# Patient Record
Sex: Male | Born: 1955 | Race: White | Hispanic: No | Marital: Single | Smoking: Former smoker
Health system: Southern US, Community
[De-identification: ages and names within clinical notes are randomized; demographics above are authoritative.]

## PROBLEM LIST (undated history)

## (undated) DIAGNOSIS — R519 Headache, unspecified: Secondary | ICD-10-CM

## (undated) DIAGNOSIS — IMO0002 Reserved for concepts with insufficient information to code with codable children: Secondary | ICD-10-CM

## (undated) DIAGNOSIS — E118 Type 2 diabetes mellitus with unspecified complications: Secondary | ICD-10-CM

## (undated) DIAGNOSIS — J42 Unspecified chronic bronchitis: Secondary | ICD-10-CM

## (undated) DIAGNOSIS — E785 Hyperlipidemia, unspecified: Secondary | ICD-10-CM

## (undated) DIAGNOSIS — H269 Unspecified cataract: Secondary | ICD-10-CM

## (undated) DIAGNOSIS — I10 Essential (primary) hypertension: Secondary | ICD-10-CM

## (undated) DIAGNOSIS — R51 Headache: Secondary | ICD-10-CM

## (undated) DIAGNOSIS — R9430 Abnormal result of cardiovascular function study, unspecified: Secondary | ICD-10-CM

## (undated) DIAGNOSIS — I219 Acute myocardial infarction, unspecified: Secondary | ICD-10-CM

## (undated) DIAGNOSIS — J45909 Unspecified asthma, uncomplicated: Secondary | ICD-10-CM

## (undated) DIAGNOSIS — K3184 Gastroparesis: Secondary | ICD-10-CM

## (undated) DIAGNOSIS — N529 Male erectile dysfunction, unspecified: Secondary | ICD-10-CM

## (undated) DIAGNOSIS — I2510 Atherosclerotic heart disease of native coronary artery without angina pectoris: Secondary | ICD-10-CM

## (undated) DIAGNOSIS — G4733 Obstructive sleep apnea (adult) (pediatric): Secondary | ICD-10-CM

## (undated) HISTORY — DX: Abnormal result of cardiovascular function study, unspecified: R94.30

## (undated) HISTORY — DX: Type 2 diabetes mellitus with unspecified complications: E11.8

## (undated) HISTORY — DX: Atherosclerotic heart disease of native coronary artery without angina pectoris: I25.10

## (undated) HISTORY — DX: Obstructive sleep apnea (adult) (pediatric): G47.33

## (undated) HISTORY — DX: Hyperlipidemia, unspecified: E78.5

## (undated) HISTORY — DX: Male erectile dysfunction, unspecified: N52.9

## (undated) HISTORY — PX: CORONARY ANGIOPLASTY WITH STENT PLACEMENT: SHX49

## (undated) HISTORY — DX: Essential (primary) hypertension: I10

## (undated) HISTORY — DX: Unspecified cataract: H26.9

## (undated) HISTORY — DX: Reserved for concepts with insufficient information to code with codable children: IMO0002

---

## 1997-12-23 ENCOUNTER — Encounter: Admission: RE | Admit: 1997-12-23 | Discharge: 1997-12-23 | Payer: Self-pay | Admitting: Internal Medicine

## 1998-01-06 ENCOUNTER — Encounter: Admission: RE | Admit: 1998-01-06 | Discharge: 1998-01-06 | Payer: Self-pay | Admitting: Internal Medicine

## 1998-03-06 ENCOUNTER — Encounter: Admission: RE | Admit: 1998-03-06 | Discharge: 1998-03-06 | Payer: Self-pay | Admitting: Internal Medicine

## 1998-03-24 ENCOUNTER — Ambulatory Visit (HOSPITAL_COMMUNITY): Admission: RE | Admit: 1998-03-24 | Discharge: 1998-03-24 | Payer: Self-pay | Admitting: *Deleted

## 1998-05-23 ENCOUNTER — Encounter: Admission: RE | Admit: 1998-05-23 | Discharge: 1998-05-23 | Payer: Self-pay | Admitting: Internal Medicine

## 1998-06-14 ENCOUNTER — Encounter: Admission: RE | Admit: 1998-06-14 | Discharge: 1998-06-14 | Payer: Self-pay | Admitting: Internal Medicine

## 1998-08-17 ENCOUNTER — Encounter: Admission: RE | Admit: 1998-08-17 | Discharge: 1998-08-17 | Payer: Self-pay | Admitting: Internal Medicine

## 1998-08-23 ENCOUNTER — Encounter: Admission: RE | Admit: 1998-08-23 | Discharge: 1998-08-23 | Payer: Self-pay | Admitting: Hematology and Oncology

## 1998-10-30 ENCOUNTER — Encounter: Admission: RE | Admit: 1998-10-30 | Discharge: 1998-10-30 | Payer: Self-pay | Admitting: Internal Medicine

## 1998-11-01 ENCOUNTER — Emergency Department (HOSPITAL_COMMUNITY): Admission: EM | Admit: 1998-11-01 | Discharge: 1998-11-01 | Payer: Self-pay | Admitting: Emergency Medicine

## 1998-11-08 ENCOUNTER — Ambulatory Visit (HOSPITAL_COMMUNITY): Admission: RE | Admit: 1998-11-08 | Discharge: 1998-11-08 | Payer: Self-pay | Admitting: Internal Medicine

## 1998-11-08 ENCOUNTER — Encounter: Admission: RE | Admit: 1998-11-08 | Discharge: 1998-11-08 | Payer: Self-pay | Admitting: Internal Medicine

## 1998-11-13 ENCOUNTER — Encounter: Admission: RE | Admit: 1998-11-13 | Discharge: 1998-11-13 | Payer: Self-pay | Admitting: Internal Medicine

## 1998-11-15 ENCOUNTER — Ambulatory Visit (HOSPITAL_COMMUNITY): Admission: RE | Admit: 1998-11-15 | Discharge: 1998-11-15 | Payer: Self-pay | Admitting: *Deleted

## 1999-02-12 ENCOUNTER — Encounter: Admission: RE | Admit: 1999-02-12 | Discharge: 1999-02-12 | Payer: Self-pay | Admitting: Internal Medicine

## 1999-08-06 ENCOUNTER — Encounter: Admission: RE | Admit: 1999-08-06 | Discharge: 1999-08-06 | Payer: Self-pay | Admitting: Internal Medicine

## 2000-02-06 ENCOUNTER — Encounter: Payer: Self-pay | Admitting: Emergency Medicine

## 2000-02-06 ENCOUNTER — Emergency Department (HOSPITAL_COMMUNITY): Admission: EM | Admit: 2000-02-06 | Discharge: 2000-02-06 | Payer: Self-pay | Admitting: Emergency Medicine

## 2000-04-18 ENCOUNTER — Encounter: Admission: RE | Admit: 2000-04-18 | Discharge: 2000-04-18 | Payer: Self-pay | Admitting: Internal Medicine

## 2000-04-30 ENCOUNTER — Encounter: Admission: RE | Admit: 2000-04-30 | Discharge: 2000-04-30 | Payer: Self-pay | Admitting: Internal Medicine

## 2000-08-29 ENCOUNTER — Encounter: Admission: RE | Admit: 2000-08-29 | Discharge: 2000-08-29 | Payer: Self-pay

## 2000-09-09 ENCOUNTER — Encounter: Admission: RE | Admit: 2000-09-09 | Discharge: 2000-09-09 | Payer: Self-pay | Admitting: Internal Medicine

## 2000-09-12 ENCOUNTER — Encounter: Admission: RE | Admit: 2000-09-12 | Discharge: 2000-09-12 | Payer: Self-pay | Admitting: Internal Medicine

## 2000-10-13 ENCOUNTER — Encounter: Admission: RE | Admit: 2000-10-13 | Discharge: 2000-10-13 | Payer: Self-pay | Admitting: Internal Medicine

## 2000-10-29 ENCOUNTER — Encounter: Admission: RE | Admit: 2000-10-29 | Discharge: 2000-10-29 | Payer: Self-pay | Admitting: Hematology and Oncology

## 2000-12-15 ENCOUNTER — Encounter: Admission: RE | Admit: 2000-12-15 | Discharge: 2000-12-15 | Payer: Self-pay | Admitting: Internal Medicine

## 2001-07-22 ENCOUNTER — Emergency Department (HOSPITAL_COMMUNITY): Admission: EM | Admit: 2001-07-22 | Discharge: 2001-07-22 | Payer: Self-pay | Admitting: Emergency Medicine

## 2001-07-22 ENCOUNTER — Encounter: Payer: Self-pay | Admitting: Emergency Medicine

## 2001-12-03 ENCOUNTER — Encounter: Admission: RE | Admit: 2001-12-03 | Discharge: 2001-12-03 | Payer: Self-pay

## 2001-12-22 ENCOUNTER — Encounter: Admission: RE | Admit: 2001-12-22 | Discharge: 2001-12-22 | Payer: Self-pay | Admitting: Internal Medicine

## 2002-05-07 ENCOUNTER — Encounter: Admission: RE | Admit: 2002-05-07 | Discharge: 2002-05-07 | Payer: Self-pay | Admitting: Internal Medicine

## 2002-12-09 ENCOUNTER — Ambulatory Visit (HOSPITAL_COMMUNITY): Admission: RE | Admit: 2002-12-09 | Discharge: 2002-12-09 | Payer: Self-pay | Admitting: Internal Medicine

## 2002-12-09 ENCOUNTER — Encounter: Admission: RE | Admit: 2002-12-09 | Discharge: 2002-12-09 | Payer: Self-pay | Admitting: Internal Medicine

## 2002-12-09 ENCOUNTER — Encounter: Payer: Self-pay | Admitting: Internal Medicine

## 2002-12-10 ENCOUNTER — Inpatient Hospital Stay (HOSPITAL_COMMUNITY): Admission: AD | Admit: 2002-12-10 | Discharge: 2002-12-15 | Payer: Self-pay | Admitting: Internal Medicine

## 2002-12-10 ENCOUNTER — Encounter: Admission: RE | Admit: 2002-12-10 | Discharge: 2002-12-10 | Payer: Self-pay | Admitting: Internal Medicine

## 2002-12-10 ENCOUNTER — Encounter: Payer: Self-pay | Admitting: Internal Medicine

## 2002-12-11 ENCOUNTER — Encounter: Payer: Self-pay | Admitting: Internal Medicine

## 2002-12-14 ENCOUNTER — Encounter: Payer: Self-pay | Admitting: Internal Medicine

## 2002-12-22 ENCOUNTER — Encounter: Admission: RE | Admit: 2002-12-22 | Discharge: 2002-12-22 | Payer: Self-pay | Admitting: Internal Medicine

## 2003-01-28 ENCOUNTER — Encounter: Admission: RE | Admit: 2003-01-28 | Discharge: 2003-01-28 | Payer: Self-pay | Admitting: Internal Medicine

## 2003-04-28 ENCOUNTER — Encounter: Admission: RE | Admit: 2003-04-28 | Discharge: 2003-04-28 | Payer: Self-pay | Admitting: Internal Medicine

## 2003-05-13 ENCOUNTER — Encounter: Admission: RE | Admit: 2003-05-13 | Discharge: 2003-05-13 | Payer: Self-pay | Admitting: Internal Medicine

## 2003-06-16 ENCOUNTER — Encounter: Admission: RE | Admit: 2003-06-16 | Discharge: 2003-06-16 | Payer: Self-pay | Admitting: Internal Medicine

## 2003-07-01 ENCOUNTER — Encounter: Admission: RE | Admit: 2003-07-01 | Discharge: 2003-07-01 | Payer: Self-pay | Admitting: Internal Medicine

## 2003-07-27 ENCOUNTER — Encounter: Admission: RE | Admit: 2003-07-27 | Discharge: 2003-07-27 | Payer: Self-pay | Admitting: Internal Medicine

## 2003-08-26 ENCOUNTER — Encounter: Admission: RE | Admit: 2003-08-26 | Discharge: 2003-08-26 | Payer: Self-pay | Admitting: Internal Medicine

## 2003-11-10 ENCOUNTER — Encounter: Admission: RE | Admit: 2003-11-10 | Discharge: 2003-11-10 | Payer: Self-pay | Admitting: Internal Medicine

## 2004-02-09 ENCOUNTER — Encounter: Admission: RE | Admit: 2004-02-09 | Discharge: 2004-02-09 | Payer: Self-pay | Admitting: Internal Medicine

## 2004-05-09 ENCOUNTER — Ambulatory Visit (HOSPITAL_COMMUNITY): Admission: RE | Admit: 2004-05-09 | Discharge: 2004-05-09 | Payer: Self-pay | Admitting: Internal Medicine

## 2004-05-09 ENCOUNTER — Ambulatory Visit: Payer: Self-pay | Admitting: Internal Medicine

## 2004-07-04 ENCOUNTER — Ambulatory Visit: Payer: Self-pay | Admitting: Internal Medicine

## 2004-07-12 ENCOUNTER — Emergency Department (HOSPITAL_COMMUNITY): Admission: EM | Admit: 2004-07-12 | Discharge: 2004-07-12 | Payer: Self-pay | Admitting: Family Medicine

## 2004-08-19 DIAGNOSIS — I219 Acute myocardial infarction, unspecified: Secondary | ICD-10-CM

## 2004-08-19 HISTORY — DX: Acute myocardial infarction, unspecified: I21.9

## 2004-09-25 ENCOUNTER — Ambulatory Visit: Payer: Self-pay | Admitting: Internal Medicine

## 2004-12-12 ENCOUNTER — Ambulatory Visit: Payer: Self-pay | Admitting: Internal Medicine

## 2004-12-27 ENCOUNTER — Ambulatory Visit: Payer: Self-pay | Admitting: Internal Medicine

## 2005-01-21 ENCOUNTER — Ambulatory Visit (HOSPITAL_COMMUNITY): Admission: RE | Admit: 2005-01-21 | Discharge: 2005-01-21 | Payer: Self-pay | Admitting: Internal Medicine

## 2005-01-21 ENCOUNTER — Ambulatory Visit: Payer: Self-pay | Admitting: Internal Medicine

## 2005-01-28 ENCOUNTER — Ambulatory Visit: Payer: Self-pay | Admitting: Internal Medicine

## 2005-03-13 ENCOUNTER — Ambulatory Visit: Payer: Self-pay | Admitting: Internal Medicine

## 2005-03-29 ENCOUNTER — Ambulatory Visit: Payer: Self-pay | Admitting: Internal Medicine

## 2005-04-10 ENCOUNTER — Ambulatory Visit: Payer: Self-pay | Admitting: Internal Medicine

## 2005-05-06 ENCOUNTER — Encounter (INDEPENDENT_AMBULATORY_CARE_PROVIDER_SITE_OTHER): Payer: Self-pay | Admitting: *Deleted

## 2005-05-06 ENCOUNTER — Ambulatory Visit: Payer: Self-pay | Admitting: Internal Medicine

## 2005-05-06 LAB — CONVERTED CEMR LAB: C-Peptide: 1.61 ng/mL

## 2005-08-16 ENCOUNTER — Inpatient Hospital Stay (HOSPITAL_COMMUNITY): Admission: EM | Admit: 2005-08-16 | Discharge: 2005-08-20 | Payer: Self-pay | Admitting: Emergency Medicine

## 2005-09-20 ENCOUNTER — Ambulatory Visit (HOSPITAL_COMMUNITY): Admission: RE | Admit: 2005-09-20 | Discharge: 2005-09-20 | Payer: Self-pay | Admitting: Cardiology

## 2005-10-27 ENCOUNTER — Emergency Department (HOSPITAL_COMMUNITY): Admission: EM | Admit: 2005-10-27 | Discharge: 2005-10-27 | Payer: Self-pay | Admitting: Family Medicine

## 2005-12-26 ENCOUNTER — Ambulatory Visit: Payer: Self-pay | Admitting: Internal Medicine

## 2006-01-15 ENCOUNTER — Ambulatory Visit: Payer: Self-pay | Admitting: Internal Medicine

## 2006-03-24 ENCOUNTER — Ambulatory Visit (HOSPITAL_COMMUNITY): Admission: RE | Admit: 2006-03-24 | Discharge: 2006-03-24 | Payer: Self-pay | Admitting: Cardiology

## 2006-04-02 ENCOUNTER — Ambulatory Visit: Payer: Self-pay | Admitting: Internal Medicine

## 2006-04-02 ENCOUNTER — Ambulatory Visit (HOSPITAL_COMMUNITY): Admission: RE | Admit: 2006-04-02 | Discharge: 2006-04-02 | Payer: Self-pay | Admitting: Internal Medicine

## 2006-05-09 ENCOUNTER — Encounter (INDEPENDENT_AMBULATORY_CARE_PROVIDER_SITE_OTHER): Payer: Self-pay | Admitting: Internal Medicine

## 2006-05-12 ENCOUNTER — Ambulatory Visit: Payer: Self-pay | Admitting: Internal Medicine

## 2006-05-21 ENCOUNTER — Ambulatory Visit: Payer: Self-pay | Admitting: Internal Medicine

## 2006-06-03 DIAGNOSIS — S0990XA Unspecified injury of head, initial encounter: Secondary | ICD-10-CM | POA: Insufficient documentation

## 2006-06-03 DIAGNOSIS — E1143 Type 2 diabetes mellitus with diabetic autonomic (poly)neuropathy: Secondary | ICD-10-CM | POA: Insufficient documentation

## 2006-06-25 ENCOUNTER — Encounter (INDEPENDENT_AMBULATORY_CARE_PROVIDER_SITE_OTHER): Payer: Self-pay | Admitting: Internal Medicine

## 2006-06-25 ENCOUNTER — Ambulatory Visit: Payer: Self-pay | Admitting: Hospitalist

## 2006-06-25 LAB — CONVERTED CEMR LAB
HCV Ab: NEGATIVE
Hep B Core Total Ab: NEGATIVE
Hep B S Ab: NEGATIVE
Hepatitis B Surface Ag: NEGATIVE
Testosterone: 310.19 ng/dL — ABNORMAL LOW (ref 350–890)

## 2006-06-30 ENCOUNTER — Encounter (INDEPENDENT_AMBULATORY_CARE_PROVIDER_SITE_OTHER): Payer: Self-pay | Admitting: Internal Medicine

## 2006-06-30 LAB — CONVERTED CEMR LAB: Sex Hormone Binding: 27 nmol/L (ref 13–71)

## 2006-07-28 DIAGNOSIS — J309 Allergic rhinitis, unspecified: Secondary | ICD-10-CM | POA: Insufficient documentation

## 2006-07-28 DIAGNOSIS — N529 Male erectile dysfunction, unspecified: Secondary | ICD-10-CM | POA: Insufficient documentation

## 2006-09-04 ENCOUNTER — Ambulatory Visit: Payer: Self-pay | Admitting: Hospitalist

## 2006-09-04 LAB — CONVERTED CEMR LAB: Hgb A1c MFr Bld: 7.7 %

## 2006-09-09 ENCOUNTER — Encounter (INDEPENDENT_AMBULATORY_CARE_PROVIDER_SITE_OTHER): Payer: Self-pay | Admitting: Internal Medicine

## 2006-09-09 ENCOUNTER — Ambulatory Visit: Payer: Self-pay | Admitting: Internal Medicine

## 2006-09-09 LAB — CONVERTED CEMR LAB
Albumin: 4.3 g/dL (ref 3.5–5.2)
Alkaline Phosphatase: 103 units/L (ref 39–117)
BUN: 13 mg/dL (ref 6–23)
FSH: 15.4 milliintl units/mL (ref 1.4–18.1)
HDL: 43 mg/dL (ref >39)
Potassium: 4.4 meq/L (ref 3.5–5.3)
Total Bilirubin: 0.5 mg/dL (ref 0.3–1.2)
Total Protein: 6.8 g/dL (ref 6.0–8.3)
Triglycerides: 119 mg/dL (ref <150)

## 2006-10-02 ENCOUNTER — Ambulatory Visit: Payer: Self-pay | Admitting: Hospitalist

## 2006-10-02 DIAGNOSIS — J329 Chronic sinusitis, unspecified: Secondary | ICD-10-CM | POA: Insufficient documentation

## 2006-10-02 LAB — CONVERTED CEMR LAB: Blood Glucose, Fingerstick: 168

## 2006-12-19 ENCOUNTER — Ambulatory Visit: Payer: Self-pay | Admitting: Internal Medicine

## 2006-12-19 ENCOUNTER — Encounter (INDEPENDENT_AMBULATORY_CARE_PROVIDER_SITE_OTHER): Payer: Self-pay | Admitting: Internal Medicine

## 2006-12-19 LAB — CONVERTED CEMR LAB: Hgb A1c MFr Bld: 8.3 %

## 2006-12-22 LAB — CONVERTED CEMR LAB
AST: 27 units/L (ref 0–37)
Albumin: 4.3 g/dL (ref 3.5–5.2)
Alkaline Phosphatase: 111 units/L (ref 39–117)
CO2: 25 meq/L (ref 19–32)
Calcium: 9.3 mg/dL (ref 8.4–10.5)
Glucose, Bld: 115 mg/dL — ABNORMAL HIGH (ref 70–99)
Ketones, ur: NEGATIVE mg/dL
LDL Cholesterol: 92 mg/dL (ref 0–99)
Nitrite: NEGATIVE
Potassium: 4.2 meq/L (ref 3.5–5.3)
Sodium: 140 meq/L (ref 135–145)
Total Bilirubin: 0.5 mg/dL (ref 0.3–1.2)
Total Protein: 7.1 g/dL (ref 6.0–8.3)
Urine Glucose: 100 mg/dL — AB
VLDL: 23 mg/dL (ref 0–40)
pH: 6 (ref 5.0–8.0)

## 2006-12-24 ENCOUNTER — Ambulatory Visit: Payer: Self-pay | Admitting: Internal Medicine

## 2006-12-24 DIAGNOSIS — J45901 Unspecified asthma with (acute) exacerbation: Secondary | ICD-10-CM | POA: Insufficient documentation

## 2006-12-24 LAB — CONVERTED CEMR LAB: Blood Glucose, Fingerstick: 230

## 2006-12-25 ENCOUNTER — Emergency Department (HOSPITAL_COMMUNITY): Admission: EM | Admit: 2006-12-25 | Discharge: 2006-12-25 | Payer: Self-pay | Admitting: Emergency Medicine

## 2007-01-05 ENCOUNTER — Ambulatory Visit: Payer: Self-pay | Admitting: Hospitalist

## 2007-02-11 ENCOUNTER — Inpatient Hospital Stay (HOSPITAL_COMMUNITY): Admission: EM | Admit: 2007-02-11 | Discharge: 2007-02-13 | Payer: Self-pay | Admitting: Emergency Medicine

## 2007-02-11 ENCOUNTER — Ambulatory Visit: Payer: Self-pay | Admitting: Hospitalist

## 2007-02-12 ENCOUNTER — Encounter (INDEPENDENT_AMBULATORY_CARE_PROVIDER_SITE_OTHER): Payer: Self-pay | Admitting: Hospitalist

## 2007-02-18 ENCOUNTER — Telehealth: Payer: Self-pay | Admitting: *Deleted

## 2007-03-24 ENCOUNTER — Telehealth (INDEPENDENT_AMBULATORY_CARE_PROVIDER_SITE_OTHER): Payer: Self-pay | Admitting: *Deleted

## 2007-03-24 ENCOUNTER — Encounter (INDEPENDENT_AMBULATORY_CARE_PROVIDER_SITE_OTHER): Payer: Self-pay | Admitting: *Deleted

## 2007-03-24 ENCOUNTER — Ambulatory Visit: Payer: Self-pay | Admitting: *Deleted

## 2007-03-24 LAB — CONVERTED CEMR LAB: Blood Glucose, Fingerstick: 294

## 2007-03-25 LAB — CONVERTED CEMR LAB

## 2007-04-09 ENCOUNTER — Telehealth: Payer: Self-pay | Admitting: *Deleted

## 2007-04-14 ENCOUNTER — Telehealth: Payer: Self-pay | Admitting: Infectious Disease

## 2007-04-24 ENCOUNTER — Ambulatory Visit: Payer: Self-pay | Admitting: Hospitalist

## 2007-04-24 ENCOUNTER — Encounter (INDEPENDENT_AMBULATORY_CARE_PROVIDER_SITE_OTHER): Payer: Self-pay | Admitting: *Deleted

## 2007-04-24 LAB — CONVERTED CEMR LAB
ALT: 51 units/L (ref 0–53)
AST: 30 units/L (ref 0–37)
BUN: 12 mg/dL (ref 6–23)
Calcium: 9.6 mg/dL (ref 8.4–10.5)
Creatinine, Ser: 0.87 mg/dL (ref 0.40–1.50)
Glucose, Bld: 36 mg/dL — CL (ref 70–99)
Potassium: 4.1 meq/L (ref 3.5–5.3)
Sodium: 139 meq/L (ref 135–145)

## 2007-05-07 ENCOUNTER — Telehealth: Payer: Self-pay | Admitting: *Deleted

## 2007-05-25 ENCOUNTER — Telehealth (INDEPENDENT_AMBULATORY_CARE_PROVIDER_SITE_OTHER): Payer: Self-pay | Admitting: *Deleted

## 2007-06-03 ENCOUNTER — Encounter (INDEPENDENT_AMBULATORY_CARE_PROVIDER_SITE_OTHER): Payer: Self-pay | Admitting: *Deleted

## 2007-06-16 ENCOUNTER — Encounter (INDEPENDENT_AMBULATORY_CARE_PROVIDER_SITE_OTHER): Payer: Self-pay | Admitting: *Deleted

## 2007-06-16 ENCOUNTER — Ambulatory Visit: Payer: Self-pay | Admitting: Internal Medicine

## 2007-06-16 LAB — CONVERTED CEMR LAB: Hgb A1c MFr Bld: 8.3 %

## 2007-06-23 ENCOUNTER — Ambulatory Visit: Payer: Self-pay | Admitting: *Deleted

## 2007-06-23 LAB — CONVERTED CEMR LAB: Blood Glucose, Fingerstick: 134

## 2007-07-01 ENCOUNTER — Telehealth (INDEPENDENT_AMBULATORY_CARE_PROVIDER_SITE_OTHER): Payer: Self-pay | Admitting: *Deleted

## 2007-07-09 ENCOUNTER — Telehealth: Payer: Self-pay | Admitting: *Deleted

## 2007-07-23 ENCOUNTER — Telehealth: Payer: Self-pay | Admitting: *Deleted

## 2007-07-24 ENCOUNTER — Ambulatory Visit: Payer: Self-pay | Admitting: Hospitalist

## 2007-07-24 ENCOUNTER — Ambulatory Visit (HOSPITAL_COMMUNITY): Admission: RE | Admit: 2007-07-24 | Discharge: 2007-07-24 | Payer: Self-pay | Admitting: Hospitalist

## 2007-07-24 ENCOUNTER — Ambulatory Visit: Payer: Self-pay | Admitting: Infectious Diseases

## 2007-07-24 ENCOUNTER — Encounter (INDEPENDENT_AMBULATORY_CARE_PROVIDER_SITE_OTHER): Payer: Self-pay | Admitting: *Deleted

## 2007-07-24 LAB — CONVERTED CEMR LAB
Blood Glucose, Fingerstick: 134
Cholesterol: 143 mg/dL (ref 0–200)
Creatinine, Ser: 1.02 mg/dL (ref 0.40–1.50)
Glucose, Bld: 132 mg/dL — ABNORMAL HIGH (ref 70–99)
MCHC: 34.3 g/dL (ref 30.0–36.0)
MCV: 83.4 fL (ref 78.0–100.0)
Potassium: 4.4 meq/L (ref 3.5–5.3)
Sodium: 143 meq/L (ref 135–145)
VLDL: 20 mg/dL (ref 0–40)

## 2007-07-27 ENCOUNTER — Telehealth (INDEPENDENT_AMBULATORY_CARE_PROVIDER_SITE_OTHER): Payer: Self-pay | Admitting: *Deleted

## 2007-07-29 ENCOUNTER — Encounter (INDEPENDENT_AMBULATORY_CARE_PROVIDER_SITE_OTHER): Payer: Self-pay | Admitting: *Deleted

## 2007-07-29 ENCOUNTER — Ambulatory Visit: Payer: Self-pay | Admitting: Internal Medicine

## 2007-08-03 ENCOUNTER — Encounter (INDEPENDENT_AMBULATORY_CARE_PROVIDER_SITE_OTHER): Payer: Self-pay | Admitting: *Deleted

## 2007-08-04 ENCOUNTER — Telehealth (INDEPENDENT_AMBULATORY_CARE_PROVIDER_SITE_OTHER): Payer: Self-pay | Admitting: *Deleted

## 2007-10-19 ENCOUNTER — Telehealth: Payer: Self-pay | Admitting: Infectious Diseases

## 2007-10-21 ENCOUNTER — Encounter (INDEPENDENT_AMBULATORY_CARE_PROVIDER_SITE_OTHER): Payer: Self-pay | Admitting: *Deleted

## 2007-10-21 ENCOUNTER — Ambulatory Visit: Payer: Self-pay | Admitting: Internal Medicine

## 2007-10-21 LAB — CONVERTED CEMR LAB
Blood Glucose, Fingerstick: 133
Hgb A1c MFr Bld: 8.3 %

## 2007-10-22 ENCOUNTER — Encounter (INDEPENDENT_AMBULATORY_CARE_PROVIDER_SITE_OTHER): Payer: Self-pay | Admitting: *Deleted

## 2007-11-03 ENCOUNTER — Telehealth (INDEPENDENT_AMBULATORY_CARE_PROVIDER_SITE_OTHER): Payer: Self-pay | Admitting: *Deleted

## 2007-11-04 ENCOUNTER — Ambulatory Visit (HOSPITAL_COMMUNITY): Admission: RE | Admit: 2007-11-04 | Discharge: 2007-11-04 | Payer: Self-pay | Admitting: Hospitalist

## 2007-11-04 ENCOUNTER — Telehealth: Payer: Self-pay | Admitting: *Deleted

## 2007-11-04 ENCOUNTER — Encounter (INDEPENDENT_AMBULATORY_CARE_PROVIDER_SITE_OTHER): Payer: Self-pay | Admitting: Internal Medicine

## 2007-11-04 ENCOUNTER — Ambulatory Visit: Payer: Self-pay | Admitting: Internal Medicine

## 2007-11-04 LAB — CONVERTED CEMR LAB
BUN: 7 mg/dL (ref 6–23)
Basophils Absolute: 0 10*3/uL (ref 0.0–0.1)
CO2: 28 meq/L (ref 19–32)
Chloride: 103 meq/L (ref 96–112)
Creatinine, Ser: 0.93 mg/dL (ref 0.40–1.50)
Eosinophils Relative: 4 % (ref 0–5)
HCT: 42.3 % (ref 39.0–52.0)
Hemoglobin: 14.4 g/dL (ref 13.0–17.0)
Lymphs Abs: 0.9 10*3/uL (ref 0.7–4.0)
MCHC: 34.1 g/dL (ref 30.0–36.0)
MCV: 85.3 fL (ref 78.0–100.0)
Monocytes Relative: 9 % (ref 3–12)
RBC: 4.96 M/uL (ref 4.22–5.81)
Sodium: 139 meq/L (ref 135–145)
WBC: 8.1 10*3/uL (ref 4.0–10.5)

## 2007-11-06 ENCOUNTER — Ambulatory Visit: Payer: Self-pay | Admitting: Hospitalist

## 2007-11-06 ENCOUNTER — Encounter (INDEPENDENT_AMBULATORY_CARE_PROVIDER_SITE_OTHER): Payer: Self-pay | Admitting: Internal Medicine

## 2007-11-06 LAB — CONVERTED CEMR LAB: Streptococcus, Group A Screen (Direct): NEGATIVE

## 2007-11-07 ENCOUNTER — Encounter (INDEPENDENT_AMBULATORY_CARE_PROVIDER_SITE_OTHER): Payer: Self-pay | Admitting: Internal Medicine

## 2007-11-09 ENCOUNTER — Encounter (INDEPENDENT_AMBULATORY_CARE_PROVIDER_SITE_OTHER): Payer: Self-pay | Admitting: *Deleted

## 2007-11-16 ENCOUNTER — Telehealth: Payer: Self-pay | Admitting: *Deleted

## 2007-11-23 ENCOUNTER — Ambulatory Visit: Payer: Self-pay | Admitting: Internal Medicine

## 2007-11-23 ENCOUNTER — Encounter (INDEPENDENT_AMBULATORY_CARE_PROVIDER_SITE_OTHER): Payer: Self-pay | Admitting: Internal Medicine

## 2007-11-23 LAB — CONVERTED CEMR LAB
BUN: 11 mg/dL (ref 6–23)
Blood Glucose, Fingerstick: 192
Blood Glucose, Fingerstick: 260
Calcium: 9.1 mg/dL (ref 8.4–10.5)
Creatinine, Ser: 0.9 mg/dL (ref 0.40–1.50)
Glucose, Bld: 146 mg/dL — ABNORMAL HIGH (ref 70–99)

## 2007-11-24 ENCOUNTER — Encounter (INDEPENDENT_AMBULATORY_CARE_PROVIDER_SITE_OTHER): Payer: Self-pay | Admitting: *Deleted

## 2007-12-07 ENCOUNTER — Ambulatory Visit: Payer: Self-pay | Admitting: Infectious Disease

## 2007-12-07 ENCOUNTER — Encounter (INDEPENDENT_AMBULATORY_CARE_PROVIDER_SITE_OTHER): Payer: Self-pay | Admitting: Internal Medicine

## 2007-12-07 LAB — CONVERTED CEMR LAB
Blood Glucose, Fingerstick: 222
CO2: 24 meq/L (ref 19–32)
Calcium: 9.4 mg/dL (ref 8.4–10.5)
Creatinine, Ser: 0.97 mg/dL (ref 0.40–1.50)
Sodium: 139 meq/L (ref 135–145)

## 2008-01-04 ENCOUNTER — Encounter (INDEPENDENT_AMBULATORY_CARE_PROVIDER_SITE_OTHER): Payer: Self-pay | Admitting: *Deleted

## 2008-01-04 ENCOUNTER — Ambulatory Visit: Payer: Self-pay | Admitting: *Deleted

## 2008-01-08 LAB — CONVERTED CEMR LAB
Chloride: 106 meq/L (ref 96–112)
Creatinine, Ser: 1.19 mg/dL (ref 0.40–1.50)
Potassium: 4 meq/L (ref 3.5–5.3)
Sodium: 142 meq/L (ref 135–145)

## 2008-03-07 ENCOUNTER — Telehealth (INDEPENDENT_AMBULATORY_CARE_PROVIDER_SITE_OTHER): Payer: Self-pay | Admitting: Internal Medicine

## 2008-03-17 ENCOUNTER — Ambulatory Visit: Payer: Self-pay | Admitting: Internal Medicine

## 2008-04-04 ENCOUNTER — Ambulatory Visit: Payer: Self-pay | Admitting: *Deleted

## 2008-04-04 LAB — CONVERTED CEMR LAB: Blood Glucose, Fingerstick: 243

## 2008-04-08 ENCOUNTER — Encounter (INDEPENDENT_AMBULATORY_CARE_PROVIDER_SITE_OTHER): Payer: Self-pay | Admitting: Internal Medicine

## 2008-04-08 ENCOUNTER — Ambulatory Visit: Payer: Self-pay | Admitting: *Deleted

## 2008-04-08 LAB — CONVERTED CEMR LAB: Hgb A1c MFr Bld: 9.1 %

## 2008-04-11 LAB — CONVERTED CEMR LAB
ALT: 38 units/L (ref 0–53)
Alkaline Phosphatase: 108 units/L (ref 39–117)
CO2: 22 meq/L (ref 19–32)
Cholesterol: 151 mg/dL (ref 0–200)
Creatinine, Ser: 1.13 mg/dL (ref 0.40–1.50)
Microalb Creat Ratio: 3.2 mg/g (ref 0.0–30.0)
Microalb, Ur: 0.76 mg/dL (ref 0.00–1.89)
Total Bilirubin: 0.6 mg/dL (ref 0.3–1.2)
Total CHOL/HDL Ratio: 4.2
Triglycerides: 122 mg/dL (ref <150)
VLDL: 24 mg/dL (ref 0–40)

## 2008-05-03 ENCOUNTER — Telehealth (INDEPENDENT_AMBULATORY_CARE_PROVIDER_SITE_OTHER): Payer: Self-pay | Admitting: *Deleted

## 2008-05-04 ENCOUNTER — Ambulatory Visit: Payer: Self-pay | Admitting: Internal Medicine

## 2008-05-17 ENCOUNTER — Telehealth (INDEPENDENT_AMBULATORY_CARE_PROVIDER_SITE_OTHER): Payer: Self-pay | Admitting: *Deleted

## 2008-06-01 ENCOUNTER — Telehealth: Payer: Self-pay | Admitting: Infectious Disease

## 2008-06-14 ENCOUNTER — Telehealth (INDEPENDENT_AMBULATORY_CARE_PROVIDER_SITE_OTHER): Payer: Self-pay | Admitting: Internal Medicine

## 2008-06-16 ENCOUNTER — Telehealth (INDEPENDENT_AMBULATORY_CARE_PROVIDER_SITE_OTHER): Payer: Self-pay | Admitting: Internal Medicine

## 2008-06-28 ENCOUNTER — Telehealth (INDEPENDENT_AMBULATORY_CARE_PROVIDER_SITE_OTHER): Payer: Self-pay | Admitting: Internal Medicine

## 2008-07-20 ENCOUNTER — Ambulatory Visit: Payer: Self-pay | Admitting: Internal Medicine

## 2008-07-20 ENCOUNTER — Telehealth (INDEPENDENT_AMBULATORY_CARE_PROVIDER_SITE_OTHER): Payer: Self-pay | Admitting: *Deleted

## 2008-07-20 LAB — CONVERTED CEMR LAB: Hgb A1c MFr Bld: 9.1 %

## 2008-07-26 ENCOUNTER — Encounter (INDEPENDENT_AMBULATORY_CARE_PROVIDER_SITE_OTHER): Payer: Self-pay | Admitting: Internal Medicine

## 2008-07-26 ENCOUNTER — Ambulatory Visit: Payer: Self-pay | Admitting: Internal Medicine

## 2008-08-03 ENCOUNTER — Telehealth (INDEPENDENT_AMBULATORY_CARE_PROVIDER_SITE_OTHER): Payer: Self-pay | Admitting: Internal Medicine

## 2008-08-29 ENCOUNTER — Telehealth (INDEPENDENT_AMBULATORY_CARE_PROVIDER_SITE_OTHER): Payer: Self-pay | Admitting: Internal Medicine

## 2008-09-14 ENCOUNTER — Encounter (INDEPENDENT_AMBULATORY_CARE_PROVIDER_SITE_OTHER): Payer: Self-pay | Admitting: Internal Medicine

## 2008-09-14 ENCOUNTER — Ambulatory Visit: Payer: Self-pay | Admitting: Internal Medicine

## 2008-09-16 LAB — CONVERTED CEMR LAB
Albumin: 4.1 g/dL (ref 3.5–5.2)
CO2: 28 meq/L (ref 19–32)
Glucose, Bld: 108 mg/dL — ABNORMAL HIGH (ref 70–99)
Potassium: 4.4 meq/L (ref 3.5–5.3)
Sodium: 143 meq/L (ref 135–145)
TSH: 3.693 microintl units/mL (ref 0.350–4.50)
Total Protein: 6.8 g/dL (ref 6.0–8.3)
Vitamin B-12: 416 pg/mL (ref 211–911)

## 2008-09-22 ENCOUNTER — Ambulatory Visit: Payer: Self-pay | Admitting: Cardiology

## 2008-09-28 ENCOUNTER — Ambulatory Visit: Payer: Self-pay | Admitting: Internal Medicine

## 2008-10-04 ENCOUNTER — Telehealth (INDEPENDENT_AMBULATORY_CARE_PROVIDER_SITE_OTHER): Payer: Self-pay | Admitting: Internal Medicine

## 2008-10-17 ENCOUNTER — Telehealth (INDEPENDENT_AMBULATORY_CARE_PROVIDER_SITE_OTHER): Payer: Self-pay | Admitting: *Deleted

## 2008-11-02 ENCOUNTER — Encounter (INDEPENDENT_AMBULATORY_CARE_PROVIDER_SITE_OTHER): Payer: Self-pay | Admitting: Internal Medicine

## 2008-11-07 ENCOUNTER — Encounter (INDEPENDENT_AMBULATORY_CARE_PROVIDER_SITE_OTHER): Payer: Self-pay | Admitting: Internal Medicine

## 2008-11-09 ENCOUNTER — Encounter (INDEPENDENT_AMBULATORY_CARE_PROVIDER_SITE_OTHER): Payer: Self-pay | Admitting: Internal Medicine

## 2008-11-14 ENCOUNTER — Ambulatory Visit: Payer: Self-pay | Admitting: Internal Medicine

## 2008-11-15 ENCOUNTER — Encounter (INDEPENDENT_AMBULATORY_CARE_PROVIDER_SITE_OTHER): Payer: Self-pay | Admitting: Internal Medicine

## 2008-11-15 ENCOUNTER — Ambulatory Visit: Payer: Self-pay | Admitting: Internal Medicine

## 2008-11-15 ENCOUNTER — Telehealth: Payer: Self-pay | Admitting: *Deleted

## 2008-11-15 LAB — CONVERTED CEMR LAB
CO2: 25 meq/L (ref 19–32)
Creatinine, Ser: 0.94 mg/dL (ref 0.40–1.50)
GFR calc Af Amer: >60 mL/min (ref >60)
GFR calc non Af Amer: >60 mL/min (ref >60)
Glucose, Bld: 126 mg/dL — ABNORMAL HIGH (ref 70–99)
HDL: 42 mg/dL (ref >39)
Total Bilirubin: 0.5 mg/dL (ref 0.3–1.2)
Total Protein: 6.7 g/dL (ref 6.0–8.3)
Triglycerides: 77 mg/dL (ref <150)

## 2008-11-16 ENCOUNTER — Encounter (INDEPENDENT_AMBULATORY_CARE_PROVIDER_SITE_OTHER): Payer: Self-pay | Admitting: Internal Medicine

## 2008-11-17 ENCOUNTER — Telehealth (INDEPENDENT_AMBULATORY_CARE_PROVIDER_SITE_OTHER): Payer: Self-pay | Admitting: Internal Medicine

## 2008-11-25 ENCOUNTER — Telehealth (INDEPENDENT_AMBULATORY_CARE_PROVIDER_SITE_OTHER): Payer: Self-pay | Admitting: Internal Medicine

## 2008-11-28 ENCOUNTER — Telehealth (INDEPENDENT_AMBULATORY_CARE_PROVIDER_SITE_OTHER): Payer: Self-pay | Admitting: Internal Medicine

## 2008-12-12 ENCOUNTER — Encounter (INDEPENDENT_AMBULATORY_CARE_PROVIDER_SITE_OTHER): Payer: Self-pay | Admitting: Internal Medicine

## 2008-12-28 ENCOUNTER — Encounter (INDEPENDENT_AMBULATORY_CARE_PROVIDER_SITE_OTHER): Payer: Self-pay | Admitting: Internal Medicine

## 2008-12-28 ENCOUNTER — Ambulatory Visit: Payer: Self-pay | Admitting: *Deleted

## 2008-12-28 DIAGNOSIS — M25519 Pain in unspecified shoulder: Secondary | ICD-10-CM | POA: Insufficient documentation

## 2008-12-28 LAB — CONVERTED CEMR LAB
Blood Glucose, Fingerstick: 258
CO2: 27 meq/L (ref 19–32)
Chloride: 104 meq/L (ref 96–112)
Creatinine, Ser: 0.9 mg/dL (ref 0.40–1.50)

## 2009-01-06 ENCOUNTER — Encounter (INDEPENDENT_AMBULATORY_CARE_PROVIDER_SITE_OTHER): Payer: Self-pay | Admitting: *Deleted

## 2009-01-24 ENCOUNTER — Telehealth (INDEPENDENT_AMBULATORY_CARE_PROVIDER_SITE_OTHER): Payer: Self-pay | Admitting: Internal Medicine

## 2009-01-27 ENCOUNTER — Ambulatory Visit: Payer: Self-pay | Admitting: Family Medicine

## 2009-01-27 DIAGNOSIS — M719 Bursopathy, unspecified: Secondary | ICD-10-CM

## 2009-01-27 DIAGNOSIS — M67919 Unspecified disorder of synovium and tendon, unspecified shoulder: Secondary | ICD-10-CM | POA: Insufficient documentation

## 2009-02-02 ENCOUNTER — Telehealth (INDEPENDENT_AMBULATORY_CARE_PROVIDER_SITE_OTHER): Payer: Self-pay | Admitting: Internal Medicine

## 2009-02-24 ENCOUNTER — Telehealth: Payer: Self-pay | Admitting: *Deleted

## 2009-02-24 ENCOUNTER — Ambulatory Visit: Payer: Self-pay | Admitting: Internal Medicine

## 2009-02-24 DIAGNOSIS — H6120 Impacted cerumen, unspecified ear: Secondary | ICD-10-CM | POA: Insufficient documentation

## 2009-02-24 LAB — CONVERTED CEMR LAB: Hgb A1c MFr Bld: 7.9 %

## 2009-03-02 ENCOUNTER — Telehealth (INDEPENDENT_AMBULATORY_CARE_PROVIDER_SITE_OTHER): Payer: Self-pay | Admitting: *Deleted

## 2009-03-02 ENCOUNTER — Ambulatory Visit: Payer: Self-pay | Admitting: Cardiology

## 2009-03-03 ENCOUNTER — Ambulatory Visit: Payer: Self-pay | Admitting: Internal Medicine

## 2009-03-03 LAB — CONVERTED CEMR LAB: Insulin/Carbohydrate Ratio: 1

## 2009-03-10 ENCOUNTER — Encounter (INDEPENDENT_AMBULATORY_CARE_PROVIDER_SITE_OTHER): Payer: Self-pay | Admitting: *Deleted

## 2009-03-13 ENCOUNTER — Telehealth (INDEPENDENT_AMBULATORY_CARE_PROVIDER_SITE_OTHER): Payer: Self-pay | Admitting: Internal Medicine

## 2009-03-15 ENCOUNTER — Ambulatory Visit (HOSPITAL_BASED_OUTPATIENT_CLINIC_OR_DEPARTMENT_OTHER): Admission: RE | Admit: 2009-03-15 | Discharge: 2009-03-15 | Payer: Self-pay | Admitting: Internal Medicine

## 2009-03-15 ENCOUNTER — Encounter (INDEPENDENT_AMBULATORY_CARE_PROVIDER_SITE_OTHER): Payer: Self-pay | Admitting: Internal Medicine

## 2009-03-18 ENCOUNTER — Ambulatory Visit: Payer: Self-pay | Admitting: Internal Medicine

## 2009-04-03 ENCOUNTER — Ambulatory Visit: Payer: Self-pay | Admitting: Internal Medicine

## 2009-04-15 DIAGNOSIS — G4733 Obstructive sleep apnea (adult) (pediatric): Secondary | ICD-10-CM

## 2009-04-15 HISTORY — DX: Obstructive sleep apnea (adult) (pediatric): G47.33

## 2009-04-18 ENCOUNTER — Telehealth (INDEPENDENT_AMBULATORY_CARE_PROVIDER_SITE_OTHER): Payer: Self-pay | Admitting: Internal Medicine

## 2009-04-25 ENCOUNTER — Telehealth (INDEPENDENT_AMBULATORY_CARE_PROVIDER_SITE_OTHER): Payer: Self-pay | Admitting: Internal Medicine

## 2009-05-03 ENCOUNTER — Encounter (INDEPENDENT_AMBULATORY_CARE_PROVIDER_SITE_OTHER): Payer: Self-pay | Admitting: Internal Medicine

## 2009-05-19 ENCOUNTER — Encounter (INDEPENDENT_AMBULATORY_CARE_PROVIDER_SITE_OTHER): Payer: Self-pay | Admitting: Internal Medicine

## 2009-05-19 ENCOUNTER — Ambulatory Visit: Payer: Self-pay | Admitting: Internal Medicine

## 2009-05-19 LAB — CONVERTED CEMR LAB: Hgb A1c MFr Bld: 7.3 %

## 2009-05-21 LAB — CONVERTED CEMR LAB
ALT: 72 units/L — ABNORMAL HIGH (ref 0–53)
Albumin: 4 g/dL (ref 3.5–5.2)
CO2: 27 meq/L (ref 19–32)
Calcium: 9 mg/dL (ref 8.4–10.5)
Chloride: 103 meq/L (ref 96–112)
Cholesterol: 88 mg/dL (ref 0–200)
Creatinine, Ser: 0.99 mg/dL (ref 0.40–1.50)
Microalb Creat Ratio: 5 mg/g (ref 0.0–30.0)
Potassium: 4 meq/L (ref 3.5–5.3)
Sodium: 140 meq/L (ref 135–145)
Total Protein: 6.4 g/dL (ref 6.0–8.3)

## 2009-05-22 ENCOUNTER — Telehealth (INDEPENDENT_AMBULATORY_CARE_PROVIDER_SITE_OTHER): Payer: Self-pay | Admitting: Internal Medicine

## 2009-05-23 ENCOUNTER — Encounter (INDEPENDENT_AMBULATORY_CARE_PROVIDER_SITE_OTHER): Payer: Self-pay | Admitting: Internal Medicine

## 2009-05-24 ENCOUNTER — Telehealth (INDEPENDENT_AMBULATORY_CARE_PROVIDER_SITE_OTHER): Payer: Self-pay | Admitting: Internal Medicine

## 2009-05-26 ENCOUNTER — Encounter (INDEPENDENT_AMBULATORY_CARE_PROVIDER_SITE_OTHER): Payer: Self-pay | Admitting: Internal Medicine

## 2009-06-02 ENCOUNTER — Ambulatory Visit (HOSPITAL_COMMUNITY): Admission: RE | Admit: 2009-06-02 | Discharge: 2009-06-02 | Payer: Self-pay | Admitting: Gastroenterology

## 2009-06-05 ENCOUNTER — Telehealth (INDEPENDENT_AMBULATORY_CARE_PROVIDER_SITE_OTHER): Payer: Self-pay | Admitting: Internal Medicine

## 2009-06-09 ENCOUNTER — Ambulatory Visit (HOSPITAL_COMMUNITY): Admission: RE | Admit: 2009-06-09 | Discharge: 2009-06-09 | Payer: Self-pay | Admitting: Internal Medicine

## 2009-06-09 ENCOUNTER — Ambulatory Visit: Payer: Self-pay | Admitting: Internal Medicine

## 2009-06-16 ENCOUNTER — Ambulatory Visit: Payer: Self-pay | Admitting: Family Medicine

## 2009-06-30 ENCOUNTER — Telehealth: Payer: Self-pay | Admitting: Internal Medicine

## 2009-07-25 ENCOUNTER — Ambulatory Visit: Payer: Self-pay | Admitting: Infectious Diseases

## 2009-07-25 LAB — CONVERTED CEMR LAB
Bilirubin Urine: NEGATIVE
Blood in Urine, dipstick: NEGATIVE
Urobilinogen, UA: 0.2

## 2009-07-31 ENCOUNTER — Telehealth: Payer: Self-pay | Admitting: Internal Medicine

## 2009-08-22 ENCOUNTER — Telehealth (INDEPENDENT_AMBULATORY_CARE_PROVIDER_SITE_OTHER): Payer: Self-pay | Admitting: Internal Medicine

## 2009-08-25 ENCOUNTER — Telehealth (INDEPENDENT_AMBULATORY_CARE_PROVIDER_SITE_OTHER): Payer: Self-pay | Admitting: Internal Medicine

## 2009-09-07 ENCOUNTER — Telehealth (INDEPENDENT_AMBULATORY_CARE_PROVIDER_SITE_OTHER): Payer: Self-pay | Admitting: Internal Medicine

## 2009-09-28 ENCOUNTER — Ambulatory Visit: Payer: Self-pay | Admitting: Internal Medicine

## 2009-09-28 LAB — CONVERTED CEMR LAB
ALT: 56 units/L — ABNORMAL HIGH (ref 0–53)
Albumin: 3.9 g/dL (ref 3.5–5.2)
Blood Glucose, Fingerstick: 276
CO2: 28 meq/L (ref 19–32)
Glucose, Bld: 253 mg/dL — ABNORMAL HIGH (ref 70–99)
Potassium: 4.1 meq/L (ref 3.5–5.3)
Sodium: 138 meq/L (ref 135–145)
Total Protein: 5.9 g/dL — ABNORMAL LOW (ref 6.0–8.3)

## 2009-09-29 ENCOUNTER — Telehealth (INDEPENDENT_AMBULATORY_CARE_PROVIDER_SITE_OTHER): Payer: Self-pay | Admitting: Internal Medicine

## 2009-10-11 ENCOUNTER — Telehealth (INDEPENDENT_AMBULATORY_CARE_PROVIDER_SITE_OTHER): Payer: Self-pay | Admitting: Internal Medicine

## 2009-10-23 ENCOUNTER — Ambulatory Visit: Payer: Self-pay | Admitting: Internal Medicine

## 2009-10-25 ENCOUNTER — Encounter (INDEPENDENT_AMBULATORY_CARE_PROVIDER_SITE_OTHER): Payer: Self-pay | Admitting: Internal Medicine

## 2009-10-30 ENCOUNTER — Telehealth (INDEPENDENT_AMBULATORY_CARE_PROVIDER_SITE_OTHER): Payer: Self-pay | Admitting: Internal Medicine

## 2009-11-01 ENCOUNTER — Encounter (INDEPENDENT_AMBULATORY_CARE_PROVIDER_SITE_OTHER): Payer: Self-pay | Admitting: Internal Medicine

## 2009-11-06 ENCOUNTER — Telehealth (INDEPENDENT_AMBULATORY_CARE_PROVIDER_SITE_OTHER): Payer: Self-pay | Admitting: Internal Medicine

## 2009-11-07 ENCOUNTER — Telehealth (INDEPENDENT_AMBULATORY_CARE_PROVIDER_SITE_OTHER): Payer: Self-pay | Admitting: Internal Medicine

## 2009-11-09 ENCOUNTER — Encounter (INDEPENDENT_AMBULATORY_CARE_PROVIDER_SITE_OTHER): Payer: Self-pay | Admitting: Internal Medicine

## 2009-11-16 ENCOUNTER — Encounter (INDEPENDENT_AMBULATORY_CARE_PROVIDER_SITE_OTHER): Payer: Self-pay | Admitting: Internal Medicine

## 2009-11-22 ENCOUNTER — Telehealth (INDEPENDENT_AMBULATORY_CARE_PROVIDER_SITE_OTHER): Payer: Self-pay | Admitting: Internal Medicine

## 2009-11-23 ENCOUNTER — Telehealth (INDEPENDENT_AMBULATORY_CARE_PROVIDER_SITE_OTHER): Payer: Self-pay | Admitting: Internal Medicine

## 2009-11-28 ENCOUNTER — Ambulatory Visit: Payer: Self-pay | Admitting: Internal Medicine

## 2009-12-11 ENCOUNTER — Encounter (INDEPENDENT_AMBULATORY_CARE_PROVIDER_SITE_OTHER): Payer: Self-pay | Admitting: Internal Medicine

## 2010-01-04 ENCOUNTER — Encounter (INDEPENDENT_AMBULATORY_CARE_PROVIDER_SITE_OTHER): Payer: Self-pay | Admitting: *Deleted

## 2010-01-04 ENCOUNTER — Encounter: Admission: RE | Admit: 2010-01-04 | Discharge: 2010-04-04 | Payer: Self-pay | Admitting: Internal Medicine

## 2010-01-05 ENCOUNTER — Ambulatory Visit: Payer: Self-pay | Admitting: Infectious Disease

## 2010-01-05 LAB — CONVERTED CEMR LAB
Blood Glucose, AC Bkfst: 189 mg/dL
Hgb A1c MFr Bld: 8.5 %

## 2010-01-08 LAB — CONVERTED CEMR LAB
ALT: 50 units/L (ref 0–53)
BUN: 16 mg/dL (ref 6–23)
Barbiturate Quant, Ur: NEGATIVE
CO2: 26 meq/L (ref 19–32)
Calcium: 9.5 mg/dL (ref 8.4–10.5)
Chloride: 104 meq/L (ref 96–112)
Cholesterol: 106 mg/dL (ref 0–200)
Creatinine, Ser: 0.9 mg/dL (ref 0.40–1.50)
Creatinine,U: 84.9 mg/dL
HCT: 43.2 % (ref 39.0–52.0)
HDL: 42 mg/dL (ref >39)
Hemoglobin: 14.6 g/dL (ref 13.0–17.0)
MCV: 85 fL (ref >=78.0)
Marijuana Metabolite: NEGATIVE
Methadone: NEGATIVE
Opiates: NEGATIVE
Phencyclidine (PCP): NEGATIVE
Platelets: 178 10*3/uL (ref 150–400)
Propoxyphene: NEGATIVE
Total Bilirubin: 0.6 mg/dL (ref 0.3–1.2)
Total CHOL/HDL Ratio: 2.5
Triglycerides: 70 mg/dL (ref <150)
VLDL: 14 mg/dL (ref 0–40)
WBC: 7.1 10*3/uL (ref 4.0–10.5)

## 2010-01-09 ENCOUNTER — Telehealth (INDEPENDENT_AMBULATORY_CARE_PROVIDER_SITE_OTHER): Payer: Self-pay | Admitting: Internal Medicine

## 2010-01-17 ENCOUNTER — Telehealth (INDEPENDENT_AMBULATORY_CARE_PROVIDER_SITE_OTHER): Payer: Self-pay | Admitting: Internal Medicine

## 2010-01-23 ENCOUNTER — Telehealth (INDEPENDENT_AMBULATORY_CARE_PROVIDER_SITE_OTHER): Payer: Self-pay | Admitting: Internal Medicine

## 2010-01-30 ENCOUNTER — Ambulatory Visit: Payer: Self-pay | Admitting: Internal Medicine

## 2010-01-31 ENCOUNTER — Telehealth (INDEPENDENT_AMBULATORY_CARE_PROVIDER_SITE_OTHER): Payer: Self-pay | Admitting: Internal Medicine

## 2010-02-12 ENCOUNTER — Telehealth (INDEPENDENT_AMBULATORY_CARE_PROVIDER_SITE_OTHER): Payer: Self-pay | Admitting: *Deleted

## 2010-02-20 ENCOUNTER — Telehealth: Payer: Self-pay | Admitting: *Deleted

## 2010-02-22 ENCOUNTER — Telehealth: Payer: Self-pay | Admitting: *Deleted

## 2010-04-06 ENCOUNTER — Telehealth: Payer: Self-pay | Admitting: Internal Medicine

## 2010-04-16 ENCOUNTER — Telehealth: Payer: Self-pay | Admitting: Internal Medicine

## 2010-05-02 ENCOUNTER — Telehealth: Payer: Self-pay | Admitting: Internal Medicine

## 2010-05-07 IMAGING — CR DG SHOULDER 2+V*R*
3 series · 3 of 3 positions shown · non-contrast
Comparison: None

CLINICAL DATA: Acute pain in right shoulder after falling.

RIGHT SHOULDER - 2+ VIEW

[w shoulder ap internal righ]
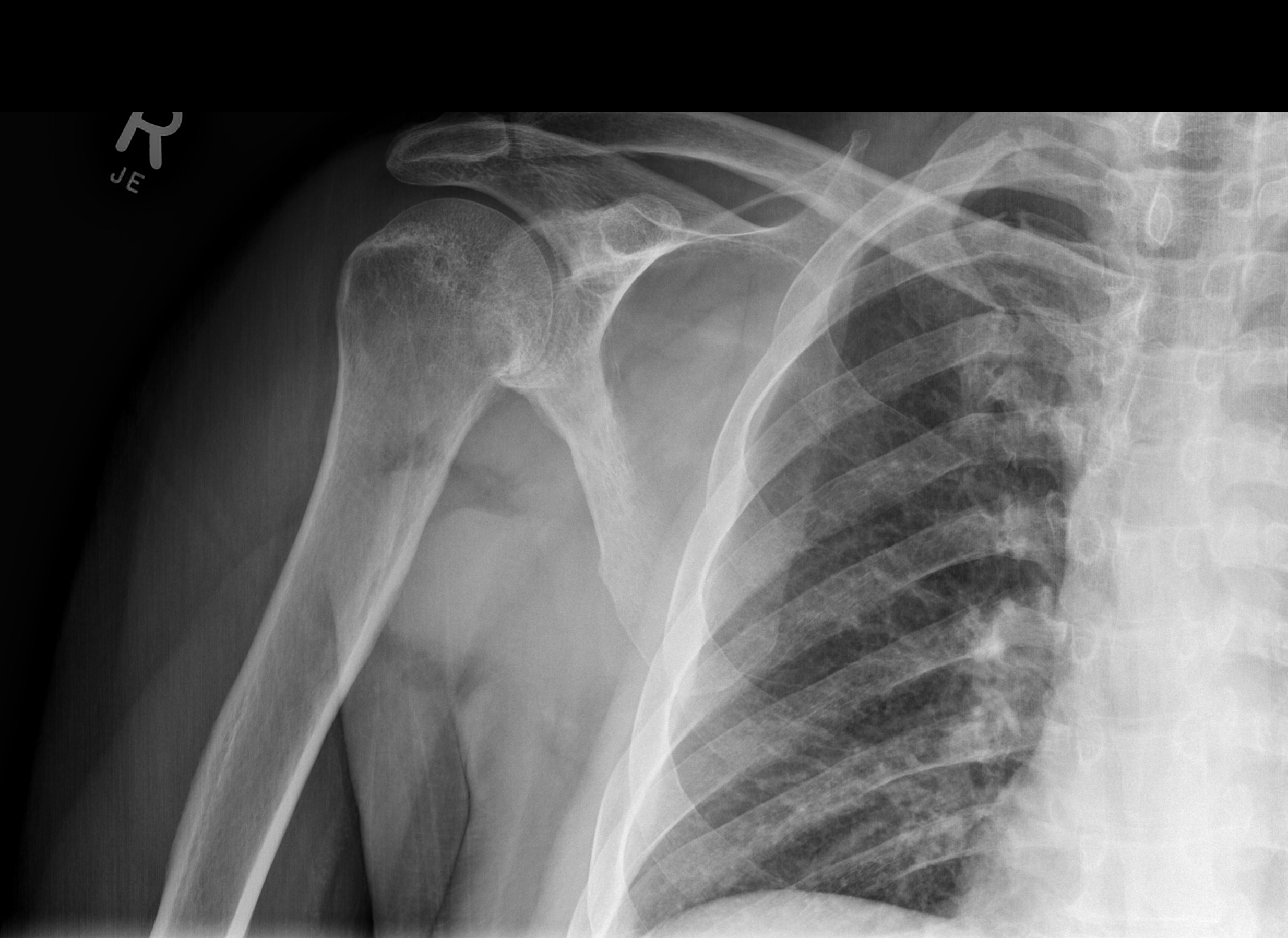

[w shoulder ap external righ]
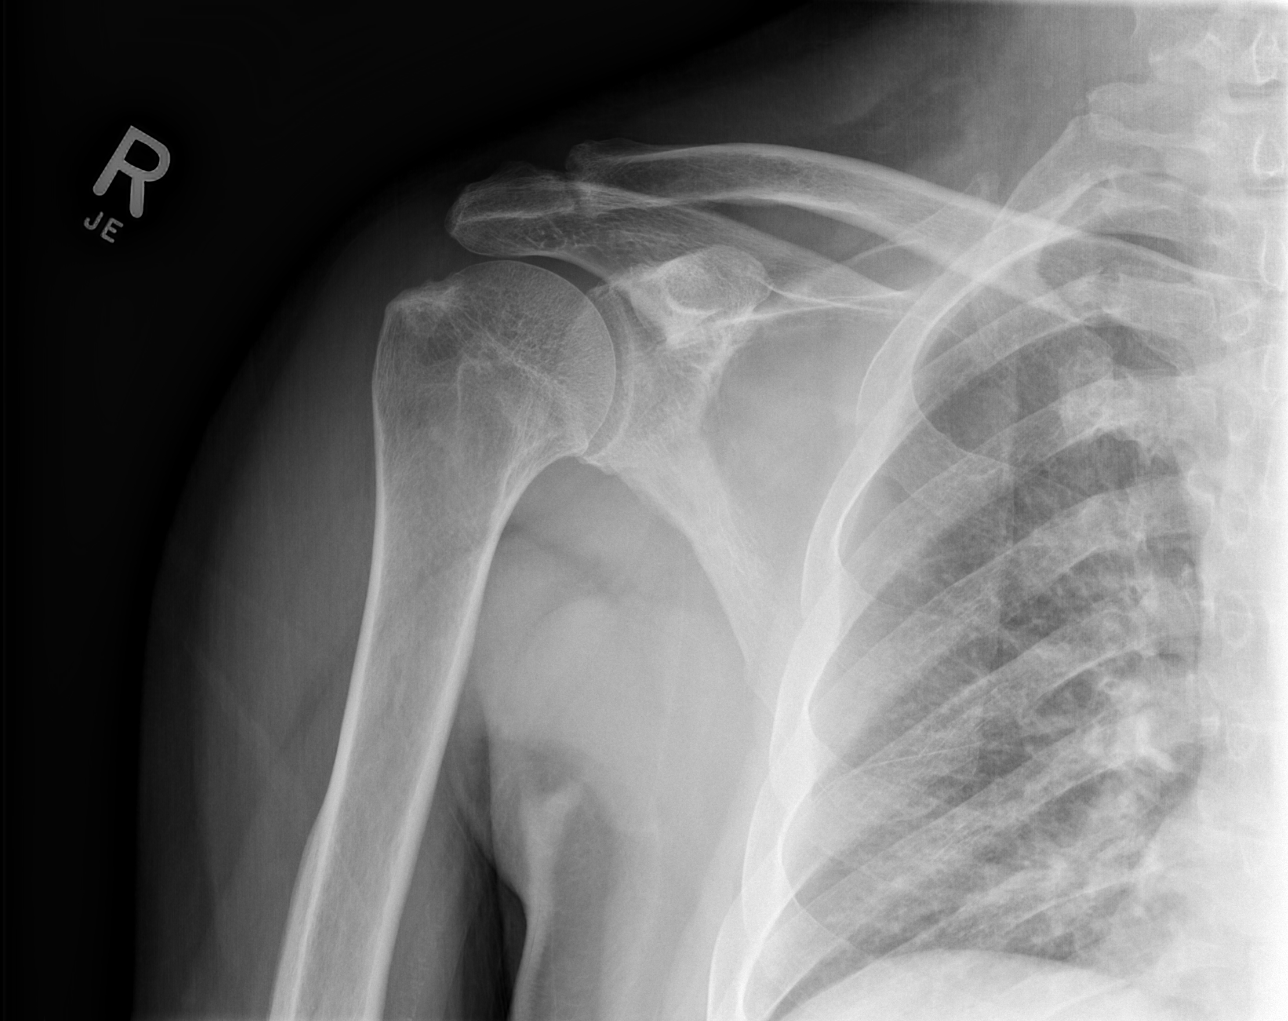

[x shoulder axillary right]
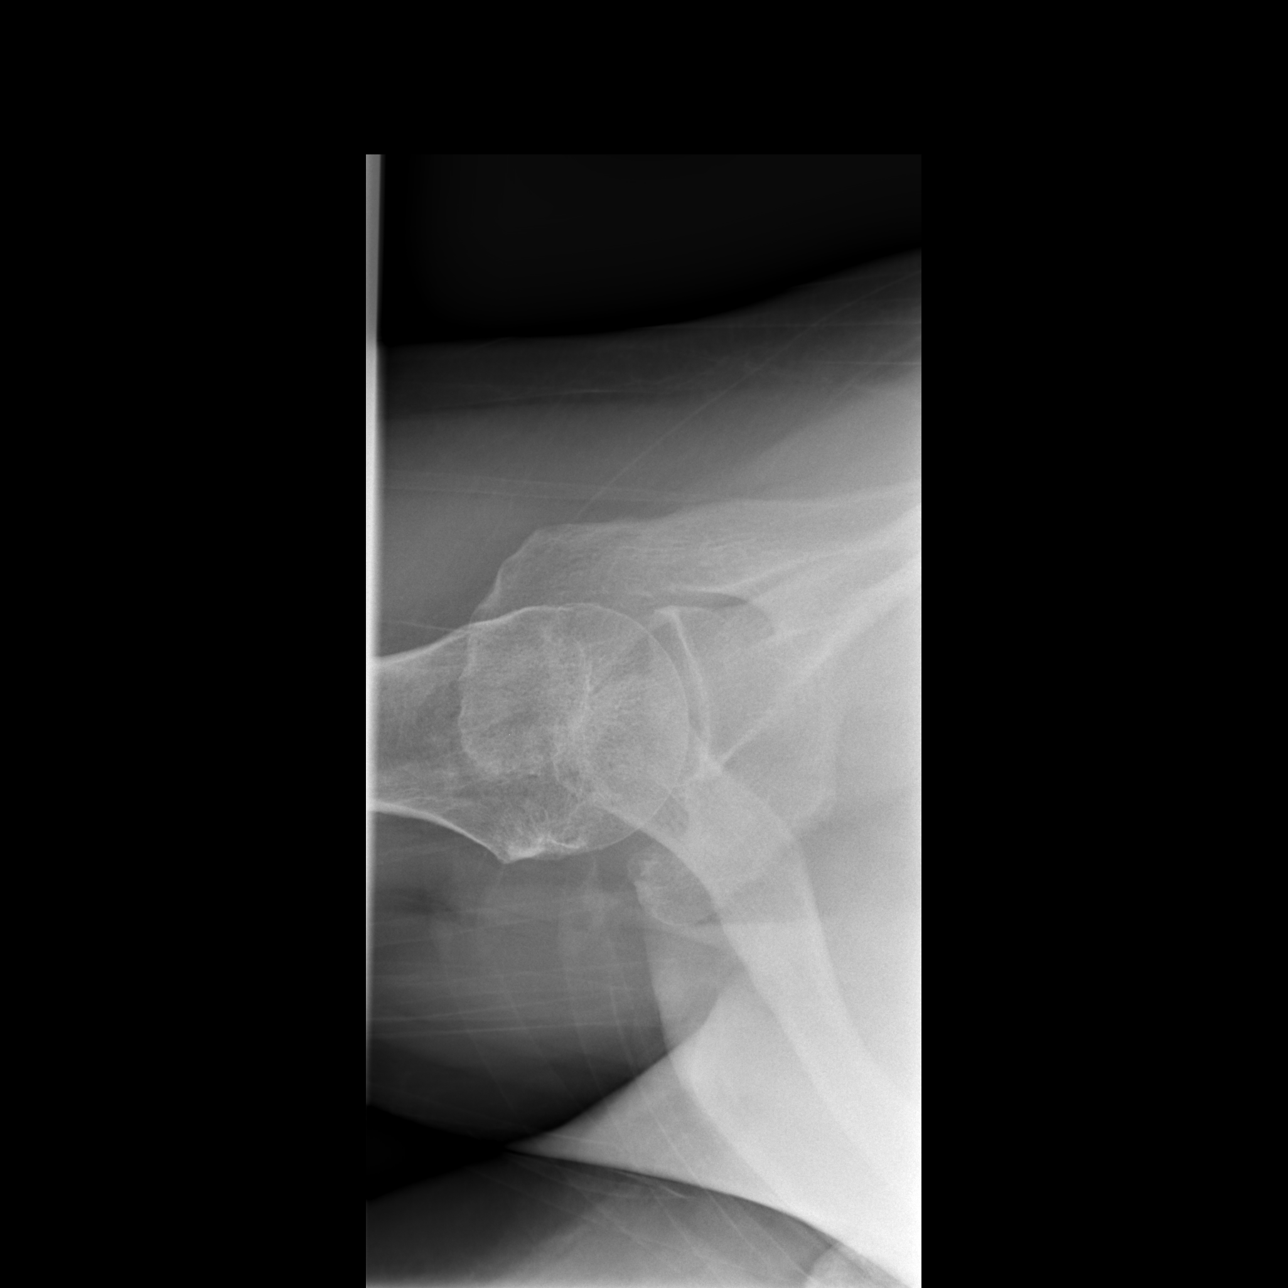

[3 of 3 positions shown; findings below may reference images not displayed]

FINDINGS: There are degenerative changes in the shoulder.  No
evidence for acute fracture or dislocation however.  There are
bronchitic changes in the lungs.
IMPRESSION: Degenerative changes in the shoulder. No evidence for acute
abnormality.

## 2010-05-16 ENCOUNTER — Telehealth: Payer: Self-pay | Admitting: Internal Medicine

## 2010-05-28 ENCOUNTER — Telehealth: Payer: Self-pay | Admitting: Internal Medicine

## 2010-06-01 ENCOUNTER — Telehealth: Payer: Self-pay | Admitting: Internal Medicine

## 2010-06-04 ENCOUNTER — Telehealth: Payer: Self-pay | Admitting: Internal Medicine

## 2010-06-08 ENCOUNTER — Ambulatory Visit: Payer: Self-pay | Admitting: Internal Medicine

## 2010-06-08 LAB — CONVERTED CEMR LAB

## 2010-06-09 DIAGNOSIS — G5603 Carpal tunnel syndrome, bilateral upper limbs: Secondary | ICD-10-CM | POA: Insufficient documentation

## 2010-06-09 DIAGNOSIS — G5620 Lesion of ulnar nerve, unspecified upper limb: Secondary | ICD-10-CM | POA: Insufficient documentation

## 2010-07-05 ENCOUNTER — Ambulatory Visit: Payer: Self-pay | Admitting: Cardiology

## 2010-07-11 ENCOUNTER — Telehealth (INDEPENDENT_AMBULATORY_CARE_PROVIDER_SITE_OTHER): Payer: Self-pay | Admitting: *Deleted

## 2010-07-25 ENCOUNTER — Telehealth: Payer: Self-pay | Admitting: Internal Medicine

## 2010-08-08 ENCOUNTER — Emergency Department (HOSPITAL_COMMUNITY)
Admission: EM | Admit: 2010-08-08 | Discharge: 2010-08-08 | Payer: Self-pay | Source: Home / Self Care | Admitting: Emergency Medicine

## 2010-08-24 ENCOUNTER — Encounter: Payer: Self-pay | Admitting: Internal Medicine

## 2010-09-10 ENCOUNTER — Emergency Department (HOSPITAL_COMMUNITY)
Admission: EM | Admit: 2010-09-10 | Discharge: 2010-09-10 | Payer: Self-pay | Source: Home / Self Care | Admitting: Emergency Medicine

## 2010-09-10 ENCOUNTER — Telehealth: Payer: Self-pay | Admitting: Internal Medicine

## 2010-09-18 NOTE — Assessment & Plan Note (Signed)
Summary: 2WK F/U/EST/VS   Vital Signs:  Patient profile:   55 year old male Height:      72 inches (182.88 cm) Weight:      225.4 pounds (102.45 kg) BMI:     30.68 Temp:     96.6 degrees F (35.89 degrees C) oral Pulse rate:   80 / minute BP sitting:   108 / 66  (right arm)  Vitals Entered By: Stanton Kidney Ditzler RN (October 23, 2009 4:28 PM) Is Patient Diabetic? Yes Did you bring your meter with you today? No Pain Assessment Patient in pain? yes     Location: right rib cage Intensity: 5-7 Type: aching Onset of pain  past 3 weeks Nutritional Status BMI of > 30 = obese Nutritional Status Detail appetite down  Have you ever been in a relationship where you felt threatened, hurt or afraid?denies   Does patient need assistance? Functional Status Self care Ambulation Normal Comments CBG done in AM at home 124. FU - feeling sl better. Ck ears and needs eye referral.   Primary Care Provider:  Jason Coop MD   History of Present Illness: [name] is a 55 yo man with PMH as outlined  in the EMR coems today for a f/u visit.   1. OSA: He started to use his CPAP, he has some irritation in his nose and he feels that his breathing is getting smooth.   2. DM: Since last visit, he had 1 episode of low blood sugar in 65 and he had no symptoms with that. He is taking the same medicine as listed in the EMR.   3. Rotator CUff: It is better and so he did not go to sports medicine doctor.   Depression History:      The patient denies a depressed mood most of the day and a diminished interest in his usual daily activities.         Preventive Screening-Counseling & Management  Alcohol-Tobacco     Smoking Status: quit     Year Quit: only at age 31     Passive Smoke Exposure: no  Caffeine-Diet-Exercise     Does Patient Exercise: yes     Type of exercise: walking     Times/week: 2  Current Medications (verified): 1)  Plavix 75 Mg Tabs (Clopidogrel Bisulfate) .... Take 1 Tablet By  Mouth Once A Day 2)  Niaspan 1000 Mg Cr-Tabs (Niacin (Antihyperlipidemic)) .... Take 2 Tablet By Mouth Once A Day 3)  Accupril 40 Mg Tabs (Quinapril Hcl) .... Take 1 Tablet By Mouth Once A Day For Blood Pressure 4)  Flax Seed Oil 1000 Mg Caps (Flaxseed (Linseed)) .... Take 1 Capsule By Mouth Two Times A Day 5)  Aspir-Low 81 Mg Tbec (Aspirin) .... Take 1 Tablet By Mouth Two Times A Day 6)  Coreg 6.25 Mg Tabs (Carvedilol) .... Take 1 Tablet By Mouth Two Times A Day 7)  Albuterol 90 Mcg/act  Aers (Albuterol) .... Take 2 Puffs As Needed For Respiratory Difficulty Up To Every 3 Hourly. 8)  Hydrochlorothiazide 25 Mg Tabs (Hydrochlorothiazide) .... Take 1 Tablet By Mouth Once A Day 9)  Sudafed Pe Sinus/allergy 4-10 Mg  Tabs (Chlorpheniramine-Phenylephrine) .... Take 1 Tablet By Mouth Two Times A Day 10)  Nasonex 50 Mcg/act  Susp (Mometasone Furoate) .... Apply On Spray On Each Nostril Daily. 11)  Novolin R 100 Unit/ml Soln (Insulin Regular Human) .... Inject 30 Units Subcutaneously With Each Meal 12)  Insulin Syringe 30g X 5/16"  1 Ml Misc (Insulin Syringe-Needle U-100) .... Use To Inject Insulin 13)  Truetrack Test  Strp (Glucose Blood) .... Use To Test Blood Glucsoe 3x Daily 14)  Lancets Thin  Misc (Lancets) .... Use To Test Blood Glucose 3x Daily 15)  Singulair 10 Mg Tabs (Montelukast Sodium) .... Take 1 Tablet By Mouth Once A Day 16)  Lantus 100 Unit/ml Soln (Insulin Glargine) .... Take 50 Units Daily. 17)  Allegra 180 Mg Tabs (Fexofenadine Hcl) .... Take 1 Pill By Mouth Daily As Needed For Allergy 18)  Crestor 5 Mg Tabs (Rosuvastatin Calcium) .... Take 1 Pill By Mouth Daily. 19)  Prandin 0.5 Mg Tabs (Repaglinide) .... Take One Tablet 15 Minutes Before Meal If You Can Not Take Insulin. 20)  Vicodin 5-500 Mg Tabs (Hydrocodone-Acetaminophen) .... Take 1 Pill By Mouth Three Times A Day As Needed For Pain. 21)  Cialis 20 Mg Tabs (Tadalafil) .... Take One Pill Daily As Needed 30 Minutes Before Sexual  Activity. 22)  Neurontin 300 Mg Caps (Gabapentin) .... Take 1 Capsule At Bedtime Daily.  Allergies: No Known Drug Allergies  Review of Systems      See HPI  Physical Exam  Ears:  Cerumen impacted on right side.  Mouth:  pharynx pink and moist.   Lungs:  normal breath sounds, no crackles, and no wheezes.   Heart:  normal rate, regular rhythm, no murmur, and no gallop.   Msk:  Pt is able to abduct his right arm more than before with much less pain.  Neurologic:  alert & oriented X3.     Impression & Recommendations:  Problem # 1:  CERUMEN IMPACTION (ICD-380.4) Encouraged to use debrox ear drop.   Problem # 2:  ROTATOR CUFF SYNDROME, RIGHT (ICD-726.10) Pt has increased range of abduction compared with last visit. Since the pain started to improve he did not go to sports medicine. I counselled him about frozen shoulder and the need to constantly move his arm and the risk of frozen shoulder if he is not moving his arm. Options is to go to sports medicine for a steroid injection vs continue current treatment, especially he is able to move his shoulder more now with less pain.   Problem # 3:  SLEEP APNEA (ICD-780.57) Pt  started to wear CPAP for 3 days and he already started feeling a lot better. He had some irritaion with his nose and [facility] will call [facility] to see if they can make some adjustments.   Problem # 4:  DIABETES MELLITUS, TYPE II (ICD-250.00) Pt didnot bring his meter today, but states his CBG is better and doesnot have any symptomatic hypoglycemia. Plan is to check his meter and make adjustments to his medicine.  His updated medication list for this problem includes:    Accupril 40 Mg Tabs (Quinapril hcl) .Marland Kitchen... Take 1 tablet by mouth once a day for blood pressure    Aspir-low 81 Mg Tbec (Aspirin) .Marland Kitchen... Take 1 tablet by mouth two times a day    Novolin R 100 Unit/ml Soln (Insulin regular human) ..... Inject 30 units subcutaneously with each meal    Lantus 100 Unit/ml  Soln (Insulin glargine) .Marland Kitchen... Take 50 units daily.    Prandin 0.5 Mg Tabs (Repaglinide) .Marland Kitchen... Take one tablet 15 minutes before meal if you can not take insulin.  Labs Reviewed: Creat: 1.01 (09/28/2009)     Last Eye Exam: No diabetic retinopathy.   Visual acuity OD (best corrected):     20/25 Visual  acuity OS (best corrected):     20/30 Intraocular pressure OD:     22 Intraocular pressure OS:     22  (11/02/2008) Reviewed HgBA1c results: 8.2 (09/28/2009)  7.3 (05/19/2009)  Complete Medication List: 1)  Plavix 75 Mg Tabs (Clopidogrel bisulfate) .... Take 1 tablet by mouth once a day 2)  Niaspan 1000 Mg Cr-tabs (Niacin (antihyperlipidemic)) .... Take 2 tablet by mouth once a day 3)  Accupril 40 Mg Tabs (Quinapril hcl) .... Take 1 tablet by mouth once a day for blood pressure 4)  Flax Seed Oil 1000 Mg Caps (Flaxseed (linseed)) .... Take 1 capsule by mouth two times a day 5)  Aspir-low 81 Mg Tbec (Aspirin) .... Take 1 tablet by mouth two times a day 6)  Coreg 6.25 Mg Tabs (Carvedilol) .... Take 1 tablet by mouth two times a day 7)  Albuterol 90 Mcg/act Aers (Albuterol) .... Take 2 puffs as needed for respiratory difficulty up to every 3 hourly. 8)  Hydrochlorothiazide 25 Mg Tabs (Hydrochlorothiazide) .... Take 1 tablet by mouth once a day 9)  Sudafed Pe Sinus/allergy 4-10 Mg Tabs (Chlorpheniramine-phenylephrine) .... Take 1 tablet by mouth two times a day 10)  Nasonex 50 Mcg/act Susp (Mometasone furoate) .... Apply on spray on each nostril daily. 11)  Novolin R 100 Unit/ml Soln (Insulin regular human) .... Inject 30 units subcutaneously with each meal 12)  Insulin Syringe 30g X 5/16" 1 Ml Misc (Insulin syringe-needle u-100) .... Use to inject insulin 13)  Truetrack Test Strp (Glucose blood) .... Use to test blood glucsoe 3x daily 14)  Lancets Thin Misc (Lancets) .... Use to test blood glucose 3x daily 15)  Singulair 10 Mg Tabs (Montelukast sodium) .... Take 1 tablet by mouth once a day 16)   Lantus 100 Unit/ml Soln (Insulin glargine) .... Take 50 units daily. 17)  Allegra 180 Mg Tabs (Fexofenadine hcl) .... Take 1 pill by mouth daily as needed for allergy 18)  Crestor 5 Mg Tabs (Rosuvastatin calcium) .... Take 1 pill by mouth daily. 19)  Prandin 0.5 Mg Tabs (Repaglinide) .... Take one tablet 15 minutes before meal if you can not take insulin. 20)  Vicodin 5-500 Mg Tabs (Hydrocodone-acetaminophen) .... Take 1 pill by mouth three times a day as needed for pain. 21)  Cialis 20 Mg Tabs (Tadalafil) .... Take one pill daily as needed 30 minutes before sexual activity. 22)  Neurontin 300 Mg Caps (Gabapentin) .... Take 1 capsule at bedtime daily.  Patient Instructions: 1)  Please schedule a follow-up appointment in 1 month. 2)  Limit your Sodium (Salt) to less than 2 grams a day(slightly less than 1/2 a teaspoon) to prevent fluid retention, swelling, or worsening of symptoms. 3)  It is important that you exercise regularly at least 20 minutes 5 times a week. If you develop chest pain, have severe difficulty breathing, or feel very tired , stop exercising immediately and seek medical attention. 4)  You need to lose weight. Consider a lower calorie diet and regular exercise.  5)  Check your blood sugars regularly. If your readings are usually above : or below 70 you should contact our office. 6)  It is important that your Diabetic A1c level is checked every 3 months. 7)  See your eye doctor yearly to check for diabetic eye damage. 8)  Check your feet each night for sore areas, calluses or signs of infection. 9)  Check your Blood Pressure regularly. If it is above: you should make an appointment.  Prevention & Chronic Care Immunizations   Influenza vaccine: Fluvax 3+  (05/19/2009)   Influenza vaccine deferral: Deferred  (04/03/2009)    Tetanus booster: Not documented   Td booster deferral: Deferred  (04/03/2009)    Pneumococcal vaccine: Not documented   Pneumococcal vaccine  deferral: Deferred  (04/03/2009)  Colorectal Screening   Hemoccult: Not documented   Hemoccult action/deferral: Deferred  (04/03/2009)    Colonoscopy: Not documented   Colonoscopy action/deferral: Deferred  (04/03/2009)  Other Screening   PSA: Not documented   PSA action/deferral: Discussion deferred  (04/03/2009)   Smoking status: quit  (10/23/2009)  Diabetes Mellitus   HgbA1C: 8.2  (09/28/2009)   HgbA1C action/deferral: Deferred  (04/03/2009)   Hemoglobin A1C due: 09/16/2007    Eye exam: No diabetic retinopathy.   Visual acuity OD (best corrected):     20/25 Visual acuity OS (best corrected):     20/30 Intraocular pressure OD:     22 Intraocular pressure OS:     22   (11/02/2008)   Eye exam due: 11/2009    Foot exam: yes  (09/28/2008)   High risk foot: No  (09/28/2008)   Foot care education: Done  (09/28/2008)   Foot exam due: 12/18/2007    Urine microalbumin/creatinine ratio: 5.0  (05/19/2009)   Urine microalbumin/cr due: 12/21/2007  Lipids   Total Cholesterol: 88  (05/19/2009)   Lipid panel action/deferral: Deferred   LDL: 43  (05/19/2009)   LDL Direct: Not documented   HDL: 32  (05/19/2009)   Triglycerides: 66  (05/19/2009)   Lipid panel due: 12/19/2007    SGOT (AST): 35  (09/28/2009)   SGPT (ALT): 56  (09/28/2009)   Alkaline phosphatase: 101  (09/28/2009)   Total bilirubin: 0.5  (09/28/2009)  Hypertension   Last Blood Pressure: 108 / 66  (10/23/2009)   Serum creatinine: 1.01  (09/28/2009)   BMP action: Deferred   Serum potassium 4.1  (09/28/2009)  Self-Management Support :   Personal Goals (by the next clinic visit) :     Personal A1C goal: 7  (05/19/2009)     Personal blood pressure goal: 140/90  (05/19/2009)     Personal LDL goal: 70  (05/19/2009)    Patient will work on the following items until the next clinic visit to reach self-care goals:     Medications and monitoring: take my medicines every day, check my blood sugar, bring all of my  medications to every visit, examine my feet every day  (10/23/2009)     Eating: eat more vegetables, use fresh or frozen vegetables, eat foods that are low in salt, eat fruit for snacks and desserts  (10/23/2009)     Activity: take a 30 minute walk every day, take the stairs instead of the elevator, park at the far end of the parking lot  (10/23/2009)    Diabetes self-management support: Written self-care plan, Education handout, Resources for patients handout  (10/23/2009)   Diabetes care plan printed   Diabetes education handout printed   Last diabetes self-management training by diabetes educator: 03/03/2009    Hypertension self-management support: Education handout, Written self-care plan, Resources for patients handout  (10/23/2009)   Hypertension self-care plan printed.   Hypertension education handout printed    Lipid self-management support: Written self-care plan, Education handout, Resources for patients handout  (10/23/2009)   Lipid self-care plan printed.   Lipid education handout printed      Resource handout printed.

## 2010-09-18 NOTE — Progress Notes (Signed)
Summary: med refill/gp  Phone Note Refill Request Message from:  Fax from Pharmacy on September 29, 2009 12:37 PM  Refills Requested: Medication #1:  NIASPAN 1000 MG CR-TABS Take 2 tablet by mouth once a day   Last Refilled: 04/05/2009  Method Requested: Telephone to Pharmacy Initial call taken by: Chinita Pester RN,  September 29, 2009 12:37 PM  Follow-up for Phone Call       Follow-up by: Jason Coop MD,  September 29, 2009 12:44 PM    Prescriptions: NIASPAN 1000 MG CR-TABS (NIACIN (ANTIHYPERLIPIDEMIC)) Take 2 tablet by mouth once a day  #60 x 4   Entered and Authorized by:   Jason Coop MD   Signed by:   Jason Coop MD on 09/29/2009   Method used:   Faxed to ...       [facility] Department (retail)       [address] [place], [place]  [address]       Ph: [phone]       Fax: [phone]   RxID:   [id number]

## 2010-09-18 NOTE — Progress Notes (Signed)
Summary: med refill/gp  Phone Note Refill Request Message from:  Fax from Pharmacy on November 06, 2009 2:12 PM  Refills Requested: Medication #1:  PRANDIN 0.5 MG TABS Take one tablet 15 minutes before meal if you can not take insulin.   Last Refilled: 09/06/2009  Method Requested: Fax to Local Pharmacy Initial call taken by: Chinita Pester RN,  November 06, 2009 2:12 PM  Follow-up for Phone Call       Follow-up by: Jason Coop MD,  November 07, 2009 8:36 AM    Prescriptions: PRANDIN 0.5 MG TABS (REPAGLINIDE) Take one tablet 15 minutes before meal if you can not take insulin.  #32 x 3   Entered and Authorized by:   Jason Coop MD   Signed by:   Jason Coop MD on 11/07/2009   Method used:   Telephoned to ...       [facility] Department (retail)       [address] [place], [place]  [address]       Ph: [phone]       Fax: [phone]   RxID:   [id number]   Appended Document: med refill/gp Above Rx refill request faxed to [facility] pharmacy.

## 2010-09-18 NOTE — Assessment & Plan Note (Signed)
Summary: ROUTINE CK/VS   Vital Signs:  Patient Profile:   55 Years Old Male Height:     73 inches (185.42 cm) Weight:      218.7 pounds (99.41 kg) BMI:     28.96 Temp:     97.5 degrees F (36.39 degrees C) oral Pulse rate:   81 / minute BP sitting:   156 / 89  (right arm)  Pt. in pain?   yes    Location:   stomach    Intensity:   3    Type:       dull  Vitals Entered By: Filomena Jungling (Dec 19, 2006 3:01 PM)              Is Patient Diabetic? Yes  Nutritional Status Normal CBG Result 45  Have you ever been in a relationship where you felt threatened, hurt or afraid?No   Does patient need assistance? Functional Status Self care Ambulation Normal   PCP:  Tyler Carpen MD  Chief Complaint:  Diabetes Management.  History of Present Illness: Tyler Wilkinson is a 55 yr WM who presents for DM f/u. He appears to be using Lantus 45 U qhs and a Novolog scale set at about 10U for sround a 250 glucose level.   He on average checks 3-4 times a day.  He reports weight gain, but denies polyuria, polydipsia, and blurred vision.  Unfortunately this is another visit where he presents with a CBG around 45. As he works the 3rd shift, his sleep wake cycle is a bit unusual and on average he may eat 2 times a day. Apparently he denies any hypoglycemia episodes at home. He admits today to not eating very well before this appt, but yet taking a high dose of Novolog for a mildly elevated CBG around 230. He does admit that his CBGs have been all over the place, but apparently he has not increased his Lantus dose. It is apparent that he does not have a general understanding of the role of Lantus.    He also today is complaining of significant loose stools with some dyspesia. Apparently he is using lots of Sweet-n-low in his pitcher of tea and most other beverages. He denies any fever, chills as well as nausea. He feels like he has gained some weight since our last visit. His appetite remains good but not  entirely different.   Current Allergies (reviewed today): No known allergies   Past Medical History:    Diabetes mellitus, type II    Hypertension    Allergic rhinitis    ED    Bronchitis    Hyperlipidemia    hx/o MI, inferior wall    Cerumen impaction  Past Surgical History:    PTCA/stent to RCA   Social History:    Single   Risk Factors: Tobacco use:  quit    Year quit:  33 yrs ago   Review of Systems  The patient denies anorexia, fever, vision loss, chest pain, syncope, dyspnea on exhertion, peripheral edema, and hematuria.     Physical Exam  General:     alert, well-developed, and well-nourished.   Eyes:     vision grossly intact.   Neck:     supple, full ROM, and no masses.   Lungs:     normal respiratory effort, no accessory muscle use, normal breath sounds, no crackles, and no wheezes.   Heart:     normal rate, regular rhythm, no murmur, no gallop, no rub,  and physiological split S2.   Abdomen:     soft, no masses, no rigidity, no rebound tenderness, no hepatomegaly, and no splenomegaly.   Extremities:     No clubbing, cyanosis, edema, or deformity noted with normal full range of motion of all joints.   Neurologic:     alert & oriented X3.   Skin:     no rashes.   Cervical Nodes:     no anterior cervical adenopathy and no posterior cervical adenopathy.   Psych:     Oriented X3, memory intact for recent and remote, normally interactive, and slightly anxious.      Impression & Recommendations:  Problem # 1:  DIABETES MELLITUS, TYPE II (ICD-250.00) In light of his repeated episodes of hypoglycemia in our office coupled with his infrequent meal consumption make our use of Novolog sliding scale very risky. Although I would like a better way to manage his diabetes, having already suffered the same problems with 70/30, I feel we need to gradually increase his Lantus therapy. We should continue his metformin for as long as we monitor his renal function.  I educated [name] on gradually every night increasing his nighttime dose by 1 unit if the majority of his daytime glucose levels are elevated about 200 and especially if his fasting glucose is >130.  We are lucky we haven't experienced and organ dysfunction seconadry to his diabetes of significance, except his ED. We will start to increase his Lanuts to 50 units for now and as above increase.   The following medications were removed from the medication list:    Novolog Flexpen 100 Unit/ml Soln (Insulin aspart) .Marland KitchenMarland KitchenMarland KitchenMarland Kitchen 15units with meals two times a day  His updated medication list for this problem includes:    Lantus 100 Unit/ml Soln (Insulin glargine) .Marland Kitchen... Take 50 units at bedtime for diabetes.    Accupril 20 Mg Tabs (Quinapril hcl) .Marland Kitchen... Take 1 tablet by mouth once a day for blood pressure    Glucophage 1000 Mg Tabs (Metformin hcl) .Marland Kitchen... Take 1 tablet by mouth two times a day  Orders: T- Capillary Blood Glucose (40981) T-Hgb A1C (in-house) (19147WG) T-Comprehensive Metabolic Panel [id number]) T-Lipid Profile ([id number]) T-Urine Microalbumin w/creat. ratio [phone] / [id number]) T-Urinalysis (36644-03474) BD Glucose tabs (EMRORAL) BD Glucose tabs (EMRORAL)  Labs Reviewed: HgBA1c: 8.3 (12/19/2006)   Creat: 0.95 (09/09/2006)      Problem # 2:  HYPERTENSION (ICD-401.9) I am confused as to why we didn't restart his beta blocker therapy from last year. I believe we stopped this temporarily when he kept having his hypoglycemia episodes on 70/30 without much warning. Especially with his history of an MI, perhaps we can start a low dose daily option such as Bisioprolol 5mg  vs carvedilol. I will contact him and discuss which will be the best option. He certainly will be maximized on his ACE.   His updated medication list for this problem includes:    Accupril 20 Mg Tabs (Quinapril hcl) .Marland Kitchen... Take 1 tablet by mouth once a day for blood pressure  Orders: T-Comprehensive Metabolic Panel  ([id number]) T-Urine Microalbumin w/creat. ratio (43329 / [id number])  BP today: 156/89 Prior BP: 153/83 (10/02/2006)  Labs Reviewed: Creat: 0.95 (09/09/2006) Chol: 135 (09/09/2006)   HDL: 43 (09/09/2006)   LDL: 68 (09/09/2006)   TG: 119 (09/09/2006)   Problem # 3:  HYPERLIPIDEMIA, MIXED (ICD-272.2) Lipid panel in Jan was excellent. He is fasting today to see if this si still the case. His LDL is at goal. m  he continues to take Fish oil.   His updated medication list for this problem includes:    Niaspan 500 Mg Tbcr (Niacin (antihyperlipidemic)) .Marland Kitchen... Take 2 tablets by mouth once a day  Orders: T-Lipid Profile [phone])  Labs Reviewed: Chol: 135 (09/09/2006)   HDL: 43 (09/09/2006)   LDL: 68 (09/09/2006)   TG: 119 (09/09/2006) SGOT: 20 (09/09/2006)   SGPT: 35 (09/09/2006)   Problem # 4:  ALLERGIC RHINITIS (ICD-477.9) He doesn't feel that Zyrtec is working well anymore. He failed Claritin in the past. He thinks he would like to give Allegra another try with his Flonase nasal spray. He was educated on monitoring for any significant epistaxis with over use of his Flonase and to hold this therapy for at least a week or until this adverse reaction has stopped.  The following medications were removed from the medication list:    Zyrtec 10 Mg Tabs (Cetirizine hcl) .Marland Kitchen... Take 1 tablet by mouth once a day  His updated medication list for this problem includes:    Flonase 50 Mcg/act Susp (Fluticasone propionate) .Marland Kitchen... Give one puff to each nostril every day    Allegra 180 Mg Tabs (Fexofenadine hcl) .Marland Kitchen... Take 1 tablet by mouth once a day for allergies   Problem # 5:  DYSPEPSIA (ICD-536.8) His symptoms and nonfocal exam are suggestive of mild dyspepsia. Although he denies any reflux symptoms or supine problems, I suggested a week trial of two times a day Famotidine. If his symptoms improve we can certainly consider long term therapy. If not, perhaps we should consider eval for  gastroparesis.   Medications Added to Medication List This Visit: 1)  Lantus 100 Unit/ml Soln (Insulin glargine) .... Take 50 units at bedtime for diabetes. 2)  Accupril 20 Mg Tabs (Quinapril hcl) .... Take 1 tablet by mouth once a day for blood pressure 3)  Flonase 50 Mcg/act Susp (Fluticasone propionate) .... Give one puff to each nostril every day 4)  Flax Seed Oil 1000 Mg Caps (Flaxseed (linseed)) .... Take 1 capsule by mouth two times a day 5)  Allegra 180 Mg Tabs (Fexofenadine hcl) .... Take 1 tablet by mouth once a day for allergies 6)  Famotidine 40 Mg Tabs (Famotidine) .... Take 1 tablet by mouth once a day for upset stomach   Patient Instructions: 1)  Please do not take Novolog insulin anymore.  2)  Check you sugars 4 times a day, if your fasting sugar, that is the sugar after you wake up is over 130, increase your Lantus by 1 unit. We will reassess your Lantus at your next appt. 3)  Check your blood sugars regularly. If your readings are usually above : or below 70 you should contact our office. 4)  Please schedule a follow-up appointment in 2 weeks.     Last LDL:                                                 68 (09/09/2006 9:05:00 PM)        Diabetic Foot Exam Last Podiatry Exam Date: 12/19/2006  Foot Inspection Is there a history of a foot ulcer?              No Can the patient see the bottom of their feet?          Yes Are the shoes appropriate in  style and fit?          Yes Is there swelling or an abnormal foot shape?          No Are the toenails long?                Yes Are the toenails thick?                No Is there heavy callous build-up?              No Is there a claw toe deformity?                          No Is there elevated skin temperature?            No Do you have pain in calf while walking?           No      Diabetic Foot Care Education :Patient educated on appropriate care of diabetic feet.  Pulse Check          Right Foot          Left  Foot Posterior Tibial:        3+            3+ Dorsalis Pedis:        3+            3+ Comments: no ulcers or problem with skin integrity High Risk Feet? No Set Next Diabetic Foot Exam here: 12/18/2007   10-g (5.07) Semmes-Weinstein Monofilament Test Performed by: Filomena Jungling          Right Foot          Left Foot Site 1         normal         normal Site 2         normal         normal Site 3         normal         normal Site 4         normal         normal Site 5         normal         normal Site 6         normal         normal Site 7         normal         normal Site 8         normal         normal Site 9         normal         normal    Vital Signs:  Patient Profile:   55 Years Old Male Height:     73 inches (185.42 cm) Weight:      218.7 pounds (99.41 kg) BMI:     28.96 Temp:     97.5 degrees F (36.39 degrees C) oral Pulse rate:   81 / minute BP sitting:   156 / 89    Location:   stomach    Intensity:   3    Type:       dull             CBG Result 45     Laboratory Results   Blood Tests   Date/Time Recieved: Dec 19, 2006  3:44 PM  Date/Time Reported: ..................................................................Marland KitchenAlric Quan  Dec 19, 2006 3:44 PM   HGBA1C: 8.3%   (Normal Range: Non-Diabetic - 3-6%   Control Diabetic - 6-8%) CBG Random: 45  Comments: cbg repeated and verified - 2nd cbg result of 42 results given to Dr Noralee Stain Powers RN followed diabetic protocol      Medication Administration  Medication # 1:    Medication: BD Glucose tabs    Diagnosis: DIABETES MELLITUS, TYPE II (ICD-250.00)    Dose: 3 tablets    Route: po    Exp Date: 09/22/2007    Lot #: [id number]    Mfr: BD    Comments: [name] blood sugar 45- pt cold and sweaty - Dr Sherlon Handing informed- given at 3:25 P    Patient tolerated medication without complications    Given by: Dorie Rank RN (Dec 19, 2006 4:32 PM)  Medication # 2:    Medication: BD Glucose  tabs    Diagnosis: DIABETES MELLITUS, TYPE II (ICD-250.00)    Dose: 3 tablets    Route: po    Exp Date: 09/22/2007    Lot #: [id number]    Mfr: BD    Comments: [name] blood sugar 68 at 3:35P - Dr. Sherlon Handing ordered 3 more BD tabs    Patient tolerated medication without complications    Given by: Dorie Rank RN (Dec 19, 2006 4:34 PM)  Orders Added: 1)  T- Capillary Blood Glucose [82948] 2)  T-Hgb A1C (in-house) [83036QW] 3)  Est. Patient Level IV [33295] 4)  T-Comprehensive Metabolic Panel [80053-22900] 5)  T-Lipid Profile [80061-22930] 6)  T-Urine Microalbumin w/creat. ratio [id number] / [id number]] 7)  T-Urinalysis [81003-65000] 8)  BD Glucose tabs [EMRORAL] 9)  BD Glucose tabs [EMRORAL]

## 2010-09-18 NOTE — Progress Notes (Signed)
  Phone Note Call from Patient Call back at Home Phone [phone]   Caller: Patient Summary of Call: phone message received to please return call at [phone]. tried to call- he had already left for his "doctor's office". Initial call taken by: Jamison Neighbor RD,CDE,  July 20, 2008 2:24 PM

## 2010-09-18 NOTE — Assessment & Plan Note (Signed)
Summary: FU/EST/VS   Vital Signs:  Patient Profile:   55 Years Old Male Height:     73 inches (185.42 cm) Weight:      232.2 pounds (105.55 kg) BMI:     30.75 Temp:     97.0 degrees F (36.11 degrees C) oral BP sitting:   134 / 75  (right arm)  Pt. in pain?   no  Vitals Entered By: Filomena Jungling NT II (Jan 04, 2008 11:32 AM)              Nutritional Status BMI of > 30 = obese  Have you ever been in a relationship where you felt threatened, hurt or afraid?No   Does patient need assistance? Functional Status Self care Ambulation Normal     PCP:  Tacey Ruiz MD  Chief Complaint:  FOLLOW-UP VISIT AND LAB RESULTS FROM 4-09  NEED REFILL ON PLAVIX.  History of Present Illness: Pt here for routine f/u.  Pt with some congestion, but improving.  Pt has run out of plavix, will try and obtain new prescriptions by clinic.  Pt having no problems with meds.  Pt still taking lantus daily and SSI around meals.      Prior Medication List:  LANTUS 100 UNIT/ML SOLN (INSULIN GLARGINE) Take 60 units at bedtime for diabetes. PLAVIX 75 MG TABS (CLOPIDOGREL BISULFATE) Take 1 tablet by mouth once a day NIASPAN 500 MG TBCR (NIACIN (ANTIHYPERLIPIDEMIC)) Take 2 tablets by mouth once a day ACCUPRIL 40 MG TABS (QUINAPRIL HCL) Take 1 tablet by mouth once a day for blood pressure VIAGRA 100 MG TABS (SILDENAFIL CITRATE) as needed FLONASE 50 MCG/ACT SUSP (FLUTICASONE PROPIONATE) Give two puffs to each nostril every day FLAX SEED OIL 1000 MG CAPS (FLAXSEED (LINSEED)) Take 1 capsule by mouth two times a day FAMOTIDINE 40 MG TABS (FAMOTIDINE) Take 1 tablet by mouth once a day for upset stomach ASPIR-LOW 81 MG TBEC (ASPIRIN) Take 1 tablet by mouth once a day for circulation NOVOLOG FLEXPEN   SOLN (INSULIN ASPART SOLN) Take 30-35 units with each meal TOPROL XL 100 MG  TB24 (METOPROLOL SUCCINATE) Take 1 tablet by mouth once a day ALLEGRA 180 MG  TABS (FEXOFENADINE HCL) Take 1 tablet by mouth once a  day ALBUTEROL 90 MCG/ACT  AERS (ALBUTEROL) take 2 puffs as needed for respiratory difficulty up to every 3 hourly. HYDROCHLOROTHIAZIDE 12.5 MG  TABS (HYDROCHLOROTHIAZIDE) Take 1 tablet by mouth once a day   Updated Prior Medication List: LANTUS 100 UNIT/ML SOLN (INSULIN GLARGINE) Take 60 units at bedtime for diabetes. PLAVIX 75 MG TABS (CLOPIDOGREL BISULFATE) Take 1 tablet by mouth once a day NIASPAN 500 MG TBCR (NIACIN (ANTIHYPERLIPIDEMIC)) Take 2 tablets by mouth once a day ACCUPRIL 40 MG TABS (QUINAPRIL HCL) Take 1 tablet by mouth once a day for blood pressure VIAGRA 100 MG TABS (SILDENAFIL CITRATE) as needed FLONASE 50 MCG/ACT SUSP (FLUTICASONE PROPIONATE) Give two puffs to each nostril every day FLAX SEED OIL 1000 MG CAPS (FLAXSEED (LINSEED)) Take 1 capsule by mouth two times a day FAMOTIDINE 40 MG TABS (FAMOTIDINE) Take 1 tablet by mouth once a day for upset stomach ASPIR-LOW 81 MG TBEC (ASPIRIN) Take 1 tablet by mouth once a day for circulation NOVOLOG FLEXPEN   SOLN (INSULIN ASPART SOLN) Take 30-35 units with each meal TOPROL XL 100 MG  TB24 (METOPROLOL SUCCINATE) Take 1 tablet by mouth once a day ALLEGRA 180 MG  TABS (FEXOFENADINE HCL) Take 1 tablet by mouth once a day  ALBUTEROL 90 MCG/ACT  AERS (ALBUTEROL) take 2 puffs as needed for respiratory difficulty up to every 3 hourly. HYDROCHLOROTHIAZIDE 12.5 MG  TABS (HYDROCHLOROTHIAZIDE) Take 1 tablet by mouth once a day  Current Allergies: ! * GUAIFENESIN  Past Medical History:    Reviewed history from 07/24/2007 and no changes required:       Diabetes mellitus, type II       Hypertension       Allergic rhinitis       ED       Bronchitis       Hyperlipidemia       hx/o MI, inferior wall s/p stent 2006       Cerumen impaction   Social History:    Reviewed history from 10/21/2007 and no changes required:       Single       Works as Electronics engineer marijuana   Risk Factors: Tobacco use:  quit    Year quit:   only at age 84 Alcohol use:  no Exercise:  yes Seatbelt use:  100 %   Review of Systems  The patient denies anorexia, fever, weight loss, weight gain, vision loss, decreased hearing, hoarseness, chest pain, syncope, dyspnea on exhertion, peripheral edema, prolonged cough, hemoptysis, abdominal pain, melena, hematochezia, and severe indigestion/heartburn.     Physical Exam  General:     alert, well-developed, well-nourished, and well-hydrated.   Head:     normocephalic, atraumatic, and no abnormalities observed.  Mild maxillary tenderness, no frontal tenderness. Eyes:     vision grossly intact Ears:     no external deformities.  hearing grossly intact Nose:     no external deformity, no external erythema, and no nasal discharge.   Mouth:     pharynx pink and moist, no erythema, no exudates, no postnasal drip, poor dentition, and teeth missing.   Lungs:     normal respiratory effort, no intercostal retractions, no accessory muscle use, and normal breath sounds.   Heart:     normal rate, regular rhythm, and no murmur.   Abdomen:     soft, non-tender, normal bowel sounds, no distention, no masses, and no guarding.   Msk:     normal ROM.  normal strength and tone Extremities:     no lower extr edema Neurologic:     alert & oriented X3.      Impression & Recommendations:  Problem # 1:  HYPERTENSION (ICD-401.9) BP still not at goal, <130/80, but close.  If not at goal next time, will increase HCTZ to 25 daily.  Will check BMP today to follow K on HCTZ. f/u 1 month  His updated medication list for this problem includes:    Accupril 40 Mg Tabs (Quinapril hcl) .Marland Kitchen... Take 1 tablet by mouth once a day for blood pressure    Toprol Xl 100 Mg Tb24 (Metoprolol succinate) .Marland Kitchen... Take 1 tablet by mouth once a day    Hydrochlorothiazide 12.5 Mg Tabs (Hydrochlorothiazide) .Marland Kitchen... Take 1 tablet by mouth once a day  BP today: 134/75 Prior BP: 135/79 (12/07/2007)  Labs Reviewed: Creat:  0.97 (12/07/2007) Chol: 143 (07/24/2007)   HDL: 36 (07/24/2007)   LDL: 87 (07/24/2007)   TG: 100 (07/24/2007)   Problem # 2:  DIABETES MELLITUS, TYPE II (ICD-250.00) No problems with medications.  Compliant.  Will check hgb a1c in 1 month.  His updated medication list for this problem includes:    Lantus 100 Unit/ml Soln (  Insulin glargine) .Marland Kitchen... Take 60 units at bedtime for diabetes.    Accupril 40 Mg Tabs (Quinapril hcl) .Marland Kitchen... Take 1 tablet by mouth once a day for blood pressure    Aspir-low 81 Mg Tbec (Aspirin) .Marland Kitchen... Take 1 tablet by mouth once a day for circulation    Novolog Flexpen Soln (Insulin aspart soln) .Marland Kitchen... 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  Clinic trying to obtain meds, but pt not bringing tax forms.  Represcribed today and educated on importance of daily plavix in prevention of restenosis.  Pt currently w/o complaint. No CP/SOB  His updated medication list for this problem includes:    Plavix 75 Mg Tabs (Clopidogrel bisulfate) .Marland Kitchen... Take 1 tablet by mouth once a day    Accupril 40 Mg Tabs (Quinapril hcl) .Marland Kitchen... Take 1 tablet by mouth once a day for blood pressure    Aspir-low 81 Mg Tbec (Aspirin) .Marland Kitchen... Take 1 tablet by mouth once a day for circulation    Toprol Xl 100 Mg Tb24 (Metoprolol succinate) .Marland Kitchen... Take 1 tablet by mouth once a day    Hydrochlorothiazide 12.5 Mg Tabs (Hydrochlorothiazide) .Marland Kitchen... Take 1 tablet by mouth once a day   Complete Medication List: 1)  Lantus 100 Unit/ml Soln (Insulin glargine) .... Take 60 units at bedtime for diabetes. 2)  Plavix 75 Mg Tabs (Clopidogrel bisulfate) .... Take 1 tablet by mouth once a day 3)  Niaspan 500 Mg Tbcr (Niacin (antihyperlipidemic)) .... Take 2 tablets by mouth once a day 4)  Accupril 40 Mg Tabs (Quinapril hcl) .... Take 1 tablet by mouth once a day for blood  pressure 5)  Viagra 100 Mg Tabs (Sildenafil citrate) .... As needed 6)  Flonase 50 Mcg/act Susp (Fluticasone propionate) .... Give two puffs to each nostril every day 7)  Flax Seed Oil 1000 Mg Caps (Flaxseed (linseed)) .... Take 1 capsule by mouth two times a day 8)  Famotidine 40 Mg Tabs (Famotidine) .... Take 1 tablet by mouth once a day for upset stomach 9)  Aspir-low 81 Mg Tbec (Aspirin) .... Take 1 tablet by mouth once a day for circulation 10)  Novolog Flexpen Soln (Insulin aspart soln) .... Take 30-35 units with each meal 11)  Toprol Xl 100 Mg Tb24 (Metoprolol succinate) .... Take 1 tablet by mouth once a day 12)  Allegra 180 Mg Tabs (Fexofenadine hcl) .... Take 1 tablet by mouth once a day 13)  Albuterol 90 Mcg/act Aers (Albuterol) .... Take 2 puffs as needed for respiratory difficulty up to every 3 hourly. 14)  Hydrochlorothiazide 12.5 Mg Tabs (Hydrochlorothiazide) .... Take 1 tablet by mouth once a day   Patient Instructions: 1)  Please schedule a follow-up appointment in 1 month for hgb A1c check 2)  Continue taking meds as prescribed. 3)  BP looks great today.   Prescriptions: PLAVIX 75 MG TABS (CLOPIDOGREL BISULFATE) Take 1 tablet by mouth once a day  #30 x 6   Entered and Authorized by:   Tacey Ruiz MD   Signed by:   Tacey Ruiz MD on 01/04/2008   Method used:   Print then Give to Patient   RxID:   [id number]  ]

## 2010-09-18 NOTE — Assessment & Plan Note (Signed)
Summary: dsmt/dmr   Vital Signs:  Patient Profile:   55 Years Old Male Height:     73 inches (185.42 cm) Weight:      223.5 pounds BMI:     29.59             Is Patient Diabetic? Yes  Nutritional Status BMI of 25 - 29 = overweight CBG Result 134 CBG Device ID one touch Comments Felt like blood sugar was dropping so retested CBG. Result:187 after 20 units Novolog & sausage, egg & cheese sandwich & diet drink for lunch     Current Allergies: No known allergies            ]  Vital Signs Todays Weight: 223.5lb  in   BMI 29.59in-lbs   Assessment Work Hours: Full Time Daily activities: watches TV and eats from 12 midnight -  ~ 5am sleeps from 5-1PM Sources of Support: daughters Special needs or Barriers:  pt reports difficulty with reading comprehension. has learning diference. Difficult to get him to focus.  Current Insulin Use   Rapid/Short Insulin Type:Novolog  Lunch Dose: ~20 units  Bedtime Dose: ~ 10 units  Long Acting  Insulin Type:Lantus  Lunch Dose:71 units   Currently using Insulin Pump? No  Monitoring Self monitoring blood glucose 3 times a day Measures urine ketones? No Name of Meter  one touch Wears Medical I.D. Yes   Car[employer] for Low Blood sugar No   Can you tell if your blood sugar is low? Yes  Recent Episodes of: DKA: No Hyperglycemia : No Hypoglycemia: Yes HHNK: No Severe Hypoglycemia : No  Other Assessment Had an amputation due to diabetes? No Ever had a foot ulcer or infection?           No Performs daily self-foot exams: No     Fat: Excessive fat intake Salt: excessive sodium intake  Nutrition assessment ETOH : No How many meals per week do you eat away from home?   6 or more If you eat away from home , what type of restaurant do you choose?  Fast food What beverages do you drink?  more diet drinks than previously Biggest challenge to eating healthy: Imbalance Fats & skippping meals  Activity  Limitations  Inadequate physical activity Would you  say you are physically active: No  Type of physical activity  Little  Work  Barriers  Not motivated  Medications State name-action-dose-duration-side effects-and time to take medication: Financial controller appropriate timing of food related to medication: Demonstrates competency Demonstrates/verbalizes site selection and rotation for injections Demonstrates competency Correctly draw up and administer insulin-Byetta-Symlin-glucagon: Demonstrates competency State insulin adjustment guidelines: Needs review/assistance  Describe safe needle/lancet disposal: Regulatory affairs officer purpose and frequency of monitoring BG-ketones-HgbA1C and when to contact health care team with results: Demonstrates competency Perform glucose monitoring/ketone testing and record results correctly: Demonstrates competency State target blood glucose and HgbA1C goals: Demonstrates competency  Complications State the causes-signs and symptoms and prevention of Hyperglycemia: Demonstrates competency Explain proper treatment of hyperglycemia: Demonstrates competency State the causes- signs and symptoms and prevention of hypoglycemia: Demonstrates competency Explain proper treatment of hypoglycemia: Demonstrates competency State the relationship between blood glucose control and the development/prevention of long-term complications: Demonstrates competency State benefits-risks-and options for improving blood sugar control: Demonstrates competency State the relationship between blood pressure and lipid control in the prevention/control of cardiovascular disease: Demonstrates competency State the principles of skin-dental and foot care: Needs review/assistance Describe symptoms of skin and foot  problems and describe foot exam: Needs review/assistance State when to seek medical advice and treatment: Needs  review/assistance  Exercise States importance of exercise: Demonstrates competency States effect of exercise on blood glucose: Demonstrates competency Verbalizes safety measures for exercise related to diabetes: Needs review/assistance  Lifestyle changes:Goal setting and Problem solving State benefits of making appropriate lifestyle changes: Demonstrates competency Identify lifestyle behaviors that need to change: Demonstrates competency Identify risk factors that interfere with health: Demonstrates competency Develop strategies to reduce risk factors: Needs review/assistance Verbalize need for and frequency of health care follow-up: Demonstrates competency List at least two appropriate community resources: Needs review/assistance Identify Family/SO role in managing diabetes: Needs review/assistance  Psychosocial Adjustment State three common feelings that might be experienced when learning to cope with diabetes: Needs review/assistance Identify two methods to cope with these feelings: Needs review/assistance Identify how stress affects diabetes & two sources of stress: Needs review/assistance Name two ways of obtaining support from family/friends: Demonstrates competency Diabetes Management Education Done: 06/23/2007 Date of Diabetic Education: 06/23/2007    BEHAVIORAL GOAL FOLLOW UP Incorporating physical activity into lifestyle: Sometimes      Reports CBG of 36 on Sunday about 20 hours after lantus and with no concurrent Novolog.   Cleans hands frequently and reports that he is very fearful of contracting diseases. Reports perfectionist, all or none, previous behavior pattern. Self confidence very low , self rated as a "1". Unable to successfully assist patient with exploring options for increasing this.  Did not bring meter today because he didn\'t want us to see his CBgs.  Continues to eat out often and chooses high fat foods.  Not exercising, although picked this as a goal  for self improvement today. Is in prepartaion stage, therfore not quite ready to make goal for action.   Desires to f/u in 2 months to reassess stage fo change and readiness for action on his goal. Feel group visit may be helpful for support.    Last LDL:                                                 92  (12/19/2006 9:54:00 PM)

## 2010-09-18 NOTE — Assessment & Plan Note (Signed)
Summary: 2WK  FU/PEACOCK/VS   Vital Signs:  Patient Profile:   55 Years Old Male Height:     73 inches (185.42 cm) Weight:      227.04 pounds (103.20 kg) BMI:     30.06 Temp:     98.6 degrees F (37.00 degrees C) oral Pulse rate:   72 / minute BP sitting:   135 / 79  (right arm)  Vitals Entered By: Angelina Ok RN (December 07, 2007 3:27 PM)             Is Patient Diabetic? Yes  Nutritional Status BMI of > 30 = obese CBG Result 222  Does patient need assistance? Functional Status Self care Ambulation Normal     PCP:  Tacey Ruiz MD  Chief Complaint:  Couging up thick mucous.  History of Present Illness: 55 y/o with Past Medical History: Diabetes mellitus, type II, Hypertension, Hyperlipidemia, hx/o MI, inferior wall s/p stent 2006, comes in for a follow up on his BP and DM. He relates no side effect from his medication.  He dis not bring his meter today.  He has been taking his medication reliogiously.      Current Allergies: ! * GUAIFENESIN    Risk Factors: Tobacco use:  quit    Year quit:  only at age 36 Alcohol use:  no Exercise:  yes Seatbelt use:  100 %   Review of Systems  The patient denies anorexia, fever, weight loss, vision loss, hoarseness, dyspnea on exhertion, peripheral edema, prolonged cough, hemoptysis, and abdominal pain.     Physical Exam  General:     alert, well-developed, well-nourished, and well-hydrated.   Lungs:     normal respiratory effort, no intercostal retractions, no accessory muscle use, and normal breath sounds.   Heart:     normal rate, regular rhythm, and no murmur.      Impression & Recommendations:  Problem # 1:  HYPERTENSION (ICD-401.9) Bp well control will check a b-met and will follow up in 3 week. Only 2 weeks on HCZT. and BP 136/78 I am sure next time he comes in his BP will be < 130/80.  His updated medication list for this problem includes:    Accupril 40 Mg Tabs (Quinapril hcl) .Marland Kitchen... Take 1 tablet by  mouth once a day for blood pressure    Toprol Xl 100 Mg Tb24 (Metoprolol succinate) .Marland Kitchen... Take 1 tablet by mouth once a day    Hydrochlorothiazide 12.5 Mg Tabs (Hydrochlorothiazide) .Marland Kitchen... Take 1 tablet by mouth once a day   Problem # 2:  DIABETES MELLITUS, TYPE II (ICD-250.00) Pt did not bring his meter, I will scheduele and appointmet with his PCP in  1 month.  I have told him to bring his meter. His updated medication list for this problem includes:    Lantus 100 Unit/ml Soln (Insulin glargine) .Marland Kitchen... Take 60 units at bedtime for diabetes.    Accupril 40 Mg Tabs (Quinapril hcl) .Marland Kitchen... Take 1 tablet by mouth once a day for blood pressure    Aspir-low 81 Mg Tbec (Aspirin) .Marland Kitchen... Take 1 tablet by mouth once a day for circulation    Novolog Flexpen Soln (Insulin aspart soln) .Marland Kitchen... Take 30-35 units with each meal  Orders: Capillary Blood Glucose (82948) Fingerstick (57846)   Complete Medication List: 1)  Lantus 100 Unit/ml Soln (Insulin glargine) .... Take 60 units at bedtime for diabetes. 2)  Plavix 75 Mg Tabs (Clopidogrel bisulfate) .... Take 1 tablet by mouth once a  day 3)  Niaspan 500 Mg Tbcr (Niacin (antihyperlipidemic)) .... Take 2 tablets by mouth once a day 4)  Accupril 40 Mg Tabs (Quinapril hcl) .... Take 1 tablet by mouth once a day for blood pressure 5)  Viagra 100 Mg Tabs (Sildenafil citrate) .... As needed 6)  Flonase 50 Mcg/act Susp (Fluticasone propionate) .... Give two puffs to each nostril every day 7)  Flax Seed Oil 1000 Mg Caps (Flaxseed (linseed)) .... Take 1 capsule by mouth two times a day 8)  Famotidine 40 Mg Tabs (Famotidine) .... Take 1 tablet by mouth once a day for upset stomach 9)  Aspir-low 81 Mg Tbec (Aspirin) .... Take 1 tablet by mouth once a day for circulation 10)  Novolog Flexpen Soln (Insulin aspart soln) .... Take 30-35 units with each meal 11)  Toprol Xl 100 Mg Tb24 (Metoprolol succinate) .... Take 1 tablet by mouth once a day 12)  Allegra 180 Mg Tabs  (Fexofenadine hcl) .... Take 1 tablet by mouth once a day 13)  Albuterol 90 Mcg/act Aers (Albuterol) .... Take 2 puffs as needed for respiratory difficulty up to every 3 hourly. 14)  Hydrochlorothiazide 12.5 Mg Tabs (Hydrochlorothiazide) .... Take 1 tablet by mouth once a day  Other Orders: T-Basic Metabolic Panel [phone])   Patient Instructions: 1)  Please schedule an appointment with your primary doctor in : 1 month 2)  It is important that your Diabetic A1c level is checked every 3 months. 3)  Check your blood sugars regularly. If your readings are usually above : or below 70 you should contact our office.    ]

## 2010-09-18 NOTE — Assessment & Plan Note (Signed)
Summary: check up from last visit [mkj]   Vital Signs:  Patient profile:   54 year old male Height:      73 inches (185.42 cm) Weight:      224.04 pounds (101.84 kg) BMI:     29.67 Temp:     976.4 degrees F (524.67 degrees C) oral Pulse rate:   83 / minute BP sitting:   135 / 71  (right arm)  Vitals Entered By: Angelina Ok RN (November 14, 2008 3:42 PM) Is Patient Diabetic? Yes  Pain Assessment Patient in pain? yes     Location: face, ears Intensity: 4 Type: aching Onset of pain  Constant Nutritional Status BMI of 25 - 29 = overweight CBG Result 341  Have you ever been in a relationship where you felt threatened, hurt or afraid?No   Does patient need assistance? Functional Status Self care Ambulation Normal Comments Check up   Primary Care Provider:  Tacey Ruiz MD   History of Present Illness: [name] is a 55 yo man with PMH as outlined in the EMR. He comes today for following isues:  1) DM: He is checking CBG 3 times a day. Lowest was 65 and highest one was 319. He did have some symptoms when his blood sugar was low: he develops some tremors and sweating when his blood sugar appoaches 60s. Today in the offie his CBG runs 341.   2) Memory Loss: He's doing better with Coreg.   3). CAD: Saw Dr. Myrtis Ser last month. Mr. Deutschman wants to continue on plavix and ASA both. He's cleared to start his exercise.   4) HL: Not taking his statins because of increased liver enzymes.     Allergies: No Known Drug Allergies  Review of Systems      See HPI  Physical Exam  General:  alert.   Ears:  R cerumen impaction.   Mouth:  pharynx pink and moist.   Lungs:  normal breath sounds, no crackles, and no wheezes.   Heart:  normal rate, regular rhythm, no murmur, no gallop, and no rub.   Extremities:  trace left pedal edema and trace right pedal edema.   Neurologic:  alert & oriented X3.     Impression & Recommendations:  Problem # 1:  UNSPECIFIED DISORDER OF LIVER  (ICD-573.9) Check LFT tomorrow. If still elevated will pursue with RUQ USG. If normal ok to start crestor 5 mg daily, but with close f/u.   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 says better with coreg.   Problem # 3:  PERCUTANEOUS TRANSLUMINAL CORONARY ANGIOPLASTY, HX OF (ICD-V45.82) See note from Dr. Myrtis Ser. Pt. wants to continue plavix. No h/o black stool. I mentioned about the possible side effects and asked him to inform the office if he notices any adr. Pt. planning to use bike to commute to work, 14 miles one way. I asked him to start slowly and inform people at his work or home while he's doing so. Pt. spoke with his cardiologist about the plan.  His updated medication list for this problem includes:    Plavix 75 Mg Tabs (Clopidogrel bisulfate) .Marland Kitchen... Take 1 tablet by mouth once a day    Accupril 40 Mg Tabs (Quinapril hcl) .Marland Kitchen... Take 1 tablet by mouth once a day for blood pressure    Aspir-low 81 Mg Tbec (Aspirin) .Marland Kitchen... Take 1 tablet by mouth once a day for circulation    Coreg 6.25 Mg Tabs (Carvedilol) .Marland Kitchen... Take 1  tablet by mouth two times a day    Hydrochlorothiazide 25 Mg Tabs (Hydrochlorothiazide) .Marland Kitchen... Take 1 tablet by mouth once a day  Problem # 4:  ERECTILE DYSFUNCTION, ORGANIC (ICD-607.84) refilled his viagra.  His updated medication list for this problem includes:    Viagra 100 Mg Tabs (Sildenafil citrate) .Marland Kitchen... As needed  Problem # 5:  HYPERLIPIDEMIA, MIXED (ICD-272.2) Check FLP and liver panel. If liver fn. normal start crestor 5 mg daily.  His updated medication list for this problem includes:    Niaspan 1000 Mg Cr-tabs (Niacin (antihyperlipidemic)) .Marland Kitchen... Take 2 tablet by mouth once a day  Orders: T-Comprehensive Metabolic Panel ([id number]) T-Lipid Profile ([id number])  Problem # 6:  CORONARY ARTERY DISEASE (ICD-414.00) Denies CP or SOB.  His updated medication list for this problem includes:    Plavix 75 Mg Tabs (Clopidogrel bisulfate) .Marland Kitchen... Take  1 tablet by mouth once a day    Accupril 40 Mg Tabs (Quinapril hcl) .Marland Kitchen... Take 1 tablet by mouth once a day for blood pressure    Aspir-low 81 Mg Tbec (Aspirin) .Marland Kitchen... Take 1 tablet by mouth once a day for circulation    Coreg 6.25 Mg Tabs (Carvedilol) .Marland Kitchen... Take 1 tablet by mouth two times a day    Hydrochlorothiazide 25 Mg Tabs (Hydrochlorothiazide) .Marland Kitchen... Take 1 tablet by mouth once a day  Problem # 7:  CERUMEN REMOVAL, HX OF (ICD-V15.89) Cerumen impacted on right ear and I recommended Deborex ear drop. If still not better on next appt. will try removing it in the office.   Problem # 8:  HYPERTENSION (ICD-401.9) BP not at goal, but almost there. Was low when I saw last time, so will continue the same. If needed can increase the dose of coreg, after closely watching his pulse.  His updated medication list for this problem includes:    Accupril 40 Mg Tabs (Quinapril hcl) .Marland Kitchen... Take 1 tablet by mouth once a day for blood pressure    Coreg 6.25 Mg Tabs (Carvedilol) .Marland Kitchen... Take 1 tablet by mouth two times a day    Hydrochlorothiazide 25 Mg Tabs (Hydrochlorothiazide) .Marland Kitchen... Take 1 tablet by mouth once a day  Orders: T-Comprehensive Metabolic Panel ([id number])  BP today: 135/71 Prior BP: 134/71 (09/28/2008)  Labs Reviewed: K+: 4.4 (09/14/2008) Creat: : 1.12 (09/14/2008)   Chol: 151 (04/08/2008)   HDL: 36 (04/08/2008)   LDL: 91 (04/08/2008)   TG: 122 (04/08/2008)  Problem # 9:  DIABETES MELLITUS, TYPE II (ICD-250.00) Home CBG ranges from 65 to 319. Had 3 readings on 60's. Pt. had some symptoms then. I told the pt. to cut down on his lantus. He says all of these readings are at work ( work hour is 5.30 PM to 11 PM). He didn't eat at work and so he's having these low readings. Eating at work is not a problem for him. He is very motivated to eat at work and to keep his meds same for now. As long as he eats at work, I think we can stay with his current regimen. We will see in a month. Has an appt.  with his podiatrist in 2 days. Will see D. Victory Dakin tomorrow.  His updated medication list for this problem includes:    Accupril 40 Mg Tabs (Quinapril hcl) .Marland Kitchen... Take 1 tablet by mouth once a day for blood pressure    Aspir-low 81 Mg Tbec (Aspirin) .Marland Kitchen... Take 1 tablet by mouth once a day for circulation    Novolin R 100 Unit/ml  Soln (Insulin regular human) ..... Inject 30-35 units subcutaneously with each meal    Lantus 100 Unit/ml Soln (Insulin glargine) .Marland Kitchen... Take 50 units daily.  Orders: T- Capillary Blood Glucose ([id number]) T-Hgb A1C (in-house) ([id number])  Labs Reviewed: Creat: 1.12 (09/14/2008)     Last Eye Exam: No diabetic retinopathy.   Visual acuity OD (best corrected): 20/25 Visual acuity OS (best corrected):     20/30 Intraocular pressure OD:     19 Intraocular pressure OS:     27 Dr Dione Booze f/u 1 yr  (06/03/2007) Reviewed HgBA1c results: 9.5 (11/14/2008)  9.1 (07/20/2008)  Complete Medication List: 1)  Plavix 75 Mg Tabs (Clopidogrel bisulfate) .... Take 1 tablet by mouth once a day 2)  Niaspan 1000 Mg Cr-tabs (Niacin (antihyperlipidemic)) .... Take 2 tablet by mouth once a day 3)  Accupril 40 Mg Tabs (Quinapril hcl) .... Take 1 tablet by mouth once a day for blood pressure 4)  Viagra 100 Mg Tabs (Sildenafil citrate) .... As needed 5)  Flax Seed Oil 1000 Mg Caps (Flaxseed (linseed)) .... Take 1 capsule by mouth two times a day 6)  Famotidine 40 Mg Tabs (Famotidine) .... Take 1 tablet by mouth once a day for upset stomach 7)  Aspir-low 81 Mg Tbec (Aspirin) .... Take 1 tablet by mouth once a day for circulation 8)  Coreg 6.25 Mg Tabs (Carvedilol) .... Take 1 tablet by mouth two times a day 9)  Albuterol 90 Mcg/act Aers (Albuterol) .... Take 2 puffs as needed for respiratory difficulty up to every 3 hourly. 10)  Hydrochlorothiazide 25 Mg Tabs (Hydrochlorothiazide) .... Take 1 tablet by mouth once a day 11)  Sudafed Pe Sinus/allergy 4-10 Mg Tabs (Chlorpheniramine-phenylephrine)  .... Take 1 tablet by mouth two times a day 12)  Nasonex 50 Mcg/act Susp (Mometasone furoate) .... Apply on spray on each nostril daily. 13)  Novolin R 100 Unit/ml Soln (Insulin regular human) .... Inject 30-35 units subcutaneously with each meal 14)  Insulin Syringe 30g X 5/16" 1 Ml Misc (Insulin syringe-needle u-100) .... Use to inject insulin 15)  Truetrack Test Strp (Glucose blood) .... Use to test blood glucsoe 3x daily 16)  Lancets Thin Misc (Lancets) .... Use to test blood glucose 3x daily 17)  Singulair 10 Mg Tabs (Montelukast sodium) .... Take 1 tablet by mouth once a day 18)  Lantus 100 Unit/ml Soln (Insulin glargine) .... Take 50 units daily. 19)  Allegra 180 Mg Tabs (Fexofenadine hcl) .... Take 1 pill by mouth daily as needed for allergy 20)  Debrox 6.5 % Soln (Carbamide peroxide) .... Put in each ear twice daily.  Patient Instructions: 1)  Please schedule a follow-up appointment in 1 month. 2)  Limit your Sodium (Salt) to less than 2 grams a day(slightly less than 1/2 a teaspoon) to prevent fluid retention, swelling, or worsening of symptoms. 3)  It is important that you exercise regularly at least 20 minutes 5 times a week. If you develop chest pain, have severe difficulty breathing, or feel very tired , stop exercising immediately and seek medical attention. 4)  You need to lose weight. Consider a lower calorie diet and regular exercise.  5)  Check your blood sugars regularly. If your readings are usually above : or below 70 you should contact our office. 6)  It is important that your Diabetic A1c level is checked every 3 months. 7)  See your eye doctor yearly to check for diabetic eye damage. 8)  Check your feet each night for sore  areas, calluses or signs of infection. 9)  Check your Blood Pressure regularly. If it is above: you should make an appointment. Prescriptions: DEBROX 6.5 % SOLN (CARBAMIDE PEROXIDE) put in each ear twice daily.  #1 mo supply x 1   Entered and  Authorized by:   Jason Coop MD   Signed by:   Jason Coop MD on 11/14/2008   Method used:   Print then Give to Patient   RxID:   [id number] VIAGRA 100 MG TABS (SILDENAFIL CITRATE) as needed  #15 x 1   Entered and Authorized by:   Jason Coop MD   Signed by:   Jason Coop MD on 11/14/2008   Method used:   Print then Give to Patient   RxID:   [id number]   Laboratory Results   Blood Tests   Date/Time Received: November 14, 2008 4:15 PM  Date/Time Reported: Oren Beckmann  November 14, 2008 4:15 PM   HGBA1C: 9.5%   (Normal Range: Non-Diabetic - 3-6%   Control Diabetic - 6-8%) CBG Random:: 341mg /dL

## 2010-09-18 NOTE — Progress Notes (Signed)
Summary: med refill/gp  Phone Note Refill Request Message from:  Fax from Pharmacy on May 16, 2010 9:27 AM  Refills Requested: Medication #1:  PRANDIN 0.5 MG TABS Take one tablet 15 minutes before meal if you can not take insulin. Last appt. 01/30/10; next appt.06/08/10.   Method Requested: Telephone to Pharmacy Initial call taken by: Chinita Pester RN,  May 16, 2010 9:27 AM  Follow-up for Phone Call        Refill approved-nurse to complete Follow-up by: Julaine Fusi  DO,  May 16, 2010 11:15 AM    Prescriptions: PRANDIN 0.5 MG TABS (REPAGLINIDE) Take one tablet 15 minutes before meal if you can not take insulin.  #32 x 1   Entered and Authorized by:   Julaine Fusi  DO   Signed by:   Julaine Fusi  DO on 05/16/2010   Method used:   Faxed to ...       [facility] (retail)       [address] [place], [place]  [address]       Ph: [phone]       Fax: [phone]   RxID:   [id number]

## 2010-09-18 NOTE — Miscellaneous (Signed)
Summary: [facility]   Imported By: Margie Billet 12/14/2009 11:22:11  _____________________________________________________________________  External Attachment:    Type:   Image     Comment:   External Document

## 2010-09-18 NOTE — Progress Notes (Signed)
Summary: refill/gg  Phone Note Refill Request  on July 25, 2010 3:06 PM  Refills Requested: Medication #1:  PLAVIX 75 MG TABS Take 1 tablet by mouth once a day   Last Refilled: 05/04/2010  Method Requested: Fax to Local Pharmacy Initial call taken by: Merrie Roof RN,  July 25, 2010 3:07 PM  Follow-up for Phone Call        No appt in EMR. I sent a flag to Ms Lissa Hoard to schedule an appt.  Follow-up by: Blanch Media MD,  July 25, 2010 3:08 PM    Prescriptions: PLAVIX 75 MG TABS (CLOPIDOGREL BISULFATE) Take 1 tablet by mouth once a day  #30 x 5   Entered and Authorized by:   Blanch Media MD   Signed by:   Blanch Media MD on 07/25/2010   Method used:   Faxed to ...       [facility] (retail)       [address] [place], [place]  [address]       Ph: [phone]       Fax: [phone]   RxID:   [id number]

## 2010-09-18 NOTE — Assessment & Plan Note (Signed)
Summary: yearly f/u. c/o chest pain under rt breast started on 07-18-10  Medications Added SUDAFED PE SINUS/ALLERGY 4-10 MG  TABS (CHLORPHENIRAMINE-PHENYLEPHRINE) Take 1 tablet by mouth two times a day as needed      Allergies Added: NKDA  Visit Type:  Follow-up Primary Provider:  Leodis Sias MD  CC:  CAD.  History of Present Illness: The patient is seen for cardiology followup. he has no known coronary disease. He is not having any significant chest pain.  He works doing vigorous work.  His lipids are good.  His diet is not optimal but his lipids are good with his treatment.  He does the best he can.  Current Medications (verified): 1)  Plavix 75 Mg Tabs (Clopidogrel Bisulfate) .... Take 1 Tablet By Mouth Once A Day 2)  Niaspan 1000 Mg Cr-Tabs (Niacin (Antihyperlipidemic)) .... Take 2 Tablet By Mouth Once A Day 3)  Accupril 40 Mg Tabs (Quinapril Hcl) .... Take 1 Tablet By Mouth Once A Day For Blood Pressure 4)  Flax Seed Oil 1000 Mg Caps (Flaxseed (Linseed)) .... Take 1 Capsule By Mouth Two Times A Day 5)  Aspir-Low 81 Mg Tbec (Aspirin) .... Take 1 Tablet By Mouth Two Times A Day 6)  Coreg 6.25 Mg Tabs (Carvedilol) .... Take 1 Tablet By Mouth Two Times A Day 7)  Albuterol 90 Mcg/act  Aers (Albuterol) .... Take 2 Puffs As Needed For Respiratory Difficulty Up To Every 3 Hourly. 8)  Hydrochlorothiazide 25 Mg Tabs (Hydrochlorothiazide) .... Take 1 Tablet By Mouth Once A Day 9)  Sudafed Pe Sinus/allergy 4-10 Mg  Tabs (Chlorpheniramine-Phenylephrine) .... Take 1 Tablet By Mouth Two Times A Day As Needed 10)  Nasonex 50 Mcg/act  Susp (Mometasone Furoate) .... Apply On Spray On Each Nostril Daily. 11)  Novolin R 100 Unit/ml Soln (Insulin Regular Human) .... Inject 30 Units Subcutaneously With Each Meal 12)  Insulin Syringe 30g X 5/16" 1 Ml Misc (Insulin Syringe-Needle U-100) .... Use To Inject Insulin 13)  Truetrack Test  Strp (Glucose Blood) .... Use To Test Blood Glucsoe 3x  Daily 14)  Lancets Thin  Misc (Lancets) .... Use To Test Blood Glucose 3x Daily 15)  Singulair 10 Mg Tabs (Montelukast Sodium) .... Take 1 Tablet By Mouth Once A Day 16)  Lantus 100 Unit/ml Soln (Insulin Glargine) .... Take 55 Units Daily. 17)  Xyzal 5 Mg Tabs (Levocetirizine Dihydrochloride) .... Take 1 Pill By Mouth Daily As Needed For Allergy 18)  Crestor 5 Mg Tabs (Rosuvastatin Calcium) .... Take 1 Pill By Mouth Daily. 19)  Prandin 0.5 Mg Tabs (Repaglinide) .... Take One Tablet 15 Minutes Before Meal If You Can Not Take Insulin. 20)  Vicodin 5-500 Mg Tabs (Hydrocodone-Acetaminophen) .... Take 1 Pill By Mouth Three Times A Day As Needed For Pain. 21)  Viagra 100 Mg Tabs (Sildenafil Citrate) .... Take 1/2 Pill By Mouth 1 Hour Before Sexual Activity, No More Than Once Daily.  Allergies (verified): No Known Drug Allergies  Past History:  Past Medical History: Last updated: 03/02/2009 Diabetes mellitus, type II Hypertension Allergic rhinitis ED Bronchitis..chronic by chest x-ray Hyperlipidemia CAD... minivision stent   right coronary artery  2006...( other residual disease)  Myoview 2008  no ischemia.Marland Kitcheninferolateral scar EF 50-55%... 2-D echo 2008... mild hypokinesis  base/mid inferoposterior wall head trauma.... in the past  Review of Systems       Patient denies fever, chills, headache, sweats, rash, change in vision, change in hearing, no chest pain, cough, nausea vomiting, urinary symptoms.  All other systems are reviewed and are negative.  Vital Signs:  Patient profile:   55 year old male Height:      72 inches Weight:      215 pounds BMI:     29.26 Pulse rate:   82 / minute BP sitting:   128 / 66  (left arm) Cuff size:   regular  Vitals Entered By: Hardin Negus, RMA (July 05, 2010 2:00 PM)  Physical Exam  General:  patient is stable. Eyes:  no xanthelasma. Neck:  no jugular venous distention. Lungs:  lungs are clear.  Respiratory effort is not  labored. Heart:  cardiac exam reveals S1 and S2.  No clicks or significant murmurs. Abdomen:  abdomen is soft. Extremities:  no peripheral edema. Psych:  patient is oriented to person time and place. Affect is normal.   Impression & Recommendations:  Problem # 1:  HYPERLIPIDEMIA, MIXED (ICD-272.2)  His updated medication list for this problem includes:    Niaspan 1000 Mg Cr-tabs (Niacin (antihyperlipidemic)) .Marland Kitchen... Take 2 tablet by mouth once a day    Crestor 5 Mg Tabs (Rosuvastatin calcium) .Marland Kitchen... Take 1 pill by mouth daily. Lipids are very well treated.  No change in therapy.  Problem # 2:  HYPERTENSION (ICD-401.9)  His updated medication list for this problem includes:    Accupril 40 Mg Tabs (Quinapril hcl) .Marland Kitchen... Take 1 tablet by mouth once a day for blood pressure    Aspir-low 81 Mg Tbec (Aspirin) .Marland Kitchen... Take 1 tablet by mouth two times a day    Coreg 6.25 Mg Tabs (Carvedilol) .Marland Kitchen... Take 1 tablet by mouth two times a day    Hydrochlorothiazide 25 Mg Tabs (Hydrochlorothiazide) .Marland Kitchen... Take 1 tablet by mouth once a day Blood pressure is well controlled.  No change in therapy.  Problem # 3:  CORONARY ARTERY DISEASE (ICD-414.00)  His updated medication list for this problem includes:    Plavix 75 Mg Tabs (Clopidogrel bisulfate) .Marland Kitchen... Take 1 tablet by mouth once a day    Accupril 40 Mg Tabs (Quinapril hcl) .Marland Kitchen... Take 1 tablet by mouth once a day for blood pressure    Aspir-low 81 Mg Tbec (Aspirin) .Marland Kitchen... Take 1 tablet by mouth two times a day    Coreg 6.25 Mg Tabs (Carvedilol) .Marland Kitchen... Take 1 tablet by mouth two times a day  Orders: EKG w/ Interpretation (93000) Coronary disease is stable.  EKG is done today and reviewed by me.  It is normal.  No further workup.  One-year followup.  Patient Instructions: 1)  Your physician wants you to follow-up in:  1 year.  You will receive a reminder letter in the mail two months in advance. If you don't receive a letter, please call our office to  schedule the follow-up appointment.

## 2010-09-18 NOTE — Assessment & Plan Note (Signed)
Summary: CHECKUP/ SB.   Vital Signs:  Patient Profile:   55 Years Old Male Height:     73 inches (185.42 cm) Weight:      225.0 pounds (102.27 kg) Temp:     97.7 degrees F (36.50 degrees C) oral Pulse rate:   86 / minute BP sitting:   138 / 83  (right arm)  Pt. in pain?   no  Vitals Entered By: Krystal Eaton Duncan Dull) (June 16, 2007 3:19 PM)              Is Patient Diabetic? Yes  CBG Result 268  Does patient need assistance? Functional Status Self care Ambulation Normal     PCP:  Tacey Ruiz MD  Chief Complaint:  routine f/u chronic issues.  History of Present Illness:  Follow-Up Visit      This is a 55 year old man who presents for Follow-up visit.  Pt here for Diabetic f/u.  A1c is higher today, 8.3.  Normal DM regimen pt wakes up around noon, around 3pm takes 60 units lantus, pt then leaves for work 330 and takes 22 units of novolog R and eats on way to work.  Then gets home from work around midnight eat and checks sugar.  Dose novolog based on sugars after eating.  Usually takes 10-12 units novolog R per night.  Pt takes 1unit novolog R for every 25 glucose over 100.  Pt stopped taking metformin about 1 month ago because it upset its stomach.  Pt still following up with Jamison Neighbor.  Feels insulin NPH + Reg may have worked better.  Pt still reports not eating very well, but better than before.  Has cut soda and sweets.  Now drinks green tea more often.  Pt feels like he is gaining too much weight and waying on daily activities.  Pt reports main diet is chicken and mainly hamburgers, mostly fast food, and trying to eat less fried food.  Sugars mainly running 180-250s.  Pt was walking 30 minutes .  The patient denies chest pain, palpitations, dizziness, syncope, low blood sugar symptoms, high blood sugar symptoms, edema, SOB, and DOE.  The patient reports taking meds as prescribed, not monitoring BP, monitoring blood sugars, dietary noncompliance, and exercising  occasionaly.  When questioned about possible medication side effects, the patient notes GI upset.     Current Allergies (reviewed today): No known allergies  Updated/Current Medications (including changes made in today's visit):  LANTUS 100 UNIT/ML SOLN (INSULIN GLARGINE) Take 70 units at bedtime for diabetes. PLAVIX 75 MG TABS (CLOPIDOGREL BISULFATE) Take 1 tablet by mouth once a day NIASPAN 500 MG TBCR (NIACIN (ANTIHYPERLIPIDEMIC)) Take 2 tablets by mouth once a day ACCUPRIL 40 MG TABS (QUINAPRIL HCL) Take 1 tablet by mouth once a day for blood pressure VIAGRA 100 MG TABS (SILDENAFIL CITRATE) as needed FLONASE 50 MCG/ACT SUSP (FLUTICASONE PROPIONATE) Give one puff to each nostril every day FLAX SEED OIL 1000 MG CAPS (FLAXSEED (LINSEED)) Take 1 capsule by mouth two times a day FAMOTIDINE 40 MG TABS (FAMOTIDINE) Take 1 tablet by mouth once a day for upset stomach ASPIR-LOW 81 MG TBEC (ASPIRIN) Take 1 tablet by mouth once a day for circulation NOVOLOG FLEXPEN   SOLN (INSULIN ASPART SOLN) Take 10 units with each meal TOPROL XL 100 MG  TB24 (METOPROLOL SUCCINATE) Take 1 tablet by mouth once a day     Risk Factors: Tobacco use:  quit    Year quit:  only at age  17   Review of Systems  The patient denies anorexia, fever, weight loss, vision loss, decreased hearing, hoarseness, chest pain, syncope, dyspnea on exhertion, peripheral edema, prolonged cough, hemoptysis, abdominal pain, melena, hematochezia, severe indigestion/heartburn, and hematuria.     Physical Exam  General:     Well-developed,well-nourished,in no acute distress; alert,appropriate and cooperative throughout examination Head:     normocephalic, atraumatic, no abnormalities observed, and no abnormalities palpated.   Eyes:     vision grossly intact, pupils equal, pupils round, and pupils reactive to light.   Ears:     R ear normal and L ear normal.   Nose:     no external deformity, no external erythema, and no nasal  discharge.   Mouth:     pharynx pink and moist, no erythema, no exudates, poor dentition, and teeth missing.   Neck:     supple, full ROM, and no masses.   Lungs:     normal respiratory effort, no intercostal retractions, no accessory muscle use, normal breath sounds, and no wheezes.   Heart:     normal rate, regular rhythm, no murmur, and no gallop.   Abdomen:     soft, non-tender, normal bowel sounds, no distention, no masses, no guarding, and distended.   Msk:     normal ROM and no joint tenderness.   Pulses:     R radial normal and L radial normal.   Extremities:     No clubbing, cyanosis, edema, or deformity noted with normal full range of motion of all joints.   Neurologic:     grossly intact    Impression & Recommendations:  Problem # 1:  DIABETES MELLITUS, TYPE II (ICD-250.00) Pt DM still not controlled.  Poor control multifactorial.  Pt not compliant with meds.  Not tolerating metformin 2/2 GI upset.  Taking lantus 60 daily and SSI 2x with meals.  Pt also has very poor diet, mostly fast food, hamburgers.  And is getting very little exercise.  Pt stopped walking 30 minutes per day about 1 month ago.  Today we will stop metformin as patient not tolerating drug.  We will increase lantus to 70units and cont ISS with meals.  Pt stressed importance of checking sugars and adjusting ISS so he does not become hypoglycemic.  Pt acknowledges understanding.  Willget f/u with Jamison Neighbor in 1 week for continued education and f/u here in clinic in December and recheck A1c.  Discussed dietary habits and need to not splurge just because we can control sugars.  Also need to cut down on fried foods and hamburgers.  Pt says he will try to eat grilled more and begin walking again.  The following medications were removed from the medication list:    Glucophage 1000 Mg Tabs (Metformin hcl) .Marland Kitchen... Take 1 tablet by mouth two times a day  His updated medication list for this problem includes:    Lantus  100 Unit/ml Soln (Insulin glargine) .Marland Kitchen... Take 70 units at bedtime for diabetes.    Accupril 40 Mg Tabs (Quinapril hcl) .Marland Kitchen... Take 1 tablet by mouth once a day for blood pressure    Aspir-low 81 Mg Tbec (Aspirin) .Marland Kitchen... Take 1 tablet by mouth once a day for circulation    Novolog Flexpen Soln (Insulin aspart soln) .Marland Kitchen... Take 10 units with each meal  Orders: T-Hgb A1C (in-house) (16109UE) T- Capillary Blood Glucose (45409)  Future Orders: T-Lipid Profile ([id number]) ... 06/19/2007 T-CBC No Diff ([id number]) ... 06/19/2007 T-Basic Metabolic Panel [phone]) .Marland KitchenMarland Kitchen  06/19/2007  Labs Reviewed: HgBA1c: 8.3 (06/16/2007)   Creat: 0.87 (04/24/2007)      Problem # 2:  HYPERTENSION (ICD-401.9) Pt BP improved on current doses will cont and follow.  Pt just began taking incrased doses of BB 1 week ago. His updated medication list for this problem includes:    Accupril 40 Mg Tabs (Quinapril hcl) .Marland Kitchen... Take 1 tablet by mouth once a day for blood pressure    Toprol Xl 100 Mg Tb24 (Metoprolol succinate) .Marland Kitchen... Take 1 tablet by mouth once a day  Future Orders: T-Lipid Profile ([id number]) ... 06/19/2007 T-CBC No Diff ([id number]) ... 06/19/2007 T-Basic Metabolic Panel [id number]) ... 06/19/2007  BP today: 138/83 Prior BP: 147/82 (03/24/2007)  Labs Reviewed: Creat: 0.87 (04/24/2007) Chol: 158 (12/19/2006)   HDL: 43 (12/19/2006)   LDL: 92 (12/19/2006)   TG: 113 (12/19/2006)   Complete Medication List: 1)  Lantus 100 Unit/ml Soln (Insulin glargine) .... Take 70 units at bedtime for diabetes. 2)  Plavix 75 Mg Tabs (Clopidogrel bisulfate) .... Take 1 tablet by mouth once a day 3)  Niaspan 500 Mg Tbcr (Niacin (antihyperlipidemic)) .... Take 2 tablets by mouth once a day 4)  Accupril 40 Mg Tabs (Quinapril hcl) .... Take 1 tablet by mouth once a day for blood pressure 5)  Viagra 100 Mg Tabs (Sildenafil citrate) .... As needed 6)  Flonase 50 Mcg/act Susp (Fluticasone propionate) .... Give one  puff to each nostril every day 7)  Flax Seed Oil 1000 Mg Caps (Flaxseed (linseed)) .... Take 1 capsule by mouth two times a day 8)  Famotidine 40 Mg Tabs (Famotidine) .... Take 1 tablet by mouth once a day for upset stomach 9)  Aspir-low 81 Mg Tbec (Aspirin) .... Take 1 tablet by mouth once a day for circulation 10)  Novolog Flexpen Soln (Insulin aspart soln) .... Take 10 units with each meal 11)  Toprol Xl 100 Mg Tb24 (Metoprolol succinate) .... Take 1 tablet by mouth once a day  Other Orders: Influenza Vaccine NON MCR (01027)   Patient Instructions: 1)  Please schedule a follow-up appointment in 1 week with Jamison Neighbor. 2)  Please schedule a follow-up appointment in 2-3 months with Dr Kerby Nora 3)  Please return for lab work one (1) week before your next appointment.  4)  Increase Lantus to 70 units daily, cont Insulin R sliding scale and monitoring sugars. 5)  Stop metformin    ]  Last LDL:                                                 92 (12/19/2006 9:54:00 PM)         Laboratory Results   Blood Tests   Date/Time Recieved: June 16, 2007 3:43 PM  Date/Time Reported: ..................................................................Marland KitchenAlric Quan  June 16, 2007 3:43 PM   HGBA1C: 8.3%   (Normal Range: Non-Diabetic - 3-6%   Control Diabetic - 6-8%) CBG Random: 268      Influenza Vaccine    Vaccine Type: Fluvax Non-MCR    Site: left deltoid    Mfr: NOVARTIS    Dose: 0.5 ml    Route: IM    Given by: Krystal Eaton (AAMA)    Exp. Date: 02/16/2008    Lot #: [id number]    VIS given: 02/15/05 version given June 16, 2007.  Flu Vaccine Consent Questions  Do you have a history of severe allergic reactions to this vaccine? no    Any prior history of allergic reactions to egg and/or gelatin? no    Do you have a sensitivity to the preservative Thimersol? no    Do you have a past history of Guillan-Barre Syndrome? no    Do you currently have an acute febrile  illness? no    Have you ever had a severe reaction to latex? no    Vaccine information given and explained to patient? yes

## 2010-09-18 NOTE — Progress Notes (Signed)
Summary: REfill/gh  Phone Note Refill Request Message from:  Fax from Pharmacy on August 22, 2009 10:43 AM  Refills Requested: Medication #1:  HYDROCHLOROTHIAZIDE 25 MG TABS Take 1 tablet by mouth once a day   Last Refilled: 07/19/2009  Method Requested: Electronic Initial call taken by: Angelina Ok RN,  August 22, 2009 10:43 AM  Follow-up for Phone Call       Follow-up by: Jason Coop MD,  August 22, 2009 11:10 PM    Prescriptions: HYDROCHLOROTHIAZIDE 25 MG TABS (HYDROCHLOROTHIAZIDE) Take 1 tablet by mouth once a day  #30 x 2   Entered and Authorized by:   Jason Coop MD   Signed by:   Jason Coop MD on 08/22/2009   Method used:   Faxed to ...       [facility] (retail)       [address] [place], [place]  [address]       Ph: [phone]       Fax: [phone]   RxID:   [id number]

## 2010-09-18 NOTE — Assessment & Plan Note (Signed)
Summary: bite to arm/gg   Vital Signs:  Patient profile:   55 year old male Height:      72 inches (182.88 cm) Weight:      223.0 pounds (101.36 kg) BMI:     30.35 Temp:     97.3 degrees F (36.28 degrees C) oral Pulse rate:   84 / minute BP sitting:   123 / 77  (right arm)  Vitals Entered By: Stanton Kidney Ditzler RN (February 24, 2009 4:02 PM) CC: Depression Is Patient Diabetic? Yes  Pain Assessment Patient in pain? yes     Location: back and right jaw Intensity: 1-2 Nutritional Status BMI of > 30 = obese Nutritional Status Detail appetite down CBG Result 258  Have you ever been in a relationship where you felt threatened, hurt or afraid?denies   Does patient need assistance? Functional Status Self care Ambulation Normal Comments Since 02/23/09 notes 6 bumps on upper arm - burning feeling. Feels tired. About 3-4 weeks ago cortisone shot right shoulder. In drug study for diabetes 01/30/09 - right jaw area hurts.   Primary Care Provider:  Tacey Ruiz MD  CC:  Depression.  History of Present Illness: 55 yo man with pmhx as described below comes to the clinic complaining of blister. Patient states he woke up around 12:30pm Thursday feeling like his inner bicep area was burning and saw that he had scratched himself on that area while he was sleeping. Burning has persisted. Patient used caramax without relief. On the next day at work he developed blisters on same area.  Denies any fever, chills, has not done any yard work recently.    Patient states to have sinus pressure. No nasal secretions. No coughing, SOB. Patient feels that his there is pressure on his eyes. Patient feels that is submandibular glands are somewhat tender but are not enlarged. Patient has not used nasonex and allegra.  Depression History:      The patient denies a depressed mood most of the day and a diminished interest in his usual daily activities.         Preventive Screening-Counseling &  Management  Alcohol-Tobacco     Smoking Status: quit     Year Quit: only at age 37     Passive Smoke Exposure: no  Caffeine-Diet-Exercise     Does Patient Exercise: yes     Type of exercise: walking     Times/week: 2  Problems Prior to Update: 1)  Rotator Cuff Syndrome, Right  (ICD-726.10) 2)  Shoulder Pain, Right  (ICD-719.41) 3)  Risk of Sleep Apnea  (ICD-V12.59) 4)  Unspecified Disorder of Liver  (ICD-573.9) 5)  Memory Loss, Short Term  (ICD-780.93) 6)  Adverse Side Effect, Diuretic  (ICD-E944.4) 7)  Edema, Eyelid  (ICD-374.82) 8)  Asthma, Intrinsic, With Acute Exacerbation  (ICD-493.12) 9)  Dyspepsia  (ICD-536.8) 10)  Sinusitis  (ICD-473.9) 11)  Percutaneous Transluminal Coronary Angioplasty, Hx of  (ICD-V45.82) 12)  Erectile Dysfunction, Organic  (ICD-607.84) 13)  Hyperlipidemia, Mixed  (ICD-272.2) 14)  Coronary Artery Disease  (ICD-414.00) 15)  Allergic Rhinitis  (ICD-477.9) 16)  Head Trauma  (ICD-959.01) 17)  Cerumen Removal, Hx of  (ICD-V15.89) 18)  Myocardial Infarction, Inferior Wall  (ICD-410.40) 19)  Hypertension  (ICD-401.9) 20)  Diabetes Mellitus, Type II  (ICD-250.00)  Medications Prior to Update: 1)  Plavix 75 Mg Tabs (Clopidogrel Bisulfate) .... Take 1 Tablet By Mouth Once A Day 2)  Niaspan 1000 Mg Cr-Tabs (Niacin (Antihyperlipidemic)) .... Take 2 Tablet By Mouth  Once A Day 3)  Accupril 40 Mg Tabs (Quinapril Hcl) .... Take 1 Tablet By Mouth Once A Day For Blood Pressure 4)  Viagra 100 Mg Tabs (Sildenafil Citrate) .... As Needed 5)  Flax Seed Oil 1000 Mg Caps (Flaxseed (Linseed)) .... Take 1 Capsule By Mouth Two Times A Day 6)  Famotidine 40 Mg Tabs (Famotidine) .... Take 1 Tablet By Mouth Once A Day For Upset Stomach 7)  Aspir-Low 81 Mg Tbec (Aspirin) .... Take 1 Tablet By Mouth Once A Day For Circulation 8)  Coreg 6.25 Mg Tabs (Carvedilol) .... Take 1 Tablet By Mouth Two Times A Day 9)  Albuterol 90 Mcg/act  Aers (Albuterol) .... Take 2 Puffs As Needed For  Respiratory Difficulty Up To Every 3 Hourly. 10)  Hydrochlorothiazide 25 Mg Tabs (Hydrochlorothiazide) .... Take 1 Tablet By Mouth Once A Day 11)  Sudafed Pe Sinus/allergy 4-10 Mg  Tabs (Chlorpheniramine-Phenylephrine) .... Take 1 Tablet By Mouth Two Times A Day 12)  Nasonex 50 Mcg/act  Susp (Mometasone Furoate) .... Apply On Spray On Each Nostril Daily. 13)  Novolin R 100 Unit/ml Soln (Insulin Regular Human) .... Inject 30-35 Units Subcutaneously With Each Meal 14)  Insulin Syringe 30g X 5/16" 1 Ml Misc (Insulin Syringe-Needle U-100) .... Use To Inject Insulin 15)  Truetrack Test  Strp (Glucose Blood) .... Use To Test Blood Glucsoe 3x Daily 16)  Lancets Thin  Misc (Lancets) .... Use To Test Blood Glucose 3x Daily 17)  Singulair 10 Mg Tabs (Montelukast Sodium) .... Take 1 Tablet By Mouth Once A Day 18)  Lantus 100 Unit/ml Soln (Insulin Glargine) .... Take 50 Units Daily. 19)  Allegra 180 Mg Tabs (Fexofenadine Hcl) .... Take 1 Pill By Mouth Daily As Needed For Allergy 20)  Debrox 6.5 % Soln (Carbamide Peroxide) .... Put in Each Ear Twice Daily. 21)  Crestor 5 Mg Tabs (Rosuvastatin Calcium) .... Take 1 Pill By Mouth Daily. 22)  Vicodin 5-500 Mg Tabs (Hydrocodone-Acetaminophen) .... Take 1-2 Pills By Mouth As Needed For Shoulder Pain Up To Every 6 Hourly.  Current Medications (verified): 1)  Plavix 75 Mg Tabs (Clopidogrel Bisulfate) .... Take 1 Tablet By Mouth Once A Day 2)  Niaspan 1000 Mg Cr-Tabs (Niacin (Antihyperlipidemic)) .... Take 2 Tablet By Mouth Once A Day 3)  Accupril 40 Mg Tabs (Quinapril Hcl) .... Take 1 Tablet By Mouth Once A Day For Blood Pressure 4)  Viagra 100 Mg Tabs (Sildenafil Citrate) .... As Needed 5)  Flax Seed Oil 1000 Mg Caps (Flaxseed (Linseed)) .... Take 1 Capsule By Mouth Two Times A Day 6)  Aspir-Low 81 Mg Tbec (Aspirin) .... Take 1 Tablet By Mouth Once A Day For Circulation 7)  Coreg 6.25 Mg Tabs (Carvedilol) .... Take 1 Tablet By Mouth Two Times A Day 8)  Albuterol  90 Mcg/act  Aers (Albuterol) .... Take 2 Puffs As Needed For Respiratory Difficulty Up To Every 3 Hourly. 9)  Hydrochlorothiazide 25 Mg Tabs (Hydrochlorothiazide) .... Take 1 Tablet By Mouth Once A Day 10)  Sudafed Pe Sinus/allergy 4-10 Mg  Tabs (Chlorpheniramine-Phenylephrine) .... Take 1 Tablet By Mouth Two Times A Day 11)  Nasonex 50 Mcg/act  Susp (Mometasone Furoate) .... Apply On Spray On Each Nostril Daily. 12)  Novolin R 100 Unit/ml Soln (Insulin Regular Human) .... Inject 30-35 Units Subcutaneously With Each Meal 13)  Insulin Syringe 30g X 5/16" 1 Ml Misc (Insulin Syringe-Needle U-100) .... Use To Inject Insulin 14)  Truetrack Test  Strp (Glucose Blood) .... Use To  Test Blood Glucsoe 3x Daily 15)  Lancets Thin  Misc (Lancets) .... Use To Test Blood Glucose 3x Daily 16)  Singulair 10 Mg Tabs (Montelukast Sodium) .... Take 1 Tablet By Mouth Once A Day 17)  Lantus 100 Unit/ml Soln (Insulin Glargine) .... Take 50 Units Daily. 18)  Allegra 180 Mg Tabs (Fexofenadine Hcl) .... Take 1 Pill By Mouth Daily As Needed For Allergy 19)  Debrox 6.5 % Soln (Carbamide Peroxide) .... Put in Each Ear Twice Daily. 20)  Crestor 5 Mg Tabs (Rosuvastatin Calcium) .... Take 1 Pill By Mouth Daily. 21)  Vicodin 5-500 Mg Tabs (Hydrocodone-Acetaminophen) .... Take 1-2 Pills By Mouth As Needed For Shoulder Pain Up To Every 6 Hourly.  Allergies: No Known Drug Allergies  Past History:  Past Medical History: Last updated: 03/17/2008 Diabetes mellitus, type II Hypertension Allergic rhinitis ED Bronchitis Hyperlipidemia hx/o MI, inferior wall s/p stent in 2006  Past Surgical History: Last updated: 12/19/2006 PTCA/stent to RCA  Social History: Last updated: 03/17/2008 Single Works as a Chief of Staff guard  Uses marijuana  Risk Factors: Exercise: yes (02/24/2009)  Risk Factors: Smoking Status: quit (02/24/2009) Passive Smoke Exposure: no (02/24/2009)  Review of Systems  The patient denies  fever, chest pain, dyspnea on exertion, peripheral edema, prolonged cough, headaches, hemoptysis, abdominal pain, melena, hematochezia, hematuria, muscle weakness, difficulty walking, and abnormal bleeding.    Physical Exam  General:  NAD Head:  normocephalic.   Eyes:  vision grossly intact and conjunctival injection.   Ears:  R cerumen impaction.  L cerumen without impaction Nose:  no external deformity.   Mouth:  no erythema and no exudates.   Lungs:  Normal respiratory effort, chest expands symmetrically. Lungs are clear to auscultation, no crackles or wheezes. Heart:  Normal rate and regular rhythm. S1 and S2 normal without gallop, murmur, click, rub or other extra sounds. Abdomen:  soft, non-tender, and normal bowel sounds.   Msk:  normal ROM.   Skin:  Various escoriations and 2 small blisters on inner right bicep area, no erythema, no drainage, no rashes.   Cervical Nodes:  no anterior cervical adenopathy.     Impression & Recommendations:  Problem # 1:  BLISTER (ICD-919.2) Patient was told to place thin layer of neosporin on area daily, wash area with soap and water daily, and cover area with dry dressings daily. Area is not erythematous and there is no drainage. There is no necrosis of skin or evidence of spider bite.  No indication for systemic antibiotics. Will have patient return in one week for reevaluation.   Problem # 2:  CERUMEN IMPACTION (ICD-380.4) Will have nurses disimpact and clean out ears bilaterally with warm water flushes.  Problem # 3:  ALLERGIC RHINITIS (ICD-477.9) Will have patient start taking prescribed medication as indicated.  His updated medication list for this problem includes:    Nasonex 50 Mcg/act Susp (Mometasone furoate) .Marland Kitchen... Apply on spray on each nostril daily.    Allegra 180 Mg Tabs (Fexofenadine hcl) .Marland Kitchen... Take 1 pill by mouth daily as needed for allergy  Problem # 4:  DIABETES MELLITUS, TYPE II (ICD-250.00) Patient did not bring meter with  him today, but A1c is improving. Will continue current regimen. Patient was instructed to check blood sugars atleast twice a day and bring meter with him during next visit for further management.   His updated medication list for this problem includes:    Accupril 40 Mg Tabs (Quinapril hcl) .Marland Kitchen... Take 1 tablet by mouth once a  day for blood pressure    Aspir-low 81 Mg Tbec (Aspirin) .Marland Kitchen... Take 1 tablet by mouth once a day for circulation    Novolin R 100 Unit/ml Soln (Insulin regular human) ..... Inject 30-35 units subcutaneously with each meal    Lantus 100 Unit/ml Soln (Insulin glargine) .Marland Kitchen... Take 50 units daily.  Orders: T- Capillary Blood Glucose (82948) T-Hgb A1C (in-house) (16109UE)  Labs Reviewed: Creat: 0.90 (12/28/2008)     Last Eye Exam: No diabetic retinopathy.   Visual acuity OD (best corrected):     20/25 Visual acuity OS (best corrected):     20/30 Intraocular pressure OD:     22 Intraocular pressure OS:     22  (11/02/2008) Reviewed HgBA1c results: 7.9 (02/24/2009)  9.5 (11/14/2008)  Problem # 5:  HYPERTENSION (ICD-401.9) Good control. Continue current regimen.  His updated medication list for this problem includes:    Accupril 40 Mg Tabs (Quinapril hcl) .Marland Kitchen... Take 1 tablet by mouth once a day for blood pressure    Coreg 6.25 Mg Tabs (Carvedilol) .Marland Kitchen... Take 1 tablet by mouth two times a day    Hydrochlorothiazide 25 Mg Tabs (Hydrochlorothiazide) .Marland Kitchen... Take 1 tablet by mouth once a day  BP today: 123/77 Prior BP: 140/70 (01/27/2009)  Labs Reviewed: K+: 4.1 (12/28/2008) Creat: : 0.90 (12/28/2008)   Chol: 140 (11/15/2008)   HDL: 42 (11/15/2008)   LDL: 83 (11/15/2008)   TG: 77 (11/15/2008)  Complete Medication List: 1)  Plavix 75 Mg Tabs (Clopidogrel bisulfate) .... Take 1 tablet by mouth once a day 2)  Niaspan 1000 Mg Cr-tabs (Niacin (antihyperlipidemic)) .... Take 2 tablet by mouth once a day 3)  Accupril 40 Mg Tabs (Quinapril hcl) .... Take 1 tablet by mouth  once a day for blood pressure 4)  Viagra 100 Mg Tabs (Sildenafil citrate) .... As needed 5)  Flax Seed Oil 1000 Mg Caps (Flaxseed (linseed)) .... Take 1 capsule by mouth two times a day 6)  Aspir-low 81 Mg Tbec (Aspirin) .... Take 1 tablet by mouth once a day for circulation 7)  Coreg 6.25 Mg Tabs (Carvedilol) .... Take 1 tablet by mouth two times a day 8)  Albuterol 90 Mcg/act Aers (Albuterol) .... Take 2 puffs as needed for respiratory difficulty up to every 3 hourly. 9)  Hydrochlorothiazide 25 Mg Tabs (Hydrochlorothiazide) .... Take 1 tablet by mouth once a day 10)  Sudafed Pe Sinus/allergy 4-10 Mg Tabs (Chlorpheniramine-phenylephrine) .... Take 1 tablet by mouth two times a day 11)  Nasonex 50 Mcg/act Susp (Mometasone furoate) .... Apply on spray on each nostril daily. 12)  Novolin R 100 Unit/ml Soln (Insulin regular human) .... Inject 30-35 units subcutaneously with each meal 13)  Insulin Syringe 30g X 5/16" 1 Ml Misc (Insulin syringe-needle u-100) .... Use to inject insulin 14)  Truetrack Test Strp (Glucose blood) .... Use to test blood glucsoe 3x daily 15)  Lancets Thin Misc (Lancets) .... Use to test blood glucose 3x daily 16)  Singulair 10 Mg Tabs (Montelukast sodium) .... Take 1 tablet by mouth once a day 17)  Lantus 100 Unit/ml Soln (Insulin glargine) .... Take 50 units daily. 18)  Allegra 180 Mg Tabs (Fexofenadine hcl) .... Take 1 pill by mouth daily as needed for allergy 19)  Debrox 6.5 % Soln (Carbamide peroxide) .... Put in each ear twice daily. 20)  Crestor 5 Mg Tabs (Rosuvastatin calcium) .... Take 1 pill by mouth daily. 21)  Vicodin 5-500 Mg Tabs (Hydrocodone-acetaminophen) .... Take 1-2 pills by mouth  as needed for shoulder pain up to every 6 hourly.  Patient Instructions: 1)  Please schedule a follow-up appointment in 1 week. 2)  Place Neosporin over blister. Place dry dressing over blister. Clean area daily with soap.  3)  Take tylenol for burning sensation.  Laboratory  Results   Blood Tests   Date/Time Received: February 24, 2009 3:59 PM. Date/Time Reported: Alric Quan  February 24, 2009 3:59 PM  HGBA1C: 7.9%   (Normal Range: Non-Diabetic - 3-6%   Control Diabetic - 6-8%) CBG Random:: 258mg /dL     Prevention & Chronic Care Immunizations   Influenza vaccine: Fluvax Non-MCR  (05/04/2008)    Tetanus booster: Not documented    Pneumococcal vaccine: Not documented  Colorectal Screening   Hemoccult: Not documented    Colonoscopy: Not documented  Other Screening   PSA: Not documented    Smoking status: quit  (02/24/2009)  Diabetes Mellitus   HgbA1C: 7.9  (02/24/2009)   Hemoglobin A1C due: 09/16/2007    Eye exam: No diabetic retinopathy.   Visual acuity OD (best corrected):     20/25 Visual acuity OS (best corrected):     20/30 Intraocular pressure OD:     22 Intraocular pressure OS:     22   (11/02/2008)   Eye exam due: 11/2009    Foot exam: yes  (09/28/2008)   High risk foot: No  (09/28/2008)   Foot care education: Done  (09/28/2008)   Foot exam due: 12/18/2007    Urine microalbumin/creatinine ratio: 3.2  (04/08/2008)   Urine microalbumin/cr due: 12/21/2007    Diabetes flowsheet reviewed?: Yes   Progress toward A1C goal: Improved  Hypertension   Last Blood Pressure: 123 / 77  (02/24/2009)   Serum creatinine: 0.90  (12/28/2008)   Serum potassium 4.1  (12/28/2008)    Hypertension flowsheet reviewed?: Yes   Progress toward BP goal: Improved  Lipids   Total Cholesterol: 140  (11/15/2008)   LDL: 83  (11/15/2008)   LDL Direct: Not documented   HDL: 42  (11/15/2008)   Triglycerides: 77  (11/15/2008)   Lipid panel due: 12/19/2007    SGOT (AST): 36  (11/15/2008)   SGPT (ALT): 45  (11/15/2008)   Alkaline phosphatase: 100  (11/15/2008)   Total bilirubin: 0.5  (11/15/2008)    Lipid flowsheet reviewed?: Yes   Progress toward LDL goal: Improved  Self-Management Support :    Patient will work on the following items until the  next clinic visit to reach self-care goals:     Medications and monitoring: take my medicines every day, check my blood sugar, bring all of my medications to every visit, weigh myself weekly, examine my feet every day  (02/24/2009)     Eating: drink diet soda or water instead of juice or soda, eat more vegetables, use fresh or frozen vegetables, eat fruit for snacks and desserts  (02/24/2009)     Activity: take a 30 minute walk every day, take the stairs instead of the elevator, park at the far end of the parking lot  (02/24/2009)    Diabetes self-management support: Written self-care plan  (02/24/2009)   Diabetes care plan printed   Last diabetes self-management training by diabetes educator: 11/15/2008    Hypertension self-management support: Written self-care plan  (02/24/2009)   Hypertension self-care plan printed.    Lipid self-management support: Written self-care plan  (02/24/2009)   Lipid self-care plan printed.

## 2010-09-18 NOTE — Assessment & Plan Note (Signed)
Summary: REASSIGNED NEW TO DR/CFB-APPT AT 4 PM   Vital Signs:  Patient Profile:   55 Years Old Male Height:     73 inches (185.42 cm) Weight:      223.7 pounds (101.68 kg) BMI:     29.62 Temp:     97.6 degrees F (36.44 degrees C) oral Pulse rate:   75 / minute BP sitting:   147 / 82  (right arm)  Pt. in pain?   no  Vitals Entered By: Krystal Eaton Duncan Dull) (March 24, 2007 3:56 PM)              Is Patient Diabetic? Yes  CBG Result 294  Have you ever been in a relationship where you felt threatened, hurt or afraid?No   Does patient need assistance? Functional Status Self care Ambulation Normal   PCP:  Tacey Ruiz MD  Chief Complaint:  routine check of chronic health issues and med refill.  History of Present Illness: [name] only complaint is bloating after eating.  Pt had a complete abdominal w/u which was all normal.  Seen by cardiologist who thought he was doing well.  Meets regularly with Jamison Neighbor concerning diabetes control and diet.  Follow-Up Visit      This is a 55 year old man who presents for Follow-up visit.  Pt here for hospital f/u after CP.  Complete w/u done and was negative.  Pt here for med refills as well.  The patient complains of low blood sugar symptoms and high blood sugar symptoms, but denies chest pain, palpitations, dizziness, syncope, edema, SOB, DOE, PND, and orthopnea.  Since the last visit the patient notes being seen by a specialist.  The patient reports taking meds as prescribed, not monitoring BP, monitoring blood sugars, dietary noncompliance, and exercising occasionaly.  When questioned about possible medication side effects, the patient notes none.    Current Allergies (reviewed today): No known allergies  Updated/Current Medications (including changes made in today's visit):  LANTUS 100 UNIT/ML SOLN (INSULIN GLARGINE) Take 60 units at bedtime for diabetes. PLAVIX 75 MG TABS (CLOPIDOGREL BISULFATE) Take 1 tablet by mouth once a  day NIASPAN 500 MG TBCR (NIACIN (ANTIHYPERLIPIDEMIC)) Take 2 tablets by mouth once a day ACCUPRIL 40 MG TABS (QUINAPRIL HCL) Take 1 tablet by mouth once a day for blood pressure GLUCOPHAGE 1000 MG TABS (METFORMIN HCL) Take 1 tablet by mouth two times a day VIAGRA 100 MG TABS (SILDENAFIL CITRATE) as needed FLONASE 50 MCG/ACT SUSP (FLUTICASONE PROPIONATE) Give one puff to each nostril every day FLAX SEED OIL 1000 MG CAPS (FLAXSEED (LINSEED)) Take 1 capsule by mouth two times a day ALLEGRA 180 MG TABS (FEXOFENADINE HCL) Take 1 tablet by mouth once a day for allergies FAMOTIDINE 40 MG TABS (FAMOTIDINE) Take 1 tablet by mouth once a day for upset stomach ASPIR-LOW 81 MG TBEC (ASPIRIN) Take 1 tablet by mouth once a day for circulation NOVOLOG FLEXPEN   SOLN (INSULIN ASPART SOLN) Take 10 units with each meal TOPROL XL 100 MG  TB24 (METOPROLOL SUCCINATE) Take 1 tablet by mouth once a day     Risk Factors: Tobacco use:  quit    Year quit:  only at age 34   Review of Systems  The patient denies anorexia, fever, weight loss, vision loss, decreased hearing, hoarseness, chest pain, syncope, dyspnea on exhertion, peripheral edema, prolonged cough, hemoptysis, abdominal pain, melena, hematochezia, severe indigestion/heartburn, and hematuria.     Physical Exam  General:  Well-developed,well-nourished,in no acute distress; alert,appropriate and cooperative throughout examination Head:     Normocephalic and atraumatic without obvious abnormalities. No apparent alopecia or balding. Eyes:     No corneal or conjunctival inflammation noted. EOMI. Perrla.  Vision grossly normal. Ears:     External ear exam shows no significant lesions or deformities.  Hearing is grossly normal bilaterally. Nose:     External nasal examination shows no deformity or inflammation. Nasal mucosa are pink and moist without lesions or exudates. Mouth:     Oral mucosa and oropharynx without lesions or exudates.  Teeth in  good repair. Neck:     No deformities, masses, or tenderness noted. Lungs:     Normal respiratory effort, chest expands symmetrically. Lungs are clear to auscultation, no crackles or wheezes. Heart:     Normal rate and regular rhythm. S1 and S2 normal without gallop, murmur, click, rub or other extra sounds. Abdomen:     soft, non-tender, normal bowel sounds, no guarding, distended, and mild umbilical hernia.   Msk:     No deformity or scoliosis noted of thoracic or lumbar spine.   Extremities:     No clubbing, cyanosis, edema, or deformity noted with normal full range of motion of all joints.   Neurologic:     alert & oriented X3, cranial nerves II-XII intact, strength normal in all extremities, and gait normal.   Skin:     Intact without suspicious lesions or rashes Cervical Nodes:     No lymphadenopathy noted Psych:     Oriented X3, memory intact for recent and remote, normally interactive, and slightly anxious and very talkative.      Impression & Recommendations:  Problem # 1:  DIABETES MELLITUS, TYPE II (ICD-250.00) Hgb A1c today 7.9.  Pt states he is compliant with medications but does not do well with his diet.  Patient admits to not taking Metformin like he should. We will keep meds the same.  Pt sees Jamison Neighbor.  Will recheck CMET, FLP and HgbA1c in 3 months.  His updated medication list for this problem includes:    Lantus 100 Unit/ml Soln (Insulin glargine) .Marland Kitchen... Take 60 units at bedtime for diabetes.    Accupril 40 Mg Tabs (Quinapril hcl) .Marland Kitchen... Take 1 tablet by mouth once a day for blood pressure    Glucophage 1000 Mg Tabs (Metformin hcl) .Marland Kitchen... Take 1 tablet by mouth two times a day    Aspir-low 81 Mg Tbec (Aspirin) .Marland Kitchen... Take 1 tablet by mouth once a day for circulation    Novolog Flexpen Soln (Insulin aspart soln) .Marland Kitchen... Take 10 units with each meal  Orders: T-Hgb A1C (in-house) ([address]UE) T- Capillary Blood Glucose (45[address])  Future Orders: T-Comprehensive  Metabolic Panel ([id number]) ... 03/25/2007  Labs Reviewed: HgBA1c: 8.3 (12/19/2006)   Creat: 0.94 (12/19/2006)      Problem # 2:  HYPERTENSION (ICD-401.9) Pt had accupril increased to 40mg  today.  Metoprolol was also increased to 100mg  by cardiologist.  BP elevated mildly today but should tolerate increased dose of both meds since toprol not strong BP med.  Pt told to call clinic if he begins suffering from hypotensive symptoms and we will decrease accupril again.  The following medications were removed from the medication list:    Toprol Xl 50 Mg Tb24 (Metoprolol succinate) .Marland Kitchen... Take 1 tablet by mouth once a day for blood pressure  His updated medication list for this problem includes:    Accupril 40 Mg Tabs (Quinapril hcl) .Marland Kitchen... Take  1 tablet by mouth once a day for blood pressure    Toprol Xl 100 Mg Tb24 (Metoprolol succinate) .Marland Kitchen... Take 1 tablet by mouth once a day  BP today: 147/82 Prior BP: 151/79 (01/05/2007)  Labs Reviewed: Creat: 0.94 (12/19/2006) Chol: 158 (12/19/2006)   HDL: 43 (12/19/2006)   LDL: 92 (12/19/2006)   TG: 113 (12/19/2006)   Complete Medication List: 1)  Lantus 100 Unit/ml Soln (Insulin glargine) .... Take 60 units at bedtime for diabetes. 2)  Plavix 75 Mg Tabs (Clopidogrel bisulfate) .... Take 1 tablet by mouth once a day 3)  Niaspan 500 Mg Tbcr (Niacin (antihyperlipidemic)) .... Take 2 tablets by mouth once a day 4)  Accupril 40 Mg Tabs (Quinapril hcl) .... Take 1 tablet by mouth once a day for blood pressure 5)  Glucophage 1000 Mg Tabs (Metformin hcl) .... Take 1 tablet by mouth two times a day 6)  Viagra 100 Mg Tabs (Sildenafil citrate) .... As needed 7)  Flonase 50 Mcg/act Susp (Fluticasone propionate) .... Give one puff to each nostril every day 8)  Flax Seed Oil 1000 Mg Caps (Flaxseed (linseed)) .... Take 1 capsule by mouth two times a day 9)  Allegra 180 Mg Tabs (Fexofenadine hcl) .... Take 1 tablet by mouth once a day for allergies 10)  Famotidine  40 Mg Tabs (Famotidine) .... Take 1 tablet by mouth once a day for upset stomach 11)  Aspir-low 81 Mg Tbec (Aspirin) .... Take 1 tablet by mouth once a day for circulation 12)  Novolog Flexpen Soln (Insulin aspart soln) .... Take 10 units with each meal 13)  Toprol Xl 100 Mg Tb24 (Metoprolol succinate) .... Take 1 tablet by mouth once a day   Patient Instructions: 1)  Please schedule a follow-up appointment in 3 months. 2)  Please return for a FASTING Lipid Profile and HgBA1c in 3 months. 3)  It is important that you exercise regularly at least 20 minutes 5 times a week. If you develop chest pain, have severe difficulty breathing, or feel very tired , stop exercising immediately and seek medical attention. 4)  You need to lose weight. Consider a lower calorie diet and regular exercise.  5)  Take an Aspirin every day. 6)  Check your blood sugars regularly. If your readings are usually above : or below 70 you should contact our office. 7)  It is important that your Diabetic A1c level is checked every 3 months. 8)  See your eye doctor yearly to check for diabetic eye damage. 9)  Check your feet each night for sore areas, calluses or signs of infection.    Prescriptions: NOVOLOG FLEXPEN   SOLN (INSULIN ASPART SOLN) Take 10 units with each meal  #1 month x 4   Entered and Authorized by:   Tacey Ruiz MD   Signed by:   Tacey Ruiz MD on 03/24/2007   Method used:   Print then Give to Patient   RxID:   [id number]        Last LDL:                                                 92 (12/19/2006 9:54:00 PM)      Laboratory Results   Blood Tests   Date/Time Recieved: March 24, 2007 4:54 PM  Date/Time Reported: ..................................................................Marland KitchenOren Beckmann  March 24, 2007  4:54 PM   HGBA1C: 7.9%   (Normal Range: Non-Diabetic - 3-6%   Control Diabetic - 6-8%) CBG Random: 294      Appended Document: REASSIGNED NEW TO DR/CFB-APPT AT 4  PM Patient was counselled on the symptoms of hypoglycemia, asked to check his CBG's with any such symptoms, and to call with values less than 80.

## 2010-09-18 NOTE — Progress Notes (Signed)
Summary: med refill/gp  Phone Note Refill Request Message from:  Fax from Pharmacy on July 11, 2010 9:40 AM  Refills Requested: Medication #1:  CRESTOR 5 MG TABS take 1 pill by mouth daily.   Last Refilled: 05/03/2010 Last appt. 06/08/10.   Method Requested: Fax to Local Pharmacy Initial call taken by: Chinita Pester RN,  July 11, 2010 9:40 AM  Follow-up for Phone Call        Rx refill request form faxed to [facility]. Follow-up by: Chinita Pester RN,  July 11, 2010 11:37 AM    Prescriptions: CRESTOR 5 MG TABS (ROSUVASTATIN CALCIUM) take 1 pill by mouth daily.  #30 x 2   Entered and Authorized by:   Ulyess Mort MD   Signed by:   Ulyess Mort MD on 07/11/2010   Method used:   Telephoned to ...       [facility] Department (retail)       [address] [place], [place]  [address]       Ph: [phone]       Fax: [phone]   RxID:   [id number]

## 2010-09-18 NOTE — Progress Notes (Signed)
Summary: med refill/gp  Phone Note Refill Request Message from:  Fax from Pharmacy on May 02, 2010 12:02 PM  Refills Requested: Medication #1:  CRESTOR 5 MG TABS take 1 pill by mouth daily.   Last Refilled: 02/24/2010 Last appt. June 14; next appt. Oct. 21.   Method Requested: Telephone to Pharmacy Initial call taken by: Chinita Pester RN,  May 02, 2010 12:02 PM  Follow-up for Phone Call        Rx faxed to pharmacy. Follow-up by: Margarito Liner MD,  May 02, 2010 2:12 PM    Prescriptions: CRESTOR 5 MG TABS (ROSUVASTATIN CALCIUM) take 1 pill by mouth daily.  #30 x 2   Entered and Authorized by:   Margarito Liner MD   Signed by:   Margarito Liner MD on 05/02/2010   Method used:   Faxed to ...       [facility] Department (retail)       [address] [place], [place]  [address]       Ph: [phone]       Fax: [phone]   RxID:   [id number]

## 2010-09-18 NOTE — Progress Notes (Signed)
Summary: refill/gg  Phone Note Refill Request  on April 06, 2010 4:02 PM  Refills Requested: Medication #1:  NIASPAN 1000 MG CR-TABS Take 2 tablet by mouth once a day   Last Refilled: 01/12/2010  Medication #2:  SINGULAIR 10 MG TABS Take 1 tablet by mouth once a day   Last Refilled: 02/24/2010 # 180  [facility]   Method Requested: Fax to Local Pharmacy Initial call taken by: Merrie Roof RN,  April 06, 2010 4:03 PM  Follow-up for Phone Call        Last seen June and insulin increased but pt didn't keep 2 week F/U. Last [facility] May so due for A1C thus month. Will refill and sent flag to Ms Lissa Hoard to make appt - first available with Dr Tonny Branch. Follow-up by: Blanch Media MD,  April 06, 2010 4:08 PM    Prescriptions: SINGULAIR 10 MG TABS (MONTELUKAST SODIUM) Take 1 tablet by mouth once a day  #30 x 2   Entered and Authorized by:   Blanch Media MD   Signed by:   Blanch Media MD on 04/06/2010   Method used:   Faxed to ...       [facility] Department (retail)       [address] [place], [place]  [address]       Ph: [phone]       Fax: [phone]   RxID:   [id number] NIASPAN 1000 MG CR-TABS (NIACIN (ANTIHYPERLIPIDEMIC)) Take 2 tablet by mouth once a day  #60 x 2   Entered and Authorized by:   Blanch Media MD   Signed by:   Blanch Media MD on 04/06/2010   Method used:   Faxed to ...       [facility] Department (retail)       [address] [place], [place]  [address]       Ph: [phone]       Fax: [phone]   RxID:   [id number]

## 2010-09-18 NOTE — Progress Notes (Signed)
Summary: refill/gg  Phone Note Refill Request  on June 16, 2008 5:26 PM  Refills Requested: Medication #1:  VIAGRA 100 MG TABS as needed   Last Refilled: 03/15/2008  Medication #2:  ACCUPRIL 40 MG TABS Take 1 tablet by mouth once a day for blood pressure   Last Refilled: 04/01/2008 [facility] dept [facility]   Method Requested: Fax to Local Pharmacy Initial call taken by: Merrie Roof RN,  June 16, 2008 5:26 PM  Follow-up for Phone Call       Follow-up by: Jason Coop MD,  June 17, 2008 9:36 AM      Prescriptions: VIAGRA 100 MG TABS (SILDENAFIL CITRATE) as needed  #15 x 1   Entered and Authorized by:   Jason Coop MD   Signed by:   Jason Coop MD on 06/17/2008   Method used:   Telephoned to ...       [facility] Department (retail)       [address] [place], [place]  [address]       Ph: [phone]       Fax: [phone]   RxID:   [id number] ACCUPRIL 40 MG TABS (QUINAPRIL HCL) Take 1 tablet by mouth once a day for blood pressure  #90 x 11   Entered and Authorized by:   Jason Coop MD   Signed by:   Jason Coop MD on 06/17/2008   Method used:   Telephoned to ...       [facility] Department (retail)       [address] [place], [place]  [address]       Ph: [phone]       Fax: [phone]   RxID:   [id number]

## 2010-09-18 NOTE — Miscellaneous (Signed)
[facility]   Imported By: Margie Billet 12/12/2009 12:10:14  _____________________________________________________________________  External Attachment:    Type:   Image     Comment:   External Document

## 2010-09-18 NOTE — Progress Notes (Signed)
Summary: refill/gg  Phone Note Refill Request  on November 07, 2009 1:43 PM  Refills Requested: Medication #1:  ALLEGRA 180 MG TABS take 1 pill by mouth daily as needed for allergy  Medication #2:  CRESTOR 5 MG TABS take 1 pill by mouth daily.   Last Refilled: 08/10/2009 Allegra is no longer available, can you change to Clarinexor Xyzal and give 3 month supply   Method Requested: Fax to Local Pharmacy Initial call taken by: Merrie Roof RN,  November 07, 2009 1:43 PM  Follow-up for Phone Call       Follow-up by: Jason Coop MD,  November 07, 2009 2:54 PM    New/Updated Medications: XYZAL 5 MG TABS (LEVOCETIRIZINE DIHYDROCHLORIDE) take 1 pill by mouth daily as needed for allergy Prescriptions: XYZAL 5 MG TABS (LEVOCETIRIZINE DIHYDROCHLORIDE) take 1 pill by mouth daily as needed for allergy  #30 x 3   Entered and Authorized by:   Jason Coop MD   Signed by:   Jason Coop MD on 11/07/2009   Method used:   Faxed to ...       [facility] Department (retail)       [address] [place], [place]  [address]       Ph: [phone]       Fax: [phone]   RxID:   [id number] CRESTOR 5 MG TABS (ROSUVASTATIN CALCIUM) take 1 pill by mouth daily.  #30 x 3   Entered and Authorized by:   Jason Coop MD   Signed by:   Jason Coop MD on 11/07/2009   Method used:   Faxed to ...       [facility] Department (retail)       [address] [place], [place]  [address]       Ph: [phone]       Fax: [phone]   RxID:   [id number]   Appended Document: refill/gg rxfaxed in

## 2010-09-18 NOTE — Progress Notes (Signed)
Summary: refill/ hla  Phone Note Refill Request Message from:  Fax from Pharmacy on January 17, 2010 11:58 AM  Refills Requested: Medication #1:  SINGULAIR 10 MG TABS Take 1 tablet by mouth once a day   Last Refilled: 3/16  Medication #2:  NASONEX 50 MCG/ACT  SUSP Apply on spray on each nostril daily.   Last Refilled: 3/10 Initial call taken by: Marin Roberts RN,  January 17, 2010 12:00 PM  Follow-up for Phone Call       Follow-up by: Jason Coop MD,  January 17, 2010 6:40 PM    Prescriptions: SINGULAIR 10 MG TABS (MONTELUKAST SODIUM) Take 1 tablet by mouth once a day  #30 x 0   Entered and Authorized by:   Jason Coop MD   Signed by:   Jason Coop MD on 01/17/2010   Method used:   Telephoned to ...       [facility] Department (retail)       [address] [place], [place]  [address]       Ph: [phone]       Fax: [phone]   RxID:   [id number] NASONEX 50 MCG/ACT  SUSP (MOMETASONE FUROATE) Apply on spray on each nostril daily.  #1 month x 0   Entered and Authorized by:   Jason Coop MD   Signed by:   Jason Coop MD on 01/17/2010   Method used:   Telephoned to ...       [facility] Department (retail)       [address] [place], [place]  [address]       Ph: [phone]       Fax: [phone]   RxID:   [id number]

## 2010-09-18 NOTE — Progress Notes (Signed)
Summary: Refill/gh  Phone Note Refill Request Message from:  Fax from Pharmacy on June 01, 2010 5:26 PM  Refills Requested: Medication #1:  NASONEX 50 MCG/ACT  SUSP Apply on spray on each nostril daily.   Last Refilled: 02/24/2010  Medication #2:  LANTUS 100 UNIT/ML SOLN take 55 units daily.   Last Refilled: 02/16/2010 Lasrt visit was 01/30/2010.   Method Requested: Fax to Local Pharmacy Initial call taken by: Angelina Ok RN,  June 01, 2010 5:26 PM  Follow-up for Phone Call        Has Oct 21 appt Follow-up by: Blanch Media MD,  June 04, 2010 8:35 AM    Prescriptions: LANTUS 100 UNIT/ML SOLN (INSULIN GLARGINE) take 55 units daily.  #2 10mL vials x 5   Entered and Authorized by:   Blanch Media MD   Signed by:   Blanch Media MD on 06/04/2010   Method used:   Faxed to ...       [facility] Department (retail)       [address] [place], [place]  [address]       Ph: [phone]       Fax: [phone]   RxID:   [id number] NASONEX 50 MCG/ACT  SUSP (MOMETASONE FUROATE) Apply on spray on each nostril daily.  #1 bottle x 11   Entered and Authorized by:   Blanch Media MD   Signed by:   Blanch Media MD on 06/04/2010   Method used:   Faxed to ...       [facility] Department (retail)       [address] [place], [place]  [address]       Ph: [phone]       Fax: [phone]   RxID:   [id number]

## 2010-09-18 NOTE — Progress Notes (Signed)
Summary: refill/gg  Phone Note Refill Request  on June 28, 2008 1:37 PM  Refills Requested: Medication #1:  TOPROL XL 100 MG  TB24 Take 1 tablet by mouth once a day   Last Refilled: 03/08/2008  Method Requested: Fax to Local Pharmacy Initial call taken by: Merrie Roof RN,  June 28, 2008 1:39 PM  Follow-up for Phone Call       Follow-up by: Jason Coop MD,  June 29, 2008 12:13 PM      Prescriptions: TOPROL XL 100 MG  TB24 (METOPROLOL SUCCINATE) Take 1 tablet by mouth once a day  #30 x 6   Entered and Authorized by:   Jason Coop MD   Signed by:   Jason Coop MD on 06/29/2008   Method used:   Telephoned to ...       [facility] Department (retail)       [address] [place], [place]  [address]       Ph: [phone]       Fax: [phone]   RxID:   [id number]

## 2010-09-18 NOTE — Assessment & Plan Note (Signed)
Summary: diabetes/dmr   Allergies: No Known Drug Allergies   Complete Medication List: 1)  Plavix 75 Mg Tabs (Clopidogrel bisulfate) .... Take 1 tablet by mouth once a day 2)  Niaspan 1000 Mg Cr-tabs (Niacin (antihyperlipidemic)) .... Take 2 tablet by mouth once a day 3)  Accupril 40 Mg Tabs (Quinapril hcl) .... Take 1 tablet by mouth once a day for blood pressure 4)  Viagra 100 Mg Tabs (Sildenafil citrate) .... As needed 5)  Flax Seed Oil 1000 Mg Caps (Flaxseed (linseed)) .... Take 1 capsule by mouth two times a day 6)  Famotidine 40 Mg Tabs (Famotidine) .... Take 1 tablet by mouth once a day for upset stomach 7)  Aspir-low 81 Mg Tbec (Aspirin) .... Take 1 tablet by mouth once a day for circulation 8)  Coreg 6.25 Mg Tabs (Carvedilol) .... Take 1 tablet by mouth two times a day 9)  Albuterol 90 Mcg/act Aers (Albuterol) .... Take 2 puffs as needed for respiratory difficulty up to every 3 hourly. 10)  Hydrochlorothiazide 25 Mg Tabs (Hydrochlorothiazide) .... Take 1 tablet by mouth once a day 11)  Sudafed Pe Sinus/allergy 4-10 Mg Tabs (Chlorpheniramine-phenylephrine) .... Take 1 tablet by mouth two times a day 12)  Nasonex 50 Mcg/act Susp (Mometasone furoate) .... Apply on spray on each nostril daily. 13)  Novolin R 100 Unit/ml Soln (Insulin regular human) .... Inject 30-35 units subcutaneously with each meal 14)  Insulin Syringe 30g X 5/16" 1 Ml Misc (Insulin syringe-needle u-100) .... Use to inject insulin 15)  Truetrack Test Strp (Glucose blood) .... Use to test blood glucsoe 3x daily 16)  Lancets Thin Misc (Lancets) .... Use to test blood glucose 3x daily 17)  Singulair 10 Mg Tabs (Montelukast sodium) .... Take 1 tablet by mouth once a day 18)  Lantus 100 Unit/ml Soln (Insulin glargine) .... Take 50 units daily. 19)  Allegra 180 Mg Tabs (Fexofenadine hcl) .... Take 1 pill by mouth daily as needed for allergy 20)  Debrox 6.5 % Soln (Carbamide peroxide) .... Put in each ear twice  daily.  Other Orders: DSMT(Medicare) Individual, 30 Minutes (G0108)       Diabetes Self Management Training  PCP:  Jason Coop MD Referring MD: [email or web address] Date diagnosed with diabetes: 08/19/1970 Diabetes Type: Type 2 insulin treated Other persons present: no Current smoking Status: quit  Assessment Work Hours: Full Time Daily activities: watches TV and eats from 12 midnight -  ~ 5am sleeps from 5-1PM Special needs or Barriers: illiterate, but good at math in his head, very tangental  Coping with Diabetes What bothers you  most about dealing with your diabetes? injections  Diabetes Medications:  Comments: still not taking meter to work, does most of his eating at work and when he gets home    Recent Episodes of: Hyperglycemia : Yes Hypoglycemia: Yes Severe Hypoglycemia : No  Other Assessment Had an amputation due to diabetes? No Ever had a foot ulcer or infection?           No Performs daily self-foot exams: Yes      Nutrition assessment ETOH : No What beverages do you drink?  regualr soda sometimes, drinking more crystal lite  Activity Limitations  Inadequate physical activity Would you  say you are physically active: No  Type of physical activity  Work  Barriers  Not motivated  Work schedule  Psychosocial Adjustment Identify how stress affects diabetes & two sources of stress: Needs review/assistance Name two ways of obtaining support from family/friends:  Demonstrates competency Diabetes Management Education Done: 11/15/2008    BEHAVIORAL GOAL FOLLOW UP Incorporating appropriate nutritional management: Most of the time Monitoring blood glucose levels daily: Sometimes Specific goal set today: changed nutrition goal to drinking more water 32 ounces/day.       patient still not wiling to take insulin/meter to work. Willing to retry pills-metformin. Thinking about incorporating more exercise.  follow-up: 2 -3 months   Last LDL:                                                  91 (04/08/2008 8:20:00 PM)

## 2010-09-18 NOTE — Miscellaneous (Signed)
Summary: Orders Update  Clinical Lists Changes  Orders: Added new Referral order of Ophthalmology Referral (Ophthalmology) - Signed 

## 2010-09-18 NOTE — Progress Notes (Signed)
Summary: med refill/gp  Phone Note Refill Request Message from:  Fax from Pharmacy on February 12, 2010 12:15 PM  Refills Requested: Medication #1:  XYZAL 5 MG TABS take 1 pill by mouth daily as needed for allergy   Last Refilled: 12/03/2009 Last appt. June 14,2011.   Method Requested: Fax to Local Pharmacy Initial call taken by: Chinita Pester RN,  February 12, 2010 12:15 PM    Prescriptions: XYZAL 5 MG TABS (LEVOCETIRIZINE DIHYDROCHLORIDE) take 1 pill by mouth daily as needed for allergy  #30 x 3   Entered and Authorized by:   Zoila Shutter MD   Signed by:   Zoila Shutter MD on 02/12/2010   Method used:   Faxed to ...       [facility] (retail)       [address] [place], [place]  [address]       Ph: [phone]       Fax: [phone]   RxID:   [id number]

## 2010-09-18 NOTE — Consult Note (Signed)
Summary: Dr. Okey Dupre  Dr. Okey Dupre   Imported By: Florinda Marker 12/01/2008 14:41:53  _____________________________________________________________________  External Attachment:    Type:   Image     Comment:   External Document

## 2010-09-18 NOTE — Assessment & Plan Note (Signed)
Summary: est-ck/fu/meds/cfb   Vital Signs:  Patient Profile:   55 Years Old Male Height:     73 inches (185.42 cm) Weight:      222.5 pounds (101.14 kg) BMI:     29.46 Temp:     96.8 degrees F (36 degrees C) oral Pulse rate:   78 / minute BP sitting:   109 / 65  (right arm)  Pt. in pain?   yes    Location:   lt shoulder    Intensity:   4    Type:       sharp  Vitals Entered By: Chinita Pester RN (September 14, 2008 2:33 PM)              Is Patient Diabetic? Yes Did you bring your meter with you today? Yes Nutritional Status BMI of 25 - 29 = overweight CBG Result 198  Have you ever been in a relationship where you felt threatened, hurt or afraid?No   Does patient need assistance? Functional Status Self care Ambulation Normal     PCP:  Tacey Ruiz MD  Chief Complaint:  check-up.  History of Present Illness: Tyler Wilkinson is a 55 yo man with PMH as outlined in the EMR. He comes today for following reasons.   1) CAD: No CP or SOB. He is taking all his meds including beta blocker, asa, plavix and ace. He hasn't seen his heart MD for a long time.   2) DM: He's taking lantus 60 units at 2.00 PM and 20 units of Novolin R 4-5 AM and 2-3 PM. He's on lantus for 2 years. He checks his blood sugar 2 times daily.   3) Asthma: It's not worse than his usual.   4) Short term memory loss: this started since he had heart attack. No problem with long term memory.     Prior Medications Reviewed Using: Patient Recall  Updated Prior Medication List: PLAVIX 75 MG TABS (CLOPIDOGREL BISULFATE) Take 1 tablet by mouth once a day NIASPAN 1000 MG CR-TABS (NIACIN (ANTIHYPERLIPIDEMIC)) Take 2 tablet by mouth once a day ACCUPRIL 40 MG TABS (QUINAPRIL HCL) Take 1 tablet by mouth once a day for blood pressure VIAGRA 100 MG TABS (SILDENAFIL CITRATE) as needed FLAX SEED OIL 1000 MG CAPS (FLAXSEED (LINSEED)) Take 1 capsule by mouth two times a day FAMOTIDINE 40 MG TABS (FAMOTIDINE) Take 1 tablet  by mouth once a day for upset stomach ASPIR-LOW 81 MG TBEC (ASPIRIN) Take 1 tablet by mouth once a day for circulation COREG 6.25 MG TABS (CARVEDILOL) Take 1 tablet by mouth two times a day ALBUTEROL 90 MCG/ACT  AERS (ALBUTEROL) take 2 puffs as needed for respiratory difficulty up to every 3 hourly. HYDROCHLOROTHIAZIDE 25 MG TABS (HYDROCHLOROTHIAZIDE) Take 1 tablet by mouth once a day SUDAFED PE SINUS/ALLERGY 4-10 MG  TABS (CHLORPHENIRAMINE-PHENYLEPHRINE) Take 1 tablet by mouth two times a day NASONEX 50 MCG/ACT  SUSP (MOMETASONE FUROATE) Apply on spray on each nostril daily. NOVOLIN R 100 UNIT/ML SOLN (INSULIN REGULAR HUMAN) Inject 30-35 units subcutaneously with each meal INSULIN SYRINGE 30G X 5/16" 1 ML MISC (INSULIN SYRINGE-NEEDLE U-100) use to inject insulin TRUETRACK TEST  STRP (GLUCOSE BLOOD) Use to test blood glucsoe 3x daily LANCETS THIN  MISC (LANCETS) use to test blood glucose 3x daily SINGULAIR 10 MG TABS (MONTELUKAST SODIUM) Take 1 tablet by mouth once a day LANTUS 100 UNIT/ML SOLN (INSULIN GLARGINE) take 50 units daily. CRESTOR 5 MG TABS (ROSUVASTATIN CALCIUM) take 1 pill by mouth  daily. ALLEGRA 180 MG TABS (FEXOFENADINE HCL) take 1 pill by mouth daily as needed for allergy  Current Allergies: No known allergies     Risk Factors: Tobacco use:  quit    Year quit:  only at age 55 Passive smoke exposure:  no Drug use:  yes    Substance:  marijuana Alcohol use:  no Exercise:  yes    Times per week:  2    Type:  walking Seatbelt use:  100 %   Review of Systems      See HPI   Physical Exam  General:     alert.   Mouth:     pharynx pink and moist.   Lungs:     normal breath sounds, no crackles, and no wheezes.   Heart:     normal rate, regular rhythm, no murmur, no gallop, and no rub.   Abdomen:     soft, non-tender, normal bowel sounds, no distention, and no masses.   Extremities:     trace left pedal edema and trace right pedal edema.   Neurologic:      alert & oriented X3.      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 His metoprolol was changed by Dr. Broadus John on last visit for this reason. The pt. is still taking his left over metoprolol and will start his coreg after he completes his metoprolol.  Orders: T-TSH [id number]) T-Vitamin B12 [phone]) T-RPR (Syphilis) [phone])   Problem # 2:  PERCUTANEOUS TRANSLUMINAL CORONARY ANGIOPLASTY, HX OF (ICD-V45.82) The pt. is still taking his plavix, asa and BB. He hasn't seen his cardiologist since 2007. I will refer him to his heart doctor to find out if he needs any longer plavix. He is also thinking about using bike to go to his work, that is 14 miles twice daily. I asked him to start slowly and to increase his exercise as tolerated, but his cardiologist will best help him in this regard. Denies any CP or worsening SOB.  His updated medication list for this problem includes:    Plavix 75 Mg Tabs (Clopidogrel bisulfate) .Marland Kitchen... Take 1 tablet by mouth once a day    Accupril 40 Mg Tabs (Quinapril hcl) .Marland Kitchen... Take 1 tablet by mouth once a day for blood pressure    Aspir-low 81 Mg Tbec (Aspirin) .Marland Kitchen... Take 1 tablet by mouth once a day for circulation    Coreg 6.25 Mg Tabs (Carvedilol) .Marland Kitchen... Take 1 tablet by mouth two times a day    Hydrochlorothiazide 25 Mg Tabs (Hydrochlorothiazide) .Marland Kitchen... Take 1 tablet by mouth once a day  Orders: Cardiology Referral (Cardiology)   Problem # 3:  HYPERLIPIDEMIA, MIXED (ICD-272.2) According to the pt. his cardiologist stopped his lipitor when he started to have some muscle problem. He wants to restart crestor. I will start a low dose and follow closely his CK and liver function.  His updated medication list for this problem includes:    Niaspan 1000 Mg Cr-tabs (Niacin (antihyperlipidemic)) .Marland Kitchen... Take 2 tablet by mouth once a day    Crestor 5 Mg Tabs (Rosuvastatin calcium) .Marland Kitchen... Take 1 pill by  mouth daily.  Orders: T-Comprehensive Metabolic Panel [phone]) T-TSH [phone])  Labs Reviewed: Chol: 151 (04/08/2008)   HDL: 36 (04/08/2008)   LDL: 91 (04/08/2008)   TG: 122 (04/08/2008) SGOT: 29 (04/08/2008)   SGPT: 38 (04/08/2008)   Problem # 4:  ALLERGIC RHINITIS (ICD-477.9) He wants allegra instead of  loratidine. I asked him to take only if needed.  The following medications were removed from the medication list:    Alavert 10 Mg Tabs (Loratadine) .Marland Kitchen... Take 1 tablet by mouth once a day  His updated medication list for this problem includes:    Nasonex 50 Mcg/act Susp (Mometasone furoate) .Marland Kitchen... Apply on spray on each nostril daily.    Allegra 180 Mg Tabs (Fexofenadine hcl) .Marland Kitchen... Take 1 pill by mouth daily as needed for allergy   Problem # 5:  HYPERTENSION (ICD-401.9) BP much improved today and actually in the lower range. Since he will start his coreg and since it's more effective than metoprolol on BP, I will follow this closely.  His updated medication list for this problem includes:    Accupril 40 Mg Tabs (Quinapril hcl) .Marland Kitchen... Take 1 tablet by mouth once a day for blood pressure    Coreg 6.25 Mg Tabs (Carvedilol) .Marland Kitchen... Take 1 tablet by mouth two times a day    Hydrochlorothiazide 25 Mg Tabs (Hydrochlorothiazide) .Marland Kitchen... Take 1 tablet by mouth once a day  Orders: T-Comprehensive Metabolic Panel ([id number]) T-TSH ([id number])  BP today: 109/65 Prior BP: 134/83 (07/20/2008)  Labs Reviewed: Creat: 1.13 (04/08/2008) Chol: 151 (04/08/2008)   HDL: 36 (04/08/2008)   LDL: 91 (04/08/2008)   TG: 122 (04/08/2008)   Problem # 6:  DIABETES MELLITUS, TYPE II (ICD-250.00) Tyler Wilkinson says he's taking lantus for last 2 years and this is not in his chart. He says he is taking 60 units of lantus and 20 units of Novolin R two times a day. His home CBG ranges from 49-533. He usually doesn't take Novolin at work, which is the place where he mostly eats. I will lower his Lantus to  50 and asked him to check his CBG 3 times daily and see again in 2 weeks.  The following medications were removed from the medication list:    Novolin N 100 Unit/ml Susp (Insulin isophane human) ..... Is injecting 30 units in afternoon and 20 units  ~ 2- 3 am before  His updated medication list for this problem includes:    Accupril 40 Mg Tabs (Quinapril hcl) .Marland Kitchen... Take 1 tablet by mouth once a day for blood pressure    Aspir-low 81 Mg Tbec (Aspirin) .Marland Kitchen... Take 1 tablet by mouth once a day for circulation    Novolin R 100 Unit/ml Soln (Insulin regular human) ..... Inject 30-35 units subcutaneously with each meal    Lantus 100 Unit/ml Soln (Insulin glargine) .Marland Kitchen... Take 50 units daily.  Orders: Capillary Blood Glucose ([id number]) Fingerstick ([id number])  Labs Reviewed: HgBA1c: 9.1 (07/20/2008)   Creat: 1.13 (04/08/2008)   Microalbumin: 0.76 (04/08/2008)  Last Eye Exam: No diabetic retinopathy.   Visual acuity OD (best corrected): 20/25 Visual acuity OS (best corrected):     20/30 Intraocular pressure OD:     19 Intraocular pressure OS:     27 Dr Dione Booze f/u 1 yr  (06/03/2007)   Complete Medication List: 1)  Plavix 75 Mg Tabs (Clopidogrel bisulfate) .... Take 1 tablet by mouth once a day 2)  Niaspan 1000 Mg Cr-tabs (Niacin (antihyperlipidemic)) .... Take 2 tablet by mouth once a day 3)  Accupril 40 Mg Tabs (Quinapril hcl) .... Take 1 tablet by mouth once a day for blood pressure 4)  Viagra 100 Mg Tabs (Sildenafil citrate) .... As needed 5)  Flax Seed Oil 1000 Mg Caps (Flaxseed (linseed)) .... Take 1 capsule by mouth two times a day 6)  Famotidine 40 Mg Tabs (Famotidine) .... Take 1 tablet by mouth once a day for upset stomach 7)  Aspir-low 81 Mg Tbec (Aspirin) .... Take 1 tablet by mouth once a day for circulation 8)  Coreg 6.25 Mg Tabs (Carvedilol) .... Take 1 tablet by mouth two times a day 9)  Albuterol 90 Mcg/act Aers (Albuterol) .... Take 2 puffs as needed for respiratory difficulty up to  every 3 hourly. 10)  Hydrochlorothiazide 25 Mg Tabs (Hydrochlorothiazide) .... Take 1 tablet by mouth once a day 11)  Sudafed Pe Sinus/allergy 4-10 Mg Tabs (Chlorpheniramine-phenylephrine) .... Take 1 tablet by mouth two times a day 12)  Nasonex 50 Mcg/act Susp (Mometasone furoate) .... Apply on spray on each nostril daily. 13)  Novolin R 100 Unit/ml Soln (Insulin regular human) .... Inject 30-35 units subcutaneously with each meal 14)  Insulin Syringe 30g X 5/16" 1 Ml Misc (Insulin syringe-needle u-100) .... Use to inject insulin 15)  Truetrack Test Strp (Glucose blood) .... Use to test blood glucsoe 3x daily 16)  Lancets Thin Misc (Lancets) .... Use to test blood glucose 3x daily 17)  Singulair 10 Mg Tabs (Montelukast sodium) .... Take 1 tablet by mouth once a day 18)  Lantus 100 Unit/ml Soln (Insulin glargine) .... Take 50 units daily. 19)  Crestor 5 Mg Tabs (Rosuvastatin calcium) .... Take 1 pill by mouth daily. 20)  Allegra 180 Mg Tabs (Fexofenadine hcl) .... Take 1 pill by mouth daily as needed for allergy   Patient Instructions: 1)  Please schedule a follow-up appointment in 2 weeks. 2)  Limit your Sodium (Salt) to less than 2 grams a day(slightly less than 1/2 a teaspoon) to prevent fluid retention, swelling, or worsening of symptoms. 3)  It is important that you exercise regularly at least 20 minutes 5 times a week. If you develop chest pain, have severe difficulty breathing, or feel very tired , stop exercising immediately and seek medical attention. 4)  You need to lose weight. Consider a lower calorie diet and regular exercise.  5)  Check your blood sugars regularly. If your readings are usually above : or below 70 you should contact our office. 6)  It is important that your Diabetic A1c level is checked every 3 months. 7)  See your eye doctor yearly to check for diabetic eye damage. 8)  Check your feet each night for sore areas, calluses or signs of infection. 9)  Check your Blood  Pressure regularly. If it is above: you should make an appointment.   Prescriptions: ALLEGRA 180 MG TABS (FEXOFENADINE HCL) take 1 pill by mouth daily as needed for allergy  #30 x 3   Entered and Authorized by:   Jason Coop MD   Signed by:   Jason Coop MD on 09/14/2008   Method used:   Print then Give to Patient   RxID:   [id number] CRESTOR 5 MG TABS (ROSUVASTATIN CALCIUM) take 1 pill by mouth daily.  #30 x 3   Entered and Authorized by:   Jason Coop MD   Signed by:   Jason Coop MD on 09/14/2008   Method used:   Print then Give to Patient   RxID:   [id number]

## 2010-09-18 NOTE — Assessment & Plan Note (Signed)
Summary: achy,diarrhea/pcp-pokharel/hla   Vital Signs:  Patient Profile:   55 Years Old Male Height:     73 inches (185.42 cm) Weight:      226.03 pounds (102.74 kg) BMI:     29.93 Temp:     99.6 degrees F (37.56 degrees C) oral Pulse rate:   84 / minute BP sitting:   165 / 85  (left arm)  Pt. in pain?   yes    Location:   sinuses, eyes and chest    Intensity:   10    Type:       pressure  Vitals Entered By: Angelina Ok RN (March 17, 2008 9:54 AM)              Is Patient Diabetic? Yes  Nutritional Status BMI of 25 - 29 = overweight CBG Result 168  Have you ever been in a relationship where you felt threatened, hurt or afraid?No   Does patient need assistance? Functional Status Self care Ambulation Normal     PCP:  Tacey Ruiz MD  Chief Complaint:  Non-productive cough, chills, no fever per pt.  sinus pressure and eyes, diarrhea , clear mucous from runny nose, and sneezing.  History of Present Illness: 61 tears old amle with pmh of Dm, GERD, HTN, Hyperlipidemia, Asthma, chronic allergic rhinitis and CAD; who came to the [facility] complaining of anorexia, runny nose, chills, non productive cough (intense by his description, causin intercostal and back pain), nasal congestion, PND, scratchy throat, sneezing and HA; present for about 4 days priro to visit.  Pt reports that his symptoms feels exactly as the symptoms he experienced whith his allergic attacks in the past. Pt denies fever and mentioned that his actual treatment for allergy prevention is not helping him a lot.  He reports to be compliant with his medications, but reports that his Bp have been elevated lately. Pt denies wheezing or flare of his asthma.    Prior Medications Reviewed Using: List Brought by Patient  Prior Medication List:  LANTUS 100 UNIT/ML SOLN (INSULIN GLARGINE) Take 60 units at bedtime for diabetes. PLAVIX 75 MG TABS (CLOPIDOGREL BISULFATE) Take 1 tablet by mouth once a day NIASPAN 500 MG TBCR  (NIACIN (ANTIHYPERLIPIDEMIC)) Take 2 tablets by mouth once a day ACCUPRIL 40 MG TABS (QUINAPRIL HCL) Take 1 tablet by mouth once a day for blood pressure VIAGRA 100 MG TABS (SILDENAFIL CITRATE) as needed FLONASE 50 MCG/ACT SUSP (FLUTICASONE PROPIONATE) Give two puffs to each nostril every day FLAX SEED OIL 1000 MG CAPS (FLAXSEED (LINSEED)) Take 1 capsule by mouth two times a day FAMOTIDINE 40 MG TABS (FAMOTIDINE) Take 1 tablet by mouth once a day for upset stomach ASPIR-LOW 81 MG TBEC (ASPIRIN) Take 1 tablet by mouth once a day for circulation NOVOLOG FLEXPEN   SOLN (INSULIN ASPART SOLN) Take 30-35 units with each meal TOPROL XL 100 MG  TB24 (METOPROLOL SUCCINATE) Take 1 tablet by mouth once a day ALLEGRA 180 MG  TABS (FEXOFENADINE HCL) Take 1 tablet by mouth once a day ALBUTEROL 90 MCG/ACT  AERS (ALBUTEROL) take 2 puffs as needed for respiratory difficulty up to every 3 hourly. HYDROCHLOROTHIAZIDE 12.5 MG  TABS (HYDROCHLOROTHIAZIDE) Take 1 tablet by mouth once a day   Current Allergies (reviewed today): No known allergies   Past Medical History:    Reviewed history from 07/24/2007 and no changes required:       Diabetes mellitus, type II       Hypertension  Allergic rhinitis       ED       Bronchitis       Hyperlipidemia       hx/o MI, inferior wall s/p stent in 2006   Social History:    Single    Works as a Editor, commissioning marijuana   Risk Factors:  Tobacco use:  quit    Year quit:  only at age 21 Passive smoke exposure:  no Drug use:  yes    Substance:  marijuana    Comments:  ocasional Alcohol use:  no Exercise:  yes    Times per week:  2    Type:  walking Seatbelt use:  100 %   Review of Systems  The patient denies fever, syncope, peripheral edema, hemoptysis, abdominal pain, melena, hematochezia, severe indigestion/heartburn, hematuria, unusual weight change, and enlarged lymph nodes.     Physical Exam  General:     alert,  well-developed, well-nourished, and well-hydrated.   Head:     normocephalic, atraumatic, and no abnormalities observed.  Mild maxillary tenderness and frontal tenderness. Nose:     no external deformity, no external erythema, positive clear nasal discharge.   Mouth:     pharynx pink and moist, no erythema, no exudates, no postnasal drip, poor dentition, and teeth missing.   Neck:     supple.   Lungs:     normal respiratory effort, no intercostal retractions, no accessory muscle use, and normal breath sounds.   Heart:     normal rate, regular rhythm, and no murmur.   Abdomen:     obese, soft, non-tender, normal bowel sounds, no distention, no masses, and no guarding.   Msk:     normal ROM.  normal strength and tone. Extremities:     No clubbing, cyanosis, edema, or deformity noted with normal full range of motion of all joints.   Neurologic:     No cranial nerve deficits noted. Station and gait are normal. Plantar reflexes are down-going bilaterally. DTRs are symmetrical throughout. Sensory, motor and coordinative functions appear intact.    Impression & Recommendations:  Problem # 1:  SINUSITIS (ICD-473.9) Pt with acute severe sinusitis. Will give empirically treatment with sudaphed, mucinex and loratadine. Pt instructed to use zyrtec and nasonex to control his allergy symptoms when this acute attack  is over.  The following medications were removed from the medication list:    Flonase 50 Mcg/act Susp (Fluticasone propionate) .Marland Kitchen... Give two puffs to each nostril every day  His updated medication list for this problem includes:    Sudafed Pe Sinus/allergy 4-10 Mg Tabs (Chlorpheniramine-phenylephrine) .Marland Kitchen... Take 1 tablet by mouth two times a day    Nasonex 50 Mcg/act Susp (Mometasone furoate) .Marland Kitchen... Apply on spray on each nostril daily.    Mucinex 600 Mg Xr12h-tab (Guaifenesin) .Marland Kitchen... Take 1 tablet by mouth two times a day   Problem # 2:  DYSPEPSIA (ICD-536.8) Pt reports well  controlled with the famotidine and denies any reflux sensation or severe indigestion recently. No changes will be made to his regimen.  Problem # 3:  HYPERTENSION (ICD-401.9) Bp today is elevated, but pt have been using OTC medications for the cold which can increase the Bp. No changes will be made today;  Bp will be check again in 1 month and treatment would be adjusted as needed. Meanwhile pt instructed to watch the amount of salt in his diet and to eat low in fat.  BP  today: 165/85 Prior BP: 134/75 (01/04/2008)  Labs Reviewed: Creat: 1.19 (01/04/2008) Chol: 143 (07/24/2007)   HDL: 36 (07/24/2007)   LDL: 87 (07/24/2007)   TG: 100 (07/24/2007)  His updated medication list for this problem includes:    Accupril 40 Mg Tabs (Quinapril hcl) .Marland Kitchen... Take 1 tablet by mouth once a day for blood pressure    Toprol Xl 100 Mg Tb24 (Metoprolol succinate) .Marland Kitchen... Take 1 tablet by mouth once a day    Hydrochlorothiazide 12.5 Mg Tabs (Hydrochlorothiazide) .Marland Kitchen... Take 1 tablet by mouth once a day   Problem # 4:  DIABETES MELLITUS, TYPE II (ICD-250.00) Last A1c was 8.3. Pt is using lantus and ssi for meals coverage. Pt instructed to exercise when his cold allergy symptoms dissapears and to follow low carb diet. no changes will be made to his regimen today, pt will be follow by PCP in 1 month in order to address adjustments of his insulin, reevaluation of his creatine level and A1C.  Labs Reviewed: HgBA1c: 8.3 (10/21/2007)   Creat: 1.19 (01/04/2008)   Microalbumin: 1.24 (12/19/2006)  Last Eye Exam: No diabetic retinopathy.   Visual acuity OD (best corrected): 20/25 Visual acuity OS (best corrected):     20/30 Intraocular pressure OD:     19 Intraocular pressure OS:     27 Dr Dione Booze f/u 1 yr  (06/03/2007)  His updated medication list for this problem includes:    Lantus 100 Unit/ml Soln (Insulin glargine) .Marland Kitchen... Take 60 units at bedtime for diabetes.    Accupril 40 Mg Tabs (Quinapril hcl) .Marland Kitchen... Take 1  tablet by mouth once a day for blood pressure    Aspir-low 81 Mg Tbec (Aspirin) .Marland Kitchen... Take 1 tablet by mouth once a day for circulation    Novolog Flexpen Soln (Insulin aspart soln) .Marland Kitchen... Take 30-35 units with each meal  Orders: Capillary Blood Glucose (19147) Capillary Blood Glucose (82948) Fingerstick (36416) Fingerstick (82956)   Complete Medication List: 1)  Lantus 100 Unit/ml Soln (Insulin glargine) .... Take 60 units at bedtime for diabetes. 2)  Plavix 75 Mg Tabs (Clopidogrel bisulfate) .... Take 1 tablet by mouth once a day 3)  Niaspan 500 Mg Tbcr (Niacin (antihyperlipidemic)) .... Take 2 tablets by mouth once a day 4)  Accupril 40 Mg Tabs (Quinapril hcl) .... Take 1 tablet by mouth once a day for blood pressure 5)  Viagra 100 Mg Tabs (Sildenafil citrate) .... As needed 6)  Flax Seed Oil 1000 Mg Caps (Flaxseed (linseed)) .... Take 1 capsule by mouth two times a day 7)  Famotidine 40 Mg Tabs (Famotidine) .... Take 1 tablet by mouth once a day for upset stomach 8)  Aspir-low 81 Mg Tbec (Aspirin) .... Take 1 tablet by mouth once a day for circulation 9)  Novolog Flexpen Soln (Insulin aspart soln) .... Take 30-35 units with each meal 10)  Toprol Xl 100 Mg Tb24 (Metoprolol succinate) .... Take 1 tablet by mouth once a day 11)  Albuterol 90 Mcg/act Aers (Albuterol) .... Take 2 puffs as needed for respiratory difficulty up to every 3 hourly. 12)  Hydrochlorothiazide 12.5 Mg Tabs (Hydrochlorothiazide) .... Take 1 tablet by mouth once a day 13)  Alavert 10 Mg Tabs (Loratadine) .... Take 1 tablet by mouth once a day 14)  Sudafed Pe Sinus/allergy 4-10 Mg Tabs (Chlorpheniramine-phenylephrine) .... Take 1 tablet by mouth two times a day 15)  Nasonex 50 Mcg/act Susp (Mometasone furoate) .... Apply on spray on each nostril daily. 16)  Mucinex 600 Mg Xr12h-tab (  Guaifenesin) .... Take 1 tablet by mouth two times a day   Patient Instructions: 1)  Get plenty of rest, drink lots of clear liquids,  and use Tylenol 500mg  (two tablets every 8 hours as needed) for fever and comfort. Return in 7-10 days if you're not better:sooner if you're feeling worse. 2)  Please schedule a follow-up appointment in 3 weeks with PCP for regular followup of your chronic conditions. 3)  Take your medications as prescribed. 4)  Remember to hold your allegra and flonase for now and to start using zyrtec (After  the acute attack has stop)  and Nasonex instead.   Prescriptions: MUCINEX 600 MG  XR12H-TAB (GUAIFENESIN) Take 1 tablet by mouth two times a day  #30 x 0   Entered and Authorized by:   Vassie Loll MD   Signed by:   Vassie Loll MD on 03/17/2008   Method used:   Print then Give to Patient   RxID:   [id number] NASONEX 50 MCG/ACT  SUSP (MOMETASONE FUROATE) Apply on spray on each nostril daily.  #1 month x 3   Entered and Authorized by:   Vassie Loll MD   Signed by:   Vassie Loll MD on 03/17/2008   Method used:   Print then Give to Patient   RxID:   [id number] SUDAFED PE SINUS/ALLERGY 4-10 MG  TABS (CHLORPHENIRAMINE-PHENYLEPHRINE) Take 1 tablet by mouth two times a day  #60 x 0   Entered and Authorized by:   Vassie Loll MD   Signed by:   Vassie Loll MD on 03/17/2008   Method used:   Print then Give to Patient   RxID:   [id number] ALAVERT 10 MG  TABS (LORATADINE) Take 1 tablet by mouth once a day  #30 x 0   Entered and Authorized by:   Vassie Loll MD   Signed by:   Vassie Loll MD on 03/17/2008   Method used:   Print then Give to Patient   RxID:   [id number]  ]

## 2010-09-18 NOTE — Progress Notes (Signed)
Summary: refill/gg  Phone Note Refill Request  on June 04, 2010 12:01 PM  Refills Requested: Medication #1:  VIAGRA 100 MG TABS take 1/2 pill by mouth 1 hour before sexual activity   Last Refilled: 01/30/2010  Method Requested: Fax to Local Pharmacy Initial call taken by: Merrie Roof RN,  June 04, 2010 12:02 PM  Follow-up for Phone Call        Has Oct Appt. Will refill Follow-up by: Blanch Media MD,  June 04, 2010 12:08 PM    Prescriptions: VIAGRA 100 MG TABS (SILDENAFIL CITRATE) take 1/2 pill by mouth 1 hour before sexual activity, no more than once daily.  #30 x 0   Entered and Authorized by:   Blanch Media MD   Signed by:   Blanch Media MD on 06/04/2010   Method used:   Faxed to ...       [facility] (retail)       [address]       [place], [place]  [address]       Ph: [phone]       Fax: [phone]   RxID:   [id number]

## 2010-09-18 NOTE — Progress Notes (Signed)
Summary: refill/ hla  Phone Note Refill Request Message from:  Fax from Pharmacy on January 31, 2010 3:48 PM  Refills Requested: Medication #1:  LANTUS 100 UNIT/ML SOLN take 55 units daily.   Dosage confirmed as above?Dosage Confirmed   Last Refilled: 3/24 Initial call taken by: Marin Roberts RN,  January 31, 2010 3:48 PM  Follow-up for Phone Call       Follow-up by: Jason Coop MD,  January 31, 2010 3:54 PM    Prescriptions: LANTUS 100 UNIT/ML SOLN (INSULIN GLARGINE) take 55 units daily.  #1 x 3   Entered and Authorized by:   Jason Coop MD   Signed by:   Jason Coop MD on 01/31/2010   Method used:   Telephoned to ...       [facility] (retail)       [address] [place], [place]  [address]       Ph: [phone]       Fax: [phone]   RxID:   [id number]   Appended Document: refill/ hla Lantus Rx refill request faxed to [facility]  [facility].

## 2010-09-18 NOTE — Assessment & Plan Note (Signed)
Summary: PER DR. PITZGERALD/ SB.   Vital Signs:  Patient Profile:   55 Years Old Male Height:     73 inches (185.42 cm) Weight:      227.3 pounds BMI:     30.10 Temp:     97.3 degrees F BP sitting:   145 / 92  (right arm)  Pt. in pain?   no  Vitals Entered By: Dorie Rank RN (July 24, 2007 3:39 PM)              Is Patient Diabetic? Yes  Nutritional Status BMI of > 30 = obese CBG Result 134  Does patient need assistance? Functional Status Self care Ambulation Normal      PCP:  Tacey Ruiz MD  Chief Complaint:  "dizziness and wobbly".  History of Present Illness: 55 yo man with Diabetes mellitus, type II, HTN, AR, ED, HL, h/o bronchitis, and CAD s/p MI (s/p RCA stent 2006)  last seen by Dr. Kerby Nora in the clinic on 10/28 and seen by Jamison Neighbor on 06/23/07. His last A1c was 8.3. He was taking  Lantus 60 QD, with Novolog R SSI. He was supposed to increase to 70 units in October.  He is now off Metformn for about 2 months because of GI upset.   Today (07/24/07), he is complaining of dizziness and being "wobbly." Last night his symptoms started around 2AM. He woke with similar symptoms. He felt like he was "drinking." Describes light headedness instead of vertiginous. Says that he never had symptoms before.   Patient denies fever, chills. He describes some nausea, but denies vomiting. He denies diarrhea or constipation. He says that he has pressure and has been sneezing more lately. He is not taking any medicines OTC. He has been having sinus issues for sometime. He denies any CP or any dyspnea. 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  He says his ears were ringing a few days ago. He does admit that he has had similar symptoms in the past when his sinuses have been bad. He denies that he is feeling worse now then last night.      Prior Medications :  LANTUS 100 UNIT/ML SOLN (INSULIN GLARGINE) Take 70 units at bedtime for  diabetes. PLAVIX 75 MG TABS (CLOPIDOGREL BISULFATE) Take 1 tablet by mouth once a day NIASPAN 500 MG TBCR (NIACIN (ANTIHYPERLIPIDEMIC)) Take 2 tablets by mouth once a day ACCUPRIL 40 MG TABS (QUINAPRIL HCL) Take 1 tablet by mouth once a day for blood pressure VIAGRA 100 MG TABS (SILDENAFIL CITRATE) as needed FLONASE 50 MCG/ACT SUSP (FLUTICASONE PROPIONATE) Give one puff to each nostril every day FLAX SEED OIL 1000 MG CAPS (FLAXSEED (LINSEED)) Take 1 capsule by mouth two times a day FAMOTIDINE 40 MG TABS (FAMOTIDINE) Take 1 tablet by mouth once a day for upset stomach ASPIR-LOW 81 MG TBEC (ASPIRIN) Take 1 tablet by mouth once a day for circulation NOVOLOG FLEXPEN   SOLN (INSULIN ASPART SOLN) Take 10 units with each meal TOPROL XL 100 MG  TB24 (METOPROLOL SUCCINATE) Take 1 tablet by mouth once a day    Current Allergies (reviewed today): ! * GUAIFENESIN Updated/Current Medications (including changes made in today's visit):  LANTUS 100 UNIT/ML SOLN (INSULIN GLARGINE) Take 70 units at bedtime for diabetes. PLAVIX 75 MG TABS (CLOPIDOGREL BISULFATE) Take 1 tablet by mouth once a day NIASPAN 500 MG TBCR (NIACIN (ANTIHYPERLIPIDEMIC)) Take 2 tablets by mouth once a day ACCUPRIL 40  MG TABS (QUINAPRIL HCL) Take 1 tablet by mouth once a day for blood pressure VIAGRA 100 MG TABS (SILDENAFIL CITRATE) as needed FLONASE 50 MCG/ACT SUSP (FLUTICASONE PROPIONATE) Give two puffs to each nostril every day FLAX SEED OIL 1000 MG CAPS (FLAXSEED (LINSEED)) Take 1 capsule by mouth two times a day FAMOTIDINE 40 MG TABS (FAMOTIDINE) Take 1 tablet by mouth once a day for upset stomach ASPIR-LOW 81 MG TBEC (ASPIRIN) Take 1 tablet by mouth once a day for circulation NOVOLOG FLEXPEN   SOLN (INSULIN ASPART SOLN) Take 10 units with each meal TOPROL XL 100 MG  TB24 (METOPROLOL SUCCINATE) Take 1 tablet by mouth once a day * ALLEGRA    Past Medical History:    Reviewed history from 12/19/2006 and no changes required:        Diabetes mellitus, type II       Hypertension       Allergic rhinitis       ED       Bronchitis       Hyperlipidemia       hx/o MI, inferior wall s/p stent 2006       Cerumen impaction  Past Surgical History:    Reviewed history from 12/19/2006 and no changes required:       PTCA/stent to RCA    Risk Factors: Tobacco use:  quit    Year quit:  only at age 39    Physical Exam  General:     alert, well-nourished, well-hydrated, and appropriate dress.   Head:     atraumatic and no abnormalities observed.   Ears:     R cerumen impaction, pretty significant. L ear normal and R cerumen impaction.   Nose:     no external deformity, no external erythema, mucosal erythema, mucosal edema, L frontal sinus tenderness, and R frontal sinus tenderness.  Some nasal discharge.  Mouth:     Has a missing cap in his front tooth. Has no erythema, no exudates, no postnasal drip, poor dentition, and teeth missing.   Lungs:     Normal respiratory effort, chest expands symmetrically. Lungs are clear to auscultation, no crackles or wheezes. Heart:     Normal rate and regular rhythm. S1 and S2 normal without gallop, murmur, click, rub or other extra sounds. Abdomen:     soft, normal bowel sounds, and epigastric tenderness.   Extremities:     trace left pedal edema and trace right pedal edema.      Impression & Recommendations:  Problem # 1:  SINUSITIS (ICD-473.9) Assessment: Deteriorated I believe that his dizziness is mostly related to sinus disease. He has sinus TTP, although mild, and has boggy nasal mucosa. He does not have chest pain or shortness of breath at this time. 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 His EKG was normal. His orthostatics were normal. His symptoms really do not sound cardiac in nature. It could also be related to hypoglycemia, so we checked a CBG today and it as normal at 134. He had labwork, and we are awaiting his results. He was  informed to take his Allegra and Flonase as prescribed. He just recently started back taking the Flonase last week. He does have a significant amount of right ear cerumen which could also contribute, but declined to have removal of the cerumen today. He was told to use Debrox OTC at home in the interim. He was also informed to try nasal saline solution as needed  for additional nasal/sinus relief. I do not think antibiotics are needed at this time, but if his symptoms continue, this may be an option.    His updated medication list for this problem includes:    Flonase 50 Mcg/act Susp (Fluticasone propionate) .Marland Kitchen... Give two puffs to each nostril every day    Allegra (dose unknown)   Complete Medication List: 1)  Lantus 100 Unit/ml Soln (Insulin glargine) .... Take 70 units at bedtime for diabetes. 2)  Plavix 75 Mg Tabs (Clopidogrel bisulfate) .... Take 1 tablet by mouth once a day 3)  Niaspan 500 Mg Tbcr (Niacin (antihyperlipidemic)) .... Take 2 tablets by mouth once a day 4)  Accupril 40 Mg Tabs (Quinapril hcl) .... Take 1 tablet by mouth once a day for blood pressure 5)  Viagra 100 Mg Tabs (Sildenafil citrate) .... As needed 6)  Flonase 50 Mcg/act Susp (Fluticasone propionate) .... Give two puffs to each nostril every day 7)  Flax Seed Oil 1000 Mg Caps (Flaxseed (linseed)) .... Take 1 capsule by mouth two times a day 8)  Famotidine 40 Mg Tabs (Famotidine) .... Take 1 tablet by mouth once a day for upset stomach 9)  Aspir-low 81 Mg Tbec (Aspirin) .... Take 1 tablet by mouth once a day for circulation 10)  Novolog Flexpen Soln (Insulin aspart soln) .... Take 10 units with each meal 11)  Toprol Xl 100 Mg Tb24 (Metoprolol succinate) .... Take 1 tablet by mouth once a day 12)  Allegra   Other Orders: Capillary Blood Glucose ([address]) Fingerstick (60454) Capillary Blood Glucose (82948) Fingerstick (36416) 12 Lead EKG (12 Lead EKG)   Patient Instructions: 1)  Please schedule an appointment with  your primary doctor as scheduled.  2)  You can take a nasal saline solution as needed for your sinus/nasal symptoms.  3)  Take your antihistamine and nasal steroid as prescribed . Take the nasal saline solution before the nasal steroids. 4)  If you develope fevers, chills please let us know.     ]

## 2010-09-18 NOTE — Miscellaneous (Signed)
Summary: Immunization Entry    Influenza Vaccine    Vaccine Type: Fluvax Non-MCR    Site: left deltoid    Mfr: GlaxoSmithKline    Dose: 0.5 ml    Route: IM    Given by: Chinita Pester RN    Exp. Date: 02/15/2009    Lot #: [id number]    VIS given: 03/12/07 version given May 04, 2008.  Flu Vaccine Consent Questions    Do you have a history of severe allergic reactions to this vaccine? no    Any prior history of allergic reactions to egg and/or gelatin? no    Do you have a sensitivity to the preservative Thimersol? no    Do you have a past history of Guillan-Barre Syndrome? no    Do you currently have an acute febrile illness? no    Have you ever had a severe reaction to latex? no    Vaccine information given and explained to patient? yes

## 2010-09-18 NOTE — Assessment & Plan Note (Signed)
Summary: FU VISIT/VS   Vital Signs:  Patient Profile:   55 Years Old Male Height:     73 inches (185.42 cm) Temp:     97.0 degrees F oral Pulse rate:   77 / minute BP sitting:   151 / 79  (right arm)  Pt. in pain?   no  Vitals Entered ByFilomena Jungling (Jan 05, 2007 2:44 PM)              Is Patient Diabetic? Yes  CBG Result 118  Does patient need assistance? Functional Status Self care Ambulation Normal   PCP:  Coralie Carpen MD  Chief Complaint:  recheck.  History of Present Illness: Tyler Wilkinson is a 73 yr WM here for f/u on [name]. He did manage to increase his Lantus to 53 units at bedtime, but still has large fluctuations in his numbers. He claims that part of this is related to his poor diet compliance. He apparently is still using Humalog SSI  but only used 3 doses since our last. He feels the increased Lantus is the cause of this.   He completed the Levaquin samples x 5 days I gave him at our last vist for uncomplicated sinuitis with bronchitis.   For review his daily schedule is : 11am-1am is wake-up time Bkfast at [employer] is 7-8am Dinner is 1230-1pm Go to sleep at 3pm  He apparently went to the ER on 8 May, the day after seeing me in clnic. He was treated with IV fluids and given scripts for Zyrtec-D, Albuterol inhaler and Tussinex. All were not filled as either too expensive. He feels much better today, but still has some residual cough.   Eye-Dr. Rosita Fire, no DM retinopathy-Seen in Sep 07.    Current Allergies (reviewed today): No known allergies  Updated/Current Medications (including changes made in today's visit):  LANTUS 100 UNIT/ML SOLN (INSULIN GLARGINE) Take 53 units at bedtime for diabetes. PLAVIX 75 MG TABS (CLOPIDOGREL BISULFATE) Take 1 tablet by mouth once a day NIASPAN 500 MG TBCR (NIACIN (ANTIHYPERLIPIDEMIC)) Take 2 tablets by mouth once a day ACCUPRIL 40 MG TABS (QUINAPRIL HCL) Take 1 tablet by mouth once a day for blood  pressure GLUCOPHAGE 1000 MG TABS (METFORMIN HCL) Take 1 tablet by mouth two times a day VIAGRA 100 MG TABS (SILDENAFIL CITRATE) as needed FLONASE 50 MCG/ACT SUSP (FLUTICASONE PROPIONATE) Give one puff to each nostril every day FLAX SEED OIL 1000 MG CAPS (FLAXSEED (LINSEED)) Take 1 capsule by mouth two times a day ALLEGRA 180 MG TABS (FEXOFENADINE HCL) Take 1 tablet by mouth once a day for allergies FAMOTIDINE 40 MG TABS (FAMOTIDINE) Take 1 tablet by mouth once a day for upset stomach TOPROL XL 50 MG TB24 (METOPROLOL SUCCINATE) Take 1 tablet by mouth once a day for blood pressure ASPIR-LOW 81 MG TBEC (ASPIRIN) Take 1 tablet by mouth once a day for circulation   Past Medical History:    Reviewed history from 12/19/2006 and no changes required:       Diabetes mellitus, type II       Hypertension       Allergic rhinitis       ED       Bronchitis       Hyperlipidemia       hx/o MI, inferior wall       Cerumen impaction  Past Surgical History:    Reviewed history from 12/19/2006 and no changes required:       PTCA/stent  to RCA    Risk Factors: Tobacco use:  quit    Year quit:  only at age 53   Review of Systems  The patient denies fever, chest pain, peripheral edema, abdominal pain, and severe indigestion/heartburn.     Physical Exam  General:     alert, well-developed, and mild distress.   Neck:     supple.   Lungs:     Normal respiratory effort, chest expands symmetrically. Lungs with mild bibasilar crackles but no wheezes. Heart:     regular rhythm, no murmur, no gallop, no rub, and tachycardia.   Abdomen:     soft.   Extremities:     No clubbing, cyanosis, edema, or deformity noted with normal full range of motion of all joints.   Skin:     no rashes and no petechiae.   Cervical Nodes:     No lymphadenopathy noted    Impression & Recommendations:  Problem # 1:  DIABETES MELLITUS, TYPE II (ICD-250.00) His Log was reviewed and the variation very apparent. He  apparently has 250s-320s during his work-time hours and when awakens in the AM is mid-low 80s to 90s. He apparently is still using Novolog SSI if he thinks he's going to eat a big meal. Overall, Tyler Wilkinson has improved with less hypoglycemia episodes since converting to Lantus from 70/30, I still feel we can work with him on Lantus titration with eventual elimination of his Novolog. In the interim, I suppose he will continue to use one or two doses for very high values.   His eye exam is up to date as of Sep 07.  His updated medication list for this problem includes:    Lantus 100 Unit/ml Soln (Insulin glargine) .Marland Kitchen... Take 53 units at bedtime for diabetes.    Accupril 40 Mg Tabs (Quinapril hcl) .Marland Kitchen... Take 1 tablet by mouth once a day for blood pressure    Glucophage 1000 Mg Tabs (Metformin hcl) .Marland Kitchen... Take 1 tablet by mouth two times a day    Aspir-low 81 Mg Tbec (Aspirin) .Marland Kitchen... Take 1 tablet by mouth once a day for circulation  Orders: Ophthalmology Referral (Ophthalmology)  Labs Reviewed: HgBA1c: 8.3 (12/19/2006)   Creat: 0.94 (12/19/2006)      Problem # 2:  HYPERLIPIDEMIA, MIXED (ICD-272.2) His LDL is not at goal as of our last visit. His LDL was 68 back in Jan. This is likely poor diet he thinks and we should reevaluate in 6 wks. If still high, we will start a statin   His updated medication list for this problem includes:    Niaspan 500 Mg Tbcr (Niacin (antihyperlipidemic)) .Marland Kitchen... Take 2 tablets by mouth once a day  Labs Reviewed: Chol: 158 (12/19/2006)   HDL: 43 (12/19/2006)   LDL: 92 (12/19/2006)   TG: 113 (12/19/2006) SGOT: 27 (12/19/2006)   SGPT: 52 (12/19/2006)   Problem # 3:  CORONARY ARTERY DISEASE (ICD-414.00) He has been taking his Toprol XL for the last few months. We held this temporarily for some hypoglycemia issues, but now that [name] mgt is better managed, we need to continue this for now.   His updated medication list for this problem includes:    Plavix 75 Mg Tabs  (Clopidogrel bisulfate) .Marland Kitchen... Take 1 tablet by mouth once a day    Accupril 40 Mg Tabs (Quinapril hcl) .Marland Kitchen... Take 1 tablet by mouth once a day for blood pressure    Toprol Xl 50 Mg Tb24 (Metoprolol succinate) .Marland Kitchen... Take 1 tablet  by mouth once a day for blood pressure    Aspir-low 81 Mg Tbec (Aspirin) .Marland Kitchen... Take 1 tablet by mouth once a day for circulation  Labs Reviewed: Chol: 158 (12/19/2006)   HDL: 43 (12/19/2006)   LDL: 92 (12/19/2006)   TG: 113 (12/19/2006)   Problem # 4:  HYPERTENSION (ICD-401.9) I suggest we increase his Accupril to 40mg  daily at this time. Although our Microalbumin:creatinine ratio is under good control, I think this increase is the next logical step in his HTN mgt. We should have doubled his Accupril at his last visit.   His updated medication list for this problem includes:    Accupril 40 Mg Tabs (Quinapril hcl) .Marland Kitchen... Take 1 tablet by mouth once a day for blood pressure    Toprol Xl 50 Mg Tb24 (Metoprolol succinate) .Marland Kitchen... Take 1 tablet by mouth once a day for blood pressure  BP today: 151/79 Prior BP: 161/81 (12/24/2006)  Labs Reviewed: Creat: 0.94 (12/19/2006) Chol: 158 (12/19/2006)   HDL: 43 (12/19/2006)   LDL: 92 (12/19/2006)   TG: 113 (12/19/2006)   Problem # 5:  Preventive Health Care (ICD-V70.0) Several visits back we discussed screening colonscopy, which he was interested in. I think we will discuss this at our next visit.   Medications Added to Medication List This Visit: 1)  Lantus 100 Unit/ml Soln (Insulin glargine) .... Take 53 units at bedtime for diabetes. 2)  Accupril 40 Mg Tabs (Quinapril hcl) .... Take 1 tablet by mouth once a day for blood pressure 3)  Toprol Xl 50 Mg Tb24 (Metoprolol succinate) .... Take 1 tablet by mouth once a day for blood pressure 4)  Aspir-low 81 Mg Tbec (Aspirin) .... Take 1 tablet by mouth once a day for circulation   Patient Instructions: 1)  Please schedule a follow-up appointment in 3 months.    Last LDL:                                                  92 (12/19/2006 9:54:00 PM)          Diabetic Foot Exam Last Podiatry Exam Date: 12/19/2006  Set Next Diabetic Foot Exam here: 12/18/2007    Vital Signs:  Patient Profile:   55 Years Old Male Height:     73 inches (185.42 cm) Temp:     97.0 degrees F oral Pulse rate:   77 / minute BP sitting:   151 / 79             CBG Result 118

## 2010-09-18 NOTE — Progress Notes (Signed)
Summary: diabetes support/dmr  Phone Note Call from Patient Call back at Home Phone [phone]   Caller: Patient Summary of Call: Patient called and asked for cholesterol values, and if he is spilling protein. Informed him that HDL is low and his protein was in the normal range> we discussed that he can address the low HDL  with activity and weight loss.  he said it helps him to know what his values are to change his behavior and work on improving his diet adn physical activity and that he is really trying. Proudly announced that hsi A1C is lower. We then again discussed his hypoglycemic episodes at work that he would be better preventing them with a healthy snack (discussed light canned fruit to always keep for back up for when he forets to bring other snack, nuts, cottage cheese and grapes, peanutbutter and celery) rather than what he does do: treats hios lows with excessive calories :chocolate candy and regular soda.   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  His goal is  to drop 5 pounds and pick up activity. asked him to define "pick up activity" but he never did. He requested that the results fo his test be mailed to him. Will ask Medical records to handle this.  Initial call taken by: Jamison Neighbor RD,CDE,  May 24, 2009 3:37 PM  Follow-up for Phone Call        I agree. Thank you Lupita Leash.  Follow-up by: Jason Coop MD,  May 24, 2009 6:26 PM

## 2010-09-18 NOTE — Assessment & Plan Note (Signed)
Summary: ACUTE-PT THINKS HE HAS THE FLU/CFB   Vital Signs:  Patient Profile:   55 Years Old Male Weight:      217.0 pounds Temp:     97.0 degrees F oral Pulse rate:   97 / minute BP sitting:   152 / 79  (right arm)  Vitals Entered By: Toney Rakes (September 04, 2006 3:40 PM)             Is Patient Diabetic? Yes  Nutritional Status Normal  Does patient need assistance? Functional Status Self care Ambulation Normal      Chief Complaint:  pt c/o cough and "pressure" in his face.  History of Present Illness: [name] is a 55 y/o man with PMH sig. for IDDM type2, HTN CAD, dylipidemia who comes today for an acute visit. He has been having dry cough for about 3 weeks. The cough has been getting a little bit better, however it still persists. Sometimes he has some wheezing associated with it. He denies fever chills, chest pain or SOB. He also ststed he has not been checking his sugars for about 3 weeks as he ran out of strips. He will get them approx. next week.  Prior Medications: LANTUS 100 UNIT/ML SOLN (INSULIN GLARGINE) 40 units at bedtime, to self titrate NOVOLOG FLEXPEN 100 UNIT/ML SOLN (INSULIN ASPART) 15units with meals two times a day PLAVIX 75 MG TABS (CLOPIDOGREL BISULFATE) Take 1 tablet by mouth once a day NIASPAN 500 MG TBCR (NIACIN (ANTIHYPERLIPIDEMIC)) Take 2 tablets by mouth once a day ACCUPRIL 20 MG TABS (QUINAPRIL HCL) once daily GLUCOPHAGE 1000 MG TABS (METFORMIN HCL) Take 1 tablet by mouth two times a day VIAGRA 100 MG TABS (SILDENAFIL CITRATE) as needed ZYRTEC 10 MG TABS (CETIRIZINE HCL) Take 1 tablet by mouth once a day FLONASE 50 MCG/ACT SUSP (FLUTICASONE PROPIONATE) two puffs daily Current Allergies (reviewed today): No known allergies     Risk Factors:  Tobacco use:  quit    Year quit:  33 yrs ago   Review of Systems  The patient denies fever, chills, sweats, chest pain, dyspnea on exertion, dyspnea at rest, and hemoptysis.     Physical  Exam  General:     alert and overweight-appearing.   Head:     normocephalic.   Ears:     External ear exam shows no significant lesions or deformities.  Otoscopic examination reveals clear canals, tympanic membranes are intact bilaterally without bulging, retraction, inflammation or discharge. Hearing is grossly normal bilaterally. Mouth:     Oral mucosa and oropharynx without lesions or exudates.   Lungs:     normal respiratory effort, normal breath sounds, no crackles, and no wheezes.   Heart:     normal rate, regular rhythm, no murmur, no gallop, and no rub.   Extremities:     No edema    Impression & Recommendations:  Problem # 1:  BRONCHITIS-ACUTE (ICD-466.0) It seems this is viral in nature due the history and the physical exam, and also it seems to be resolving on its own. There is some GERD component to it , but this not very clear. So for now I will treat as follows  Tussionex 5 ml. by mouth two times a day for 5 to 7 days. Albuterol 2 puffs two times a day to three times a day. If cough persists after 2 weeks or he develops fever, chest pain, SOB he is to call.  His updated medication list for this problem includes:  Albuterol 90 Mcg/act Aers (Albuterol) ..... Inhale 2 puffs two to three times a day    Tussionex Pennkinetic Er 8-10 Mg/65ml Lqcr (Chlorpheniramine-hydrocodone) .Marland Kitchen... Take 5 ml. by mouth twice a day   Problem # 2:  ERECTILE DYSFUNCTION, ORGANIC (ICD-607.84) This issue is beeing worked up by Dr. Sherlon Handing. Testosterone level was low, so Dr. Sherlon Handing wants to check FHS, LH level. I will schedule those tests. His updated medication list for this problem includes:    Viagra 100 Mg Tabs (Sildenafil citrate) .Marland Kitchen... As needed  Future Orders: T-FSH ([id number]) ... 09/09/2006 T-LH ([id number]) ... 09/09/2006   Problem # 3:  DIABETES MELLITUS, TYPE II (ICD-250.00) He continues to use his meds. As I stated before hi is not checking his sugars due to lack of  strips. So he is using novolog 2 times a day. The good thing is his A1c is better. Today it was 7.7.  Plan:  continue current dose and titrate when he gets his strips.  His updated medication list for this problem includes:    Lantus 100 Unit/ml Soln (Insulin glargine) .Marland KitchenMarland KitchenMarland KitchenMarland Kitchen 40 units at bedtime, to self titrate    Novolog Flexpen 100 Unit/ml Soln (Insulin aspart) .Marland KitchenMarland KitchenMarland KitchenMarland Kitchen 15units with meals two times a day    Accupril 20 Mg Tabs (Quinapril hcl) ..... Once daily    Glucophage 1000 Mg Tabs (Metformin hcl) .Marland Kitchen... Take 1 tablet by mouth two times a day  Orders: T-Hgb A1C ([id number]) T- Capillary Blood Glucose (31517)   Problem # 4:  HYPERLIPIDEMIA, MIXED (ICD-272.2) [name] told he started recently  to take his niacin as Dr. Algie Coffer prescribed. He needs lipids checked. I will schedule it  His updated medication list for this problem includes:    Niaspan 500 Mg Tbcr (Niacin (antihyperlipidemic)) .Marland Kitchen... Take 2 tablets by mouth once a day  Future Orders: T-Lipid Profile ([id number]) ... 09/09/2006 T-Comprehensive Metabolic Panel [phone]) ... 09/09/2006   Problem # 5:  HYPERTENSION (ICD-401.9) Out of goal for DM. However patient didn't bring his meds. According to Dr. Sherlon Handing' note the patient is supposed to be on accupril, but the patient stated he is taking lisinopril. He needs to bring his medications next visit, then  this problem will be adressed  His updated medication list for this problem includes:    Accupril 20 Mg Tabs (Quinapril hcl) ..... Once daily   Medications Added to Medication List This Visit: 1)  Niaspan 500 Mg Tbcr (Niacin (antihyperlipidemic)) .... Take 2 tablets by mouth once a day 2)  Albuterol 90 Mcg/act Aers (Albuterol) .... Inhale 2 puffs two to three times a day 3)  Tussionex Pennkinetic Er 8-10 Mg/10ml Lqcr (Chlorpheniramine-hydrocodone) .... Take 5 ml. by mouth twice a day   Patient Instructions: 1)  Please schedule a follow-up appointment in 1 month  for Dr. Sherlon Handing  Laboratory Results       Blood Tests Glucose (random): 128 mg/dL   (Normal Range: 03-500) HGBA1C: 7.7%   (Normal Range: Non-Diabetic - 3-6%   Control Diabetic - 6-8%)   Other Tests

## 2010-09-18 NOTE — Assessment & Plan Note (Signed)
Summary: F/U/EST/VS   Vital Signs:  Patient profile:   55 year old male Height:      72 inches (182.88 cm) Weight:      231.6 pounds (105.27 kg) BMI:     31.52 Temp:     97.1 degrees F (36.17 degrees C) oral Pulse rate:   85 / minute BP sitting:   129 / 74  (left arm)  Vitals Entered By: Stanton Kidney Ditzler RN (November 28, 2009 3:46 PM) Is Patient Diabetic? Yes Did you bring your meter with you today? No Pain Assessment Patient in pain? yes     Location: right shoulder Intensity: 6-7 Type: aching Onset of pain  long time Nutritional Status BMI of > 30 = obese Nutritional Status Detail appetite better  Have you ever been in a relationship where you felt threatened, hurt or afraid?denies   Does patient need assistance? Functional Status Self care Ambulation Normal Comments CBG this AM 113. FU - doing better.   Primary Care Provider:  Jason Coop MD   History of Present Illness: Tyler Wilkinson is a 55 yo man with PMH as outlined in the EMR comes today for a f/u visit.   1. OSA: he is doing better with mask. He feels like he is now a different person and his memory has improved.   2. DM: It is  doing better and he forgot to bring his meter. In general, it is running mostly in 150-170s, but he does have readings in 3-400s occasionally. He had 2-3 readings in 60's. he had jitteriness, sweating and shaking like symptoms once only.   3. Rotator cuff: It is getting better but it still hurts. He takes vicodin for pain.   4. HL: He is taking his statins without any problem.   5. HTN: He tolerates his medications well.   Depression History:      The patient denies a depressed mood most of the day and a diminished interest in his usual daily activities.         Preventive Screening-Counseling & Management  Alcohol-Tobacco     Smoking Status: quit     Year Quit: only at age 69     Passive Smoke Exposure: no  Caffeine-Diet-Exercise     Does Patient Exercise: yes  Type of exercise: walking     Times/week: 2  Current Medications (verified): 1)  Plavix 75 Mg Tabs (Clopidogrel Bisulfate) .... Take 1 Tablet By Mouth Once A Day 2)  Niaspan 1000 Mg Cr-Tabs (Niacin (Antihyperlipidemic)) .... Take 2 Tablet By Mouth Once A Day 3)  Accupril 40 Mg Tabs (Quinapril Hcl) .... Take 1 Tablet By Mouth Once A Day For Blood Pressure 4)  Flax Seed Oil 1000 Mg Caps (Flaxseed (Linseed)) .... Take 1 Capsule By Mouth Two Times A Day 5)  Aspir-Low 81 Mg Tbec (Aspirin) .... Take 1 Tablet By Mouth Two Times A Day 6)  Coreg 6.25 Mg Tabs (Carvedilol) .... Take 1 Tablet By Mouth Two Times A Day 7)  Albuterol 90 Mcg/act  Aers (Albuterol) .... Take 2 Puffs As Needed For Respiratory Difficulty Up To Every 3 Hourly. 8)  Hydrochlorothiazide 25 Mg Tabs (Hydrochlorothiazide) .... Take 1 Tablet By Mouth Once A Day 9)  Sudafed Pe Sinus/allergy 4-10 Mg  Tabs (Chlorpheniramine-Phenylephrine) .... Take 1 Tablet By Mouth Two Times A Day 10)  Nasonex 50 Mcg/act  Susp (Mometasone Furoate) .... Apply On Spray On Each Nostril Daily. 11)  Novolin R 100 Unit/ml Soln (Insulin Regular Human) .Marland KitchenMarland KitchenMarland Kitchen  Inject 30 Units Subcutaneously With Each Meal 12)  Insulin Syringe 30g X 5/16" 1 Ml Misc (Insulin Syringe-Needle U-100) .... Use To Inject Insulin 13)  Truetrack Test  Strp (Glucose Blood) .... Use To Test Blood Glucsoe 3x Daily 14)  Lancets Thin  Misc (Lancets) .... Use To Test Blood Glucose 3x Daily 15)  Singulair 10 Mg Tabs (Montelukast Sodium) .... Take 1 Tablet By Mouth Once A Day 16)  Lantus 100 Unit/ml Soln (Insulin Glargine) .... Take 50 Units Daily. 17)  Xyzal 5 Mg Tabs (Levocetirizine Dihydrochloride) .... Take 1 Pill By Mouth Daily As Needed For Allergy 18)  Crestor 5 Mg Tabs (Rosuvastatin Calcium) .... Take 1 Pill By Mouth Daily. 19)  Prandin 0.5 Mg Tabs (Repaglinide) .... Take One Tablet 15 Minutes Before Meal If You Can Not Take Insulin. 20)  Vicodin 5-500 Mg Tabs (Hydrocodone-Acetaminophen) ....  Take 1 Pill By Mouth Three Times A Day As Needed For Pain. 21)  Cialis 20 Mg Tabs (Tadalafil) .... Take One Pill Daily As Needed 30 Minutes Before Sexual Activity. 22)  Neurontin 300 Mg Caps (Gabapentin) .... Take 1 Capsule At Bedtime Daily.  Allergies: No Known Drug Allergies  Review of Systems      See HPI  Physical Exam  General:  alert.   Mouth:  pharynx pink and moist.   Lungs:  normal breath sounds, no crackles, and no wheezes.   Heart:  normal rate, regular rhythm, no murmur, no gallop, and no rub.   Abdomen:  soft, non-tender, and normal bowel sounds.   Extremities:  trace left pedal edema and trace right pedal edema.   Neurologic:  alert & oriented X3.     Impression & Recommendations:  Problem # 1:  ROTATOR CUFF SYNDROME, RIGHT (ICD-726.10) Strongly encouraged to go to sports medicine as he may benefit from another steorid injection. I also refilled his vicodin.   Problem # 2:  SLEEP APNEA (ICD-780.57) Pt has seen symptomatic improvement with CPAP. Encourage him to try different masks and different positions so that his sleep doesn't get interrupted. If he has persistent interrupted sleep despite this, will need w/u for sleep disorder other than OSA.   Problem # 3:  HYPERLIPIDEMIA, MIXED (ICD-272.2) Cont same.  His updated medication list for this problem includes:    Niaspan 1000 Mg Cr-tabs (Niacin (antihyperlipidemic)) .Marland Kitchen... Take 2 tablet by mouth once a day    Crestor 5 Mg Tabs (Rosuvastatin calcium) .Marland Kitchen... Take 1 pill by mouth daily.  His updated medication list for this problem includes:    Niaspan 1000 Mg Cr-tabs (Niacin (antihyperlipidemic)) .Marland Kitchen... Take 2 tablet by mouth once a day    Crestor 5 Mg Tabs (Rosuvastatin calcium) .Marland Kitchen... Take 1 pill by mouth daily.  Problem # 4:  HYPERTENSION (ICD-401.9) BP great, cont same.  His updated medication list for this problem includes:    Accupril 40 Mg Tabs (Quinapril hcl) .Marland Kitchen... Take 1 tablet by mouth once a day for blood  pressure    Coreg 6.25 Mg Tabs (Carvedilol) .Marland Kitchen... Take 1 tablet by mouth two times a day    Hydrochlorothiazide 25 Mg Tabs (Hydrochlorothiazide) .Marland Kitchen... Take 1 tablet by mouth once a day  His updated medication list for this problem includes:    Accupril 40 Mg Tabs (Quinapril hcl) .Marland Kitchen... Take 1 tablet by mouth once a day for blood pressure    Coreg 6.25 Mg Tabs (Carvedilol) .Marland Kitchen... Take 1 tablet by mouth two times a day    Hydrochlorothiazide 25 Mg  Tabs (Hydrochlorothiazide) .Marland Kitchen... Take 1 tablet by mouth once a day  Problem # 5:  DIABETES MELLITUS, TYPE II (ICD-250.00) Pt didnot bring his meter, but says his CBGs are generally in 150-170, which is a significant improvement for hiim. He had 1 symptomatic hypoglycemia when his CBg was in 57s with current regimen. He wants to keep his regimen continued and I warned him about symptoms of low blood sugar and to eat something quickly. Enouraged to bring his meter. Reviewed notes from his optho.   His updated medication list for this problem includes:    Accupril 40 Mg Tabs (Quinapril hcl) .Marland Kitchen... Take 1 tablet by mouth once a day for blood pressure    Aspir-low 81 Mg Tbec (Aspirin) .Marland Kitchen... Take 1 tablet by mouth two times a day    Novolin R 100 Unit/ml Soln (Insulin regular human) ..... Inject 30 units subcutaneously with each meal    Lantus 100 Unit/ml Soln (Insulin glargine) .Marland Kitchen... Take 50 units daily.    Prandin 0.5 Mg Tabs (Repaglinide) .Marland Kitchen... Take one tablet 15 minutes before meal if you can not take insulin.  His updated medication list for this problem includes:    Accupril 40 Mg Tabs (Quinapril hcl) .Marland Kitchen... Take 1 tablet by mouth once a day for blood pressure    Aspir-low 81 Mg Tbec (Aspirin) .Marland Kitchen... Take 1 tablet by mouth two times a day    Novolin R 100 Unit/ml Soln (Insulin regular human) ..... Inject 30 units subcutaneously with each meal    Lantus 100 Unit/ml Soln (Insulin glargine) .Marland Kitchen... Take 50 units daily.    Prandin 0.5 Mg Tabs (Repaglinide) .Marland Kitchen...  Take one tablet 15 minutes before meal if you can not take insulin.  Complete Medication List: 1)  Plavix 75 Mg Tabs (Clopidogrel bisulfate) .... Take 1 tablet by mouth once a day 2)  Niaspan 1000 Mg Cr-tabs (Niacin (antihyperlipidemic)) .... Take 2 tablet by mouth once a day 3)  Accupril 40 Mg Tabs (Quinapril hcl) .... Take 1 tablet by mouth once a day for blood pressure 4)  Flax Seed Oil 1000 Mg Caps (Flaxseed (linseed)) .... Take 1 capsule by mouth two times a day 5)  Aspir-low 81 Mg Tbec (Aspirin) .... Take 1 tablet by mouth two times a day 6)  Coreg 6.25 Mg Tabs (Carvedilol) .... Take 1 tablet by mouth two times a day 7)  Albuterol 90 Mcg/act Aers (Albuterol) .... Take 2 puffs as needed for respiratory difficulty up to every 3 hourly. 8)  Hydrochlorothiazide 25 Mg Tabs (Hydrochlorothiazide) .... Take 1 tablet by mouth once a day 9)  Sudafed Pe Sinus/allergy 4-10 Mg Tabs (Chlorpheniramine-phenylephrine) .... Take 1 tablet by mouth two times a day 10)  Nasonex 50 Mcg/act Susp (Mometasone furoate) .... Apply on spray on each nostril daily. 11)  Novolin R 100 Unit/ml Soln (Insulin regular human) .... Inject 30 units subcutaneously with each meal 12)  Insulin Syringe 30g X 5/16" 1 Ml Misc (Insulin syringe-needle u-100) .... Use to inject insulin 13)  Truetrack Test Strp (Glucose blood) .... Use to test blood glucsoe 3x daily 14)  Lancets Thin Misc (Lancets) .... Use to test blood glucose 3x daily 15)  Singulair 10 Mg Tabs (Montelukast sodium) .... Take 1 tablet by mouth once a day 16)  Lantus 100 Unit/ml Soln (Insulin glargine) .... Take 50 units daily. 17)  Xyzal 5 Mg Tabs (Levocetirizine dihydrochloride) .... Take 1 pill by mouth daily as needed for allergy 18)  Crestor 5 Mg Tabs (Rosuvastatin  calcium) .... Take 1 pill by mouth daily. 19)  Prandin 0.5 Mg Tabs (Repaglinide) .... Take one tablet 15 minutes before meal if you can not take insulin. 20)  Vicodin 5-500 Mg Tabs  (Hydrocodone-acetaminophen) .... Take 1 pill by mouth three times a day as needed for pain. 21)  Cialis 20 Mg Tabs (Tadalafil) .... Take one pill daily as needed 30 minutes before sexual activity. 22)  Neurontin 300 Mg Caps (Gabapentin) .... Take 1 capsule at bedtime daily.  Patient Instructions: 1)  Please schedule a follow-up appointment in 1 month. 2)  Limit your Sodium (Salt) to less than 2 grams a day(slightly less than 1/2 a teaspoon) to prevent fluid retention, swelling, or worsening of symptoms. 3)  It is important that you exercise regularly at least 20 minutes 5 times a week. If you develop chest pain, have severe difficulty breathing, or feel very tired , stop exercising immediately and seek medical attention. 4)  You need to lose weight. Consider a lower calorie diet and regular exercise.  5)  Check your blood sugars regularly. If your readings are usually above : or below 70 you should contact our office. 6)  It is important that your Diabetic A1c level is checked every 3 months. 7)  See your eye doctor yearly to check for diabetic eye damage. 8)  Check your feet each night for sore areas, calluses or signs of infection. Prescriptions: VICODIN 5-500 MG TABS (HYDROCODONE-ACETAMINOPHEN) take 1 pill by mouth three times a day as needed for pain.  #50 x 0   Entered and Authorized by:   Jason Coop MD   Signed by:   Jason Coop MD on 11/28/2009   Method used:   Print then Give to Patient   RxID:   [id number] HYDROCHLOROTHIAZIDE 25 MG TABS (HYDROCHLOROTHIAZIDE) Take 1 tablet by mouth once a day  #90 x 0   Entered and Authorized by:   Jason Coop MD   Signed by:   Jason Coop MD on 11/28/2009   Method used:   Print then Give to Patient   RxID:   [id number]    Prevention & Chronic Care Immunizations   Influenza vaccine: Fluvax 3+  (05/19/2009)   Influenza vaccine deferral: Deferred  (04/03/2009)    Tetanus booster: Not documented   Td  booster deferral: Deferred  (04/03/2009)    Pneumococcal vaccine: Not documented   Pneumococcal vaccine deferral: Deferred  (04/03/2009)  Colorectal Screening   Hemoccult: Not documented   Hemoccult action/deferral: Deferred  (04/03/2009)    Colonoscopy: Not documented   Colonoscopy action/deferral: Deferred  (04/03/2009)  Other Screening   PSA: Not documented   PSA action/deferral: Discussion deferred  (04/03/2009)   Smoking status: quit  (11/28/2009)  Diabetes Mellitus   HgbA1C: 8.2  (09/28/2009)   HgbA1C action/deferral: Deferred  (04/03/2009)   Hemoglobin A1C due: 09/16/2007    Eye exam: No diabetic retinopathy.   Visual acuity OD (best corrected):     20/25 Visual acuity OS (best corrected):     20/30 Intraocular pressure OD:     22 Intraocular pressure OS:     22   (11/02/2008)   Eye exam due: 11/2009    Foot exam: yes  (09/28/2008)   High risk foot: No  (09/28/2008)   Foot care education: Done  (09/28/2008)   Foot exam due: 12/18/2007    Urine microalbumin/creatinine ratio: 5.0  (05/19/2009)   Urine microalbumin/cr due: 12/21/2007    Diabetes flowsheet reviewed?: Yes   Progress toward  A1C goal: Unchanged  Lipids   Total Cholesterol: 88  (05/19/2009)   Lipid panel action/deferral: Deferred   LDL: 43  (05/19/2009)   LDL Direct: Not documented   HDL: 32  (05/19/2009)   Triglycerides: 66  (05/19/2009)   Lipid panel due: 12/19/2007    SGOT (AST): 35  (09/28/2009)   SGPT (ALT): 56  (09/28/2009)   Alkaline phosphatase: 101  (09/28/2009)   Total bilirubin: 0.5  (09/28/2009)    Lipid flowsheet reviewed?: Yes   Progress toward LDL goal: At goal  Hypertension   Last Blood Pressure: 129 / 74  (11/28/2009)   Serum creatinine: 1.01  (09/28/2009)   BMP action: Deferred   Serum potassium 4.1  (09/28/2009)    Hypertension flowsheet reviewed?: Yes   Progress toward BP goal: At goal  Self-Management Support :   Personal Goals (by the next clinic visit) :      Personal A1C goal: 7  (05/19/2009)     Personal blood pressure goal: 140/90  (05/19/2009)     Personal LDL goal: 70  (05/19/2009)    Patient will work on the following items until the next clinic visit to reach self-care goals:     Medications and monitoring: take my medicines every day, check my blood sugar, bring all of my medications to every visit, examine my feet every day  (11/28/2009)     Eating: drink diet soda or water instead of juice or soda, eat more vegetables, use fresh or frozen vegetables, eat foods that are low in salt, limit or avoid alcohol  (11/28/2009)     Activity: take a 30 minute walk every day, take the stairs instead of the elevator, park at the far end of the parking lot  (11/28/2009)    Diabetes self-management support: Written self-care plan, Education handout, Resources for patients handout  (11/28/2009)   Diabetes care plan printed   Diabetes education handout printed   Last diabetes self-management training by diabetes educator: 03/03/2009    Hypertension self-management support: Written self-care plan, Education handout, Resources for patients handout  (11/28/2009)   Hypertension self-care plan printed.   Hypertension education handout printed    Lipid self-management support: Written self-care plan, Education handout, Resources for patients handout  (11/28/2009)   Lipid self-care plan printed.   Lipid education handout printed      Resource handout printed.

## 2010-09-18 NOTE — Progress Notes (Signed)
Summary: APPT/ hla  Phone Note Call from Patient   Reason for Call: Acute Illness Summary of Call: x2 days, n & v, coughing, short of breath, others in the house have been sick, appt 2pm Initial call taken by: Marin Roberts RN,  November 04, 2007 12:00 PM

## 2010-09-18 NOTE — Progress Notes (Signed)
Summary: refill/gg  Phone Note Refill Request  on October 11, 2009 4:54 PM  Refills Requested: Medication #1:  CIALIS 20 MG TABS take one pill daily as needed 30 minutes before sexual activity. refill is for viagra 100 mg last given 02/23/09   Method Requested: Fax to Local Pharmacy Initial call taken by: Merrie Roof RN,  October 11, 2009 4:55 PM  Follow-up for Phone Call       Follow-up by: Jason Coop MD,  October 12, 2009 8:41 AM    Prescriptions: CIALIS 20 MG TABS (TADALAFIL) take one pill daily as needed 30 minutes before sexual activity.  #20 x 3   Entered and Authorized by:   Jason Coop MD   Signed by:   Jason Coop MD on 10/12/2009   Method used:   Faxed to ...       [facility] Department (retail)       [address] [place], [place]  [address]       Ph: [phone]       Fax: [phone]   RxID:   [id number]

## 2010-09-18 NOTE — Letter (Signed)
Summary: Appointment - Reminder 2  [facility], Main Office  [address]   [place], [place] [address]   Phone: [phone]  Fax: [phone]     Jan 04, 2010 MRN: [id number]   Tyler Wilkinson [address] [place], [place]  [address]   Dear Mr. [name],  Our records indicate that it is time to schedule a follow-up appointment with Dr. Myrtis Ser. It is very important that we reach you to schedule this appointment. We look forward to participating in your health care needs. Please contact us at the number listed above at your earliest convenience to schedule your appointment.  If you are unable to make an appointment at this time, give [place] a call so we can update our records.     Sincerely,    [facility] Scheduling Team

## 2010-09-18 NOTE — Progress Notes (Signed)
Summary: med refill/gp  Phone Note Refill Request Message from:  Fax from Pharmacy on January 23, 2010 2:27 PM  Refills Requested: Medication #1:  CRESTOR 5 MG TABS take 1 pill by mouth daily.   Last Refilled: 11/08/2009 Next appt. June 21,2011.   Method Requested: Fax to Local Pharmacy Initial call taken by: Chinita Pester RN,  January 23, 2010 2:26 PM  Follow-up for Phone Call       Follow-up by: Jason Coop MD,  January 23, 2010 3:01 PM    Prescriptions: CRESTOR 5 MG TABS (ROSUVASTATIN CALCIUM) take 1 pill by mouth daily.  #30 x 0   Entered and Authorized by:   Jason Coop MD   Signed by:   Jason Coop MD on 01/23/2010   Method used:   Faxed to ...       [facility] Department (retail)       [address] [place], [place]  [address]       Ph: [phone]       Fax: [phone]   RxID:   [id number]

## 2010-09-18 NOTE — Assessment & Plan Note (Signed)
Summary: EST-1 MONTH RECHECK/CH   Vital Signs:  Patient profile:   55 year old male Height:      72 inches Weight:      230.0 pounds (104.55 kg) BMI:     31.31 Temp:     97.1 degrees F (36.17 degrees C) oral Pulse rate:   82 / minute BP sitting:   122 / 73  (right arm) Cuff size:   regular  Vitals Entered By: Cynda Familia Duncan Dull) (Jan 05, 2010 3:34 PM) Nutritional Status BMI of > 30 = obese  Have you ever been in a relationship where you felt threatened, hurt or afraid?No   Does patient need assistance? Functional Status Self care Ambulation Normal   Primary Care Provider:  Jason Coop MD   History of Present Illness: Tyler Wilkinson is a 55 yo man with PMH as outlined in the EMR comes today for a f/u visit.   1. DM; He thinks his A1c will probably be about 9, this is because lately his CBG are higher than usual. He is not so active now and also probably not checking his diet. He is checking his CBG two times a day. He did not bring his meter.   2. Sleep apnea: He is doing better with the machine now and he likes the medicine.   3. R. Shoulder pain: It is better now, but he did not show up again with sports medicine. He occasionally takes vicodin.   4. HTN: He is taking his medicine without any problem.   5. HL: He is taking his medicine without any problem.  He went to balance therapy and he like it a lot.     Current Medications (verified): 1)  Plavix 75 Mg Tabs (Clopidogrel Bisulfate) .... Take 1 Tablet By Mouth Once A Day 2)  Niaspan 1000 Mg Cr-Tabs (Niacin (Antihyperlipidemic)) .... Take 2 Tablet By Mouth Once A Day 3)  Accupril 40 Mg Tabs (Quinapril Hcl) .... Take 1 Tablet By Mouth Once A Day For Blood Pressure 4)  Flax Seed Oil 1000 Mg Caps (Flaxseed (Linseed)) .... Take 1 Capsule By Mouth Two Times A Day 5)  Aspir-Low 81 Mg Tbec (Aspirin) .... Take 1 Tablet By Mouth Two Times A Day 6)  Coreg 6.25 Mg Tabs (Carvedilol) .... Take 1 Tablet By Mouth Two Times  A Day 7)  Albuterol 90 Mcg/act  Aers (Albuterol) .... Take 2 Puffs As Needed For Respiratory Difficulty Up To Every 3 Hourly. 8)  Hydrochlorothiazide 25 Mg Tabs (Hydrochlorothiazide) .... Take 1 Tablet By Mouth Once A Day 9)  Sudafed Pe Sinus/allergy 4-10 Mg  Tabs (Chlorpheniramine-Phenylephrine) .... Take 1 Tablet By Mouth Two Times A Day 10)  Nasonex 50 Mcg/act  Susp (Mometasone Furoate) .... Apply On Spray On Each Nostril Daily. 11)  Novolin R 100 Unit/ml Soln (Insulin Regular Human) .... Inject 30 Units Subcutaneously With Each Meal 12)  Insulin Syringe 30g X 5/16" 1 Ml Misc (Insulin Syringe-Needle U-100) .... Use To Inject Insulin 13)  Truetrack Test  Strp (Glucose Blood) .... Use To Test Blood Glucsoe 3x Daily 14)  Lancets Thin  Misc (Lancets) .... Use To Test Blood Glucose 3x Daily 15)  Singulair 10 Mg Tabs (Montelukast Sodium) .... Take 1 Tablet By Mouth Once A Day 16)  Lantus 100 Unit/ml Soln (Insulin Glargine) .... Take 50 Units Daily. 17)  Xyzal 5 Mg Tabs (Levocetirizine Dihydrochloride) .... Take 1 Pill By Mouth Daily As Needed For Allergy 18)  Crestor 5 Mg Tabs (Rosuvastatin  Calcium) .... Take 1 Pill By Mouth Daily. 19)  Prandin 0.5 Mg Tabs (Repaglinide) .... Take One Tablet 15 Minutes Before Meal If You Can Not Take Insulin. 20)  Vicodin 5-500 Mg Tabs (Hydrocodone-Acetaminophen) .... Take 1 Pill By Mouth Three Times A Day As Needed For Pain. 21)  Viagra 100 Mg Tabs (Sildenafil Citrate) .... Take 1/2 Pill By Mouth 1 Hour Before Sexual Activity, No More Than Once Daily. 22)  Neurontin 300 Mg Caps (Gabapentin) .... Take 1 Capsule At Bedtime Daily.  Allergies: No Known Drug Allergies  Review of Systems      See HPI  Physical Exam  Mouth:  good dentition.   Lungs:  normal breath sounds, no crackles, and no wheezes.   Heart:  normal rate, regular rhythm, no murmur, and no gallop.   Extremities:  trace left pedal edema and trace right pedal edema.   Neurologic:  alert & oriented  X3.     Impression & Recommendations:  Problem # 1:  ROTATOR CUFF SYNDROME, RIGHT (ICD-726.10) It is improved and stable. Will refer to sports medicine as needed.   Problem # 2:  SLEEP APNEA (ICD-780.57) Tolerating his medicine well and really likes it. Cont same.   Problem # 3:  ERECTILE DYSFUNCTION, ORGANIC (ICD-607.84) He wants viagra, instead of cialis. Will replace and prescribe.  His updated medication list for this problem includes:    Viagra 100 Mg Tabs (Sildenafil citrate) .Marland Kitchen... Take 1/2 pill by mouth 1 hour before sexual activity, no more than once daily.  Problem # 4:  HYPERLIPIDEMIA, MIXED (ICD-272.2) Lipid panel shows his LDL and HDL within goal. Cont same.  His updated medication list for this problem includes:    Niaspan 1000 Mg Cr-tabs (Niacin (antihyperlipidemic)) .Marland Kitchen... Take 2 tablet by mouth once a day    Crestor 5 Mg Tabs (Rosuvastatin calcium) .Marland Kitchen... Take 1 pill by mouth daily.  Orders: T-Lipid Profile [id number])  Labs Reviewed: SGOT: 35 (09/28/2009)   SGPT: 56 (09/28/2009)   HDL:32 (05/19/2009), 42 (11/15/2008)  LDL:43 (05/19/2009), 83 (11/15/2008)  Chol:88 (05/19/2009), 140 (11/15/2008)  Trig:66 (05/19/2009), 77 (11/15/2008)  Problem # 5:  HYPERTENSION (ICD-401.9) BP great, will cont same and check following.  His updated medication list for this problem includes:    Accupril 40 Mg Tabs (Quinapril hcl) .Marland Kitchen... Take 1 tablet by mouth once a day for blood pressure    Coreg 6.25 Mg Tabs (Carvedilol) .Marland Kitchen... Take 1 tablet by mouth two times a day    Hydrochlorothiazide 25 Mg Tabs (Hydrochlorothiazide) .Marland Kitchen... Take 1 tablet by mouth once a day  Orders: T-Comprehensive Metabolic Panel [phone]) T-Lipid Profile ([id number])  BP today: 122/73 Prior BP: 129/74 (11/28/2009)  Labs Reviewed: K+: 4.1 (09/28/2009) Creat: : 1.01 (09/28/2009)   Chol: 88 (05/19/2009)   HDL: 32 (05/19/2009)   LDL: 43 (05/19/2009)   TG: 66 (05/19/2009)  Problem # 6:  DIABETES  MELLITUS, TYPE II (ICD-250.00) Pt didnot bring his meter today but states lately his CBG are higher than usual. In addition, he says he is not so strict with his diet and is also not active. HbA1c is 8.5. Plan is to continue same medications and asked him to check his diet more strictly and be active and f/u soon. He states, he is looking forward to change a job and with this his schedule will allow him to remain active.  His updated medication list for this problem includes:    Accupril 40 Mg Tabs (Quinapril hcl) .Marland Kitchen... Take 1 tablet by mouth  once a day for blood pressure    Aspir-low 81 Mg Tbec (Aspirin) .Marland Kitchen... Take 1 tablet by mouth two times a day    Novolin R 100 Unit/ml Soln (Insulin regular human) ..... Inject 30 units subcutaneously with each meal    Lantus 100 Unit/ml Soln (Insulin glargine) .Marland Kitchen... Take 50 units daily.    Prandin 0.5 Mg Tabs (Repaglinide) .Marland Kitchen... Take one tablet 15 minutes before meal if you can not take insulin.  Orders: T-Hgb A1C (in-house) [phone]) T- Capillary Blood Glucose ([id number]) T-Comprehensive Metabolic Panel ([id number]) T-CBC No Diff ([id number]) T-Lipid Profile ([id number]) T-Urine Microalbumin w/creat. ratio [id number])  Labs Reviewed: Creat: 1.01 (09/28/2009)     Last Eye Exam: No diabetic retinopathy.   Cataract.    (10/27/2009) Reviewed HgBA1c results: 8.5 (01/05/2010)  8.2 (09/28/2009)  Complete Medication List: 1)  Plavix 75 Mg Tabs (Clopidogrel bisulfate) .... Take 1 tablet by mouth once a day 2)  Niaspan 1000 Mg Cr-tabs (Niacin (antihyperlipidemic)) .... Take 2 tablet by mouth once a day 3)  Accupril 40 Mg Tabs (Quinapril hcl) .... Take 1 tablet by mouth once a day for blood pressure 4)  Flax Seed Oil 1000 Mg Caps (Flaxseed (linseed)) .... Take 1 capsule by mouth two times a day 5)  Aspir-low 81 Mg Tbec (Aspirin) .... Take 1 tablet by mouth two times a day 6)  Coreg 6.25 Mg Tabs (Carvedilol) .... Take 1 tablet by mouth two times a  day 7)  Albuterol 90 Mcg/act Aers (Albuterol) .... Take 2 puffs as needed for respiratory difficulty up to every 3 hourly. 8)  Hydrochlorothiazide 25 Mg Tabs (Hydrochlorothiazide) .... Take 1 tablet by mouth once a day 9)  Sudafed Pe Sinus/allergy 4-10 Mg Tabs (Chlorpheniramine-phenylephrine) .... Take 1 tablet by mouth two times a day 10)  Nasonex 50 Mcg/act Susp (Mometasone furoate) .... Apply on spray on each nostril daily. 11)  Novolin R 100 Unit/ml Soln (Insulin regular human) .... Inject 30 units subcutaneously with each meal 12)  Insulin Syringe 30g X 5/16" 1 Ml Misc (Insulin syringe-needle u-100) .... Use to inject insulin 13)  Truetrack Test Strp (Glucose blood) .... Use to test blood glucsoe 3x daily 14)  Lancets Thin Misc (Lancets) .... Use to test blood glucose 3x daily 15)  Singulair 10 Mg Tabs (Montelukast sodium) .... Take 1 tablet by mouth once a day 16)  Lantus 100 Unit/ml Soln (Insulin glargine) .... Take 50 units daily. 17)  Xyzal 5 Mg Tabs (Levocetirizine dihydrochloride) .... Take 1 pill by mouth daily as needed for allergy 18)  Crestor 5 Mg Tabs (Rosuvastatin calcium) .... Take 1 pill by mouth daily. 19)  Prandin 0.5 Mg Tabs (Repaglinide) .... Take one tablet 15 minutes before meal if you can not take insulin. 20)  Vicodin 5-500 Mg Tabs (Hydrocodone-acetaminophen) .... Take 1 pill by mouth three times a day as needed for pain. 21)  Viagra 100 Mg Tabs (Sildenafil citrate) .... Take 1/2 pill by mouth 1 hour before sexual activity, no more than once daily. 22)  Neurontin 300 Mg Caps (Gabapentin) .... Take 1 capsule at bedtime daily.  Other Orders: T-Drug Screen-Urine, (single) [id number])  Patient Instructions: 1)  Please schedule a follow-up appointment in 1 month. 2)  It is important that you exercise regularly at least 20 minutes 5 times a week. If you develop chest pain, have severe difficulty breathing, or feel very tired , stop exercising immediately and seek medical  attention. 3)  You need to lose weight. Consider a lower  calorie diet and regular exercise.  4)  Check your blood sugars regularly. If your readings are usually above : or below 70 you should contact our office. 5)  It is important that your Diabetic A1c level is checked every 3 months. 6)  See your eye doctor yearly to check for diabetic eye damage. 7)  Check your feet each night for sore areas, calluses or signs of infection. 8)  Check your Blood Pressure regularly. If it is above: you should make an appointment. Prescriptions: VIAGRA 100 MG TABS (SILDENAFIL CITRATE) take 1/2 pill by mouth 1 hour before sexual activity, no more than once daily.  #30 x 0   Entered and Authorized by:   Jason Coop MD   Signed by:   Jason Coop MD on 01/05/2010   Method used:   Faxed to ...       [facility] Department (retail)       [address] [place], [place]  [address]       Ph: [phone]       Fax: [phone]   RxID:   [id number]   Prevention & Chronic Care Immunizations   Influenza vaccine: Fluvax 3+  (05/19/2009)   Influenza vaccine deferral: Deferred  (04/03/2009)    Tetanus booster: Not documented   Td booster deferral: Deferred  (04/03/2009)    Pneumococcal vaccine: Not documented   Pneumococcal vaccine deferral: Deferred  (04/03/2009)  Colorectal Screening   Hemoccult: Not documented   Hemoccult action/deferral: Deferred  (04/03/2009)    Colonoscopy: Not documented   Colonoscopy action/deferral: Deferred  (04/03/2009)  Other Screening   PSA: Not documented   PSA action/deferral: Discussion deferred  (04/03/2009)   Smoking status: quit  (11/28/2009)  Diabetes Mellitus   HgbA1C: 8.5  (01/05/2010)   HgbA1C action/deferral: Deferred  (04/03/2009)   Hemoglobin A1C due: 09/16/2007    Eye exam: No diabetic retinopathy.   Cataract.     (10/27/2009)   Eye exam due: 04/2010    Foot exam: yes  (09/28/2008)   High risk foot: No   (09/28/2008)   Foot care education: Done  (09/28/2008)   Foot exam due: 12/18/2007    Urine microalbumin/creatinine ratio: 5.0  (05/19/2009)   Urine microalbumin/cr due: 12/21/2007    Diabetes flowsheet reviewed?: Yes   Progress toward A1C goal: Unchanged  Lipids   Total Cholesterol: 88  (05/19/2009)   Lipid panel action/deferral: Deferred   LDL: 43  (05/19/2009)   LDL Direct: Not documented   HDL: 32  (05/19/2009)   Triglycerides: 66  (05/19/2009)   Lipid panel due: 12/19/2007    SGOT (AST): 35  (09/28/2009)   SGPT (ALT): 56  (09/28/2009) CMP ordered    Alkaline phosphatase: 101  (09/28/2009)   Total bilirubin: 0.5  (09/28/2009)    Lipid flowsheet reviewed?: Yes   Progress toward LDL goal: Unchanged  Hypertension   Last Blood Pressure: 122 / 73  (01/05/2010)   Serum creatinine: 1.01  (09/28/2009)   BMP action: Deferred   Serum potassium 4.1  (09/28/2009) CMP ordered     Hypertension flowsheet reviewed?: Yes   Progress toward BP goal: Unchanged  Self-Management Support :   Personal Goals (by the next clinic visit) :     Personal A1C goal: 7  (05/19/2009)     Personal blood pressure goal: 140/90  (05/19/2009)     Personal LDL goal: 70  (05/19/2009)    Diabetes self-management support: Written self-care plan  (01/05/2010)  Diabetes care plan printed   Last diabetes self-management training by diabetes educator: 03/03/2009    Hypertension self-management support: Written self-care plan  (01/05/2010)   Hypertension self-care plan printed.    Lipid self-management support: Written self-care plan  (01/05/2010)   Lipid self-care plan printed.  Laboratory Results   Blood Tests   Date/Time Received: Jan 05, 2010 3:54 PM  Date/Time Reported: Alric Quan  Jan 05, 2010 3:54 PM   HGBA1C: 8.5%   (Normal Range: Non-Diabetic - 3-6%   Control Diabetic - 6-8%) CBG Fasting:: 189mg /dL      Process Orders Check Orders Results:     [facility]: ABN  not required for this insurance Tests Sent for requisitioning (Jan 07, 2010 5:00 PM):     01/05/2010: [facility] -- T-Comprehensive Metabolic Panel [[id number]] (signed)     01/05/2010: [facility] -- T-CBC No Diff [[id number]] (signed)     01/05/2010: [facility] -- T-Lipid Profile [phone] (signed)     01/05/2010: [facility] -- T-Urine Microalbumin w/creat. ratio [[id number]] (signed)     01/05/2010: [facility] -- T-Drug Screen-Urine, (single) [[id number]] (signed)

## 2010-09-18 NOTE — Assessment & Plan Note (Signed)
Summary: DM TRAINING/pt coming 3pm ok per Kiernan Atkerson/VS  Is Patient Diabetic? Yes  CBG Result 129   Current Medications (verified): 1)  Plavix 75 Mg Tabs (Clopidogrel Bisulfate) .... Take 1 Tablet By Mouth Once A Day 2)  Niaspan 1000 Mg Cr-Tabs (Niacin (Antihyperlipidemic)) .... Take 2 Tablet By Mouth Once A Day 3)  Accupril 40 Mg Tabs (Quinapril Hcl) .... Take 1 Tablet By Mouth Once A Day For Blood Pressure 4)  Viagra 100 Mg Tabs (Sildenafil Citrate) .... As Needed 5)  Flax Seed Oil 1000 Mg Caps (Flaxseed (Linseed)) .... Take 1 Capsule By Mouth Two Times A Day 6)  Aspir-Low 81 Mg Tbec (Aspirin) .... Take 1 Tablet By Mouth Two Times A Day 7)  Coreg 6.25 Mg Tabs (Carvedilol) .... Take 1 Tablet By Mouth Two Times A Day 8)  Albuterol 90 Mcg/act  Aers (Albuterol) .... Take 2 Puffs As Needed For Respiratory Difficulty Up To Every 3 Hourly. 9)  Hydrochlorothiazide 25 Mg Tabs (Hydrochlorothiazide) .... Take 1 Tablet By Mouth Once A Day 10)  Sudafed Pe Sinus/allergy 4-10 Mg  Tabs (Chlorpheniramine-Phenylephrine) .... Take 1 Tablet By Mouth Two Times A Day 11)  Nasonex 50 Mcg/act  Susp (Mometasone Furoate) .... Apply On Spray On Each Nostril Daily. 12)  Novolin R 100 Unit/ml Soln (Insulin Regular Human) .... Inject 30-35 Units Subcutaneously With Each Meal 13)  Insulin Syringe 30g X 5/16" 1 Ml Misc (Insulin Syringe-Needle U-100) .... Use To Inject Insulin 14)  Truetrack Test  Strp (Glucose Blood) .... Use To Test Blood Glucsoe 3x Daily 15)  Lancets Thin  Misc (Lancets) .... Use To Test Blood Glucose 3x Daily 16)  Singulair 10 Mg Tabs (Montelukast Sodium) .... Take 1 Tablet By Mouth Once A Day 17)  Lantus 100 Unit/ml Soln (Insulin Glargine) .... Take 50 Units Daily. 18)  Allegra 180 Mg Tabs (Fexofenadine Hcl) .... Take 1 Pill By Mouth Daily As Needed For Allergy 19)  Crestor 5 Mg Tabs (Rosuvastatin Calcium) .... Take 1 Pill By Mouth Daily.  Allergies (verified): No Known Drug Allergies   Complete  Medication List: 1)  Plavix 75 Mg Tabs (Clopidogrel bisulfate) .... Take 1 tablet by mouth once a day 2)  Niaspan 1000 Mg Cr-tabs (Niacin (antihyperlipidemic)) .... Take 2 tablet by mouth once a day 3)  Accupril 40 Mg Tabs (Quinapril hcl) .... Take 1 tablet by mouth once a day for blood pressure 4)  Viagra 100 Mg Tabs (Sildenafil citrate) .... As needed 5)  Flax Seed Oil 1000 Mg Caps (Flaxseed (linseed)) .... Take 1 capsule by mouth two times a day 6)  Aspir-low 81 Mg Tbec (Aspirin) .... Take 1 tablet by mouth two times a day 7)  Coreg 6.25 Mg Tabs (Carvedilol) .... Take 1 tablet by mouth two times a day 8)  Albuterol 90 Mcg/act Aers (Albuterol) .... Take 2 puffs as needed for respiratory difficulty up to every 3 hourly. 9)  Hydrochlorothiazide 25 Mg Tabs (Hydrochlorothiazide) .... Take 1 tablet by mouth once a day 10)  Sudafed Pe Sinus/allergy 4-10 Mg Tabs (Chlorpheniramine-phenylephrine) .... Take 1 tablet by mouth two times a day 11)  Nasonex 50 Mcg/act Susp (Mometasone furoate) .... Apply on spray on each nostril daily. 12)  Novolin R 100 Unit/ml Soln (Insulin regular human) .... Inject 30-35 units subcutaneously with each meal 13)  Insulin Syringe 30g X 5/16" 1 Ml Misc (Insulin syringe-needle u-100) .... Use to inject insulin 14)  Truetrack Test Strp (Glucose blood) .... Use to test blood glucsoe  3x daily 15)  Lancets Thin Misc (Lancets) .... Use to test blood glucose 3x daily 16)  Singulair 10 Mg Tabs (Montelukast sodium) .... Take 1 tablet by mouth once a day 17)  Lantus 100 Unit/ml Soln (Insulin glargine) .... Take 50 units daily. 18)  Allegra 180 Mg Tabs (Fexofenadine hcl) .... Take 1 pill by mouth daily as needed for allergy 19)  Crestor 5 Mg Tabs (Rosuvastatin calcium) .... Take 1 pill by mouth daily.  Other Orders: DSMT(Medicare) Individual, 30 Minutes (G0108)   Diabetes Self Management Training  PCP:  Jason Coop MD Referring MD: [email or web address] Date diagnosed with  diabetes: 08/19/1970 Diabetes Type: Type 2 insulin treated Other persons present: no Current smoking Status: quit  Diabetes Medications:  Comments: did not bring meter- says blood sugar are mostly in 100s. really trying to lower A1C, was happy to hear it was 7.9 and not 9 again. trying to decrease number of injections, tkaing lantus 50 untis  ~ 2-3 pm daily and 20-30 untis regular insulin before work and before sleep when he gets home. reports some hypoglycemia, but is vague about this.reports that he took 30 units today before coming here wiht CBg of 144 and ate only a roast beef sandwich abd diet green tea.   Sliding Scale:suggested guidelines for patient to take regualr insulin more safely: < 200 mg/dl use 10 units and eat a sandwich, > 200 mg/dl 16-10 units and eat a sandwich,  Insulin to Carb Ratio: 1 unit:3g carb Correction Factor: 1 unit:15mg /dl Currently using Insulin Pump? No     Estimated /Usual Carb Intake Breakfast # of Carbs/Grams cereal and milk Lunch # of Carbs/Grams roast beef sandwich  Nutrition assessment ETOH : No What beverages do you drink?  diet green tea and water mostly now, occassional regular soda Do you read food labels? If so what do you look at?  no  Activity Limitations  Appropriate physical activity Would you  say you are physically active: Yes Exercise alone:  Yes  Type of physical activity  Work  Psychosocial Adjustment Identify how stress affects diabetes & two sources of stress: Needs review/assistance Name two ways of obtaining support from family/friends: Demonstrates competency Diabetes Management Education Done: 03/03/2009        Discssed with patient ndphysician possibility of a trial of using low dose prandin at work when he decides to eat more at work ( does not want to take insulin or meter to work with himand does eat at work) . Prandin is to help alleviate patient from feeling th eneed ot take such a large dose of regular to  try to cover what he is [facility] at [facility] before he goes towork and to be lesslieky to have hypoglycemia. C-petide was normal (1.61(0.80 -3.90)  when checked severla years ago  follow-up: 2 -3 months   Last LDL:                                                 83 (11/15/2008 9:21:00 PM)

## 2010-09-18 NOTE — Progress Notes (Signed)
Summary: refill/ hla  Phone Note Refill Request Message from:  Fax from Pharmacy on Jan 09, 2010 3:15 PM  Refills Requested: Medication #1:  NIASPAN 1000 MG CR-TABS Take 2 tablet by mouth once a day   Dosage confirmed as above?Dosage Confirmed   Last Refilled: 2/17  Medication #2:  ALBUTEROL 90 MCG/ACT  AERS take 2 puffs as needed for respiratory difficulty up to every 3 hourly.   Last Refilled: 11/13/2007 Initial call taken by: Marin Roberts RN,  Jan 09, 2010 3:16 PM  Follow-up for Phone Call       Follow-up by: Jason Coop MD,  Jan 10, 2010 12:05 PM    Prescriptions: ALBUTEROL 90 MCG/ACT  AERS (ALBUTEROL) take 2 puffs as needed for respiratory difficulty up to every 3 hourly.  #1 mo supply x 0   Entered and Authorized by:   Jason Coop MD   Signed by:   Jason Coop MD on 01/10/2010   Method used:   Telephoned to ...       [facility] Department (retail)       [address] [place], [place]  [address]       Ph: [phone]       Fax: [phone]   RxID:   [id number] NIASPAN 1000 MG CR-TABS (NIACIN (ANTIHYPERLIPIDEMIC)) Take 2 tablet by mouth once a day  #60 x 0   Entered and Authorized by:   Jason Coop MD   Signed by:   Jason Coop MD on 01/10/2010   Method used:   Telephoned to ...       [facility] Department (retail)       [address] [place], [place]  [address]       Ph: [phone]       Fax: [phone]   RxID:   [id number]

## 2010-09-18 NOTE — Miscellaneous (Signed)
Summary: [facility]   Imported By: Florinda Marker 12/12/2008 14:06:00  _____________________________________________________________________  External Attachment:    Type:   Image     Comment:   External Document

## 2010-09-18 NOTE — Progress Notes (Signed)
Summary: Refill/gh  Phone Note Refill Request Message from:  Pharmacy on January 24, 2009 12:02 PM  Refills Requested: Medication #1:  CRESTOR 5 MG TABS take 1 pill by mouth daily.   Last Refilled: 11/16/2008  Method Requested: Fax to Local Pharmacy Initial call taken by: Angelina Ok RN,  January 24, 2009 12:02 PM  Follow-up for Phone Call       Follow-up by: Jason Coop MD,  January 24, 2009 5:11 PM      Prescriptions: CRESTOR 5 MG TABS (ROSUVASTATIN CALCIUM) take 1 pill by mouth daily.  #30 x 3   Entered and Authorized by:   Jason Coop MD   Signed by:   Jason Coop MD on 01/24/2009   Method used:   Telephoned to ...       [facility] Department (retail)       [address] [place], [place]  [address]       Ph: [phone]       Fax: [phone]   RxID:   [id number]   Appended Document: Refill/gh Prescription refill faxed to [facility]. Angelina Ok, RN January 25, 2009 11:17 AM

## 2010-09-18 NOTE — Progress Notes (Signed)
Summary: diabetes support/dmr  Phone Note Call from Patient Call back at Home Phone [phone]   Caller: Patient Summary of Call: left message for him to call back. Finall  reached him- he didn't get much sleep-changed apppointment form today to tomorrow since he is already seeing doctor tomorrow.  thinks cortisone from shoulder ran blood sugars up and that this affected A1C. feels he is doing a better job at caring for his blood sugars in the past couple of years. woke up with a 60 and was confused and drank unsweetened lemonade. Finally - he realized that he needed sugar and ate some mints. Feels he is needing less insulin Injecting 20 units Regular insulin more often)  now that he is eating lighter and drinking less regualr soda.   Initial call taken by: Jamison Neighbor RD,CDE,  March 02, 2009 11:26 AM

## 2010-09-18 NOTE — Progress Notes (Signed)
Summary: refill/gg  Phone Note Refill Request  on March 13, 2009 3:18 PM  Refills Requested: Medication #1:  PLAVIX 75 MG TABS Take 1 tablet by mouth once a day Pt would like a year refill   Method Requested: Telephone to Pharmacy Initial call taken by: Merrie Roof RN,  March 13, 2009 3:18 PM  Follow-up for Phone Call       Follow-up by: Jason Coop MD,  March 13, 2009 5:39 PM    Prescriptions: PLAVIX 75 MG TABS (CLOPIDOGREL BISULFATE) Take 1 tablet by mouth once a day  #30 x 6   Entered and Authorized by:   Jason Coop MD   Signed by:   Jason Coop MD on 03/13/2009   Method used:   Telephoned to ...       [facility] (retail)       [address]       [place], [place]  [address]       Ph: [phone]       Fax: [phone]   RxID:   [id number]   Appended Document: refill/gg rx called in

## 2010-09-18 NOTE — Assessment & Plan Note (Signed)
Summary: 2WK FU/POKHAREL/VS   Vital Signs:  Patient Profile:   55 Years Old Male Height:     73 inches (185.42 cm) Weight:      223.2 pounds (101.45 kg) BMI:     29.55 Temp:     97.5 degrees F (36.39 degrees C) oral Pulse rate:   76 / minute BP sitting:   134 / 71  (right arm)  Vitals Entered By: Krystal Eaton Duncan Dull) (September 28, 2008 3:56 PM)             Is Patient Diabetic? Yes Did you bring your meter with you today? No Nutritional Status BMI of 25 - 29 = overweight  Have you ever been in a relationship where you felt threatened, hurt or afraid?No   Does patient need assistance? Functional Status Self care Ambulation Normal     PCP:  Tacey Ruiz MD  Chief Complaint:  2wk f/u.  History of Present Illness: 55 year old man with pmhx as described below that comes to the clinic for 2 week follow up to further discuss medical regimen.  Went to see his Cardiologists which told him that it was up to him to decide whether to keep taking plavix. Patient is concerned because he doesn't want to get off plavix and does not want to change from metoprolol to coreg. Although patient states that memory problem is affecting his daily living. Patient can not remember what he saw in the television 30 minutes later. The patient denies fever, chest pain, dyspnea on exertion, prolonged cough, headaches, abdominal pain, melena, hematochezia, severe indigestion/heartburn, muscle weakness, difficulty walking, and abnormal bleeding.      Updated Prior Medication List: PLAVIX 75 MG TABS (CLOPIDOGREL BISULFATE) Take 1 tablet by mouth once a day NIASPAN 1000 MG CR-TABS (NIACIN (ANTIHYPERLIPIDEMIC)) Take 2 tablet by mouth once a day ACCUPRIL 40 MG TABS (QUINAPRIL HCL) Take 1 tablet by mouth once a day for blood pressure VIAGRA 100 MG TABS (SILDENAFIL CITRATE) as needed FLAX SEED OIL 1000 MG CAPS (FLAXSEED (LINSEED)) Take 1 capsule by mouth two times a day FAMOTIDINE 40 MG TABS (FAMOTIDINE)  Take 1 tablet by mouth once a day for upset stomach ASPIR-LOW 81 MG TBEC (ASPIRIN) Take 1 tablet by mouth once a day for circulation COREG 6.25 MG TABS (CARVEDILOL) Take 1 tablet by mouth two times a day ALBUTEROL 90 MCG/ACT  AERS (ALBUTEROL) take 2 puffs as needed for respiratory difficulty up to every 3 hourly. HYDROCHLOROTHIAZIDE 25 MG TABS (HYDROCHLOROTHIAZIDE) Take 1 tablet by mouth once a day SUDAFED PE SINUS/ALLERGY 4-10 MG  TABS (CHLORPHENIRAMINE-PHENYLEPHRINE) Take 1 tablet by mouth two times a day NASONEX 50 MCG/ACT  SUSP (MOMETASONE FUROATE) Apply on spray on each nostril daily. NOVOLIN R 100 UNIT/ML SOLN (INSULIN REGULAR HUMAN) Inject 30-35 units subcutaneously with each meal INSULIN SYRINGE 30G X 5/16" 1 ML MISC (INSULIN SYRINGE-NEEDLE U-100) use to inject insulin TRUETRACK TEST  STRP (GLUCOSE BLOOD) Use to test blood glucsoe 3x daily LANCETS THIN  MISC (LANCETS) use to test blood glucose 3x daily SINGULAIR 10 MG TABS (MONTELUKAST SODIUM) Take 1 tablet by mouth once a day LANTUS 100 UNIT/ML SOLN (INSULIN GLARGINE) take 50 units daily. ALLEGRA 180 MG TABS (FEXOFENADINE HCL) take 1 pill by mouth daily as needed for allergy  Current Allergies (reviewed today): No known allergies   Past Medical History:    Reviewed history from 03/17/2008 and no changes required:       Diabetes mellitus, type II  Hypertension       Allergic rhinitis       ED       Bronchitis       Hyperlipidemia       hx/o MI, inferior wall s/p stent in 2006  Past Surgical History:    Reviewed history from 12/19/2006 and no changes required:       PTCA/stent to RCA   Social History:    Reviewed history from 03/17/2008 and no changes required:       Single       Works as a Printmaker marijuana   Risk Factors: Tobacco use:  quit    Year quit:  only at age 47 Passive smoke exposure:  no Drug use:  yes    Substance:  marijuana Alcohol use:  no Exercise:  yes    Times  per week:  2    Type:  walking Seatbelt use:  100 %   Review of Systems  The patient denies fever, chest pain, dyspnea on exertion, prolonged cough, headaches, abdominal pain, melena, hematochezia, severe indigestion/heartburn, muscle weakness, difficulty walking, and abnormal bleeding.     Physical Exam  General:     NAD Head:     normocephalic and atraumatic.   Eyes:     EOMI, pupils equal, pupils round, pupils reactive to light, pupils react to accomodation, and no injection.   Mouth:     pharynx pink and moist.   Lungs:     Normal respiratory effort, chest expands symmetrically. Lungs are clear to auscultation, no crackles or wheezes. Heart:     Normal rate and regular rhythm. S1 and S2 normal without gallop, murmur, click, rub or other extra sounds. Abdomen:     soft, non-tender, and normal bowel sounds.   Msk:     normal ROM.   Pulses:     2+ pulses dp/pt bilaterally Extremities:     no edema or cyanosis Neurologic:     alert & oriented X3.    Diabetes Management Exam:    Foot Exam (with socks and/or shoes not present):       Sensory-Monofilament:          Left foot: normal          Right foot: normal    Impression & Recommendations:  Problem # 1:  HYPERTENSION (ICD-401.9) Patient agreed to stop taking Metoprolol and start Coreg due to significant memory loss. On subsequent visits will further assess the need to increase coreg.   His updated medication list for this problem includes:    Accupril 40 Mg Tabs (Quinapril hcl) .Marland Kitchen... Take 1 tablet by mouth once a day for blood pressure    Coreg 6.25 Mg Tabs (Carvedilol) .Marland Kitchen... Take 1 tablet by mouth two times a day    Hydrochlorothiazide 25 Mg Tabs (Hydrochlorothiazide) .Marland Kitchen... Take 1 tablet by mouth once a day  BP today: 134/71 Prior BP: 109/65 (09/14/2008)  Labs Reviewed: Creat: 1.12 (09/14/2008) Chol: 151 (04/08/2008)   HDL: 36 (04/08/2008)   LDL: 91 (04/08/2008)   TG: 122 (04/08/2008)   Problem # 2:   HYPERLIPIDEMIA, MIXED (ICD-272.2) Due to increased liver enzymes, although not 3x the normal. Will hold on starting crestor. During the next visit patient is scheduled to get hepatic function test; will also get lipid panel. If patient not at goal will further decide on starting crestor once liver enzymes are reviewed.  The following medications were removed from  the medication list:    Crestor 5 Mg Tabs (Rosuvastatin calcium) .Marland Kitchen... Take 1 pill by mouth daily.  His updated medication list for this problem includes:    Niaspan 1000 Mg Cr-tabs (Niacin (antihyperlipidemic)) .Marland Kitchen... Take 2 tablet by mouth once a day  Future Orders: T-Hepatic Function [id number]) ... 10/26/2008   Problem # 3:  DIABETES MELLITUS, TYPE II (ICD-250.00) Patient not due for A1c today and patient did not bring meter. Patient was told to continue checking blood glucose levels and bring meter during the next visit. Due to compliants of patient having stubbed his toe. A Diabetic foot exam was done which showed that patient had good sensation, no ulcers, skin breaks, blisters, swelling, or redness but due to thick and long nails patient was referred to podiatrist. Patient was uptodate with Eye exam.  His updated medication list for this problem includes:    Accupril 40 Mg Tabs (Quinapril hcl) .Marland Kitchen... Take 1 tablet by mouth once a day for blood pressure    Aspir-low 81 Mg Tbec (Aspirin) .Marland Kitchen... Take 1 tablet by mouth once a day for circulation    Novolin R 100 Unit/ml Soln (Insulin regular human) ..... Inject 30-35 units subcutaneously with each meal    Lantus 100 Unit/ml Soln (Insulin glargine) .Marland Kitchen... Take 50 units daily.  Orders: Podiatry Referral (Podiatry)   Problem # 4:  PERCUTANEOUS TRANSLUMINAL CORONARY ANGIOPLASTY, HX OF (ICD-V45.82) Patient would like to continue taking plavix. Patient is afraid of stoping medication because he feels that he is doing so well on it. Patient also discussed it with his Cardiologist.  Cardiologist states in his letter that he thinks it would be ok for patient to stop medication, but in the future he decides to stop he should continued on aspirin and other aggressive secondary prevention measures.  Will have PCP continue working with patient in regards to continuation of plavix.  His updated medication list for this problem includes:    Plavix 75 Mg Tabs (Clopidogrel bisulfate) .Marland Kitchen... Take 1 tablet by mouth once a day    Accupril 40 Mg Tabs (Quinapril hcl) .Marland Kitchen... Take 1 tablet by mouth once a day for blood pressure    Aspir-low 81 Mg Tbec (Aspirin) .Marland Kitchen... Take 1 tablet by mouth once a day for circulation    Coreg 6.25 Mg Tabs (Carvedilol) .Marland Kitchen... Take 1 tablet by mouth two times a day    Hydrochlorothiazide 25 Mg Tabs (Hydrochlorothiazide) .Marland Kitchen... Take 1 tablet by mouth once a day   Complete Medication List: 1)  Plavix 75 Mg Tabs (Clopidogrel bisulfate) .... Take 1 tablet by mouth once a day 2)  Niaspan 1000 Mg Cr-tabs (Niacin (antihyperlipidemic)) .... Take 2 tablet by mouth once a day 3)  Accupril 40 Mg Tabs (Quinapril hcl) .... Take 1 tablet by mouth once a day for blood pressure 4)  Viagra 100 Mg Tabs (Sildenafil citrate) .... As needed 5)  Flax Seed Oil 1000 Mg Caps (Flaxseed (linseed)) .... Take 1 capsule by mouth two times a day 6)  Famotidine 40 Mg Tabs (Famotidine) .... Take 1 tablet by mouth once a day for upset stomach 7)  Aspir-low 81 Mg Tbec (Aspirin) .... Take 1 tablet by mouth once a day for circulation 8)  Coreg 6.25 Mg Tabs (Carvedilol) .... Take 1 tablet by mouth two times a day 9)  Albuterol 90 Mcg/act Aers (Albuterol) .... Take 2 puffs as needed for respiratory difficulty up to every 3 hourly. 10)  Hydrochlorothiazide 25 Mg Tabs (Hydrochlorothiazide) .... Take 1  tablet by mouth once a day 11)  Sudafed Pe Sinus/allergy 4-10 Mg Tabs (Chlorpheniramine-phenylephrine) .... Take 1 tablet by mouth two times a day 12)  Nasonex 50 Mcg/act Susp (Mometasone furoate) ....  Apply on spray on each nostril daily. 13)  Novolin R 100 Unit/ml Soln (Insulin regular human) .... Inject 30-35 units subcutaneously with each meal 14)  Insulin Syringe 30g X 5/16" 1 Ml Misc (Insulin syringe-needle u-100) .... Use to inject insulin 15)  Truetrack Test Strp (Glucose blood) .... Use to test blood glucsoe 3x daily 16)  Lancets Thin Misc (Lancets) .... Use to test blood glucose 3x daily 17)  Singulair 10 Mg Tabs (Montelukast sodium) .... Take 1 tablet by mouth once a day 18)  Lantus 100 Unit/ml Soln (Insulin glargine) .... Take 50 units daily. 19)  Allegra 180 Mg Tabs (Fexofenadine hcl) .... Take 1 pill by mouth daily as needed for allergy   Patient Instructions: 1)  Please schedule a follow-up appointment in 1 month. 2)  Take all medication as indicated. Do not start Crestor. 3)  Will check Liver enzymes in one month. 4)  Go to Podiatrist appointment.    Last LDL:                                                 91 (04/08/2008 8:20:00 PM)        Diabetic Foot Exam Last Podiatry Exam Date: 12/19/2006 Foot Inspection Is there a history of a foot ulcer?              No Is there a foot ulcer now?              No Can the patient see the bottom of their feet?          Yes Are the shoes appropriate in style and fit?          No Is there swelling or an abnormal foot shape?          No Are the toenails long?                Yes Are the toenails thick?                Yes Are the toenails ingrown?              No Is there heavy callous build-up?              No Is there a claw toe deformity?                          No Is there elevated skin temperature?            No Is there limited ankle dorsiflexion?            No Is there foot or ankle muscle weakness?            No Do you have pain in calf while walking?           No      Diabetic Foot Care Education :Patient educated on appropriate care of diabetic feet.   High Risk Feet? No   10-g (5.07) Semmes-Weinstein  Monofilament Test Performed by: Filomena Jungling NT II          Right Foot  Left Foot Site 1         normal         normal Site 2         normal         normal Site 3         normal         normal Site 4         normal         normal Site 5         normal         normal Site 6         normal         normal Site 7         normal         normal Site 8         normal         normal Site 9         normal         normal Site 10                    normal  Impression      normal         normal

## 2010-09-18 NOTE — Progress Notes (Signed)
Summary: refill/gg  Phone Note Refill Request  on September 07, 2009 4:20 PM  Refills Requested: Medication #1:  CIALIS 10 MG TABS take 1 pill by mouth 30 minutes as needed before sexual activity refill for viagra 100 mg from [facility]   Method Requested: Fax to Local Pharmacy Initial call taken by: Merrie Roof RN,  September 07, 2009 4:20 PM  Follow-up for Phone Call       Follow-up by: Jason Coop MD,  September 07, 2009 9:16 PM    Prescriptions: CIALIS 10 MG TABS (TADALAFIL) take 1 pill by mouth 30 minutes as needed before sexual activity, don't take more than a pill per day.  #30 x 3   Entered and Authorized by:   Jason Coop MD   Signed by:   Jason Coop MD on 09/07/2009   Method used:   Faxed to ...       [facility] (retail)       [address] [place], [place]  [address]       Ph: [phone]       Fax: [phone]   RxID:   [id number]

## 2010-09-18 NOTE — Progress Notes (Signed)
Summary: refill request  Phone Note Refill Request  on November 03, 2007 3:15 PM  Refills Requested: Medication #1:  LANTUS 100 UNIT/ML SOLN Take 60 units at bedtime for diabetes.   Dosage confirmed as above?Dosage Confirmed   Brand Name Necessary? No   Supply Requested: 6 months   Last Refilled: 10/06/2007 Initial call taken by: Concepcion Elk,  November 03, 2007 3:16 PM      Prescriptions: LANTUS 100 UNIT/ML SOLN (INSULIN GLARGINE) Take 60 units at bedtime for diabetes.  #1 month x 6   Entered and Authorized by:   Tacey Ruiz MD   Signed by:   Tacey Ruiz MD on 11/09/2007   Method used:   Print then Give to Patient   RxID:   [id number]

## 2010-09-18 NOTE — Progress Notes (Signed)
Summary: Refill/gh  Phone Note Refill Request Message from:  Patient on May 02, 2010 1:50 PM  Refills Requested: Medication #1:  HYDROCHLOROTHIAZIDE 25 MG TABS Take 1 tablet by mouth once a day Last visit was 01/30/2010.  Last labs were 09/2009.   Method Requested: Electronic Initial call taken by: Angelina Ok RN,  May 02, 2010 1:50 PM  Follow-up for Phone Call        Refilled electronically.  Follow-up by: Margarito Liner MD,  May 02, 2010 2:10 PM    Prescriptions: HYDROCHLOROTHIAZIDE 25 MG TABS (HYDROCHLOROTHIAZIDE) Take 1 tablet by mouth once a day  #90 x 0   Entered and Authorized by:   Margarito Liner MD   Signed by:   Margarito Liner MD on 05/02/2010   Method used:   Electronically to        [facility] (retail)       [address]       [place], [place]  [address]       Ph: [phone]       Fax: [phone]   RxID:   [id number]

## 2010-09-18 NOTE — Progress Notes (Signed)
Summary: Refill/gh  Phone Note Refill Request Message from:  Fax from Pharmacy on November 22, 2009 12:01 PM  Refills Requested: Medication #1:  HYDROCHLOROTHIAZIDE 25 MG TABS Take 1 tablet by mouth once a day   Last Refilled: 10/26/2009  Method Requested: Electronic Initial call taken by: Angelina Ok RN,  November 22, 2009 12:02 PM  Follow-up for Phone Call       Follow-up by: Jason Coop MD,  November 22, 2009 4:11 PM    Prescriptions: HYDROCHLOROTHIAZIDE 25 MG TABS (HYDROCHLOROTHIAZIDE) Take 1 tablet by mouth once a day  #30 x 2   Entered by:   Jason Coop MD   Authorized by:   Marland Kitchen [facility] ATTENDING DESKTOP   Signed by:   Jason Coop MD on 11/22/2009   Method used:   Faxed to ...       [facility] (retail)       [address] [place], [place]  [address]       Ph: [phone]       Fax: [phone]   RxID:   [id number]

## 2010-09-18 NOTE — Assessment & Plan Note (Signed)
Summary: NP W/R SHOULDER PAIN/JW   Vital Signs:  Patient profile:   55 year old male Height:      72 inches Weight:      220 pounds BP sitting:   140 / 70  Vitals Entered By: Lillia Pauls CMA (January 27, 2009 11:22 AM)  Primary Care Provider:  Tacey Ruiz MD  CC:  R shoulder pain.  History of Present Illness: Referred here from outpatient clinic by dr. Aleene Davidson for evaluation of R shoulder pain R hand dominant R lateral shoulder pain since May.  Denies specific mechanism of injury.  No previous hx of shoulder problems.  He notes pain with overhead activities and pain at night.  he denies any loss of motion.  No neck pain or upper extremity numbness/tingling/weakness.  Taking hydrocodone as needed for pain  Medications Prior to Update: 1)  Plavix 75 Mg Tabs (Clopidogrel Bisulfate) .... Take 1 Tablet By Mouth Once A Day 2)  Niaspan 1000 Mg Cr-Tabs (Niacin (Antihyperlipidemic)) .... Take 2 Tablet By Mouth Once A Day 3)  Accupril 40 Mg Tabs (Quinapril Hcl) .... Take 1 Tablet By Mouth Once A Day For Blood Pressure 4)  Viagra 100 Mg Tabs (Sildenafil Citrate) .... As Needed 5)  Flax Seed Oil 1000 Mg Caps (Flaxseed (Linseed)) .... Take 1 Capsule By Mouth Two Times A Day 6)  Famotidine 40 Mg Tabs (Famotidine) .... Take 1 Tablet By Mouth Once A Day For Upset Stomach 7)  Aspir-Low 81 Mg Tbec (Aspirin) .... Take 1 Tablet By Mouth Once A Day For Circulation 8)  Coreg 6.25 Mg Tabs (Carvedilol) .... Take 1 Tablet By Mouth Two Times A Day 9)  Albuterol 90 Mcg/act  Aers (Albuterol) .... Take 2 Puffs As Needed For Respiratory Difficulty Up To Every 3 Hourly. 10)  Hydrochlorothiazide 25 Mg Tabs (Hydrochlorothiazide) .... Take 1 Tablet By Mouth Once A Day 11)  Sudafed Pe Sinus/allergy 4-10 Mg  Tabs (Chlorpheniramine-Phenylephrine) .... Take 1 Tablet By Mouth Two Times A Day 12)  Nasonex 50 Mcg/act  Susp (Mometasone Furoate) .... Apply On Spray On Each Nostril Daily. 13)  Novolin R 100 Unit/ml Soln  (Insulin Regular Human) .... Inject 30-35 Units Subcutaneously With Each Meal 14)  Insulin Syringe 30g X 5/16" 1 Ml Misc (Insulin Syringe-Needle U-100) .... Use To Inject Insulin 15)  Truetrack Test  Strp (Glucose Blood) .... Use To Test Blood Glucsoe 3x Daily 16)  Lancets Thin  Misc (Lancets) .... Use To Test Blood Glucose 3x Daily 17)  Singulair 10 Mg Tabs (Montelukast Sodium) .... Take 1 Tablet By Mouth Once A Day 18)  Lantus 100 Unit/ml Soln (Insulin Glargine) .... Take 50 Units Daily. 19)  Allegra 180 Mg Tabs (Fexofenadine Hcl) .... Take 1 Pill By Mouth Daily As Needed For Allergy 20)  Debrox 6.5 % Soln (Carbamide Peroxide) .... Put in Each Ear Twice Daily. 21)  Crestor 5 Mg Tabs (Rosuvastatin Calcium) .... Take 1 Pill By Mouth Daily. 22)  Vicodin 5-500 Mg Tabs (Hydrocodone-Acetaminophen) .... Take 1-2 Pills By Mouth As Needed For Shoulder Pain Up To Every 6 Hourly.  Allergies: No Known Drug Allergies  Past History:  Past Medical History: Last updated: 03/17/2008 Diabetes mellitus, type II Hypertension Allergic rhinitis ED Bronchitis Hyperlipidemia hx/o MI, inferior wall s/p stent in 2006  Social History: Reviewed history from 03/17/2008 and no changes required. Single Works as a Gaffer marijuana  Review of Systems       as per hpi  Physical  Exam  General:  Well-developed,well-nourished,in no acute distress; alert,appropriate and cooperative throughout examination Neck:  full and painless ROM.  Negative spurling's test Msk:  R shoulder without any muscle atrophy.  AROM with FF to 170, ABD to 160, 80 ER, IR to L2.  + painful arc.  Neg drop arm.  + hawkins. + empty can.  5/5 rotator cuff strength, but pain with resistance testing.  L shoulder with full and painless ROM with 5/5 strength throughout  He does have some scapular dyskinesis on the R Pulses:  2+ radial pulses Neurologic:  intact distal sensory and motor function Skin:  Intact without  suspicious lesions or rashes Additional Exam:  MSK u/s- R shoulder with normal biceps tendon. Normal subscapularis.  Supraspinatus with calcifications within the tendon, but no evidence of tear.  Normal appearing infraspinatus. Images have been saved to computer and are available for review.    Impression & Recommendations:  Problem # 1:  SHOULDER PAIN, RIGHT (ICD-719.41) Assessment Deteriorated  Rotator cuff tendonitis with impingement.  No evidence of RTC tear on u/s.  Will begin shoulder rehab program focusing on ROM and light strengthening. We have also added scapular stabilization exercises to help with impingement symptoms.  He was given a subacromial injection as well.  He will return for follow up on a as needed basis.  Procedure note:  R shoulder subacromial injection.  Posterior approach. Area prepped with betadine and alcohol.  Ethyl chloride was used for local anesthesia.  5 cc of 0.5% bupivicaine and 1 cc of kenalog 40 mg/ml was injected into the subacromial space.  Patient tolerated the procedure well and without complication.    Orders: US EXTREMITY NON-VASC REAL-TIME IMG [id number]) Joint Aspirate / Injection, Large (20610) Kenalog 10 mg inj (J3301)  Problem # 2:  ROTATOR CUFF SYNDROME, RIGHT (ICD-726.10) Assessment: New  Orders: US EXTREMITY NON-VASC REAL-TIME IMG (95621) Joint Aspirate / Injection, Large (30865) Kenalog 10 mg inj (J3301)  Complete Medication List: 1)  Plavix 75 Mg Tabs (Clopidogrel bisulfate) .... Take 1 tablet by mouth once a day 2)  Niaspan 1000 Mg Cr-tabs (Niacin (antihyperlipidemic)) .... Take 2 tablet by mouth once a day 3)  Accupril 40 Mg Tabs (Quinapril hcl) .... Take 1 tablet by mouth once a day for blood pressure 4)  Viagra 100 Mg Tabs (Sildenafil citrate) .... As needed 5)  Flax Seed Oil 1000 Mg Caps (Flaxseed (linseed)) .... Take 1 capsule by mouth two times a day 6)  Famotidine 40 Mg Tabs (Famotidine) .... Take 1 tablet by mouth once a day  for upset stomach 7)  Aspir-low 81 Mg Tbec (Aspirin) .... Take 1 tablet by mouth once a day for circulation 8)  Coreg 6.25 Mg Tabs (Carvedilol) .... Take 1 tablet by mouth two times a day 9)  Albuterol 90 Mcg/act Aers (Albuterol) .... Take 2 puffs as needed for respiratory difficulty up to every 3 hourly. 10)  Hydrochlorothiazide 25 Mg Tabs (Hydrochlorothiazide) .... Take 1 tablet by mouth once a day 11)  Sudafed Pe Sinus/allergy 4-10 Mg Tabs (Chlorpheniramine-phenylephrine) .... Take 1 tablet by mouth two times a day 12)  Nasonex 50 Mcg/act Susp (Mometasone furoate) .... Apply on spray on each nostril daily. 13)  Novolin R 100 Unit/ml Soln (Insulin regular human) .... Inject 30-35 units subcutaneously with each meal 14)  Insulin Syringe 30g X 5/16" 1 Ml Misc (Insulin syringe-needle u-100) .... Use to inject insulin 15)  Truetrack Test Strp (Glucose blood) .... Use to test blood glucsoe 3x  daily 16)  Lancets Thin Misc (Lancets) .... Use to test blood glucose 3x daily 17)  Singulair 10 Mg Tabs (Montelukast sodium) .... Take 1 tablet by mouth once a day 18)  Lantus 100 Unit/ml Soln (Insulin glargine) .... Take 50 units daily. 19)  Allegra 180 Mg Tabs (Fexofenadine hcl) .... Take 1 pill by mouth daily as needed for allergy 20)  Debrox 6.5 % Soln (Carbamide peroxide) .... Put in each ear twice daily. 21)  Crestor 5 Mg Tabs (Rosuvastatin calcium) .... Take 1 pill by mouth daily. 22)  Vicodin 5-500 Mg Tabs (Hydrocodone-acetaminophen) .... Take 1-2 pills by mouth as needed for shoulder pain up to every 6 hourly.

## 2010-09-18 NOTE — Letter (Signed)
Summary: [facility]  [facility]   Imported By: Margie Billet 11/16/2009 10:37:39  _____________________________________________________________________  External Attachment:    Type:   Image     Comment:   External Document  Appended Document: [facility]   Diabetic Eye Exam  Procedure date:  10/27/2009  Findings:      No diabetic retinopathy.   Cataract.     Procedures Next Due Date:    Diabetic Eye Exam: 04/2010   Diabetic Eye Exam  Procedure date:  10/27/2009  Findings:      No diabetic retinopathy.   Cataract.     Procedures Next Due Date:    Diabetic Eye Exam: 04/2010

## 2010-09-18 NOTE — Letter (Signed)
Summary: Work Writer, Main Office  [address]   [place], [place] [address]   Phone: [phone]  Fax: [phone]     July 05, 2010    Tyler Wilkinson   The above named patient had a medical visit today at:     1:45 pm.  Please take this into consideration when reviewing the time away from work/school.      Sincerely yours,  Architectural technologist

## 2010-09-18 NOTE — Assessment & Plan Note (Signed)
Summary: EST-1 MONTH F/U VISIT/CH   Vital Signs:  Patient profile:   55 year old male Height:      72 inches (182.88 cm) Weight:      234.0 pounds (106.36 kg) BMI:     31.85 Temp:     97.5 degrees F (36.39 degrees C) oral Pulse rate:   91 / minute BP sitting:   142 / 83  (right arm)  Vitals Entered By: Stanton Kidney Ditzler RN (January 30, 2010 4:18 PM) CC: Depression Is Patient Diabetic? Yes Did you bring your meter with you today? No Pain Assessment Patient in pain? yes     Location: right shoulder Intensity: 2-4 Type: ? Onset of pain  continue Nutritional Status BMI of > 30 = obese Nutritional Status Detail appetite good  Have you ever been in a relationship where you felt threatened, hurt or afraid?denies   Does patient need assistance? Functional Status Self care Ambulation Normal Comments FU - doing well - may have new job with insurance.   Primary Care Provider:  Jason Coop MD  CC:  Depression.  History of Present Illness: Tyler Wilkinson comes today for a f/u visit.   1. DM: He did not bring his meter. He checks two times a day. His step brother moved out from his house and he is not taking as much insulin as before as he is worried he will run low and just lie by himself if he  blacks out. At night it is about 350-400, in morning it is 120-200 on average. He never had any lows. He takes 50 of lantus and 40-60 units with meal. He is eating better now, although he doesnot eat right meal. He walks one to two mile per day. He will probably have a new job in a month or two, which wil have better insurance coverage.   2. HTN: He did not take his HCTZ today.   Depression History:      The patient denies a depressed mood most of the day and a diminished interest in his usual daily activities.         Preventive Screening-Counseling & Management  Alcohol-Tobacco     Smoking Status: quit     Smoking Cessation Counseling: yes     Year Quit: only at age 30     Passive  Smoke Exposure: no  Caffeine-Diet-Exercise     Does Patient Exercise: yes     Type of exercise: walking     Times/week: 2  Current Medications (verified): 1)  Plavix 75 Mg Tabs (Clopidogrel Bisulfate) .... Take 1 Tablet By Mouth Once A Day 2)  Niaspan 1000 Mg Cr-Tabs (Niacin (Antihyperlipidemic)) .... Take 2 Tablet By Mouth Once A Day 3)  Accupril 40 Mg Tabs (Quinapril Hcl) .... Take 1 Tablet By Mouth Once A Day For Blood Pressure 4)  Flax Seed Oil 1000 Mg Caps (Flaxseed (Linseed)) .... Take 1 Capsule By Mouth Two Times A Day 5)  Aspir-Low 81 Mg Tbec (Aspirin) .... Take 1 Tablet By Mouth Two Times A Day 6)  Coreg 6.25 Mg Tabs (Carvedilol) .... Take 1 Tablet By Mouth Two Times A Day 7)  Albuterol 90 Mcg/act  Aers (Albuterol) .... Take 2 Puffs As Needed For Respiratory Difficulty Up To Every 3 Hourly. 8)  Hydrochlorothiazide 25 Mg Tabs (Hydrochlorothiazide) .... Take 1 Tablet By Mouth Once A Day 9)  Sudafed Pe Sinus/allergy 4-10 Mg  Tabs (Chlorpheniramine-Phenylephrine) .... Take 1 Tablet By Mouth Two Times A Day 10)  Nasonex 50 Mcg/act  Susp (Mometasone Furoate) .... Apply On Spray On Each Nostril Daily. 11)  Novolin R 100 Unit/ml Soln (Insulin Regular Human) .... Inject 30 Units Subcutaneously With Each Meal 12)  Insulin Syringe 30g X 5/16" 1 Ml Misc (Insulin Syringe-Needle U-100) .... Use To Inject Insulin 13)  Truetrack Test  Strp (Glucose Blood) .... Use To Test Blood Glucsoe 3x Daily 14)  Lancets Thin  Misc (Lancets) .... Use To Test Blood Glucose 3x Daily 15)  Singulair 10 Mg Tabs (Montelukast Sodium) .... Take 1 Tablet By Mouth Once A Day 16)  Lantus 100 Unit/ml Soln (Insulin Glargine) .... Take 55 Units Daily. 17)  Xyzal 5 Mg Tabs (Levocetirizine Dihydrochloride) .... Take 1 Pill By Mouth Daily As Needed For Allergy 18)  Crestor 5 Mg Tabs (Rosuvastatin Calcium) .... Take 1 Pill By Mouth Daily. 19)  Prandin 0.5 Mg Tabs (Repaglinide) .... Take One Tablet 15 Minutes Before Meal If You  Can Not Take Insulin. 20)  Vicodin 5-500 Mg Tabs (Hydrocodone-Acetaminophen) .... Take 1 Pill By Mouth Three Times A Day As Needed For Pain. 21)  Viagra 100 Mg Tabs (Sildenafil Citrate) .... Take 1/2 Pill By Mouth 1 Hour Before Sexual Activity, No More Than Once Daily. 22)  Neurontin 300 Mg Caps (Gabapentin) .... Take 1 Capsule At Bedtime Daily.  Allergies: No Known Drug Allergies  Review of Systems      See HPI  Physical Exam  Mouth:  pharynx pink and moist.   Lungs:  normal breath sounds, no crackles, and no wheezes.   Heart:  normal rate, regular rhythm, no murmur, and no gallop.   Extremities:  trace left pedal edema and trace right pedal edema.   Neurologic:  alert & oriented X3.     Impression & Recommendations:  Problem # 1:  ROTATOR CUFF SYNDROME, RIGHT (ICD-726.10) refilled his vicodin and encouraged to go to sports meds.   Problem # 2:  DIABETES MELLITUS, TYPE II (ICD-250.00) See hpi. His CBGs per his description are elevated. I will increase lantus to 55. Enouraged wt loss and further adherence to diabetic diet.  His updated medication list for this problem includes:    Accupril 40 Mg Tabs (Quinapril hcl) .Marland Kitchen... Take 1 tablet by mouth once a day for blood pressure    Aspir-low 81 Mg Tbec (Aspirin) .Marland Kitchen... Take 1 tablet by mouth two times a day    Novolin R 100 Unit/ml Soln (Insulin regular human) ..... Inject 30 units subcutaneously with each meal    Lantus 100 Unit/ml Soln (Insulin glargine) .Marland Kitchen... Take 55 units daily.    Prandin 0.5 Mg Tabs (Repaglinide) .Marland Kitchen... Take one tablet 15 minutes before meal if you can not take insulin.  Problem # 3:  HYPERTENSION (ICD-401.9) BP little higher than his goal. WIll f/u without any change.  His updated medication list for this problem includes:    Accupril 40 Mg Tabs (Quinapril hcl) .Marland Kitchen... Take 1 tablet by mouth once a day for blood pressure    Coreg 6.25 Mg Tabs (Carvedilol) .Marland Kitchen... Take 1 tablet by mouth two times a day     Hydrochlorothiazide 25 Mg Tabs (Hydrochlorothiazide) .Marland Kitchen... Take 1 tablet by mouth once a day  BP today: 142/83 Prior BP: 122/73 (01/05/2010)  Labs Reviewed: K+: 4.3 (01/05/2010) Creat: : 0.90 (01/05/2010)   Chol: 106 (01/05/2010)   HDL: 42 (01/05/2010)   LDL: 50 (01/05/2010)   TG: 70 (01/05/2010)  Complete Medication List: 1)  Plavix 75 Mg Tabs (Clopidogrel bisulfate) .Marland KitchenMarland KitchenMarland Kitchen  Take 1 tablet by mouth once a day 2)  Niaspan 1000 Mg Cr-tabs (Niacin (antihyperlipidemic)) .... Take 2 tablet by mouth once a day 3)  Accupril 40 Mg Tabs (Quinapril hcl) .... Take 1 tablet by mouth once a day for blood pressure 4)  Flax Seed Oil 1000 Mg Caps (Flaxseed (linseed)) .... Take 1 capsule by mouth two times a day 5)  Aspir-low 81 Mg Tbec (Aspirin) .... Take 1 tablet by mouth two times a day 6)  Coreg 6.25 Mg Tabs (Carvedilol) .... Take 1 tablet by mouth two times a day 7)  Albuterol 90 Mcg/act Aers (Albuterol) .... Take 2 puffs as needed for respiratory difficulty up to every 3 hourly. 8)  Hydrochlorothiazide 25 Mg Tabs (Hydrochlorothiazide) .... Take 1 tablet by mouth once a day 9)  Sudafed Pe Sinus/allergy 4-10 Mg Tabs (Chlorpheniramine-phenylephrine) .... Take 1 tablet by mouth two times a day 10)  Nasonex 50 Mcg/act Susp (Mometasone furoate) .... Apply on spray on each nostril daily. 11)  Novolin R 100 Unit/ml Soln (Insulin regular human) .... Inject 30 units subcutaneously with each meal 12)  Insulin Syringe 30g X 5/16" 1 Ml Misc (Insulin syringe-needle u-100) .... Use to inject insulin 13)  Truetrack Test Strp (Glucose blood) .... Use to test blood glucsoe 3x daily 14)  Lancets Thin Misc (Lancets) .... Use to test blood glucose 3x daily 15)  Singulair 10 Mg Tabs (Montelukast sodium) .... Take 1 tablet by mouth once a day 16)  Lantus 100 Unit/ml Soln (Insulin glargine) .... Take 55 units daily. 17)  Xyzal 5 Mg Tabs (Levocetirizine dihydrochloride) .... Take 1 pill by mouth daily as needed for allergy 18)   Crestor 5 Mg Tabs (Rosuvastatin calcium) .... Take 1 pill by mouth daily. 19)  Prandin 0.5 Mg Tabs (Repaglinide) .... Take one tablet 15 minutes before meal if you can not take insulin. 20)  Vicodin 5-500 Mg Tabs (Hydrocodone-acetaminophen) .... Take 1 pill by mouth three times a day as needed for pain. 21)  Viagra 100 Mg Tabs (Sildenafil citrate) .... Take 1/2 pill by mouth 1 hour before sexual activity, no more than once daily. 22)  Neurontin 300 Mg Caps (Gabapentin) .... Take 1 capsule at bedtime daily.  Patient Instructions: 1)  Please schedule a follow-up appointment in 2 weeks. 2)  Limit your Sodium (Salt) to less than 2 grams a day(slightly less than 1/2 a teaspoon) to prevent fluid retention, swelling, or worsening of symptoms. 3)  Tobacco is very bad for your health and your loved ones! You Should stop smoking!. 4)  Stop Smoking Tips: Choose a Quit date. Cut down before the Quit date. decide what you will do as a substitute when you feel the urge to smoke(gum,toothpick,exercise). 5)  It is important that you exercise regularly at least 20 minutes 5 times a week. If you develop chest pain, have severe difficulty breathing, or feel very tired , stop exercising immediately and seek medical attention. 6)  You need to lose weight. Consider a lower calorie diet and regular exercise.  7)  Check your blood sugars regularly. If your readings are usually above : or below 70 you should contact our office. 8)  It is important that your Diabetic A1c level is checked every 3 months. 9)  See your eye doctor yearly to check for diabetic eye damage. 10)  Check your feet each night for sore areas, calluses or signs of infection. 11)  Check your Blood Pressure regularly. If it is above: you should  make an appointment. Prescriptions: VICODIN 5-500 MG TABS (HYDROCODONE-ACETAMINOPHEN) take 1 pill by mouth three times a day as needed for pain.  #50 x 0   Entered and Authorized by:   Jason Coop MD    Signed by:   Jason Coop MD on 01/30/2010   Method used:   Print then Give to Patient   RxID:   [id number] VIAGRA 100 MG TABS (SILDENAFIL CITRATE) take 1/2 pill by mouth 1 hour before sexual activity, no more than once daily.  #30 x 0   Entered and Authorized by:   Jason Coop MD   Signed by:   Jason Coop MD on 01/30/2010   Method used:   Print then Give to Patient   RxID:   [id number]    Prevention & Chronic Care Immunizations   Influenza vaccine: Fluvax 3+  (05/19/2009)   Influenza vaccine deferral: Deferred  (04/03/2009)    Tetanus booster: Not documented   Td booster deferral: Deferred  (04/03/2009)    Pneumococcal vaccine: Not documented   Pneumococcal vaccine deferral: Deferred  (04/03/2009)  Colorectal Screening   Hemoccult: Not documented   Hemoccult action/deferral: Deferred  (04/03/2009)    Colonoscopy: Not documented   Colonoscopy action/deferral: Deferred  (04/03/2009)  Other Screening   PSA: Not documented   PSA action/deferral: Discussion deferred  (04/03/2009)   Smoking status: quit  (01/30/2010)  Diabetes Mellitus   HgbA1C: 8.5  (01/05/2010)   HgbA1C action/deferral: Deferred  (04/03/2009)   Hemoglobin A1C due: 09/16/2007    Eye exam: No diabetic retinopathy.   Cataract.     (10/27/2009)   Eye exam due: 04/2010    Foot exam: yes  (09/28/2008)   High risk foot: No  (09/28/2008)   Foot care education: Done  (09/28/2008)   Foot exam due: 12/18/2007    Urine microalbumin/creatinine ratio: 7.0  (01/05/2010)   Urine microalbumin/cr due: 12/21/2007    Diabetes flowsheet reviewed?: Yes   Progress toward A1C goal: Improved  Lipids   Total Cholesterol: 106  (01/05/2010)   Lipid panel action/deferral: Deferred   LDL: 50  (01/05/2010)   LDL Direct: Not documented   HDL: 42  (01/05/2010)   Triglycerides: 70  (01/05/2010)   Lipid panel due: 12/19/2007    SGOT (AST): 37  (01/05/2010)   SGPT (ALT): 50  (01/05/2010)    Alkaline phosphatase: 113  (01/05/2010)   Total bilirubin: 0.6  (01/05/2010)    Lipid flowsheet reviewed?: Yes   Progress toward LDL goal: Unchanged  Hypertension   Last Blood Pressure: 142 / 83  (01/30/2010)   Serum creatinine: 0.90  (01/05/2010)   BMP action: Deferred   Serum potassium 4.3  (01/05/2010)    Hypertension flowsheet reviewed?: Yes   Progress toward BP goal: Deteriorated  Self-Management Support :   Personal Goals (by the next clinic visit) :     Personal A1C goal: 7  (05/19/2009)     Personal blood pressure goal: 140/90  (05/19/2009)     Personal LDL goal: 70  (05/19/2009)    Diabetes self-management support: Written self-care plan  (01/30/2010)   Diabetes care plan printed   Last diabetes self-management training by diabetes educator: 03/03/2009    Hypertension self-management support: Written self-care plan  (01/30/2010)   Hypertension self-care plan printed.    Lipid self-management support: Written self-care plan  (01/30/2010)   Lipid self-care plan printed.

## 2010-09-18 NOTE — Progress Notes (Signed)
Summary: phone/gg  Phone Note Call from Patient   Summary of Call: yesterday noted ? bites on rt upper arm.  areas seem raw and burning.  Using otc med.  denies fever.  denies nausea, vomiting. Pt has DM and not feeling well. will see today at 1530 Initial call taken by: Merrie Roof RN,  February 24, 2009 1:48 PM

## 2010-09-18 NOTE — Miscellaneous (Signed)
Summary: Orders Update  Clinical Lists Changes  Orders: Added new Referral order of Dental Referral (Dentist) - Signed       Complete Medication List: 1)  Lantus 100 Unit/ml Soln (Insulin glargine) .... Take 60 units at bedtime for diabetes. 2)  Plavix 75 Mg Tabs (Clopidogrel bisulfate) .... Take 1 tablet by mouth once a day 3)  Niaspan 500 Mg Tbcr (Niacin (antihyperlipidemic)) .... Take 2 tablets by mouth once a day 4)  Accupril 40 Mg Tabs (Quinapril hcl) .... Take 1 tablet by mouth once a day for blood pressure 5)  Viagra 100 Mg Tabs (Sildenafil citrate) .... As needed 6)  Flonase 50 Mcg/act Susp (Fluticasone propionate) .... Give two puffs to each nostril every day 7)  Flax Seed Oil 1000 Mg Caps (Flaxseed (linseed)) .... Take 1 capsule by mouth two times a day 8)  Famotidine 40 Mg Tabs (Famotidine) .... Take 1 tablet by mouth once a day for upset stomach 9)  Aspir-low 81 Mg Tbec (Aspirin) .... Take 1 tablet by mouth once a day for circulation 10)  Novolog Flexpen Soln (Insulin aspart soln) .... Take 30-35 units with each meal 11)  Toprol Xl 100 Mg Tb24 (Metoprolol succinate) .... Take 1 tablet by mouth once a day 12)  Allegra 180 Mg Tabs (Fexofenadine hcl) .... Take 1 tablet by mouth once a day 13)  Albuterol 90 Mcg/act Aers (Albuterol) .... Take 2 puffs as needed for respiratory difficulty up to every 3 hourly. 14)  Augmentin Xr 1000-62.5 Mg Tb12 (Amoxicillin-pot clavulanate) .... Take 1 pill by mouth two times a day for 10 days. 15)  Hydrochlorothiazide 12.5 Mg Tabs (Hydrochlorothiazide) .... Take 1 tablet by mouth once a day

## 2010-09-18 NOTE — Progress Notes (Signed)
Summary: Refill/gh  Phone Note Refill Request Message from:  Fax from Pharmacy on April 16, 2010 11:06 AM  Refills Requested: Medication #1:  PLAVIX 75 MG TABS Take 1 tablet by mouth once a day   Last Refilled: 03/21/2010  Medication #2:  NOVOLIN R 100 UNIT/ML SOLN Inject 30 units subcutaneously with each meal   Last Refilled: 05/30/2009 Last office visit was 01/30/2010.  Last labs were 01/05/2010.   Method Requested: Fax to Local Pharmacy Initial call taken by: Angelina Ok RN,  April 16, 2010 11:06 AM  Follow-up for Phone Call        Will refill. Last seen June. Sent flag to Ms Lissa Hoard to make appt with Dr Tonny Branch. Poorply controlled DM. Follow-up by: Blanch Media MD,  April 16, 2010 11:18 AM    Prescriptions: NOVOLIN R 100 UNIT/ML SOLN (INSULIN REGULAR HUMAN) Inject 30 units subcutaneously with each meal  #3 10mL vial x 2   Entered and Authorized by:   Blanch Media MD   Signed by:   Blanch Media MD on 04/16/2010   Method used:   Faxed to ...       [facility] Department (retail)       [address] [place], [place]  [address]       Ph: [phone]       Fax: [phone]   RxID:   [id number] PLAVIX 75 MG TABS (CLOPIDOGREL BISULFATE) Take 1 tablet by mouth once a day  #30 x 5   Entered and Authorized by:   Blanch Media MD   Signed by:   Blanch Media MD on 04/16/2010   Method used:   Faxed to ...       [facility] Department (retail)       [address] [place], [place]  [address]       Ph: [phone]       Fax: [phone]   RxID:   [id number]

## 2010-09-18 NOTE — Letter (Signed)
Summary: [facility]   Imported By: Margie Billet 11/09/2009 11:11:56  _____________________________________________________________________  External Attachment:    Type:   Image     Comment:   External Document

## 2010-09-18 NOTE — Progress Notes (Signed)
Summary: refill/ hla  Phone Note Refill Request Message from:  Fax from Pharmacy on February 22, 2010 2:42 PM  Refills Requested: Medication #1:  NASONEX 50 MCG/ACT  SUSP Apply on spray on each nostril daily.   Dosage confirmed as above?Dosage Confirmed   Last Refilled: 6/11  Medication #2:  CRESTOR 5 MG TABS take 1 pill by mouth daily.   Dosage confirmed as above?Dosage Confirmed   Last Refilled: 6/11  Medication #3:  SINGULAIR 10 MG TABS Take 1 tablet by mouth once a day   Dosage confirmed as above?Dosage Confirmed   Last Refilled: 6/11 last visit 6/14 and lab 5/20  Initial call taken by: Marin Roberts RN,  February 22, 2010 2:43 PM  Follow-up for Phone Call        Refill approved-nurse to complete Follow-up by: Ulyess Mort MD,  February 22, 2010 4:14 PM  Additional Follow-up for Phone Call Additional follow up Details #1::        Rx called to pharmacy Additional Follow-up by: Marin Roberts RN,  February 23, 2010 10:00 AM    Prescriptions: CRESTOR 5 MG TABS (ROSUVASTATIN CALCIUM) take 1 pill by mouth daily.  #30 x 0   Entered and Authorized by:   Ulyess Mort MD   Signed by:   Ulyess Mort MD on 02/22/2010   Method used:   Telephoned to ...       [facility] Department (retail)       [address] [place], [place]  [address]       Ph: [phone]       Fax: [phone]   RxID:   [id number] SINGULAIR 10 MG TABS (MONTELUKAST SODIUM) Take 1 tablet by mouth once a day  #30 x 0   Entered and Authorized by:   Ulyess Mort MD   Signed by:   Ulyess Mort MD on 02/22/2010   Method used:   Telephoned to ...       [facility] Department (retail)       [address] [place], [place]  [address]       Ph: [phone]       Fax: [phone]   RxID:   [id number] NASONEX 50 MCG/ACT  SUSP (MOMETASONE FUROATE) Apply on spray on each nostril daily.  #1 month x 0   Entered and Authorized by:   Ulyess Mort MD   Signed by:   Ulyess Mort MD on 02/22/2010   Method used:   Telephoned to ...       [facility] Department (retail)       [address] [place], [place]  [address]       Ph: [phone]       Fax: [phone]   RxID:   [id number]

## 2010-09-18 NOTE — Assessment & Plan Note (Signed)
Summary: diabetes self management training/dmr              Is Patient Diabetic? Yes  CBG Result 192 CBG Device ID true track       Current Allergies: ! * GUAIFENESIN           ] Last LDL:                                                 87 (07/24/2007 10:27:00 PM)        Assessment Daily activities: watches TV and eats from 12 midnight -  ~ 5am sleeps from 5-1PM Special needs or Barriers: ADD/ADHD reported Learns best by: Hearing and doing- memorizing-not written Readiness to learn:   Preparation  Diabetes Medications:  Comments: takes 30-40 units fasting acting/day. tried to avoid having to take injections at work.  Current Insulin Use   Rapid/Short Insulin Type:Novolog  Long Acting  Insulin Type:Lantus Dinner Dose: 60 units   Insulin to Carb Ratio: 1 unit:7g carb Currently using Insulin Pump? No  Monitoring Self monitoring blood glucose 3 times a day Measures urine ketones? No Name of Meter  true track Wears Medical I.D. Yes   Carrys Food for Low Blood sugar No   Can you tell if your blood sugar is low? Yes     Fat: Excessive fat intake Salt: excessive sodium intake  Nutrition assessment ETOH : No What beverages do you drink?  still tryin gto drink diet, but drinks regular soda and drinks often by report  Activity Limitations  Inadequate physical activity Would you  say you are physically active: No  Nutritional Management Identify what foods most often affect blood glucose: Demonstrates competency Verbalize importance of controlling food portions: Demonstrates competency State importance of spacing and not omitting meals and snacks: Demonstrates competency State changes planned for home meals/snacks: Demonstrates competency  Monitoring State purpose and frequency of monitoring BG-ketones-HgbA1C and when to contact health care team with results: Demonstrates competency Perform glucose monitoring/ketone testing and record  results correctly: Demonstrates competency State target blood glucose and HgbA1C goals: Demonstrates competency  Complications State the causes-signs and symptoms and prevention of Hyperglycemia: Demonstrates competency Explain proper treatment of hyperglycemia: Demonstrates competency State the causes- signs and symptoms and prevention of hypoglycemia: Demonstrates competency Explain proper treatment of hypoglycemia: Demonstrates competency State the relationship between blood glucose control and the development/prevention of long-term complications: Demonstrates competency State benefits-risks-and options for improving blood sugar control: Demonstrates competency State the relationship between blood pressure and lipid control in the prevention/control of cardiovascular disease: Demonstrates competency State the principles of skin-dental and foot care: Needs review/assistance Describe symptoms of skin and foot problems and describe foot exam: Needs review/assistance State when to seek medical advice and treatment: Needs review/assistance  Psychosocial Adjustment State three common feelings that might be experienced when learning to cope with diabetes: Demonstrates competency Identify two methods to cope with these feelings: Demonstrates competency Identify how stress affects diabetes & two sources of stress: Needs review/assistance Name two ways of obtaining support from family/friends: Demonstrates competency        Reviewed carb couting in regards to how much to take for the foods he is eating rather than actual counting- 1 unit for a slice of bread, 7 units for a 20 ounce drink with sugar, etc...  Meter downloaded- will be scanned.  reports  having to eat at night at work to keep from dropping on current lantus dose  Continues to eat out often and chooses high fat foods, but repots less sweets lately and a desire to eat more fruit.  Still not exercising, although picked this as a  goal for self improvement today. Is in prepartaion stage, therfore not quite ready to make goal for action.    Feel group visit may be helpful for support. Diabetes self management support: plans to take diabetes classes at [facility] department and follow-up  here at doctor's office about every 2 -3 months   Last LDL:                                                 87 (07/24/2007 10:27:00 PM)

## 2010-09-18 NOTE — Assessment & Plan Note (Signed)
Summary: sore throat,cold symptoms/gg   Vital Signs:  Patient Profile:   55 Years Old Male Height:     73 inches (185.42 cm) Weight:      226.1 pounds (102.77 kg) BMI:     29.94 Temp:     97.8 degrees F (36.56 degrees C) Pulse rate:   72 / minute BP sitting:   134 / 83  (left arm)  Pt. in pain?   no  Vitals Entered By: Dorie Rank RN (July 20, 2008 3:50 PM)              Is Patient Diabetic? Yes Did you bring your meter with you today? No Nutritional Status BMI of 25 - 29 = overweight CBG Result 136  Does patient need assistance? Functional Status Self care Ambulation Normal Comments sneezed some yellow nasal discharge earlier     PCP:  Tacey Ruiz MD  Chief Complaint:  throat pain Sunday that caused ears to hurt and also used nasal decongestant which is helping with congestion - no cough.  History of Present Illness: 55 yo w/ significant PMH outline below presents with ST, odynophagia, fatigue, sinus congestion, sneezing x 3 days, yellow nasal discarge x 1 day, no cough, sx have improved slightly today, no fevers/ chills.   Pt has had frequent sinus congestion in past and reports relief with antibiotics. Pt has been taking afrin since yesterday. Pt also has prominent allergic sx at baseline and  takes allegra for allergies. Pt has built up resistance to nasal steroids in past, but has tried to stay away from them recently.  Pt reports memory impairment with Metoprolol XL.    Current Allergies: No known allergies     Risk Factors: Tobacco use:  quit    Year quit:  only at age 65 Passive smoke exposure:  no Drug use:  yes    Substance:  marijuana Alcohol use:  no Exercise:  yes    Times per week:  2    Type:  walking Seatbelt use:  100 %    Physical Exam  General:     alert, mildly sick appearing Eyes:     Allergic shiners bilaterally Ears:     Occlusive ear wax on right, normal TM and mild ear wax on left Mouth:     good dentition and  pharynx pink and moist.  No pharyngeal exudate or lesions. Lungs:     normal respiratory effort, no crackles. Mild expiratory wheezes. Heart:     normal rate, regular rhythm, no murmur, no gallop, and no rub.   Cervical Nodes:     no anterior cervical adenopathy.      Impression & Recommendations:  Problem # 1:  SINUSITIS (ICD-473.9) Given symptoms have only lasted for 3 days, will hold off on Abx. Told patient to call clinic if symptoms last for another week and we will consider Abx at that time. For now, recommend continuing Afrin spray for total of three days, resuming nasonex, and using saline nose spray as needed.   Pt also has mild pharyngitis, likely viral. No exudate or significant erythema seen on exam. Recommended OTC chloraseptic for symptoms.  His updated medication list for this problem includes:    Sudafed Pe Sinus/allergy 4-10 Mg Tabs (Chlorpheniramine-phenylephrine) .Marland Kitchen... Take 1 tablet by mouth two times a day    Nasonex 50 Mcg/act Susp (Mometasone furoate) .Marland Kitchen... Apply on spray on each nostril daily.    Mucinex 600 Mg Xr12h-tab (Guaifenesin) .Marland Kitchen... Take 1 tablet by  mouth two times a day   Problem # 2:  CORONARY ARTERY DISEASE (ICD-414.00) Pt reports memory loss, he associates with Metop XL. For this reason, will change patient to Coreg 6.25 two times a day to see if this has same effect. 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   His updated medication list for this problem includes:    Plavix 75 Mg Tabs (Clopidogrel bisulfate) .Marland Kitchen... Take 1 tablet by mouth once a day    Accupril 40 Mg Tabs (Quinapril hcl) .Marland Kitchen... Take 1 tablet by mouth once a day for blood pressure    Aspir-low 81 Mg Tbec (Aspirin) .Marland Kitchen... Take 1 tablet by mouth once a day for circulation    Coreg 6.25 Mg Tabs (Carvedilol) .Marland Kitchen... Take 1 tablet by mouth two times a day    Hydrochlorothiazide 25 Mg Tabs (Hydrochlorothiazide) .Marland Kitchen... Take 1 tablet by mouth once a day   Problem # 3:  ALLERGIC RHINITIS  (ICD-477.9) Pt has prominent allergy symptoms at baseline and had received benefit from Singulair in past. Will restart Singulair for patient.  Continue allegra and nasal sprays.  His updated medication list for this problem includes:    Alavert 10 Mg Tabs (Loratadine) .Marland Kitchen... Take 1 tablet by mouth once a day    Nasonex 50 Mcg/act Susp (Mometasone furoate) .Marland Kitchen... Apply on spray on each nostril daily.   Problem # 4:  HYPERTENSION (ICD-401.9) Pt not quite at DM goal of 130/80. Change from Metop to Coreg may provide more of an anti-hypertensive effect given coreg has alpha-antagonist properties. Will continue to monitor.   His updated medication list for this problem includes:    Accupril 40 Mg Tabs (Quinapril hcl) .Marland Kitchen... Take 1 tablet by mouth once a day for blood pressure    Coreg 6.25 Mg Tabs (Carvedilol) .Marland Kitchen... Take 1 tablet by mouth two times a day    Hydrochlorothiazide 25 Mg Tabs (Hydrochlorothiazide) .Marland Kitchen... Take 1 tablet by mouth once a day  BP today: 134/83 Prior BP: 141/87 (05/04/2008)  Labs Reviewed: Creat: 1.13 (04/08/2008) Chol: 151 (04/08/2008)   HDL: 36 (04/08/2008)   LDL: 91 (04/08/2008)   TG: 122 (04/08/2008)   Complete Medication List: 1)  Lantus 100 Unit/ml Soln (Insulin glargine) .... Take 70 units at bedtime for diabetes. 2)  Plavix 75 Mg Tabs (Clopidogrel bisulfate) .... Take 1 tablet by mouth once a day 3)  Niaspan 1000 Mg Cr-tabs (Niacin (antihyperlipidemic)) .... Take 2 tablet by mouth once a day 4)  Accupril 40 Mg Tabs (Quinapril hcl) .... Take 1 tablet by mouth once a day for blood pressure 5)  Viagra 100 Mg Tabs (Sildenafil citrate) .... As needed 6)  Flax Seed Oil 1000 Mg Caps (Flaxseed (linseed)) .... Take 1 capsule by mouth two times a day 7)  Famotidine 40 Mg Tabs (Famotidine) .... Take 1 tablet by mouth once a day for upset stomach 8)  Aspir-low 81 Mg Tbec (Aspirin) .... Take 1 tablet by mouth once a day for circulation 9)  Coreg 6.25 Mg Tabs (Carvedilol)  .... Take 1 tablet by mouth two times a day 10)  Albuterol 90 Mcg/act Aers (Albuterol) .... Take 2 puffs as needed for respiratory difficulty up to every 3 hourly. 11)  Hydrochlorothiazide 25 Mg Tabs (Hydrochlorothiazide) .... Take 1 tablet by mouth once a day 12)  Alavert 10 Mg Tabs (Loratadine) .... Take 1 tablet by mouth once a day 13)  Sudafed Pe Sinus/allergy 4-10 Mg Tabs (Chlorpheniramine-phenylephrine) .... Take 1 tablet by mouth  two times a day 14)  Nasonex 50 Mcg/act Susp (Mometasone furoate) .... Apply on spray on each nostril daily. 15)  Mucinex 600 Mg Xr12h-tab (Guaifenesin) .... Take 1 tablet by mouth two times a day 16)  Novolin R 100 Unit/ml Soln (Insulin regular human) .... Inject 30-35 units subcutaneously with each meal 17)  Insulin Syringe 30g X 5/16" 1 Ml Misc (Insulin syringe-needle u-100) .... Use to inject insulin 18)  Truetrack Test Strp (Glucose blood) .... Use to test blood glucsoe 3x daily 19)  Lancets Thin Misc (Lancets) .... Use to test blood glucose 3x daily 20)  Singulair 10 Mg Tabs (Montelukast sodium) .... Take 1 tablet by mouth once a day  Other Orders: T- Capillary Blood Glucose (82948) T-Hgb A1C (in-house) (16109UE)   Patient Instructions: 1)  Please schedule a follow-up appointment in 1 month. 2)  If sinus symptoms persist for more than one week, please call clinic at [phone].  3)  We have switched you from Toprol XL to Coreg, which you should take twice a day. 4)  For your sinus/ throat symptoms, use Afrin for just three days, and continue to use nasonex as indicated. You can also get saline nasal spray from the pharmacy over the counter and use that to help relieve the nasal build-up. 5)  We have also started you back on singulair because of your persistent allergies. Please let us know if you have any benefits from this.     Prescriptions: COREG 6.25 MG TABS (CARVEDILOL) Take 1 tablet by mouth two times a day  #60 x 3   Entered and Authorized by:    Linward Foster MD   Signed by:   Linward Foster MD on 07/20/2008   Method used:   Print then Give to Patient   RxID:   [id number] SINGULAIR 10 MG TABS (MONTELUKAST SODIUM) Take 1 tablet by mouth once a day  #30 x 3   Entered and Authorized by:   Linward Foster MD   Signed by:   Linward Foster MD on 07/20/2008   Method used:   Print then Give to Patient   RxID:   [id number]  ] Laboratory Results   Blood Tests   Date/Time Received: July 20, 2008 3:59 PM  Date/Time Reported: Oren Beckmann  July 20, 2008 3:59 PM   HGBA1C: 9.1%   (Normal Range: Non-Diabetic - 3-6%   Control Diabetic - 6-8%) CBG Random:: 136mg /dL     Appended Document: sore throat,cold symptoms/gg        Current Allergies: No known allergies         Impression & Recommendations:  Problem # 1:  SINUSITIS (ICD-473.9)  His updated medication list for this problem includes:    Sudafed Pe Sinus/allergy 4-10 Mg Tabs (Chlorpheniramine-phenylephrine) .Marland Kitchen... Take 1 tablet by mouth two times a day    Nasonex 50 Mcg/act Susp (Mometasone furoate) .Marland Kitchen... Apply on spray on each nostril daily.    Mucinex 600 Mg Xr12h-tab (Guaifenesin) .Marland Kitchen... Take 1 tablet by mouth two times a day   Complete Medication List: 1)  Lantus 100 Unit/ml Soln (Insulin glargine) .... Take 70 units at bedtime for diabetes. 2)  Plavix 75 Mg Tabs (Clopidogrel bisulfate) .... Take 1 tablet by mouth once a day 3)  Niaspan 1000 Mg Cr-tabs (Niacin (antihyperlipidemic)) .... Take 2 tablet by mouth once a day 4)  Accupril 40 Mg Tabs (Quinapril hcl) .... Take 1 tablet by mouth once a day for blood pressure 5)  Viagra 100  Mg Tabs (Sildenafil citrate) .... As needed 6)  Flax Seed Oil 1000 Mg Caps (Flaxseed (linseed)) .... Take 1 capsule by mouth two times a day 7)  Famotidine 40 Mg Tabs (Famotidine) .... Take 1 tablet by mouth once a day for upset stomach 8)  Aspir-low 81 Mg Tbec (Aspirin) .... Take 1 tablet by mouth once a day for  circulation 9)  Coreg 6.25 Mg Tabs (Carvedilol) .... Take 1 tablet by mouth two times a day 10)  Albuterol 90 Mcg/act Aers (Albuterol) .... Take 2 puffs as needed for respiratory difficulty up to every 3 hourly. 11)  Hydrochlorothiazide 25 Mg Tabs (Hydrochlorothiazide) .... Take 1 tablet by mouth once a day 12)  Alavert 10 Mg Tabs (Loratadine) .... Take 1 tablet by mouth once a day 13)  Sudafed Pe Sinus/allergy 4-10 Mg Tabs (Chlorpheniramine-phenylephrine) .... Take 1 tablet by mouth two times a day 14)  Nasonex 50 Mcg/act Susp (Mometasone furoate) .... Apply on spray on each nostril daily. 15)  Mucinex 600 Mg Xr12h-tab (Guaifenesin) .... Take 1 tablet by mouth two times a day 16)  Novolin R 100 Unit/ml Soln (Insulin regular human) .... Inject 30-35 units subcutaneously with each meal 17)  Insulin Syringe 30g X 5/16" 1 Ml Misc (Insulin syringe-needle u-100) .... Use to inject insulin 18)  Truetrack Test Strp (Glucose blood) .... Use to test blood glucsoe 3x daily 19)  Lancets Thin Misc (Lancets) .... Use to test blood glucose 3x daily 20)  Singulair 10 Mg Tabs (Montelukast sodium) .... Take 1 tablet by mouth once a day    ]

## 2010-09-18 NOTE — Assessment & Plan Note (Signed)
Summary: RT SHOULDER PAIN/[facility]   Vital Signs:  Patient profile:   55 year old male BP sitting:   147 / 90  Vitals Entered By: Lillia Pauls CMA (June 16, 2009 11:14 AM)  Primary Care Provider:  Tacey Ruiz MD   History of Present Illness: r shoulder pain for 5 days. he fell on the shoulder after stumbling over soem old tires. Had a problem w this shoulder a year or so ago, had an injection and had no problems until his recent injury. Pain w overhead motion.  Allergies: No Known Drug Allergies  Physical Exam  General:  alert and well-developed.   Additional Exam:  Patient given informed consent for injection. Discussed possible complications of infection, bleeding or skin atrophy at site of injection. Possible side effect of avascular necrosis (focal area of bone death) due to steroid use.Appropriate verbal time out taken Are cleaned and prepped in usual sterile fashion. A --1-- cc kennalog plus --4--cc 1% lidocaine without epinephrine was injected into the--right subacromial bursa using posterior approach-. Patient tolerated procedure well with no complications.    Shoulder/Elbow Exam  Shoulder Exam:    Right:    Inspection:  Normal    Stability:  stable    Tenderness:  right bicipital groove    Swelling:  no    Erythema:  no    Pain w supraspinatus testing, pain w ER lift off test. Distally NV uintact and normal grip strength. No tenderness in deltoid but pain here w lateral abduction above shoulder. AC joint non tender.   Impression & Recommendations:  Problem # 1:  ROTATOR CUFF SYNDROME, RIGHT (ICD-726.10)  injection tehrapy gave him handout on rotator cuff strengthening rtc prn  Orders: Joint Aspirate / Injection, Large (20610)  Complete Medication List: 1)  Plavix 75 Mg Tabs (Clopidogrel bisulfate) .... Take 1 tablet by mouth once a day 2)  Niaspan 1000 Mg Cr-tabs (Niacin (antihyperlipidemic)) .... Take 2 tablet by mouth once a day 3)  Accupril 40 Mg Tabs  (Quinapril hcl) .... Take 1 tablet by mouth once a day for blood pressure 4)  Flax Seed Oil 1000 Mg Caps (Flaxseed (linseed)) .... Take 1 capsule by mouth two times a day 5)  Aspir-low 81 Mg Tbec (Aspirin) .... Take 1 tablet by mouth two times a day 6)  Coreg 6.25 Mg Tabs (Carvedilol) .... Take 1 tablet by mouth two times a day 7)  Albuterol 90 Mcg/act Aers (Albuterol) .... Take 2 puffs as needed for respiratory difficulty up to every 3 hourly. 8)  Hydrochlorothiazide 25 Mg Tabs (Hydrochlorothiazide) .... Take 1 tablet by mouth once a day 9)  Sudafed Pe Sinus/allergy 4-10 Mg Tabs (Chlorpheniramine-phenylephrine) .... Take 1 tablet by mouth two times a day 10)  Nasonex 50 Mcg/act Susp (Mometasone furoate) .... Apply on spray on each nostril daily. 11)  Novolin R 100 Unit/ml Soln (Insulin regular human) .... Inject 30 units subcutaneously with each meal 12)  Insulin Syringe 30g X 5/16" 1 Ml Misc (Insulin syringe-needle u-100) .... Use to inject insulin 13)  Truetrack Test Strp (Glucose blood) .... Use to test blood glucsoe 3x daily 14)  Lancets Thin Misc (Lancets) .... Use to test blood glucose 3x daily 15)  Singulair 10 Mg Tabs (Montelukast sodium) .... Take 1 tablet by mouth once a day 16)  Lantus 100 Unit/ml Soln (Insulin glargine) .... Take 50 units daily. 17)  Allegra 180 Mg Tabs (Fexofenadine hcl) .... Take 1 pill by mouth daily as needed for allergy 18)  Crestor 5 Mg Tabs (Rosuvastatin calcium) .... Take 1 pill by mouth daily. 19)  Prandin 0.5 Mg Tabs (Repaglinide) .... Take one tablet 15 minutes before meal if you can not take insulin. 20)  Vicodin 5-500 Mg Tabs (Hydrocodone-acetaminophen) .... Take 1 pill by mouth three times a day as needed for pain. 21)  Cialis 10 Mg Tabs (Tadalafil) .... Take 1 pill by mouth 30 minutes as needed before sexual activity, don't take more than a pill per day.

## 2010-09-18 NOTE — Progress Notes (Signed)
Summary: med refill/gp  Phone Note Refill Request Message from:  Fax from Pharmacy on May 28, 2010 2:39 PM  Refills Requested: Medication #1:  XYZAL 5 MG TABS take 1 pill by mouth daily as needed for allergy   Last Refilled: 02/16/2010 Last appt. 6/14; next appt. 10/21.   Method Requested: Telephone to Pharmacy Initial call taken by: Chinita Pester RN,  May 28, 2010 2:39 PM  Follow-up for Phone Call       Follow-up by: Blanch Media MD,  May 28, 2010 2:46 PM    Prescriptions: XYZAL 5 MG TABS (LEVOCETIRIZINE DIHYDROCHLORIDE) take 1 pill by mouth daily as needed for allergy  #30 x 5   Entered and Authorized by:   Blanch Media MD   Signed by:   Blanch Media MD on 05/28/2010   Method used:   Faxed to ...       [facility] Department (retail)       [address] [place], [place]  [address]       Ph: [phone]       Fax: [phone]   RxID:   [id number]

## 2010-09-18 NOTE — Letter (Signed)
Summary: Handout Printed  Printed Handout:  - *Patient Instructions 

## 2010-09-18 NOTE — Progress Notes (Signed)
Summary: refill/ [facility]  Phone Note Refill Request Message from:  Patient on October 30, 2009 4:59 PM  Refills Requested: Medication #1:  SINGULAIR 10 MG TABS Take 1 tablet by mouth once a day   Last Refilled: 12/30 Initial call taken by: Marin Roberts RN,  October 30, 2009 5:00 PM  Follow-up for Phone Call       Follow-up by: Jason Coop MD,  October 31, 2009 8:34 AM    Prescriptions: SINGULAIR 10 MG TABS (MONTELUKAST SODIUM) Take 1 tablet by mouth once a day  #30 x 3   Entered and Authorized by:   Jason Coop MD   Signed by:   Jason Coop MD on 10/31/2009   Method used:   Telephoned to ...       [facility] Department (retail)       [address] [place], [place]  [address]       Ph: [phone]       Fax: [phone]   RxID:   [id number]

## 2010-09-18 NOTE — Assessment & Plan Note (Signed)
Summary: est-ck/fu/meds/cfb   Vital Signs:  Patient profile:   55 year old male Height:      72 inches (182.88 cm) Weight:      220.5 pounds (100.23 kg) BMI:     30.01 Temp:     97.0 degrees F (36.11 degrees C) oral Pulse rate:   76 / minute BP sitting:   141 / 80  (right arm)  Vitals Entered By: Stanton Kidney Ditzler RN (June 08, 2010 2:33 PM) Is Patient Diabetic? Yes Did you bring your meter with you today? No Pain Assessment Patient in pain? yes     Location: right shoulder Intensity: 3-4 Type: throbbing Onset of pain  long time Nutritional Status BMI of > 30 = obese Nutritional Status Detail appetite down  Have you ever been in a relationship where you felt threatened, hurt or afraid?denies   Does patient need assistance? Functional Status Self care Ambulation Normal Comments FU - problems with stiff hands.   Primary Care Provider:  Leodis Sias MD   History of Present Illness: Patient is a 55 year old man who presents today for follow up of his chronic medical problem including HTN, DM II, HLD, and CAD.  He reports that he has been taking his medications except for his Prandin and has no problems with hypoglycemia.    He has had problems with his hearing.  He has noticed that when he is facing someone and there are no other sounds that he can hear fine but if they turn away or there are other sounds in the room he is unable to hear what is being said.  He has had a few episodes of ringing in his ear and has a long history of cerum impaction that is worse on the right then the left.  He denies any dizziness, fullness in his ears, headaches, or vision changes.    He recently started a new job and with the increased working with his hands he has noticed that he has had some numbness and tingling as well as pain in both hands.  His right hand feels like it throbs with pain in the lateral side of his hand and elbow.  This pain has been waking him up at night.  On the left  side his fingers hurt mostly around his thumb and he gets numbness and tingling in the thumb and palm.  He has been prescribed Neurontin before but has been unable to tolerate it because of the sleepiness and hangover feeling he gets the next morning after taking it.  Shaking his hands out helps and he has occasionally had to take a vicodin to help the pain.    He is also interested in his yearly flu shot.    Depression History:      The patient denies a depressed mood most of the day and a diminished interest in his usual daily activities.        The patient denies that he feels like life is not worth living, denies that he wishes that he were dead, and denies that he has thought about ending his life.         Preventive Screening-Counseling & Management  Alcohol-Tobacco     Smoking Status: quit     Smoking Cessation Counseling: yes     Year Quit: only at age 55     Passive Smoke Exposure: no  Caffeine-Diet-Exercise     Does Patient Exercise: yes     Type of exercise: walking  Times/week: 2  Colonoscopy  Procedure date:  06/02/2009  Findings:      Results: Normal. Location:  [facility].    Comments:      Repeat colonoscopy in 10 years.    Current Medications (verified): 1)  Plavix 75 Mg Tabs (Clopidogrel Bisulfate) .... Take 1 Tablet By Mouth Once A Day 2)  Niaspan 1000 Mg Cr-Tabs (Niacin (Antihyperlipidemic)) .... Take 2 Tablet By Mouth Once A Day 3)  Accupril 40 Mg Tabs (Quinapril Hcl) .... Take 1 Tablet By Mouth Once A Day For Blood Pressure 4)  Flax Seed Oil 1000 Mg Caps (Flaxseed (Linseed)) .... Take 1 Capsule By Mouth Two Times A Day 5)  Aspir-Low 81 Mg Tbec (Aspirin) .... Take 1 Tablet By Mouth Two Times A Day 6)  Coreg 6.25 Mg Tabs (Carvedilol) .... Take 1 Tablet By Mouth Two Times A Day 7)  Albuterol 90 Mcg/act  Aers (Albuterol) .... Take 2 Puffs As Needed For Respiratory Difficulty Up To Every 3 Hourly. 8)  Hydrochlorothiazide 25 Mg Tabs  (Hydrochlorothiazide) .... Take 1 Tablet By Mouth Once A Day 9)  Sudafed Pe Sinus/allergy 4-10 Mg  Tabs (Chlorpheniramine-Phenylephrine) .... Take 1 Tablet By Mouth Two Times A Day 10)  Nasonex 50 Mcg/act  Susp (Mometasone Furoate) .... Apply On Spray On Each Nostril Daily. 11)  Novolin R 100 Unit/ml Soln (Insulin Regular Human) .... Inject 30 Units Subcutaneously With Each Meal 12)  Insulin Syringe 30g X 5/16" 1 Ml Misc (Insulin Syringe-Needle U-100) .... Use To Inject Insulin 13)  Truetrack Test  Strp (Glucose Blood) .... Use To Test Blood Glucsoe 3x Daily 14)  Lancets Thin  Misc (Lancets) .... Use To Test Blood Glucose 3x Daily 15)  Singulair 10 Mg Tabs (Montelukast Sodium) .... Take 1 Tablet By Mouth Once A Day 16)  Lantus 100 Unit/ml Soln (Insulin Glargine) .... Take 55 Units Daily. 17)  Xyzal 5 Mg Tabs (Levocetirizine Dihydrochloride) .... Take 1 Pill By Mouth Daily As Needed For Allergy 18)  Crestor 5 Mg Tabs (Rosuvastatin Calcium) .... Take 1 Pill By Mouth Daily. 19)  Prandin 0.5 Mg Tabs (Repaglinide) .... Take One Tablet 15 Minutes Before Meal If You Can Not Take Insulin. 20)  Vicodin 5-500 Mg Tabs (Hydrocodone-Acetaminophen) .... Take 1 Pill By Mouth Three Times A Day As Needed For Pain. 21)  Viagra 100 Mg Tabs (Sildenafil Citrate) .... Take 1/2 Pill By Mouth 1 Hour Before Sexual Activity, No More Than Once Daily. 22)  Neurontin 300 Mg Caps (Gabapentin) .... Take 1 Capsule At Bedtime Daily.  Allergies (verified): No Known Drug Allergies  Past History:  Past medical, surgical, family and social histories (including risk factors) reviewed, and no changes noted (except as noted below).  Past Medical History: Reviewed history from 03/02/2009 and no changes required. Diabetes mellitus, type II Hypertension Allergic rhinitis ED Bronchitis..chronic by chest x-ray Hyperlipidemia CAD... minivision stent   right coronary artery  2006...( other residual disease)  Myoview 2008  no  ischemia.Marland Kitcheninferolateral scar EF 50-55%... 2-D echo 2008... mild hypokinesis  base/mid inferoposterior wall head trauma.... in the past  Past Surgical History: Reviewed history from 12/19/2006 and no changes required. PTCA/stent to RCA  Family History: Reviewed history and no changes required.  Social History: Reviewed history from 03/17/2008 and no changes required. Single Works as a Administrator  Review of Systems       The patient complains of decreased hearing.  The patient denies anorexia, fever, weight loss, weight gain, vision loss,  hoarseness, chest pain, syncope, dyspnea on exertion, peripheral edema, prolonged cough, headaches, hemoptysis, abdominal pain, melena, hematochezia, severe indigestion/heartburn, hematuria, incontinence, genital sores, muscle weakness, suspicious skin lesions, transient blindness, difficulty walking, depression, unusual weight change, abnormal bleeding, enlarged lymph nodes, angioedema, and testicular masses.    Physical Exam  Additional Exam:  General: Well developed, male in no acute distress.  Vital signs reviewed.  Head: Atraumatic, normocephalic with no signs of trauma  Eyes: PERRLA, EOM intact  Ears: TM intact, cerum impaction on the right, left ear clear. Nose: Nares patent, mucosa is pink and moist, no polyps noted  Mouth: Mucosa is pink and moist, poor dention with several teeth missing.  Neck: Supple, full ROM, no thyromegaly or masses noted  Resp: Clear to ascultation bilaterally, no wheezes, rales, or rhonchi noted  CV: Regular rate and rhythm with no murmurs, rubs, or gallops noted  Abdomen: Soft, non-tender, non-distended with normal bowel sounds  Musculoskelatal: ROM full with intact strength in shoulders, elbows, and LE, no pain to palpation.  Mild numbness and weakness in the left hand in the thumb and 2nd and 3rd fingers.  mild numbness and weakness in the right 4th and 5th fingers.   Neurologic: Alert and oriented x3,  Cranial nerves II-XII grossly intact, DTR normal and symmetric  Pulses: Radial, brachial, carotid, femoral, dorsal pedis, and posterior tibial pulses equal and symmetric.  Diabetes Management Exam:    Foot Exam (with socks and/or shoes not present):       Sensory-Monofilament:          Left foot: normal          Right foot: normal   Impression & Recommendations:  Problem # 1:  DIABETES MELLITUS, TYPE II (ICD-250.00) HgBA1C is 8.2 which is a little better then last time but still outside of his goal.  He has not been taking his Prandin.  We will have him start taking his Prandin and follow his blood sugars at home and we will follow up.  I encouraged him to make sure he is eating on as regular a schedule as he can with his job.  His last Bmet and urine microalbumin were okay so we will monitor those a year after the last test result.  He is up to date on his eye exam.  His updated medication list for this problem includes:    Accupril 40 Mg Tabs (Quinapril hcl) .Marland Kitchen... Take 1 tablet by mouth once a day for blood pressure    Aspir-low 81 Mg Tbec (Aspirin) .Marland Kitchen... Take 1 tablet by mouth two times a day    Novolin R 100 Unit/ml Soln (Insulin regular human) ..... Inject 30 units subcutaneously with each meal    Lantus 100 Unit/ml Soln (Insulin glargine) .Marland Kitchen... Take 55 units daily.    Prandin 0.5 Mg Tabs (Repaglinide) .Marland Kitchen... Take one tablet 15 minutes before meal if you can not take insulin.  Orders: T- Capillary Blood Glucose (82948) T-Hgb A1C (in-house) (40981XB)  Labs Reviewed: Creat: 0.90 (01/05/2010)     Last Eye Exam: No diabetic retinopathy.   Cataract.    (10/27/2009) Reviewed HgBA1c results: 8.2 (06/08/2010)  8.5 (01/05/2010)  Problem # 2:  HYPERLIPIDEMIA, MIXED (ICD-272.2) Last FLP was in May and is within goal so we will continue to monitor him on a yearly basis.  I encouraged him to continue working on diet and exercise.    His updated medication list for this problem  includes:    Niaspan 1000 Mg Cr-tabs (Niacin (  antihyperlipidemic)) .Marland Kitchen... Take 2 tablet by mouth once a day    Crestor 5 Mg Tabs (Rosuvastatin calcium) .Marland Kitchen... Take 1 pill by mouth daily.  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  He does go to Sports medicine so I referred him back to them to get a sleeve to protect his elbow and we will continue to follow up.    Problem # 4:  CARPAL TUNNEL SYNDROME, LEFT (ICD-354.0) His numbess and tingling are mild at this time and he doesn't have any muscle atrophy so we will continue to monitor this and get him a wrist splint to wear at night and while working if he is able.    Labs Reviewed: TSH: 3.693 (09/14/2008)   HgBA1c: 8.2 (06/08/2010)  Problem # 5:  ROTATOR CUFF SYNDROME, RIGHT (ICD-726.10) This seems to bother him occasionally still.  He does still go to sports medicine occasionally for corticosteroid injections.  Otherwise he uses vicodin to control his pain approximately 1-2 per week.  No history of abuse.  We will refill his vidocin today.  Problem # 6:  CORONARY ARTERY DISEASE (ICD-414.00) He is continuing his medications for secondary prevention and has no complaints.  We will continue to monitor and adjust his medications as needed.  His updated medication list for this problem includes:    Plavix 75 Mg Tabs (Clopidogrel bisulfate) .Marland Kitchen... Take 1 tablet by mouth once a day    Accupril 40 Mg Tabs (Quinapril hcl) .Marland Kitchen... Take 1 tablet by mouth once a day for blood pressure    Aspir-low 81 Mg Tbec (Aspirin) .Marland Kitchen... Take 1 tablet by mouth two times a day    Coreg 6.25 Mg Tabs (Carvedilol) .Marland Kitchen... Take 1 tablet by mouth two times a day     Hydrochlorothiazide 25 Mg Tabs (Hydrochlorothiazide) .Marland Kitchen... Take 1 tablet by mouth once a day  Labs Reviewed: Chol: 106 (01/05/2010)   HDL: 42 (01/05/2010)   LDL: 50 (01/05/2010)   TG: 70 (01/05/2010)  Problem # 7:  HYPERTENSION (ICD-401.9) Blood pressure was a bit elevated today and is above his goal of 130/80.  We will monitor him and if he is still high at his next visit consider adjustment of his regimen likely increasing his Coreg or adding a CCB like norvasc.  His updated medication list for this problem includes:    Accupril 40 Mg Tabs (Quinapril hcl) .Marland Kitchen... Take 1 tablet by mouth once a day for blood pressure    Coreg 6.25 Mg Tabs (Carvedilol) .Marland Kitchen... Take 1 tablet by mouth two times a day    Hydrochlorothiazide 25 Mg Tabs (Hydrochlorothiazide) .Marland Kitchen... Take 1 tablet by mouth once a day  BP today: 141/80 Prior BP: 142/83 (01/30/2010)  Labs Reviewed: K+: 4.3 (01/05/2010) Creat: : 0.90 (01/05/2010)   Chol: 106 (01/05/2010)   HDL: 42 (01/05/2010)   LDL: 50 (01/05/2010)   TG: 70 (01/05/2010)  Problem # 8:  Preventive Health Care (ICD-V70.0) We discussed his preventative care needs including colonoscopy, Tdap, and Pneumovax.  He can not remember if he has had a pneumovax and doesn't remember when his last Tdap was either.  He was in a rush today so would like to defer them until his next appointment.  He  had a colonoscopy in 05/2009 which was normal and recommended a repeat in 10 years.  Upon reviewing his hospital records he received a pneumovax on 06/12/2002 which I added to the flow sheet.  He will need a repeat vaccination at the age of 89.  Complete Medication List: 1)  Plavix 75 Mg Tabs (Clopidogrel bisulfate) .... Take 1 tablet by mouth once a day 2)  Niaspan 1000 Mg Cr-tabs (Niacin (antihyperlipidemic)) .... Take 2 tablet by mouth once a day 3)  Accupril 40 Mg Tabs (Quinapril hcl) .... Take 1 tablet by mouth once a day for blood pressure 4)  Flax Seed Oil 1000 Mg Caps (Flaxseed  (linseed)) .... Take 1 capsule by mouth two times a day 5)  Aspir-low 81 Mg Tbec (Aspirin) .... Take 1 tablet by mouth two times a day 6)  Coreg 6.25 Mg Tabs (Carvedilol) .... Take 1 tablet by mouth two times a day 7)  Albuterol 90 Mcg/act Aers (Albuterol) .... Take 2 puffs as needed for respiratory difficulty up to every 3 hourly. 8)  Hydrochlorothiazide 25 Mg Tabs (Hydrochlorothiazide) .... Take 1 tablet by mouth once a day 9)  Sudafed Pe Sinus/allergy 4-10 Mg Tabs (Chlorpheniramine-phenylephrine) .... Take 1 tablet by mouth two times a day 10)  Nasonex 50 Mcg/act Susp (Mometasone furoate) .... Apply on spray on each nostril daily. 11)  Novolin R 100 Unit/ml Soln (Insulin regular human) .... Inject 30 units subcutaneously with each meal 12)  Insulin Syringe 30g X 5/16" 1 Ml Misc (Insulin syringe-needle u-100) .... Use to inject insulin 13)  Truetrack Test Strp (Glucose blood) .... Use to test blood glucsoe 3x daily 14)  Lancets Thin Misc (Lancets) .... Use to test blood glucose 3x daily 15)  Singulair 10 Mg Tabs (Montelukast sodium) .... Take 1 tablet by mouth once a day 16)  Lantus 100 Unit/ml Soln (Insulin glargine) .... Take 55 units daily. 17)  Xyzal 5 Mg Tabs (Levocetirizine dihydrochloride) .... Take 1 pill by mouth daily as needed for allergy 18)  Crestor 5 Mg Tabs (Rosuvastatin calcium) .... Take 1 pill by mouth daily. 19)  Prandin 0.5 Mg Tabs (Repaglinide) .... Take one tablet 15 minutes before meal if you can not take insulin. 20)  Vicodin 5-500 Mg Tabs (Hydrocodone-acetaminophen) .... Take 1 pill by mouth three times a day as needed for pain. 21)  Viagra 100 Mg Tabs (Sildenafil citrate) .... Take 1/2 pill by mouth 1 hour before sexual activity, no more than once daily.  Other Orders: Influenza Vaccine NON MCR (16109)  Patient Instructions: 1)  Continue taking your medications as prescribed. 2)  Restart the Prandin and make sure you test your blood sugar. 3)  Call Sports medicine  and ask for a wrist splint and elbow sleeve for helping your pain in your hands. 4)  Follow up with me in 3 months. Prescriptions: VICODIN 5-500 MG TABS (HYDROCODONE-ACETAMINOPHEN) take 1 pill by mouth three times a day as needed for pain.  #50 x 0   Entered and Authorized by:   Leodis Sias MD   Signed by:   Leodis Sias MD on 06/08/2010   Method used:   Print then Give to Patient   RxID:   [id number] PRANDIN 0.5 MG TABS (REPAGLINIDE) Take one tablet 15 minutes before meal if you can not take insulin.  #32 x 2   Entered and Authorized by:   Leodis Sias MD   Signed by:   Leodis Sias MD on 06/08/2010   Method used:  Faxed to ...       [facility] Department (retail)       [address] [place], [place]  [address]       Ph: [phone]       Fax: [phone]   RxID:   [id number] SINGULAIR 10 MG TABS (MONTELUKAST SODIUM) Take 1 tablet by mouth once a day  #30 x 2   Entered and Authorized by:   Leodis Sias MD   Signed by:   Leodis Sias MD on 06/08/2010   Method used:   Faxed to ...       [facility] Department (retail)       [address] [place], [place]  [address]       Ph: [phone]       Fax: [phone]   RxID:   [id number] NOVOLIN R 100 UNIT/ML SOLN (INSULIN REGULAR HUMAN) Inject 30 units subcutaneously with each meal  #3 10mL vial x 2   Entered and Authorized by:   Leodis Sias MD   Signed by:   Leodis Sias MD on 06/08/2010   Method used:   Faxed to ...       [facility] Department (retail)       [address] [place], [place]  [address]       Ph: [phone]       Fax: [phone]   RxID:   [id number] HYDROCHLOROTHIAZIDE 25 MG TABS (HYDROCHLOROTHIAZIDE) Take 1 tablet by mouth once a day  #90 x 3   Entered and Authorized by:   Leodis Sias MD   Signed by:   Leodis Sias MD on 06/08/2010   Method used:   Faxed to ...        [facility] Department (retail)       [address] [place], [place]  [address]       Ph: [phone]       Fax: [phone]   RxID:   [id number] ALBUTEROL 90 MCG/ACT  AERS (ALBUTEROL) take 2 puffs as needed for respiratory difficulty up to every 3 hourly.  #1 mo supply x 0   Entered and Authorized by:   Leodis Sias MD   Signed by:   Leodis Sias MD on 06/08/2010   Method used:   Faxed to ...       [facility] Department (retail)       [address] [place], [place]  [address]       Ph: [phone]       Fax: [phone]   RxID:   [id number] COREG 6.25 MG TABS (CARVEDILOL) Take 1 tablet by mouth two times a day  #60 x 1   Entered and Authorized by:   Leodis Sias MD   Signed by:   Leodis Sias MD on 06/08/2010   Method used:   Faxed to ...       [facility] Department (retail)       [address] [place], [place]  [address]       Ph: [phone]       Fax: [phone]   RxID:   [id number] NIASPAN 1000 MG CR-TABS (NIACIN (ANTIHYPERLIPIDEMIC)) Take 2 tablet by mouth once a day  #60 x 2   Entered and Authorized by:  Leodis Sias MD   Signed by:   Leodis Sias MD on 06/08/2010   Method used:   Faxed to ...       [facility] Department (retail)       [address] [place], [place]  [address]       Ph: [phone]       Fax: [phone]   RxID:   [id number] ACCUPRIL 40 MG TABS (QUINAPRIL HCL) Take 1 tablet by mouth once a day for blood pressure  #90 x 3   Entered and Authorized by:   Leodis Sias MD   Signed by:   Leodis Sias MD on 06/08/2010   Method used:   Faxed to ...       [facility] Department (retail)       [address] [place], [place]  [address]       Ph: [phone]       Fax: [phone]   RxID:   [id number]    Orders Added: 1)  T- Capillary Blood Glucose [82948] 2)  T-Hgb A1C  (in-house) [83036QW] 3)  Influenza Vaccine NON MCR [00028]   Immunization History:  Pneumovax Immunization History:    Pneumovax:  historical (06/12/2002)  Immunizations Administered:  Influenza Vaccine # 1:    Vaccine Type: Fluvax Non-MCR    Site: left deltoid    Mfr: GlaxoSmithKline    Dose: 0.5 ml    Route: IM    Given by: Stanton Kidney Ditzler RN    Exp. Date: 02/16/2011    Lot #: [id number]    VIS given: 03/13/10 version given June 08, 2010.  Flu Vaccine Consent Questions:    Do you have a history of severe allergic reactions to this vaccine? no    Any prior history of allergic reactions to egg and/or gelatin? no    Do you have a sensitivity to the preservative Thimersol? no    Do you have a past history of Guillan-Barre Syndrome? no    Do you currently have an acute febrile illness? no    Have you ever had a severe reaction to latex? no    Vaccine information given and explained to patient? yes   Immunization History:  Pneumovax Immunization History:    Pneumovax:  Historical (06/12/2002)  Immunizations Administered:  Influenza Vaccine # 1:    Vaccine Type: Fluvax Non-MCR    Site: left deltoid    Mfr: GlaxoSmithKline    Dose: 0.5 ml    Route: IM    Given by: Stanton Kidney Ditzler RN    Exp. Date: 02/16/2011    Lot #: [id number]    VIS given: 03/13/10 version given June 08, 2010.   Prevention & Chronic Care Immunizations   Influenza vaccine: Fluvax Non-MCR  (06/08/2010)   Influenza vaccine deferral: Refused  (06/08/2010)    Tetanus booster: Not documented   Td booster deferral: Deferred  (04/03/2009)    Pneumococcal vaccine: Historical  (06/12/2002)   Pneumococcal vaccine deferral: Deferred  (04/03/2009)   Pneumococcal vaccine due: 10/31/2020  Colorectal Screening   Hemoccult: Not documented   Hemoccult action/deferral: Deferred  (04/03/2009)    Colonoscopy: Results: Normal. Location:  [facility].    (06/02/2009)   Colonoscopy action/deferral:  Repeat colonoscopy in 10 years.    (06/02/2009)  Other Screening   PSA: Not documented   PSA action/deferral: Discussed-PSA declined  (06/08/2010)   Smoking status: quit  (06/08/2010)  Diabetes Mellitus  HgbA1C: 8.2  (06/08/2010)   HgbA1C action/deferral: Deferred  (04/03/2009)   Hemoglobin A1C due: 09/16/2007    Eye exam: No diabetic retinopathy.   Cataract.     (10/27/2009)   Eye exam due: 10/28/2010    Foot exam: yes  (06/08/2010)   High risk foot: No  (09/28/2008)   Foot care education: Done  (09/28/2008)   Foot exam due: 12/18/2007    Urine microalbumin/creatinine ratio: 7.0  (01/05/2010)   Urine microalbumin/cr due: 12/21/2007    Diabetes flowsheet reviewed?: Yes   Progress toward A1C goal: Improved  Lipids   Total Cholesterol: 106  (01/05/2010)   Lipid panel action/deferral: Deferred   LDL: 50  (01/05/2010)   LDL Direct: Not documented   HDL: 42  (01/05/2010)   Triglycerides: 70  (01/05/2010)   Lipid panel due: 12/19/2007    SGOT (AST): 37  (01/05/2010)   SGPT (ALT): 50  (01/05/2010)   Alkaline phosphatase: 113  (01/05/2010)   Total bilirubin: 0.6  (01/05/2010)    Lipid flowsheet reviewed?: Yes   Progress toward LDL goal: At goal  Hypertension   Last Blood Pressure: 141 / 80  (06/08/2010)   Serum creatinine: 0.90  (01/05/2010)   BMP action: Deferred   Serum potassium 4.3  (01/05/2010)    Hypertension flowsheet reviewed?: Yes   Progress toward BP goal: Unchanged  Self-Management Support :   Personal Goals (by the next clinic visit) :     Personal A1C goal: 7  (05/19/2009)     Personal blood pressure goal: 140/90  (05/19/2009)     Personal LDL goal: 70  (05/19/2009)    Patient will work on the following items until the next clinic visit to reach self-care goals:     Medications and monitoring: take my medicines every day, check my blood sugar, check my blood pressure, bring all of my medications to every visit, weigh myself weekly, examine my feet  every day  (06/08/2010)     Eating: eat foods that are low in salt, eat fruit for snacks and desserts  (06/08/2010)     Activity: take a 30 minute walk every day, take the stairs instead of the elevator, park at the far end of the parking lot  (06/08/2010)    Diabetes self-management support: Written self-care plan, Education handout, Resources for patients handout  (06/08/2010)   Diabetes care plan printed   Diabetes education handout printed   Last diabetes self-management training by diabetes educator: 03/03/2009    Hypertension self-management support: Written self-care plan, Education handout, Resources for patients handout  (06/08/2010)   Hypertension self-care plan printed.   Hypertension education handout printed    Lipid self-management support: Written self-care plan, Education handout, Resources for patients handout  (06/08/2010)   Lipid self-care plan printed.   Lipid education handout printed      Resource handout printed.   Last LDL:                                                 50 (01/05/2010 8:44:00 PM)              Diabetic Foot Exam Foot Inspection Is there a history of a foot ulcer?              No Is there a foot ulcer now?  No Can the patient see the bottom of their feet?          Yes Are the shoes appropriate in style and fit?          Yes Is there swelling or an abnormal foot shape?          No Are the toenails long?                Yes Are the toenails thick?                No Are the toenails ingrown?              No Is there heavy callous build-up?              Yes Is there a claw toe deformity?                          No Is there elevated skin temperature?            No Is there limited ankle dorsiflexion?            No Is there foot or ankle muscle weakness?            No Do you have pain in calf while walking?           No         10-g (5.07) Semmes-Weinstein Monofilament Test Performed by: Stanton Kidney Ditzler RN          Right  Foot          Left Foot Visual Inspection     normal         normal Test Control      normal         normal Site 1         normal         normal Site 2         normal         normal Site 3         normal         normal Site 4         normal         normal Site 5         normal         normal Site 6         normal         normal Site 7         normal         normal Site 8         normal         normal Site 9         normal         normal Site 10         normal         normal  Impression      normal         normal     Laboratory Results   Blood Tests   Date/Time Received: June 08, 2010 2:49 PM Date/Time Reported: Alric Quan  June 08, 2010 2:49 PM    HGBA1C: 8.2%   (Normal Range: Non-Diabetic - 3-6%   Control Diabetic - 6-8%) CBG Fasting:: 112mg /dL     Appended Document: est-ck/fu/meds/cfb I discussed Mr Santoli with Dr Tonny Branch and I agree with his note and A/P  as outlined above.

## 2010-09-18 NOTE — Progress Notes (Signed)
Summary: diabetes support/dmr  Phone Note Call from Patient Call back at Home Phone [phone]   Caller: Patient Action Taken: Provider Notified, Appt Scheduled Details for Reason: talk to CDE Details of Complaint: CBGs Summary of Call: Patient called to discuss that he had run out of Novolog and began using regular insulin (outdated) and his blood sugars are much better. Discussed that regular often works better for those with slow stomach emptying and /or hgiher fat diets. patient reports that he has both and is findign that regualr is working better and also wants to switch from lantus to NPH also. I suggested that pateint try lantus an dregular for a month or two and if he is still not happy with his blood sugars, then consider at that time NPH an dRegular. Discussed advantages of lantus over NPH- once a day dosing, more consistency and accuracy with dosing. Patient ot see physician tomorrow and also requests an appoinment with CDE. Have scheduled appointment for tomorrow.

## 2010-09-18 NOTE — Progress Notes (Signed)
Summary: refill/gg  Phone Note Refill Request  on November 23, 2009 5:38 PM  Refills Requested: Medication #1:  COREG 6.25 MG TABS Take 1 tablet by mouth two times a day   Last Refilled: 09/11/2009  Method Requested: Fax to Local Pharmacy Initial call taken by: Merrie Roof RN,  November 23, 2009 5:39 PM  Follow-up for Phone Call       Follow-up by: Jason Coop MD,  November 24, 2009 12:25 PM    Prescriptions: COREG 6.25 MG TABS (CARVEDILOL) Take 1 tablet by mouth two times a day  #60 x 2   Entered and Authorized by:   Jason Coop MD   Signed by:   Jason Coop MD on 11/24/2009   Method used:   Telephoned to ...       [facility] (retail)       [address] [place], [place]  [address]       Ph: [phone]       Fax: [phone]   RxID:   [id number]

## 2010-09-18 NOTE — Assessment & Plan Note (Signed)
Summary: FU VISIT/DS   Vital Signs:  Patient profile:   55 year old male Height:      72 inches (182.88 cm) Weight:      227.7 pounds (103.50 kg) BMI:     30.99 Temp:     97.1 degrees F (36.17 degrees C) oral Pulse rate:   81 / minute BP sitting:   110 / 69  (right arm) Cuff size:   large  Vitals Entered By: Krystal Eaton (Duncan Dull) (May 19, 2009 4:00 PM) CC: ROUTINE F/U, PROBLEMS WITH CPAP MACHINE Is Patient Diabetic? Yes  Nutritional Status BMI of > 30 = obese CBG Result 206  Have you ever been in a relationship where you felt threatened, hurt or afraid?No   Does patient need assistance? Functional Status Self care Ambulation Normal  Flu Vaccine Consent Questions     Do you have a history of severe allergic reactions to this vaccine? no    Any prior history of allergic reactions to egg and/or gelatin? no    Do you have a sensitivity to the preservative Thimersol? no    Do you have a past history of Guillan-Barre Syndrome? no    Do you currently have an acute febrile illness? no    Have you ever had a severe reaction to latex? no    Vaccine information given and explained to patient? yes    Are you currently pregnant? no    Lot Number:[id number]   Exp Date:11/16/2009   Site Given  Left Deltoid IM.Krystal Eaton (AAMA)  May 19, 2009 4:25 PM   Primary Care Provider:  Tacey Ruiz MD  CC:  ROUTINE F/U and PROBLEMS WITH CPAP MACHINE.  History of Present Illness: Tyler Wilkinson is a 55 yo man with PMH as outlined in the EMR comes today for a f/u visit.   1. DM:  He's still not checking any CBG at work. He has starting at work. He states he is not running lows now. Pt is taking lantus 60 instead of 50. He's not taking prandil. He is taking 30-35 units before meal. I looked at pts' CBG and it runs from 44-467, but usually is less than 200. He had 9 low readings and he states he has hypoglycemic symptoms only once. Most of his low readings are in AM. Also most of his high  readings are in AM as well. Pt admits that he doesn't eat consistently in terms of timing and amount because " I am  not a consistent person".   2. HL: Pt started to take crestor as we planned on last visit. He took it on alternate days for 2 wks and then daily for past few days. No muscle pain.   3. Shoulder pain: It's getting better except that he used it a lot. He is no longer seeing Dr. Jennette Kettle regularly.   4. HTN: He is taking his medicaions regularly.   5. CAD: No CP or SOB.   6. OSA: He just started using CPAP. See cough.   7. Screening: He has an appt with his colonoscopy. He is aware that he needs to hold plavix for 5 days prior to the procedure but will continue ASA.   8. Cough: He has dry cough since yesterday. This is after using CPAP machine. After he took the CPAP machine he improved. This was the first time he was using it. Tyler Wilkinson set it up himself after people from [facility] instructed him how to do it.  Preventive Screening-Counseling & Management  Alcohol-Tobacco     Smoking Status: quit     Year Quit: only at age 34     Passive Smoke Exposure: no  Current Medications (verified): 1)  Plavix 75 Mg Tabs (Clopidogrel Bisulfate) .... Take 1 Tablet By Mouth Once A Day 2)  Niaspan 1000 Mg Cr-Tabs (Niacin (Antihyperlipidemic)) .... Take 2 Tablet By Mouth Once A Day 3)  Accupril 40 Mg Tabs (Quinapril Hcl) .... Take 1 Tablet By Mouth Once A Day For Blood Pressure 4)  Viagra 100 Mg Tabs (Sildenafil Citrate) .... As Needed 5)  Flax Seed Oil 1000 Mg Caps (Flaxseed (Linseed)) .... Take 1 Capsule By Mouth Two Times A Day 6)  Aspir-Low 81 Mg Tbec (Aspirin) .... Take 1 Tablet By Mouth Two Times A Day 7)  Coreg 6.25 Mg Tabs (Carvedilol) .... Take 1 Tablet By Mouth Two Times A Day 8)  Albuterol 90 Mcg/act  Aers (Albuterol) .... Take 2 Puffs As Needed For Respiratory Difficulty Up To Every 3 Hourly. 9)  Hydrochlorothiazide 25 Mg Tabs (Hydrochlorothiazide) .... Take 1 Tablet By  Mouth Once A Day 10)  Sudafed Pe Sinus/allergy 4-10 Mg  Tabs (Chlorpheniramine-Phenylephrine) .... Take 1 Tablet By Mouth Two Times A Day 11)  Nasonex 50 Mcg/act  Susp (Mometasone Furoate) .... Apply On Spray On Each Nostril Daily. 12)  Novolin R 100 Unit/ml Soln (Insulin Regular Human) .... Inject 30 Units Subcutaneously With Each Meal 13)  Insulin Syringe 30g X 5/16" 1 Ml Misc (Insulin Syringe-Needle U-100) .... Use To Inject Insulin 14)  Truetrack Test  Strp (Glucose Blood) .... Use To Test Blood Glucsoe 3x Daily 15)  Lancets Thin  Misc (Lancets) .... Use To Test Blood Glucose 3x Daily 16)  Singulair 10 Mg Tabs (Montelukast Sodium) .... Take 1 Tablet By Mouth Once A Day 17)  Lantus 100 Unit/ml Soln (Insulin Glargine) .... Take 50 Units Daily. 18)  Allegra 180 Mg Tabs (Fexofenadine Hcl) .... Take 1 Pill By Mouth Daily As Needed For Allergy 19)  Crestor 5 Mg Tabs (Rosuvastatin Calcium) .... Take 1 Pill By Mouth Daily. 20)  Prandin 0.5 Mg Tabs (Repaglinide) .... Take One Tablet 15 Minutes Before Meal If You Can Not Take Insulin. 21)  Vicodin 5-500 Mg Tabs (Hydrocodone-Acetaminophen) .... Take 1 Pill By Mouth Three Times A Day As Needed For Pain.  Allergies: No Known Drug Allergies  Review of Systems      See HPI  Physical Exam  General:  alert.   Mouth:  pharynx pink and moist.   Lungs:  normal breath sounds.   Heart:  normal rate, regular rhythm, no murmur, no gallop, and no rub.   Extremities:  trace left pedal edema and trace right pedal edema.   Neurologic:  alert & oriented X3.     Impression & Recommendations:  Problem # 1:  ROTATOR CUFF SYNDROME, RIGHT (ICD-726.10) Getting better. Pt asks for some vicodin to be  used as needed for shoulder pain.   Problem # 2:  SLEEP APNEA (ICD-780.57) Pt just started using CPAP machine. He had some cough a/w first time use. I asked him to call [facility] to help him for proper set up.   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 Will continue his medication.  His updated medication list for this problem includes:    Plavix 75 Mg Tabs (Clopidogrel bisulfate) .Marland Kitchen... Take 1 tablet by mouth once a day  Accupril 40 Mg Tabs (Quinapril hcl) .Marland Kitchen... Take 1 tablet by mouth once a day for blood pressure    Aspir-low 81 Mg Tbec (Aspirin) .Marland Kitchen... Take 1 tablet by mouth two times a day    Coreg 6.25 Mg Tabs (Carvedilol) .Marland Kitchen... Take 1 tablet by mouth two times a day    Hydrochlorothiazide 25 Mg Tabs (Hydrochlorothiazide) .Marland Kitchen... Take 1 tablet by mouth once a day  Problem # 4:  HYPERLIPIDEMIA, MIXED (ICD-272.2) Pt is taking both niaspan and crestor. Will check followings. He denies any muscle aches.  His updated medication list for this problem includes:    Niaspan 1000 Mg Cr-tabs (Niacin (antihyperlipidemic)) .Marland Kitchen... Take 2 tablet by mouth once a day    Crestor 5 Mg Tabs (Rosuvastatin calcium) .Marland Kitchen... Take 1 pill by mouth daily.  Orders: T-Lipid Profile [id number]) T-Comprehensive Metabolic Panel [id number])  Problem # 5:  HYPERTENSION (ICD-401.9) BP great. Will continue same. Expect better control in the future with the use of CPAP.  His updated medication list for this problem includes:    Accupril 40 Mg Tabs (Quinapril hcl) .Marland Kitchen... Take 1 tablet by mouth once a day for blood pressure    Coreg 6.25 Mg Tabs (Carvedilol) .Marland Kitchen... Take 1 tablet by mouth two times a day    Hydrochlorothiazide 25 Mg Tabs (Hydrochlorothiazide) .Marland Kitchen... Take 1 tablet by mouth once a day  BP today: 110/69 Prior BP: 128/81 (04/03/2009)  Labs Reviewed: K+: 4.1 (12/28/2008) Creat: : 0.90 (12/28/2008)   Chol: 140 (11/15/2008)   HDL: 42 (11/15/2008)   LDL: 83 (11/15/2008)   TG: 77 (11/15/2008)  Problem # 6:  DIABETES MELLITUS, TYPE II (ICD-250.00) Please see HPI. Pt has many low CBG readings. He is taking lantus 60 instead of 50 as he believes he was running v. high with 50. He has 1episode of symptomatic  hypoglycemia. Pt works night shift and his timing of meal is very erratic: see note from my prior visit. He endorses that he doesn't eat consistently in terms of timing and amount. Both his low and high readings are in AM. His A1c is 7.3 down from 7.9, but with many low readings. He hasn''t still gotten his prandin yet: he states it is on its way to reach him. Plan-- I think first of all we need more commitment from him, esp in terms of consistent eating habits. I had a v. long discussion with him regarding this. He expresses understanding that without consistent eating habit and also without checking CBG at work it will be difficult to get a uniform reading and therefore to control it well. Pt will try to be more consistent and come back in  2 wks with his meter. Hopefully he will have prandin by then and we can also control his meal surge.      Accupril 40 Mg Tabs (Quinapril hcl) .Marland Kitchen... Take 1 tablet by mouth once a day for blood pressure    Aspir-low 81 Mg Tbec (Aspirin) .Marland Kitchen... Take 1 tablet by mouth two times a day    Novolin R 100 Unit/ml Soln (Insulin regular human) ..... Inject 30 units subcutaneously with each meal    Lantus 100 Unit/ml Soln (Insulin glargine) .Marland Kitchen... Take 50 units daily.    Prandin 0.5 Mg Tabs (Repaglinide) .Marland Kitchen... Take one tablet 15 minutes before meal if you can not take insulin.  Orders: T-Hgb A1C (in-house) ([id number]) T- Capillary Blood Glucose (50093) T-Urine Microalbumin w/creat. ratio [id number])  Labs Reviewed: Creat: 0.90 (12/28/2008)     Last Eye  Exam: No diabetic retinopathy.   Visual acuity OD (best corrected):     20/25 Visual acuity OS (best corrected):     20/30 Intraocular pressure OD:     22 Intraocular pressure OS:     22  (11/02/2008) Reviewed HgBA1c results: 7.3 (05/19/2009)  7.9 (02/24/2009)  Complete Medication List: 1)  Plavix 75 Mg Tabs (Clopidogrel bisulfate) .... Take 1 tablet by mouth once a day 2)  Niaspan 1000 Mg Cr-tabs (Niacin  (antihyperlipidemic)) .... Take 2 tablet by mouth once a day 3)  Accupril 40 Mg Tabs (Quinapril hcl) .... Take 1 tablet by mouth once a day for blood pressure 4)  Viagra 100 Mg Tabs (Sildenafil citrate) .... As needed 5)  Flax Seed Oil 1000 Mg Caps (Flaxseed (linseed)) .... Take 1 capsule by mouth two times a day 6)  Aspir-low 81 Mg Tbec (Aspirin) .... Take 1 tablet by mouth two times a day 7)  Coreg 6.25 Mg Tabs (Carvedilol) .... Take 1 tablet by mouth two times a day 8)  Albuterol 90 Mcg/act Aers (Albuterol) .... Take 2 puffs as needed for respiratory difficulty up to every 3 hourly. 9)  Hydrochlorothiazide 25 Mg Tabs (Hydrochlorothiazide) .... Take 1 tablet by mouth once a day 10)  Sudafed Pe Sinus/allergy 4-10 Mg Tabs (Chlorpheniramine-phenylephrine) .... Take 1 tablet by mouth two times a day 11)  Nasonex 50 Mcg/act Susp (Mometasone furoate) .... Apply on spray on each nostril daily. 12)  Novolin R 100 Unit/ml Soln (Insulin regular human) .... Inject 30 units subcutaneously with each meal 13)  Insulin Syringe 30g X 5/16" 1 Ml Misc (Insulin syringe-needle u-100) .... Use to inject insulin 14)  Truetrack Test Strp (Glucose blood) .... Use to test blood glucsoe 3x daily 15)  Lancets Thin Misc (Lancets) .... Use to test blood glucose 3x daily 16)  Singulair 10 Mg Tabs (Montelukast sodium) .... Take 1 tablet by mouth once a day 17)  Lantus 100 Unit/ml Soln (Insulin glargine) .... Take 50 units daily. 18)  Allegra 180 Mg Tabs (Fexofenadine hcl) .... Take 1 pill by mouth daily as needed for allergy 19)  Crestor 5 Mg Tabs (Rosuvastatin calcium) .... Take 1 pill by mouth daily. 20)  Prandin 0.5 Mg Tabs (Repaglinide) .... Take one tablet 15 minutes before meal if you can not take insulin. 21)  Vicodin 5-500 Mg Tabs (Hydrocodone-acetaminophen) .... Take 1 pill by mouth three times a day as needed for pain.  Other Orders: Admin 1st Vaccine (16109) Flu Vaccine 5yrs + (60454)  Patient  Instructions: 1)  Please schedule a follow-up appointment in 2 weeks. 2)  Limit your Sodium (Salt). 3)  Limit your Sodium (Salt) to less than 2 grams a day(slightly less than 1/2 a teaspoon) to prevent fluid retention, swelling, or worsening of symptoms. 4)  It is important that you exercise regularly at least 20 minutes 5 times a week. If you develop chest pain, have severe difficulty breathing, or feel very tired , stop exercising immediately and seek medical attention. 5)  You need to lose weight. Consider a lower calorie diet and regular exercise.  6)  Check your blood sugars regularly. If your readings are usually above : or below 70 you should contact our office. 7)  It is important that your Diabetic A1c level is checked every 3 months. 8)  See your eye doctor yearly to check for diabetic eye damage. Prescriptions: VICODIN 5-500 MG TABS (HYDROCODONE-ACETAMINOPHEN) take 1 pill by mouth three times a day as needed for  pain.  #30 x 0   Entered and Authorized by:   Jason Coop MD   Signed by:   Jason Coop MD on 05/19/2009   Method used:   Print then Give to Patient   RxID:   [id number]  Process Orders Check Orders Results:     [facility]: ABN not required for this insurance Tests Sent for requisitioning (May 19, 2009 9:43 PM):     05/19/2009: [facility] -- T-Lipid Profile [id number] (signed)     05/19/2009: [facility] -- T-Comprehensive Metabolic Panel [id number] (signed)     05/19/2009: [facility] -- T-Urine Microalbumin w/creat. ratio [[id number]] (signed)    Laboratory Results   Blood Tests   Date/Time Received: May 19, 2009 4:18 PM. Date/Time Reported: Alric Quan  May 19, 2009 4:18 PM   HGBA1C: 7.3%   (Normal Range: Non-Diabetic - 3-6%   Control Diabetic - 6-8%) CBG Random:: 206mg /dL      Prevention & Chronic Care Immunizations   Influenza vaccine:  Fluvax 3+  (05/19/2009)   Influenza vaccine deferral: Deferred  (04/03/2009)    Tetanus booster: Not documented   Td booster deferral: Deferred  (04/03/2009)    Pneumococcal vaccine: Not documented   Pneumococcal vaccine deferral: Deferred  (04/03/2009)  Colorectal Screening   Hemoccult: Not documented   Hemoccult action/deferral: Deferred  (04/03/2009)    Colonoscopy: Not documented   Colonoscopy action/deferral: Deferred  (04/03/2009)  Other Screening   PSA: Not documented   PSA action/deferral: Discussion deferred  (04/03/2009)   Smoking status: quit  (05/19/2009)  Diabetes Mellitus   HgbA1C: 7.3  (05/19/2009)   HgbA1C action/deferral: Deferred  (04/03/2009)   Hemoglobin A1C due: 09/16/2007    Eye exam: No diabetic retinopathy.   Visual acuity OD (best corrected):     20/25 Visual acuity OS (best corrected):     20/30 Intraocular pressure OD:     22 Intraocular pressure OS:     22   (11/02/2008)   Eye exam due: 11/2009    Foot exam: yes  (09/28/2008)   High risk foot: No  (09/28/2008)   Foot care education: Done  (09/28/2008)   Foot exam due: 12/18/2007    Urine microalbumin/creatinine ratio: 3.2  (04/08/2008)   Urine microalbumin/cr due: 12/21/2007    Diabetes flowsheet reviewed?: Yes   Progress toward A1C goal: Unchanged  Lipids   Total Cholesterol: 140  (11/15/2008)   Lipid panel action/deferral: Deferred   LDL: 83  (11/15/2008)   LDL Direct: Not documented   HDL: 42  (11/15/2008)   Triglycerides: 77  (11/15/2008)   Lipid panel due: 12/19/2007    SGOT (AST): 36  (11/15/2008)   SGPT (ALT): 45  (11/15/2008) CMP ordered    Alkaline phosphatase: 100  (11/15/2008)   Total bilirubin: 0.5  (11/15/2008)    Lipid flowsheet reviewed?: Yes   Progress toward LDL goal: Unchanged  Hypertension   Last Blood Pressure: 110 / 69  (05/19/2009)   Serum creatinine: 0.90  (12/28/2008)   BMP action: Deferred   Serum potassium 4.1  (12/28/2008) CMP ordered      Hypertension flowsheet reviewed?: Yes   Progress toward BP goal: At goal  Self-Management Support :   Personal Goals (by the next clinic visit) :     Personal A1C goal: 7  (05/19/2009)     Personal blood pressure goal: 140/90  (05/19/2009)     Personal LDL goal: 70  (05/19/2009)    Diabetes self-management  support: Written self-care plan  (05/19/2009)   Diabetes care plan printed   Last diabetes self-management training by diabetes educator: 03/03/2009    Hypertension self-management support: Written self-care plan  (05/19/2009)   Hypertension self-care plan printed.    Lipid self-management support: Written self-care plan  (05/19/2009)   Lipid self-care plan printed.  Process Orders Check Orders Results:     [facility]: ABN not required for this insurance Tests Sent for requisitioning (May 19, 2009 9:43 PM):     05/19/2009: [facility] -- T-Lipid Profile [id number] (signed)     05/19/2009: [facility] -- T-Comprehensive Metabolic Panel [id number] (signed)     05/19/2009: [facility] -- T-Urine Microalbumin w/creat. ratio [[id number]] (signed)

## 2010-09-18 NOTE — Assessment & Plan Note (Signed)
Summary: diabetes/dmr              Is Patient Diabetic? Yes  CBG Result 128 CBG Device ID 6 Comments repeat CBG after 6 gl;ucose tablets above. Pt reportedly took 20 units Novolog & 60 units of lantus  ~ 1:30 pM, ( he usually stops to grab food, but didn't today)      Current Allergies: No known allergies  Pt requested this visit today.             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 Affect: Appropriate Learns best by: Hearing and doing  Coping with Diabetes What bothers you  most about dealing with your diabetes? injections    Monitoring Self monitoring blood glucose 3 times a day Measures urine ketones? No Name of Meter  6 Wears Medical I.D. No   Carrys Food for Low Blood sugar No   Can you tell if your blood sugar is low? Yes      Nutrition assessment Weight change: Gain Amount of change: 5# How many meals per week do you eat away from home?   6 or more If you eat away from home , what type of restaurant do you choose?  Fast food Who does the food shopping? You Who does the cooking?  You Do you read food labels? If so what do you look at?  no, no desire  Biggest challenge to eating healthy: Eating too much  Activity Limitations  Inadequate physical activity  Type of physical activity  Work  Monitoring Perform glucose monitoring/ketone testing and record results correctly: Financial controller target blood glucose and HgbA1C goals: Demonstrates competency  Complications State the causes-signs and symptoms and prevention of Hyperglycemia: Demonstrates competency Explain proper treatment of hyperglycemia: Demonstrates competency State the causes- signs and symptoms and prevention of hypoglycemia: Demonstrates competency Explain proper treatment of hypoglycemia: Demonstrates competency State the relationship  between blood glucose control and the development/prevention of long-term complications: Demonstrates competency State benefits-risks-and options for improving blood sugar control: Demonstrates competency State the relationship between blood pressure and lipid control in the prevention/control of cardiovascular disease: Demonstrates competency Date of Diabetic Education: 04/24/2007    BEHAVIORAL GOALS INITIAL Utilizing medications if for therapeutic effectiveness: try to get back on glucophage at least 500 mg twice daily, once tolerating this, increase it to 1000 mg in the am and 500 mg in the pM Monitoring blood glucose levels daily:  record your blood sugars in a log book        Dislikes injections because they make him feel "sick".  wants to decrease weight, but weight is increasing.   Consider decreasing basal insulin and adding an oral hypoglycemic agent. Question if pt may be a good candidate for [place] since it is once daily and covers basal and prandial. Could also consider prandin three times a day with meals instead of Novolog as pt  had normal c-peptide in the last 1-2 years.  CBGs per his True Track meter:  14 day average: 30 day average:         ~ 12-1 midnight                      4-6 am                           ~  1-3 PM 9/5          119                                 297                                   142 9/4          286                                                                          227 9/3      191/183                               289                                  247 9/2                                                  430                                  104 9/1          272                                                                           255 8/31        278                                                                           250 8/30                                               289                                    150 8/29  293                                      95 8/28                                                                                          121       Last LDL:                                                 92 (12/19/2006 9:54:00 PM)

## 2010-09-18 NOTE — Progress Notes (Signed)
Summary: med refill/gp  Phone Note Refill Request Message from:  Fax from Pharmacy on August 25, 2009 11:06 AM  Refills Requested: Medication #1:  LANTUS 100 UNIT/ML SOLN take 50 units daily.   Last Refilled: 06/14/2009  Method Requested: Fax to Local Pharmacy Initial call taken by: Chinita Pester RN,  August 25, 2009 11:06 AM  Follow-up for Phone Call       Follow-up by: Jason Coop MD,  August 25, 2009 7:10 PM    Prescriptions: LANTUS 100 UNIT/ML SOLN (INSULIN GLARGINE) take 50 units daily.  #1 vial x 11   Entered and Authorized by:   Jason Coop MD   Signed by:   Jason Coop MD on 08/25/2009   Method used:   Faxed to ...       [facility] (retail)       [address] [place], [place]  [address]       Ph: [phone]       Fax: [phone]   RxID:   [id number]

## 2010-09-18 NOTE — Assessment & Plan Note (Signed)
Summary: EAR PAIN [MKJ]   Vital Signs:  Patient profile:   55 year old male Height:      72 inches (182.88 cm) Weight:      229.3 pounds (104.23 kg) BMI:     31.21 Temp:     96.0 degrees F (35.56 degrees C) oral Pulse rate:   87 / minute BP sitting:   114 / 71  (right arm)  Vitals Entered By: Stanton Kidney Ditzler RN (September 28, 2009 3:50 PM) Is Patient Diabetic? Yes Did you bring your meter with you today? No Pain Assessment Patient in pain? yes     Location: right shoulder Intensity: 7-8 Type: throbbing Onset of pain  years Nutritional Status BMI of > 30 = obese Nutritional Status Detail appetite down CBG Result 276  Have you ever been in a relationship where you felt threatened, hurt or afraid?denies   Does patient need assistance? Functional Status Self care Ambulation Normal Comments Problems with sinus and both ears. Both hands hurts.   Primary Care Provider:  Jason Coop MD   History of Present Illness: Tyler Wilkinson is a 55 yo man with PMH as outlined in the EMR comes today for a f/u visit.   1. DM: He checks CBG two times a day. He didn't bring his meter. It's running like 200 on average. He had one episode when he was disoriented but he was with his buddy. But he didn't check his CBG. He also had 3 additional episodes when he was sweating, shakiness etc. He takes lantus 50 units and novolog 30 units on average before each meal. He also takes prandin. She last saw D. Victory Dakin was few months ago. He saw eye MD 4/10. He is seeing him again on 3/10.   2. HTN: He is taking his meds without any problem.   3. OSA: He is not wearing CPAP because it was making him sick at a time and he is working on getting a new mask.   4. HL: He didn't bring his medications.   5. Shoulder pain: He had a shot from Dr. Jennette Kettle. He occasionally takes vicodin. He is still hurting his left shoulder and can hardly raise his left arm above his head.   Depression History:      The patient  denies a depressed mood most of the day and a diminished interest in his usual daily activities.         Preventive Screening-Counseling & Management  Alcohol-Tobacco     Smoking Status: quit     Year Quit: only at age 55     Passive Smoke Exposure: no  Caffeine-Diet-Exercise     Does Patient Exercise: yes     Type of exercise: walking     Times/week: 2  Current Medications (verified): 1)  Plavix 75 Mg Tabs (Clopidogrel Bisulfate) .... Take 1 Tablet By Mouth Once A Day 2)  Niaspan 1000 Mg Cr-Tabs (Niacin (Antihyperlipidemic)) .... Take 2 Tablet By Mouth Once A Day 3)  Accupril 40 Mg Tabs (Quinapril Hcl) .... Take 1 Tablet By Mouth Once A Day For Blood Pressure 4)  Flax Seed Oil 1000 Mg Caps (Flaxseed (Linseed)) .... Take 1 Capsule By Mouth Two Times A Day 5)  Aspir-Low 81 Mg Tbec (Aspirin) .... Take 1 Tablet By Mouth Two Times A Day 6)  Coreg 6.25 Mg Tabs (Carvedilol) .... Take 1 Tablet By Mouth Two Times A Day 7)  Albuterol 90 Mcg/act  Aers (Albuterol) .... Take 2 Puffs As Needed For  Respiratory Difficulty Up To Every 3 Hourly. 8)  Hydrochlorothiazide 25 Mg Tabs (Hydrochlorothiazide) .... Take 1 Tablet By Mouth Once A Day 9)  Sudafed Pe Sinus/allergy 4-10 Mg  Tabs (Chlorpheniramine-Phenylephrine) .... Take 1 Tablet By Mouth Two Times A Day 10)  Nasonex 50 Mcg/act  Susp (Mometasone Furoate) .... Apply On Spray On Each Nostril Daily. 11)  Novolin R 100 Unit/ml Soln (Insulin Regular Human) .... Inject 30 Units Subcutaneously With Each Meal 12)  Insulin Syringe 30g X 5/16" 1 Ml Misc (Insulin Syringe-Needle U-100) .... Use To Inject Insulin 13)  Truetrack Test  Strp (Glucose Blood) .... Use To Test Blood Glucsoe 3x Daily 14)  Lancets Thin  Misc (Lancets) .... Use To Test Blood Glucose 3x Daily 15)  Singulair 10 Mg Tabs (Montelukast Sodium) .... Take 1 Tablet By Mouth Once A Day 16)  Lantus 100 Unit/ml Soln (Insulin Glargine) .... Take 50 Units Daily. 17)  Allegra 180 Mg Tabs (Fexofenadine  Hcl) .... Take 1 Pill By Mouth Daily As Needed For Allergy 18)  Crestor 5 Mg Tabs (Rosuvastatin Calcium) .... Take 1 Pill By Mouth Daily. 19)  Prandin 0.5 Mg Tabs (Repaglinide) .... Take One Tablet 15 Minutes Before Meal If You Can Not Take Insulin. 20)  Vicodin 5-500 Mg Tabs (Hydrocodone-Acetaminophen) .... Take 1 Pill By Mouth Three Times A Day As Needed For Pain. 21)  Cialis 20 Mg Tabs (Tadalafil) .... Take One Pill Daily As Needed 30 Minutes Before Sexual Activity. 22)  Neurontin 300 Mg Caps (Gabapentin) .... Take 1 Capsule At Bedtime Daily.  Allergies: No Known Drug Allergies  Review of Systems      See HPI  Physical Exam  General:  alert.   Mouth:  pharynx pink and moist.   Lungs:  normal breath sounds, no crackles, and no wheezes.   Heart:  normal rate, regular rhythm, no murmur, and no gallop.   Abdomen:  soft and non-tender.   Msk:  Right Shoulder: Abduction is limited to 170 degree and is painful after that.  Extremities:  trace left pedal edema and trace right pedal edema.   Neurologic:  alert & oriented X3.     Impression & Recommendations:  Problem # 1:  ROTATOR CUFF SYNDROME, RIGHT (ICD-726.10) Pt has persistent pain on his right shoulder and has limited ROM of right shoulder. I gave him some vicodin and asked him to f/u with sports medicine.   Problem # 2:  SLEEP APNEA (ICD-780.57) We spoke at length regarding sleep apnea and the consequences of not treating it. He is not wearing CPAP at now and he states he will start using it soon.   Problem # 3:  ERECTILE DYSFUNCTION, ORGANIC (ICD-607.84) Pt wants dose of cialis to be increased to 20 from 10 and he will take half a pill of 20 mg as this is cheaper.  His updated medication list for this problem includes:    Cialis 20 Mg Tabs (Tadalafil) .Marland Kitchen... Take one pill daily as needed 30 minutes before sexual activity.  Problem # 4:  HYPERLIPIDEMIA, MIXED (ICD-272.2) Cont. same for now.  His updated medication list for  this problem includes:    Niaspan 1000 Mg Cr-tabs (Niacin (antihyperlipidemic)) .Marland Kitchen... Take 2 tablet by mouth once a day    Crestor 5 Mg Tabs (Rosuvastatin calcium) .Marland Kitchen... Take 1 pill by mouth daily.  Labs Reviewed: SGOT: 57 (05/19/2009)   SGPT: 72 (05/19/2009)   HDL:32 (05/19/2009), 42 (11/15/2008)  LDL:43 (05/19/2009), 83 (11/15/2008)  Chol:88 (05/19/2009), 140 (11/15/2008)  Trig:66 (05/19/2009), 77 (11/15/2008)  Problem # 5:  HYPERTENSION (ICD-401.9) BP great with following regimen. Cont same and check CMET.  His updated medication list for this problem includes:    Accupril 40 Mg Tabs (Quinapril hcl) .Marland Kitchen... Take 1 tablet by mouth once a day for blood pressure    Coreg 6.25 Mg Tabs (Carvedilol) .Marland Kitchen... Take 1 tablet by mouth two times a day    Hydrochlorothiazide 25 Mg Tabs (Hydrochlorothiazide) .Marland Kitchen... Take 1 tablet by mouth once a day  Orders: T-Comprehensive Metabolic Panel ([id number])  BP today: 114/71 Prior BP: 129/73 (07/25/2009)  Labs Reviewed: K+: 4.0 (05/19/2009) Creat: : 0.99 (05/19/2009)   Chol: 88 (05/19/2009)   HDL: 32 (05/19/2009)   LDL: 43 (05/19/2009)   TG: 66 (05/19/2009)  Problem # 6:  DIABETES MELLITUS, TYPE II (ICD-250.00) Pt usu runs in 200 and he had 4 episode of symptomatic hypoglycemia. He uses following medications. My plan is to decrease the dose of lantus by a few units and f/u in 2 wks. But Tyler Wilkinson states he doesn't want to decrease the lantus and follow closely for any low blood sugar and any symptoms resulting from it. If he has even 1 episode of symptomatic hypoglycemia, he is urged to report to [place] and we will decrease the dose of lantus. He didn't bring his meter today. He will check his CBG at least three times a day and bring his meter in 2 wks.  His updated medication list for this problem includes:    Accupril 40 Mg Tabs (Quinapril hcl) .Marland Kitchen... Take 1 tablet by mouth once a day for blood pressure    Aspir-low 81 Mg Tbec (Aspirin) .Marland Kitchen... Take 1 tablet by  mouth two times a day    Novolin R 100 Unit/ml Soln (Insulin regular human) ..... Inject 30 units subcutaneously with each meal    Lantus 100 Unit/ml Soln (Insulin glargine) .Marland Kitchen... Take 50 units daily.    Prandin 0.5 Mg Tabs (Repaglinide) .Marland Kitchen... Take one tablet 15 minutes before meal if you can not take insulin.  Orders: T- Capillary Blood Glucose ([id number]) T-Hgb A1C (in-house) ([id number]) T-Comprehensive Metabolic Panel [phone])  Labs Reviewed: Creat: 0.99 (05/19/2009)     Last Eye Exam: No diabetic retinopathy.   Visual acuity OD (best corrected):     20/25 Visual acuity OS (best corrected):     20/30 Intraocular pressure OD:     22 Intraocular pressure OS:     22  (11/02/2008) Reviewed HgBA1c results: 8.2 (09/28/2009)  7.3 (05/19/2009)  Complete Medication List: 1)  Plavix 75 Mg Tabs (Clopidogrel bisulfate) .... Take 1 tablet by mouth once a day 2)  Niaspan 1000 Mg Cr-tabs (Niacin (antihyperlipidemic)) .... Take 2 tablet by mouth once a day 3)  Accupril 40 Mg Tabs (Quinapril hcl) .... Take 1 tablet by mouth once a day for blood pressure 4)  Flax Seed Oil 1000 Mg Caps (Flaxseed (linseed)) .... Take 1 capsule by mouth two times a day 5)  Aspir-low 81 Mg Tbec (Aspirin) .... Take 1 tablet by mouth two times a day 6)  Coreg 6.25 Mg Tabs (Carvedilol) .... Take 1 tablet by mouth two times a day 7)  Albuterol 90 Mcg/act Aers (Albuterol) .... Take 2 puffs as needed for respiratory difficulty up to every 3 hourly. 8)  Hydrochlorothiazide 25 Mg Tabs (Hydrochlorothiazide) .... Take 1 tablet by mouth once a day 9)  Sudafed Pe Sinus/allergy 4-10 Mg Tabs (Chlorpheniramine-phenylephrine) .... Take 1 tablet by mouth two times a day 10)  Nasonex 50 Mcg/act Susp (Mometasone furoate) .... Apply on spray on each nostril daily. 11)  Novolin R 100 Unit/ml Soln (Insulin regular human) .... Inject 30 units subcutaneously with each meal 12)  Insulin Syringe 30g X 5/16" 1 Ml Misc (Insulin syringe-needle  u-100) .... Use to inject insulin 13)  Truetrack Test Strp (Glucose blood) .... Use to test blood glucsoe 3x daily 14)  Lancets Thin Misc (Lancets) .... Use to test blood glucose 3x daily 15)  Singulair 10 Mg Tabs (Montelukast sodium) .... Take 1 tablet by mouth once a day 16)  Lantus 100 Unit/ml Soln (Insulin glargine) .... Take 50 units daily. 17)  Allegra 180 Mg Tabs (Fexofenadine hcl) .... Take 1 pill by mouth daily as needed for allergy 18)  Crestor 5 Mg Tabs (Rosuvastatin calcium) .... Take 1 pill by mouth daily. 19)  Prandin 0.5 Mg Tabs (Repaglinide) .... Take one tablet 15 minutes before meal if you can not take insulin. 20)  Vicodin 5-500 Mg Tabs (Hydrocodone-acetaminophen) .... Take 1 pill by mouth three times a day as needed for pain. 21)  Cialis 20 Mg Tabs (Tadalafil) .... Take one pill daily as needed 30 minutes before sexual activity. 22)  Neurontin 300 Mg Caps (Gabapentin) .... Take 1 capsule at bedtime daily.  Patient Instructions: 1)  Please schedule a follow-up appointment in 2 weeks. 2)  Limit your Sodium (Salt) to less than 2 grams a day(slightly less than 1/2 a teaspoon) to prevent fluid retention, swelling, or worsening of symptoms. 3)  It is important that you exercise regularly at least 20 minutes 5 times a week. If you develop chest pain, have severe difficulty breathing, or feel very tired , stop exercising immediately and seek medical attention. 4)  You need to lose weight. Consider a lower calorie diet and regular exercise.  5)  Check your blood sugars regularly. If your readings are usually above : or below 70 you should contact our office. 6)  It is important that your Diabetic A1c level is checked every 3 months. 7)  See your eye doctor yearly to check for diabetic eye damage. 8)  Check your feet each night for sore areas, calluses or signs of infection. 9)  Check your Blood Pressure regularly. If it is above: you should make an  appointment. Prescriptions: NEURONTIN 300 MG CAPS (GABAPENTIN) take 1 capsule at bedtime daily.  #30 x 2   Entered and Authorized by:   Jason Coop MD   Signed by:   Jason Coop MD on 09/28/2009   Method used:   Print then Give to Patient   RxID:   [id number] CIALIS 20 MG TABS (TADALAFIL) take one pill daily as needed 30 minutes before sexual activity.  #20 x 3   Entered and Authorized by:   Jason Coop MD   Signed by:   Jason Coop MD on 09/28/2009   Method used:   Print then Give to Patient   RxID:   [id number] VICODIN 5-500 MG TABS (HYDROCODONE-ACETAMINOPHEN) take 1 pill by mouth three times a day as needed for pain.  #50 x 0   Entered and Authorized by:   Jason Coop MD   Signed by:   Jason Coop MD on 09/28/2009   Method used:   Print then Give to Patient   RxID:   [id number]  Process Orders Check Orders Results:     [facility]: ABN not required for this insurance Tests Sent for requisitioning (September 29, 2009 12:43 PM):  09/28/2009: [facility] -- T-Comprehensive Metabolic Panel [phone] (signed)    Prevention & Chronic Care Immunizations   Influenza vaccine: Fluvax 3+  (05/19/2009)   Influenza vaccine deferral: Deferred  (04/03/2009)    Tetanus booster: Not documented   Td booster deferral: Deferred  (04/03/2009)    Pneumococcal vaccine: Not documented   Pneumococcal vaccine deferral: Deferred  (04/03/2009)  Colorectal Screening   Hemoccult: Not documented   Hemoccult action/deferral: Deferred  (04/03/2009)    Colonoscopy: Not documented   Colonoscopy action/deferral: Deferred  (04/03/2009)  Other Screening   PSA: Not documented   PSA action/deferral: Discussion deferred  (04/03/2009)   Smoking status: quit  (09/28/2009)  Diabetes Mellitus   HgbA1C: 8.2  (09/28/2009)   HgbA1C action/deferral: Deferred  (04/03/2009)   Hemoglobin A1C due: 09/16/2007     Eye exam: No diabetic retinopathy.   Visual acuity OD (best corrected):     20/25 Visual acuity OS (best corrected):     20/30 Intraocular pressure OD:     22 Intraocular pressure OS:     22   (11/02/2008)   Eye exam due: 11/2009    Foot exam: yes  (09/28/2008)   High risk foot: No  (09/28/2008)   Foot care education: Done  (09/28/2008)   Foot exam due: 12/18/2007    Urine microalbumin/creatinine ratio: 5.0  (05/19/2009)   Urine microalbumin/cr due: 12/21/2007    Diabetes flowsheet reviewed?: Yes   Progress toward A1C goal: Deteriorated  Lipids   Total Cholesterol: 88  (05/19/2009)   Lipid panel action/deferral: Deferred   LDL: 43  (05/19/2009)   LDL Direct: Not documented   HDL: 32  (05/19/2009)   Triglycerides: 66  (05/19/2009)   Lipid panel due: 12/19/2007    SGOT (AST): 57  (05/19/2009)   SGPT (ALT): 72  (05/19/2009) CMP ordered    Alkaline phosphatase: 110  (05/19/2009)   Total bilirubin: 0.6  (05/19/2009)    Lipid flowsheet reviewed?: Yes   Progress toward LDL goal: Unchanged  Hypertension   Last Blood Pressure: 114 / 71  (09/28/2009)   Serum creatinine: 0.99  (05/19/2009)   BMP action: Deferred   Serum potassium 4.0  (05/19/2009) CMP ordered     Hypertension flowsheet reviewed?: Yes   Progress toward BP goal: Unchanged  Self-Management Support :   Personal Goals (by the next clinic visit) :     Personal A1C goal: 7  (05/19/2009)     Personal blood pressure goal: 140/90  (05/19/2009)     Personal LDL goal: 70  (05/19/2009)    Patient will work on the following items until the next clinic visit to reach self-care goals:     Medications and monitoring: take my medicines every day, check my blood sugar, bring all of my medications to every visit, examine my feet every day  (09/28/2009)     Eating: drink diet soda or water instead of juice or soda, eat foods that are low in salt, limit or avoid alcohol  (09/28/2009)     Activity: take a 30 minute walk every  day, take the stairs instead of the elevator, park at the far end of the parking lot  (09/28/2009)    Diabetes self-management support: Written self-care plan  (09/28/2009)   Diabetes care plan printed   Last diabetes self-management training by diabetes educator: 03/03/2009    Hypertension self-management support: Written self-care plan  (09/28/2009)   Hypertension self-care plan printed.    Lipid self-management support: Written self-care plan  (09/28/2009)  Lipid self-care plan printed.   Laboratory Results   Blood Tests   Date/Time Received: September 28, 2009 4:03 PM Date/Time Reported: Alric Quan  September 28, 2009 4:03 PM  HGBA1C: 8.2%   (Normal Range: Non-Diabetic - 3-6%   Control Diabetic - 6-8%) CBG Random:: 276mg /dL     Process Orders Check Orders Results:     [facility]: ABN not required for this insurance Tests Sent for requisitioning (September 29, 2009 12:43 PM):     09/28/2009: [facility] -- T-Comprehensive Metabolic Panel [phone] (signed)

## 2010-09-18 NOTE — Progress Notes (Signed)
Summary: refill/gg  Phone Note Refill Request  on February 20, 2010 2:35 PM  Refills Requested: Medication #1:  PRANDIN 0.5 MG TABS Take one tablet 15 minutes before meal if you can not take insulin.   Last Refilled: 01/11/2010  Medication #2:  COREG 6.25 MG TABS Take 1 tablet by mouth two times a day   Last Refilled: 11/30/2009  Method Requested: Fax to Local Pharmacy Initial call taken by: Merrie Roof RN,  February 20, 2010 4:02 PM  Follow-up for Phone Call        Refill approved-nurse to complete. Follow-up by: Margarito Liner MD,  February 20, 2010 5:24 PM  Additional Follow-up for Phone Call Additional follow up Details #1::        Rx called to pharmacy Additional Follow-up by: Merrie Roof RN,  February 23, 2010 8:59 AM    Prescriptions: PRANDIN 0.5 MG TABS (REPAGLINIDE) Take one tablet 15 minutes before meal if you can not take insulin.  #32 x 1   Entered and Authorized by:   Margarito Liner MD   Signed by:   Margarito Liner MD on 02/20/2010   Method used:   Telephoned to ...       [facility] (retail)       [address] [place], [place]  [address]       Ph: [phone]       Fax: [phone]   RxID:   [id number] COREG 6.25 MG TABS (CARVEDILOL) Take 1 tablet by mouth two times a day  #60 x 1   Entered and Authorized by:   Margarito Liner MD   Signed by:   Margarito Liner MD on 02/20/2010   Method used:   Telephoned to ...       [facility] Department (retail)       [address] [place], [place]  [address]       Ph: [phone]       Fax: [phone]   RxID:   [id number]

## 2010-09-18 NOTE — Progress Notes (Signed)
Summary: refill/ hla  Phone Note Refill Request Message from:  Fax from Pharmacy on March 07, 2008 12:26 PM  Refills Requested: Medication #1:  VIAGRA 100 MG TABS as needed   Last Refilled: 3/4 Initial call taken by: Marin Roberts RN,  March 07, 2008 12:26 PM  Follow-up for Phone Call       Follow-up by: Jason Coop MD,  March 07, 2008 1:36 PM      Prescriptions: VIAGRA 100 MG TABS (SILDENAFIL CITRATE) as needed  #15 x 1   Entered and Authorized by:   Jason Coop MD   Signed by:   Jason Coop MD on 03/07/2008   Method used:   Telephoned to ...       [facility]       [address] [place], [place]  [address]       Ph: [phone]       Fax: [phone]   RxID:   [id number]

## 2010-09-19 ENCOUNTER — Telehealth: Payer: Self-pay | Admitting: *Deleted

## 2010-09-19 ENCOUNTER — Other Ambulatory Visit: Payer: Self-pay | Admitting: Internal Medicine

## 2010-09-19 MED ORDER — HYDROCHLOROTHIAZIDE 25 MG PO TABS: 25.0000 mg | ORAL_TABLET | Freq: Every day | ORAL | Status: DC

## 2010-09-19 NOTE — Telephone Encounter (Signed)
Pt request # 90

## 2010-09-20 NOTE — Progress Notes (Signed)
Summary: phone/gg  Phone Note Call from Patient   Caller: Patient Summary of Call: Pt called with c/o pain in rt lung area, He is SOB and has productive  cough. He is unable to lift arm without pain.  His employer is making him leave work Denies fever.  Onset 3 days ago Also has rt ear pain.  We have no appointments today and advise ED for evaluation Initial call taken by: Merrie Roof RN,  September 10, 2010 1:38 PM  Follow-up for Phone Call        agree with plan Follow-up by: Julaine Fusi  DO,  September 10, 2010 5:11 PM

## 2010-09-20 NOTE — Consult Note (Addendum)
Summary: [facility]   Imported By: Margie Billet 08/29/2010 09:59:42  _____________________________________________________________________  External Attachment:    Type:   Image     Comment:   External Document  Appended Document: [facility]    Diabetic Eye Exam  Procedure date:  08/24/2010  Findings:      No diabetic retinopathy.     Procedures Next Due Date:    Diabetic Eye Exam: 02/2011   Diabetic Eye Exam  Procedure date:  08/24/2010  Findings:      No diabetic retinopathy.     Procedures Next Due Date:    Diabetic Eye Exam: 02/2011

## 2010-10-29 LAB — POCT CARDIAC MARKERS
CKMB, poc: 1.1 ng/mL (ref 1.0–8.0)
Myoglobin, poc: 55.9 ng/mL (ref 12–200)
Troponin i, poc: <0.05 ng/mL (ref 0.00–0.09)

## 2010-10-29 LAB — DIFFERENTIAL
Basophils Absolute: 0 10*3/uL (ref 0.0–0.1)
Eosinophils Relative: 5 % (ref 0–5)
Lymphocytes Relative: 24 % (ref 12–46)
Lymphs Abs: 1.9 10*3/uL (ref 0.7–4.0)
Neutro Abs: 5.2 10*3/uL (ref 1.7–7.7)

## 2010-10-29 LAB — CBC
HCT: 39 % (ref 39.0–52.0)
MCV: 85 fL (ref 78.0–100.0)
Platelets: 152 10*3/uL (ref 150–400)
RBC: 4.59 MIL/uL (ref 4.22–5.81)
RDW: 12.1 % (ref 11.5–15.5)
WBC: 8 10*3/uL (ref 4.0–10.5)

## 2010-10-29 LAB — BASIC METABOLIC PANEL
Chloride: 108 mEq/L (ref 96–112)
Creatinine, Ser: 0.82 mg/dL (ref 0.4–1.5)
GFR calc Af Amer: >60 mL/min (ref >60)
GFR calc non Af Amer: >60 mL/min (ref >60)
Potassium: 3.6 mEq/L (ref 3.5–5.1)

## 2010-10-29 LAB — GLUCOSE, CAPILLARY: Glucose-Capillary: 183 mg/dL — ABNORMAL HIGH (ref 70–99)

## 2010-10-29 LAB — PROTIME-INR: Prothrombin Time: 13.4 seconds (ref 11.6–15.2)

## 2010-11-02 ENCOUNTER — Encounter: Payer: Self-pay | Admitting: Internal Medicine

## 2010-11-15 ENCOUNTER — Other Ambulatory Visit: Payer: Self-pay | Admitting: *Deleted

## 2010-11-16 MED ORDER — INSULIN REGULAR HUMAN 100 UNIT/ML IJ SOLN: INTRAMUSCULAR | Status: DC

## 2010-11-16 MED ORDER — INSULIN GLARGINE 100 UNIT/ML ~~LOC~~ SOLN: SUBCUTANEOUS | Status: DC

## 2010-11-16 NOTE — Telephone Encounter (Signed)
I will approve the requests but we need to make sure the patient has a follow up appointment in the near future.  He is due for labs in May so sometime around the middle of May would be perfect.  Thanks!

## 2010-11-24 LAB — GLUCOSE, CAPILLARY: Glucose-Capillary: 76 mg/dL (ref 70–99)

## 2010-11-25 LAB — GLUCOSE, CAPILLARY: Glucose-Capillary: 258 mg/dL — ABNORMAL HIGH (ref 70–99)

## 2010-11-27 LAB — GLUCOSE, CAPILLARY: Glucose-Capillary: 258 mg/dL — ABNORMAL HIGH (ref 70–99)

## 2010-12-03 LAB — GLUCOSE, CAPILLARY: Glucose-Capillary: 198 mg/dL — ABNORMAL HIGH (ref 70–99)

## 2010-12-10 ENCOUNTER — Encounter: Payer: Self-pay | Admitting: Internal Medicine

## 2010-12-10 ENCOUNTER — Ambulatory Visit (INDEPENDENT_AMBULATORY_CARE_PROVIDER_SITE_OTHER): Payer: Self-pay | Admitting: Internal Medicine

## 2010-12-10 VITALS — BP 122/74 | HR 79 | Temp 97.4°F | Ht 73.0 in | Wt 217.9 lb

## 2010-12-10 DIAGNOSIS — E785 Hyperlipidemia, unspecified: Secondary | ICD-10-CM

## 2010-12-10 DIAGNOSIS — N529 Male erectile dysfunction, unspecified: Secondary | ICD-10-CM

## 2010-12-10 DIAGNOSIS — J302 Other seasonal allergic rhinitis: Secondary | ICD-10-CM

## 2010-12-10 DIAGNOSIS — J45909 Unspecified asthma, uncomplicated: Secondary | ICD-10-CM

## 2010-12-10 DIAGNOSIS — M2550 Pain in unspecified joint: Secondary | ICD-10-CM

## 2010-12-10 DIAGNOSIS — E119 Type 2 diabetes mellitus without complications: Secondary | ICD-10-CM

## 2010-12-10 DIAGNOSIS — I10 Essential (primary) hypertension: Secondary | ICD-10-CM

## 2010-12-10 DIAGNOSIS — E782 Mixed hyperlipidemia: Secondary | ICD-10-CM

## 2010-12-10 DIAGNOSIS — J309 Allergic rhinitis, unspecified: Secondary | ICD-10-CM

## 2010-12-10 MED ORDER — ROSUVASTATIN CALCIUM 5 MG PO TABS: 5.0000 mg | ORAL_TABLET | Freq: Every day | ORAL | Status: DC

## 2010-12-10 MED ORDER — ALBUTEROL 90 MCG/ACT IN AERS: 2.0000 | INHALATION_SPRAY | Freq: Four times a day (QID) | RESPIRATORY_TRACT | Status: DC | PRN

## 2010-12-10 MED ORDER — INSULIN GLARGINE 100 UNIT/ML ~~LOC~~ SOLN: SUBCUTANEOUS | Status: DC

## 2010-12-10 MED ORDER — NIACIN ER (ANTIHYPERLIPIDEMIC) 1000 MG PO TBCR: 1000.0000 mg | EXTENDED_RELEASE_TABLET | Freq: Every day | ORAL | Status: DC

## 2010-12-10 MED ORDER — QUINAPRIL HCL 40 MG PO TABS: 40.0000 mg | ORAL_TABLET | Freq: Every day | ORAL | Status: DC

## 2010-12-10 MED ORDER — MONTELUKAST SODIUM 10 MG PO TABS: 10.0000 mg | ORAL_TABLET | Freq: Every day | ORAL | Status: DC

## 2010-12-10 MED ORDER — CARVEDILOL 6.25 MG PO TABS: 6.2500 mg | ORAL_TABLET | Freq: Two times a day (BID) | ORAL | Status: DC

## 2010-12-10 MED ORDER — HYDROCHLOROTHIAZIDE 25 MG PO TABS: 25.0000 mg | ORAL_TABLET | Freq: Every day | ORAL | Status: DC

## 2010-12-10 MED ORDER — HYDROCODONE-ACETAMINOPHEN 5-500 MG PO TABS: 2.0000 | ORAL_TABLET | Freq: Four times a day (QID) | ORAL | Status: DC | PRN

## 2010-12-10 MED ORDER — REPAGLINIDE 0.5 MG PO TABS: 0.5000 mg | ORAL_TABLET | Freq: Three times a day (TID) | ORAL | Status: DC

## 2010-12-10 MED ORDER — LEVOCETIRIZINE DIHYDROCHLORIDE 5 MG PO TABS: 5.0000 mg | ORAL_TABLET | Freq: Every evening | ORAL | Status: DC

## 2010-12-10 MED ORDER — MOMETASONE FUROATE 50 MCG/ACT NA SUSP: 2.0000 | Freq: Every day | NASAL | Status: DC

## 2010-12-10 MED ORDER — ASPIRIN EC 81 MG PO TBEC: 81.0000 mg | DELAYED_RELEASE_TABLET | Freq: Every day | ORAL | Status: DC

## 2010-12-10 MED ORDER — TADALAFIL 20 MG PO TABS: 20.0000 mg | ORAL_TABLET | Freq: Every day | ORAL | Status: DC | PRN

## 2010-12-10 NOTE — Assessment & Plan Note (Signed)
Past LDL was 50, will check fasting lipid today and continue Crestor 5

## 2010-12-10 NOTE — Patient Instructions (Signed)
Please increase Lantus to 60 units at bedtime.

## 2010-12-10 NOTE — Assessment & Plan Note (Signed)
Patient A1c is 8.9 today, patient does not have his meter today, we'll increase his Lantus to 60 units and have her come back to see Jamison Neighbor for diabetic education

## 2010-12-10 NOTE — Progress Notes (Signed)
  Subjective:    Patient ID: TRAN ARZUAGA, male    DOB: [birth year], 55 y.o.   MRN: [id number]  HPI  She is a 55 year old male with a past medical history in EMR, presents to outpatient clinic for routine followup, patient does not have his meter, states his sugars are in the 100-200 range, patient requires medication refills on all of his medications. Denies any new complaints. Reports compliance with all of his medications.   Review of Systems  [all other systems reviewed and are negative       Objective:   Physical Exam  [nursing notereviewed. Constitutional: He is oriented to person, place, and time. He appears well-developed and well-nourished.  HENT:  Head: Normocephalic and atraumatic.  Eyes: Pupils are equal, round, and reactive to light.  Neck: Normal range of motion. No JVD present. No thyromegaly present.  Cardiovascular: Normal rate, regular rhythm and normal heart sounds.   Pulmonary/Chest: Effort normal and breath sounds normal. He has no wheezes. He has no rales.  Abdominal: Soft. Bowel sounds are normal. There is no tenderness. There is no rebound.  Musculoskeletal: Normal range of motion. He exhibits no edema.  Neurological: He is alert and oriented to person, place, and time.  Skin: Skin is warm and dry.          Assessment & Plan:

## 2010-12-10 NOTE — Assessment & Plan Note (Signed)
Well-controlled, no changes made in his regimen

## 2010-12-11 LAB — CBC
HCT: 43.3 % (ref 39.0–52.0)
MCH: 28.6 pg (ref 26.0–34.0)
MCV: 84.2 fL (ref 78.0–100.0)
Platelets: 197 10*3/uL (ref 150–400)
RBC: 5.14 MIL/uL (ref 4.22–5.81)
WBC: 8.5 10*3/uL (ref 4.0–10.5)

## 2010-12-11 LAB — COMPLETE METABOLIC PANEL WITH GFR
Albumin: 4.6 g/dL (ref 3.5–5.2)
Alkaline Phosphatase: 114 U/L (ref 39–117)
BUN: 17 mg/dL (ref 6–23)
CO2: 24 mEq/L (ref 19–32)
GFR, Est African American: >60 mL/min (ref >60)
GFR, Est Non African American: >60 mL/min (ref >60)
Glucose, Bld: 84 mg/dL (ref 70–99)
Total Bilirubin: 0.7 mg/dL (ref 0.3–1.2)
Total Protein: 6.8 g/dL (ref 6.0–8.3)

## 2010-12-11 LAB — LIPID PANEL
Cholesterol: 105 mg/dL (ref 0–200)
HDL: 36 mg/dL — ABNORMAL LOW (ref >39)
Total CHOL/HDL Ratio: 2.9 Ratio

## 2011-01-01 NOTE — Assessment & Plan Note (Signed)
[facility]                            CARDIOLOGY OFFICE NOTE   NAME:Tyler Wilkinson                       MRN:          [id number]  DATE:09/22/2008                            DOB:          05-Jul-1956    [name] has coronary artery disease.  He actually underwent  catheterization in 2006.  He had PTCA to the posterior lateral branch of  the right coronary artery and with the deployment of a MiniVision stent.  He has done well since then.  He was admitted to [facility] in June  2008.  At that time, he was stable and he had a negative Myoview scan.  He is now doing well.  He is active.  He was seen by Dr. Aleene Davidson with a  very complete evaluation.  He is referred to me for reassessment of his  cardiac status.  The patient has no chest pain.  He exercises regularly  and wants to be able to ride his bike more aggressively.   PAST MEDICAL HISTORY:   ALLERGIES:  No known drug allergies.   MEDICATIONS:  1. Plavix 75.  2. Niaspan.  3. Accupril.  4. Flaxseed oil.  5. Famotidine.  6. Aspirin.  7. Coreg.  8. Hydrochlorothiazide.  9. Novolin.  10.Singulair.  11.Lantus.  12.Crestor.   OTHER MEDICAL PROBLEMS:  See the list below.   REVIEW OF SYSTEMS:  Today, the patient has no GI or GU complaints.  He  has no skin rashes. There are no headaches, fevers, or chills.  His  review of systems otherwise is negative.   PHYSICAL EXAMINATION:  VITAL SIGNS:  Blood pressure is 114/70.  His  pulse rate is 75.  GENERAL:  The patient is oriented to person, time, and place.  Affect is  normal.  HEENT:  No xanthelasma.  He has normal extraocular motion.  NECK:  There are no carotid bruits.  There is no jugular venous  distention.  LUNGS:  Clear.  Respiratory effort is not labored.  CARDIAC:  S1 with an S2.  There are no clicks or significant murmurs.  ABDOMEN:  Soft.  EXTREMITIES:  He has no peripheral edema.   His EKG today is normal.   PROBLEMS:  #1.   History of short-term memory loss.  #2.  History of asthma.  #3.  Coronary artery disease.  The patient had stent placed in 2006.  He  has done well on aspirin and Plavix.  It would be safe for him to come  off his Plavix at this time and I have discussed this with him.  He  should be continued on aspirin and other aggressive secondary prevention  measures.  He thinks that he may tolerate metoprolol better than  carvedilol.  It would be fine to use metoprolol in his case.  Concerning  his exercise level, I believe that it will be good for him to exercise.  I do not feel that we need to press for any other tests at this time.  #4.  History of erectile dysfunction.  #5.  Hyperlipidemia.  #6.  History of  head trauma.  #7.  Hypertension.  #8.  Diabetes.  #9.  History of ejection fraction in the range of 45%.  I will plan to  see [name] back for Cardiology followup and we will decide over time  if further testing is needed.     [name], MD, [facility]  Electronically Signed    JDK/MedQ  DD: 09/22/2008  DT: 09/23/2008  Job #: [id number]   cc:   Jason Coop, MD

## 2011-01-01 NOTE — Discharge Summary (Signed)
NAMEPATRIK, Tyler Wilkinson NO.:  [id number]   MEDICAL RECORD NO.:  [id number]          PATIENT TYPE:  INP   LOCATION:  3736                         FACILITY:  [facility]   PHYSICIAN:  Eliseo Gum, M.D.   DATE OF BIRTH:  [birth year]   DATE OF ADMISSION:  02/10/2007  DATE OF DISCHARGE:  02/13/2007                               DISCHARGE SUMMARY   DISCHARGE DIAGNOSES:  1. Nonischemic chest pain in a patient with a history of coronary      artery disease status post myocardial infarction in December 2006,      status post right coronary artery stent.  Myoview negative for      ischemic during this visit.  2. Hypertension.  3. Hyperlipidemia.  4. Type 2 diabetes mellitus, A1c 8.6%.  No diabetic retinopathy.  5. Marijuana use.  6. History of diverticulitis.  7. Erectile dysfunction.   DISCHARGE MEDICATIONS:  1. Aspirin 81 mg by mouth daily.  2. Lantus 60 units subcu every night.  3. Glucophage 1000 mg by mouth 3 times a day.  4. Metoprolol ER 50 mg by mouth daily.  5. Niacin 500 mg by mouth twice a day.  6. Pravachol 40 mg by mouth every night.  7. Lisinopril 40 mg by mouth daily.  8. Plavix 75 mg by mouth daily.  9. Flaxseed oil 1 gram by mouth twice a day.   DISPOSITION AND FOLLOWUP:  The patient was discharged in hemodynamically  stable condition after his Myoview was found to be negative for  ischemia.  The patient no longer had chest pain on discharge.  He will  follow up at the [facility] in 6 weeks.  He will be  called to set up an appointment. The patient is also to see Dr. Sharyn Lull  on an outpatient basis but this is not urgent considering that his  Myoview was negative for active ischemia.   PROCEDURES:  1. Chest x-ray on June 25 showed chronic bronchitic changes without      acute findings.  2. A Myoview on June 26 showed normal left ventricular function with      an estimated ejection fraction of 55% and normal wall motion.      There  is suggestion of inferolateral scar extending from the base      to mid ventricular level.  No evidence of ischemia.  3. A 2D echocardiogram on June 26 showed a left ventricular ejection      fraction of 50% to 55%.  Findings suggestive of mild hypokinesis of      the basal mid inferior and posterior wall.   CONSULTATIONS:  Dr. Sharyn Lull in Cardiology.   ADMISSION HISTORY:  Mr. Vinsant is a 55 year old man with a past medical  history as above who was admitted to the [facility] Emergency Department  with complaints of chest pain.  The patient was en route to work when he  had a sudden onset of left and right lower chest pain radiating to his  arm and jaw.  Stated that the pain felt like his previous MI, only  not  as bad.  It is described as a pressure sensation and graded as 2 to 3  out of 10.  The pain was constant and relieved by nitroglycerin drip.  The patient denied dyspnea, diaphoresis, nausea, presyncopal and  syncope.  The pain was not modified by position changes or exertion.   ADMISSION PHYSICAL EXAMINATION:  VITAL SIGNS:  Temperature 97.7, blood  pressure 165/92, heart rate 81, respiratory rate 18, O2 saturation 97%  on room air.  The patient was found in no acute distress, resting  comfortably.  EYES/NECK/LUNGS/HEART/ABDOMEN:  Within normal limits.  The patient had  1+ pitting edema of the mid chest bilaterally.  NEUROLOGIC:  Grossly intact.  RECTAL:  Normal.  Guaiac heme negative.   ADMISSION LABS:  Sodium 140, potassium 3.8, chloride 108, bicarb 25, BUN  13, creatinine 1.1, glucose 105, WBC 9.01, ANC 5.4, hemoglobin 14.4, MCV  85, platelets 215.  Point of care markers normal.   HOSPITAL COURSE:  1. Nonischemic chest pain.  On admission, the patient was started on      aspirin and nitroglycerin drip, therapeutic dose, Lovenox, Niacin      and metoprolol given his history of coronary artery disease.      However, he had no ischemic changes on EKG and not only were his       initial cardiac enzymes negative, the serial cardiac enzymes were      negative as well.  His pain resolved within the first 24 hours of      admission.  Dr. Sharyn Lull his cardiologist was consulted for stress      testing purposes.  The nitroglycerin drip and full dose Lovenox      were discontinued on hospital day #2 after serial cardiac enzymes      and serial EKGs were negative for acute ischemic.  As previously      mentioned, his Myoview was negative for acute ischemia  but did      show some old scarring which came as no surprise since Mr. Meinecke      had had an MI in December 2006.  He was continued on beta blocker      as an ACE inhibitor, Plavix and started on a low dose of Pravachol.  2. Hyperlipidemia.  A fasting lipid profile was obtained on admission.      Total cholesterol 178, triglycerides 101, HDL 38, LDL 120.  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  He was      only on Niacin, flax seed oil as an outpatient.  Given his elevated      LDL he was started on a low dose of Pravachol since it had a lower      risk of myositis.  The patient tolerated the  statin well and was      advised to seek medical attention if he developed muscle pain as an      outpatient.  3. Type 2 diabetes mellitus.  As an outpatient, Mr. Kneale was on      Lantus 60 units daily as well as Glucophage 1000 mg twice a day.      Glucophage was held on admission, given that we knew he was likely      to receive IV contrast while hospitalized.  He was continued on his      home dose of Lantus at 60 in addition to an insulin sliding scale.  The patient's A1c was 8.6% which is too elevated. His A1c goal is      7%.  On discharge, he was kept on his Lantus dose of 60 units at      bedtime and restarted on Glucophage.  This will have to be      monitored and adjusted as an outpatient.  4. Hypertension.  The patient was on Accupril 40, Toprol XL 60 as an      outpatient and his  blood pressure control was more or less adequate      throughout his hospital course.  His blood pressure varied enough      that we did not make any attempts to change his antihypertensive      regimen. Again, this will have to be followed as an outpatient.  5. Marijuana use. On admission, Mr. Rubenstein UDS was positive for      marijuana, but negative for cocaine.   DISCHARGE VITALS:  Temperature 98.4, blood pressure 113/63, heart rate  62, respiratory rate 16, O2 saturation 97% on room air.   DISCHARGE LABS:  Sodium 138, potassium 3.9, chloride 105, bicarb 27, BUN  147, creatinine 0.87, glucose 166, calcium 9.2.      Olene Craven, M.D.  Electronically Signed      Eliseo Gum, M.D.  Electronically Signed    MC/MEDQ  D:  04/21/2007  T:  04/21/2007  Job:  [id number]   cc:   Eduardo Osier. Sharyn Lull, M.D.

## 2011-01-01 NOTE — Procedures (Signed)
NAME:  Tyler Wilkinson, Tyler Wilkinson NO.:  [id number]   MEDICAL RECORD NO.:  [id number]          PATIENT TYPE:  OUT   LOCATION:  SLEEP CENTER                 FACILITY:  [facility]   PHYSICIAN:  Clinton D. Maple Hudson, MD, FCCP, FACPDATE OF BIRTH:  1956/02/03   DATE OF STUDY:  03/15/2009                            NOCTURNAL POLYSOMNOGRAM   REFERRING PHYSICIAN:  Jason Coop, MD   INDICATION FOR STUDY:  Hypersomnia with sleep apnea.   EPWORTH SLEEPINESS SCORE:  Epworth sleepiness score 15/24, BMI 29.8.  Weight 220 pounds, height 72 inches.  Neck 18 inches.   MEDICATIONS:  Home medication charted and reviewed.   SLEEP ARCHITECTURE:  Split study protocol.  During the diagnostic phase,  total sleep time 120.5 minutes with sleep efficiency 80.6%.  Stage I  8.3%, stage II 64.7%, stage III 6.2%, REM 20.7% of total sleep time.  Sleep latency 15 minutes.  REM latency 68 minutes.  Awake after sleep  onset 1 minute.  Arousal index 56.3 indicating increased EEG arousal.  No bedtime medication taken.   RESPIRATORY DATA:  Split study protocol.  Apnea/hypopnea index (AHI)  82.2 per hour.  A total of 165 events were scored including 34  obstructive apneas, 1 mixed apnea, 130 hypopneas.  Events were  associated with supine sleep.  REM AHP 103.2.  CPAP was then titrated to  14 CWP, AHI 6.1 per hour.  He wore a large ResMed Quattro full face mask  with heated humidifier.   OXYGEN DATA:  Loud snoring before CPAP with oxygen desaturation to a  nadir of 77%.  After CPAP control, mean oxygen saturation held at 95.1%  on room air.   CARDIAC DATA:  Sinus rhythm with occasional PVC.   MOVEMENT-PARASOMNIA:  No movement disturbance.  Bathroom x2.   IMPRESSIONS-RECOMMENDATIONS:  1. This was a daytime sleep study begun with lights out at 8:25 a.m.      to accommodate the patient's usual sleep schedule.  2. Severe obstructive sleep apnea/hypopnea syndrome, AHI of 82.2 per      hour.  Events were  associated with supine sleep, loud snoring and      oxygen desaturation to a nadir of 77%.  3. Successful CPAP titration to 14 CWP, AHI 6.1 per hour.  He wore a      large ResMed Quattro full face mask with heated humidifier.      Clinton D. Maple Hudson, MD, [place], FACP  Diplomate, Biomedical engineer of Sleep Medicine  Electronically Signed     CDY/MEDQ  D:  03/19/2009 10:20:57  T:  03/19/2009 11:41:24  Job:  [id number]

## 2011-01-02 ENCOUNTER — Ambulatory Visit: Payer: Self-pay | Admitting: Dietician

## 2011-01-03 ENCOUNTER — Telehealth: Payer: Self-pay | Admitting: *Deleted

## 2011-01-03 NOTE — Telephone Encounter (Signed)
Pt called to let us know that last night he got up at 0400 and was sweaty and weak.  He drank some juice and had juice. He went to work and was still not feeling well.  He was jittery and weak.  He ate breakfast but did not feel better. He does not have a CBG machine to check sugar.  Now he feels better, not nervous anymore.  He does not want to be seen at this point. He will call back if he has any problems.   He did state he still feels wobbly.  I transferred him to Lupita Leash, diabetic educator

## 2011-01-03 NOTE — Telephone Encounter (Signed)
Thank you :)

## 2011-01-04 ENCOUNTER — Telehealth: Payer: Self-pay | Admitting: Dietician

## 2011-01-04 NOTE — Cardiovascular Report (Signed)
NAME:  Tyler Wilkinson, Tyler Wilkinson                ACCOUNT NO.:  [id number]   MEDICAL RECORD NO.:  [id number]          PATIENT TYPE:  INP   LOCATION:  1828                         FACILITY:  [facility]   PHYSICIAN:  Mohan N. Sharyn Lull, M.D. DATE OF BIRTH:  11-Jan-1956   DATE OF PROCEDURE:  08/16/2005  DATE OF DISCHARGE:                              CARDIAC CATHETERIZATION   PROCEDURES:  1.  Left cardiac catheterization with selective left and right coronary      angiography, LV-graphy via right groin using Judkins technique.  2.  Successful PTCA to proximal and mid posterolateral ventricular branch of      RCA using 2.2 x 8 mm long Voyager balloon.  3.  Successful deployment of 2 x 12 mm long Mini-Vision stent in      posterolateral branch of RCA.   INDICATIONS FOR THE PROCEDURE:  Mr. Cuffee is a 55 year old white male with  past medical history significant for insulin-requiring diabetes mellitus,  hypertension.  He came to the ER via EMS complaining of retrosternal chest  pressure radiating to the left arm which woke him up at 6:00 a.m. associated  with nausea and diaphoresis.  States he has been having chest pain off and  on and arm pain for the last 4-5 days with minimal exertion, and relieves  with rest, but did not seek any medical attention.  First EKG done in the ER  showed normal sinus rhythm with minimal ST elevation in inferior leads.  Repeat EKG showed more than 1 mm ST elevation in inferior leads, with  minimal rest protocol changes.  The patient received sublingual  nitroglycerin, IV heparin, Plavix, aspirin, and IV Lopressor, with partial  relief of chest pain.  Due to typical anginal chest pain, EKG changes,  discussed with patient regarding left catheterization, possible PTCA  stenting, __________, death, MI, stroke, need for emergency CABG, local  vascular complications, risk of restenosis, etc., and consented for the  procedure.   PROCEDURE:  After obtaining the informed consent, the  patient was brought to  the catheterization lab and was placed on the fluoroscopy table.  Right  groin was prepped and draped in the usual fashion.  2% Xylocaine was used  for local anesthesia in the right groin.  With the help of thin-wall  needles, 7 French arterial line and 6 French venous sheaths were placed.  Both the sheaths were aspirated and flushed.  Next, a 6 French left Judkins  catheter was advanced over the wire under fluoroscopic guidance up to the  ascending aorta.  Wire was pulled out.  The catheter was aspirated and  connected to the manifold.  Catheter was further advanced and engaged into  the left coronary ostium.  Multiple views of the left system were taken.  Next, the catheter was disengaged and was pulled out over the wire and was  replaced with a 6 French right Judkins catheter which was advanced over the  wire under fluoroscopic guidance up to the ascending aorta.  Wire was pulled  out.  The catheter was aspirated and connected to the manifold.  Catheter  was  further advanced and engaged into the right coronary ostium.  Multiple  views of the right system were taken.  Next, the catheter was disengaged and  was pulled out over the wire and was replaced with a 6 French pigtail  catheter at the end of the procedure.  The catheter was advanced over the  wire, across the aortic valve, into the LV.  LV pressures were recorded.  Next, LV-graph was done in 30-degree RAO position.  Postangiographic  pressures were recorded from LV, and then pullback pressures were recorded  from the aorta.  There was no gradient across the aortic valve.  Next, the  pigtail catheter was pulled out over the wire.  Sheaths were aspirated and  flushed.   FINDINGS:  LV showed anteroapical wall hypokinesia, EF of 45% approximately.  Left main was short, which was patent.  LAD has 50-60% proximal, mid, and  distal multiple sequential stenosis, with haziness, with TIMI III distal  flow.  Diagonal  1 has 20-30% ostial and mid stenosis.  Left circumflex is  patent proximally and then tapers down in AV groove after giving off OM2.  OM1 is large, which has 30-40% proximal stenosis.  OM2 is very small, which  has 30-40% proximal stenosis with haziness.  RCA was large, which has 20-25%  proximal focal stenosis.  PDA was patent.  Posterolateral ventricular branch  was 100% occluded, which was a small vessel, with TIMI 0 flow, which was the  culprit lesion for acute anterior wall MI.  PDA was small, which was patent.   INTERVENTIONAL PROCEDURE:  Successful PTCA to proximal and mid-  posterolateral branch was done using 2 x 8 mm long Voyager balloon.  Multiple inflations were done.  Angiogram showed persistent elastic recoil,  and then 2 x 12 mm Mini-Vision stent was deployed at 14 atmospheres  pressure.  Lesion was dilated from 100% to 0% residual with excellent TIMI  grade III distal flow, without evidence of dissection of distal  embolization.  The patient received weight-based heparin, Integrilin, and  total of 600 mg of Plavix during the procedure.  The patient tolerated the  procedure well.  There were no complications.  The patient was transferred  to the recovery room in stable condition.           ______________________________  Eduardo Osier Sharyn Lull, M.D.     MNH/MEDQ  D:  08/16/2005  T:  08/17/2005  Job:  [id number]   cc:   Catheterization Lab   Eduardo Osier. Sharyn Lull, M.D.  Fax: [phone]

## 2011-01-04 NOTE — Discharge Summary (Signed)
NAME:  Tyler Wilkinson, Tyler Wilkinson                ACCOUNT NO.:  [id number]   MEDICAL RECORD NO.:  [id number]          PATIENT TYPE:  INP   LOCATION:  3735                         FACILITY:  [facility]   PHYSICIAN:  Mohan N. Sharyn Lull, M.D. DATE OF BIRTH:  [birth year]   DATE OF ADMISSION:  08/16/2005  DATE OF DISCHARGE:  08/20/2005                                 DISCHARGE SUMMARY   ADMITTING DIAGNOSES:  Acute inferior wall MI.  Uncontrolled hypertension.  Insulin dependent diabetes mellitus.   FINAL DIAGNOSIS:  1.  Status post inferior wall MI.  2.  Status post PTCA stenting to posterolateral branch of RCA.  3.  Coronary artery disease.  4.  Hypertension.  5.  Diverticulitis.  6.  Anxiety disorder.   DISCHARGE MEDICATIONS:  1.  Enteric coated aspirin 325 mg 1 tablet daily.  2.  Plavix 75 mg 1 tablet daily with food.  3.  Lopressor 25 mg 1 tablet twice daily.  4.  Lisinopril 10 mg 1 tablet daily.  5.  Lipitor 40 mg 1 tablet daily.  6.  Valium 5 mg 1/2 tablet twice daily.  7.  Nitro-Dur 0.4 mg per hour, apply to chest wall in the a.m., off at      night.  8.  Nitrostat 0.4 mg sublingual.  9.  Zyrtec.  10. Glucophage 1000 mg daily as before.  11. Humulin N and Humulin R insulin as before.   DIET:  Low salt, low cholesterol, 1800 calories ADA diet.   The patient has been advised to monitor blood sugar twice daily.   ACTIVITY:  1.  Avoid heavy lifting, pushing, pulling.  2.  Post PTCA and stent instructions have been given.  3.  Follow up with me in one week.   CONDITION ON DISCHARGE:  Stable.  The patient will be scheduled for stress  Myoview as outpatient in 2 weeks to evaluate the significance of multiple  borderline LAD lesions.   BRIEF HISTORY AND HOSPITAL COURSE:  Tyler Wilkinson is a 55 year old white male  with past medical history significant for insulin dependent diabetes  mellitus, hypertension who came to the ER via EMS complaining of  retrosternal chest pressure radiating to the left  arm which woke him up  around 6 a.m. associated with nausea and diaphoresis.  He states he has been  having chest pain and arm pain off and on since last 4-5 days with minimal  exertion, relieved with rest but did not seek any medical attention.  EKG  done in the ER showed normal sinus rhythm with minimal ST elevation in  inferior leads.  His PDK showed more than 1 mm ST elevation in inferior  leads with minimal focal changes.  The patient received IV nitro, IV  heparin, Plavix, aspirin and IV Lopressor with resolution of chest pain.   PAST MEDICAL HISTORY:  As above.   PAST SURGICAL HISTORY:  None.   ALLERGIES:  None.   SOCIAL HISTORY:  Denies smoking, but smokes occasional, but no history of  smoking abuse.  Drinks beer occasionally.  Works at [employer].   FAMILY  HISTORY:  Noncontributory.   PHYSICAL EXAMINATION:  GENERAL:  He is alert, awake and oriented x 3 in on  acute distress.  VITAL SIGNS:  Blood pressure 170/90, pulse is 84.  HEENT:  Conjunctivae is pink.  NECK:  Supple.  No JVD, no bruits.  LUNGS:  Clear to auscultation bilaterally.  HEART:  S1, S2, normal. There was S4 gallop.  ABDOMEN: Soft.  Bowel sounds are present, nontender.  EXTREMITIES: No cyanosis, clubbing or edema.   LABORATORY DATA:  Cholesterol was 111, HDL was low at 33, LDL was 60, CK was  372, MB 40.9, electrolytes 9.0.  Second set CK 261, MB 21.4, electrolytes  8.2.  Third set CK was 197, MB 10.9, electrolytes  5.5.  CK today is 87, MB  of 1.5, troponin-I was 6.65.  Second set 5.06, 2.72.  Today's iron 72. His  Hemoglobin A1C was 7.2, liver enzymes were normal.  Serum 140, potassium  3.8, chloride 109, bicarb 24, glucose 178, BUN 12, creatinine 0.9.  Hemoglobin was 13, hematocrit 37.3, white count 10.7.  Hemoglobin on 01/01  showed .8, hematocrit 35.9.   HOSPITAL COURSE:  The patient was taken to the cath lab and underwent  emergent PTCA and stenting to posterolateral ventricular branch  of RCA which  was 100% occluded and was the culprit vessel for MI.  The patient tolerated  the procedure.  There were no complications.  The patient did not have any  further episodes of anginal chest pains during the hospital stay.  __________  Has no evidence of hematoma or bruit. He has been advised to  __________ .  The patient has been ambulating in the hallway without any  problems.  His blood sugar has been elevated.  We will restart his Humulin N  and Humulin R insulin as before.  The patient states that blood sugar is  fairly well controlled as outpatient.  The patient does have 60-70% proximal  LAD stenosis.  Will schedule him for  stress Myoview as outpatient, still needs to evaluate significant proximal  LAD lesions.  Patient will be discharged on the aforementioned medications  and will be followed up in the office in one week.  The patient will be  scheduled for cardiac rehab with stress test done.           ______________________________  Eduardo Osier. Sharyn Lull, M.D.     [facility]/MEDQ  D:  08/20/2005  T:  08/20/2005  Job:  [id number]   cc:   Lajuana Matte, MD  Fax: [phone]

## 2011-01-04 NOTE — Discharge Summary (Signed)
NAME:  Tyler Wilkinson, Tyler Wilkinson NO.:  [id number]   MEDICAL RECORD NO.:  [id number]                   PATIENT TYPE:  INP   LOCATION:  5731                                 FACILITY:  [facility]   PHYSICIAN:  Dineen Kid. Reche Dixon, M.D.               DATE OF BIRTH:  [birth year]   DATE OF ADMISSION:  12/10/2002  DATE OF DISCHARGE:  12/15/2002                                 DISCHARGE SUMMARY   DISCHARGE DIAGNOSES:  1. Abdominal pain likely secondary to gastritis versus diabetic     gastroenteropathy.  2. Diabetes mellitus.  3. Hypertension.  4. Seasonal allergies.  5. Chronic low back pain.  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   DISCHARGE MEDICATIONS:  1. Protonix 40 mg p.o. daily.  2. Reglan 5 mg p.o. q.a.c.  3. Phenergan 12.5 mg p.o. q.4-6 h. p.r.n. nausea.  4. Insulin 20 units NPH b.i.d.  5. Insulin 10 units Regular b.i.d.  6. Zyrtec 10 mg p.o. daily.  7. Nasonex p.r.n.  8. Allegra 100 mg p.o. p.r.n.  9. Colace 100 mg p.o. b.i.d.   DISPOSITION:  The patient is discharged home in good condition.   FOLLOWUP:  The patient is to follow up in the [facility]  on May 5, at 3:30 p.m. to see Dr. Merlinda Frederick.   ISSUES IN FOLLOWUP:  1. During the hospitalization the patient had abdominal pain that was not of     clear etiology.  This should be monitored in the outpatient setting with     consideration given to GI consult if the patient is still experiencing     significant abdominal pain with decreased p.o. intake.  2. CMET to be checked in followup as the patient had an isolated elevation     of his bilirubin during the hospitalization.  3. CKs should be checked in followup as patient did have a mildly elevated     CK without obvious cause.  4. Urinalysis should be checked in the outpatient setting as the patient     initially presented with evidence of hematuria.  Repeat urinalysis during     the hospitalization was within normal limits with the exception  of     glucose and testing positive for hemoglobin without evidence of red blood     cells.  This should be followed up in the outpatient setting.  5. The patient has diagnosis of hypertension.  His blood pressure was mildly     elevated during the hospitalization.  However, the patient was not     discharged home on his hypertensive medication given that some component     of this may be secondary to pain.  Should the patient's blood pressure     continue to be elevated in the outpatient setting, initiation of     antihypertensive regimen should be considered with possibility of an ACE     inhibitor versus HCTZ  as an initial agent.   PROCEDURES:  During the hospitalization the patient underwent a gastric  emptying study.  Please see the dictated note for complete results.  In  summary, the patient demonstrated a slight delay of gastric emptying at one  hour with normal emptying at two hour intervals.   HISTORY OF PRESENT ILLNESS:  The patient is a 55 year old Caucasian male  with past medical history significant for poorly controlled diabetes  mellitus who presented to the [facility] on December 08, 2002, complaining of nausea  and vomiting x2 days.  Per his report, the patient had not been taking his  insulin because he had not been eating.  He also complained of abdominal and  flank pain.  He stated this was a dull aching pain quality rated 10/10.  Given initial suspicion for nephrolithiasis, he underwent a CT scan.  This  was negative for evidence of kidney stone.  UA was without evidence of  infection.   He was called back into the [facility] on the day of admission to followup on  abdominal pain and hyperglycemia.  The patient was noted to be markedly  hyperglycemic with a CBG greater than 700.  The patient reported that his  pain had decreased from a 10/10 to a 5-6/10 and was primarily in the right  flank and right upper quadrant.  The patient denied any nausea, vomiting  since prior [facility] visit.   He, however, has had poor p.o. intake and had not  been taking his insulin.  Denied any fevers or chills.  Of note, the patient  had a similar episode of abdominal pain approximately five years prior to  this admission when no etiology was found.   PHYSICAL EXAMINATION:  VITAL SIGNS:  Temperature 97.5, pulse 97, blood  pressure 163/89.  GENERAL:  The patient was an uncomfortable appearing Caucasian male.  HEENT:  Pupils are equal, round, and reactive to light.  Extraocular  movements are intact.  Sclerae were clear.  Nares were patent.  Oropharynx  was clear without erythema or exudate.  The patient had poor dentition.  NECK:  Supple without lymphadenopathy.  CARDIOVASCULAR:  Regular rhythm without murmur, rub, or gallop.  RESPIRATIONS:  Clear to auscultation bilaterally with good air movement.  No  wheezes, crackles, or rhonchi were appreciated.  ABDOMEN:  Distended.  The patient was diffusely tender to palpation.  RECTAL:  The patient was heme-negative on rectal examination.  NEUROLOGICAL:  Nonfocal.   LABORATORY DATA:  On admission, white blood cell count 9.6, hemoglobin 15.2,  hematocrit 45.3, platelets 198,000.  Sodium 134, potassium 5.2, chloride 95,  bicarbonate 28, BUN 22, creatinine 1.8, glucose 719, anion gap of 11.  Total  bilirubin 1.9, indirect 1.6, direct 0.3, alkaline phosphatase 115, AST 23,  ALT 30, protein 7.5, albumin 4.2, calcium 9.4, lipase 19.  Urinalysis  revealed greater than 1000 glucose and small blood with 0-3 RBCs on  microscopy.   CT of the abdomen was negative for nephrolithiasis, but did demonstrate mild  bilateral perinephric stranding.   HOSPITAL COURSE:  Problem #1.  Abdominal pain:  Upon initial presentation,  the etiology of the patient's abdominal pain was not entirely clear.  The  differential diagnoses included hyperglycemia, gastritis, diabetic  gastroparesis, other diabetic gastroenteropathy, obstipation, abdominal pain secondary to substance  abuse, or gallbladder pathology.  The patient was  treated empirically with proton pump inhibitors.  He was started on an  insulin drip to improve his hyperglycemia.  He was treated with  p.r.n.  Phenergan and he was made n.p.o.  Over the course of the admission, the  patient's vomiting ceased.  He was treated with p.r.n. Phenergan and his  nausea improved.  The patient continued to complain of some abdominal pain  with bloating.  He was treated with proton pump inhibitors for possibility  of gastritis.  He was treated with an antigas medication for the possibility  this was all secondary to gas.  The patient continued to be distended.  A  gastric emptying study was performed which revealed evidence of mild slowing  at the one hour phase.  Therefore, the patient was started on Reglan.  By  the time of discharge, the patient was still having mild abdominal  discomfort, but was able to take adequate p.o. without any further nausea or  vomiting.  He was discharged home on a GI regimen including Protonix,  Reglan, and Phenergan.  The patient is to use Maalox p.r.n.  When seen in  followup, if the patient's abdominal pain is not improved, consideration  should be given to a GI consult with possible EGD.   Problem #2.  Diabetes mellitus:  The patient had not been taking his insulin  prior to admission and when he presented to the [facility] clinic his CBG was  markedly elevated at 719.  He was admitted and placed on an insulin drip.  This was discontinued once his CBGs returned to less than 200.  He was  switched to his home NPH and Regular regimen.  His CBG control during the  hospital was quite good.  Hemoglobin A1c was checked and was 8.6.  The  patient was further educated regarding diet and exercise and voiced  understanding of this.  He is to be followed up in the outpatient setting.   Problem #3.  Hypertension:  The patient was noted to be hypertensive on  admission which was felt secondary to  abdominal pain.  Blood pressure  trended down throughout the hospitalization.  The patient was not admitted  on any antihypertensive medications.  Should he remain hypertensive in the  outpatient setting patient should be started on either hydrochlorothiazide  or an ACE inhibitor.   Problem #4.  Seasonal allergies:  The patient was continued on his Zyrtec  during hospitalization.  This was also continued at the time of discharge.   Problem #5.  Gilbert's Syndrome:  The patient had an isolated elevation of  his total bilirubin upon presentation to the hospitalization.  There was  fractionated elevation of his indirect bilirubin.  This is felt to be  secondary to a Gilbert's syndrome as the patient did not have any other  evidence of gallbladder pathology.   DISCHARGE LABORATORIES:  Final available laboratories on the patient were  drawn on December 15, 2002, and were as follows:  White blood cell count 6.2, hemoglobin 12.7, hematocrit 36.2, platelets 218,000.  Sodium 143, potassium  3.6, chloride 111, bicarbonate 28, BUN 11, creatinine 1.2, glucose 85,  calcium 8.6.  LFTs within normal limits.  Amylase and lipase within normal  limits.  Protein 5.6, albumin 2.9.     Marijean Bravo, M.D.         Dineen Kid Reche Dixon, M.D.    CAM/MEDQ  D:  01/05/2003  T:  01/06/2003  Job:  [id number]   cc:   Outpatient [facility]

## 2011-01-07 NOTE — Telephone Encounter (Signed)
Tried calling patient again today- unable to reach him or leave a message.

## 2011-02-07 ENCOUNTER — Other Ambulatory Visit: Payer: Self-pay | Admitting: Dietician

## 2011-02-07 MED ORDER — ONETOUCH ULTRA 2 W/DEVICE KIT: PACK | Status: DC

## 2011-02-07 MED ORDER — ONETOUCH DELICA LANCETS MISC: Status: DC

## 2011-02-07 MED ORDER — GLUCOSE BLOOD VI STRP: ORAL_STRIP | Status: DC

## 2011-02-07 NOTE — Telephone Encounter (Signed)
Patient has new job with health insurance coverage. Can get free meter and strips for $10 copay. He requests rxs be sent to [employer] on [address] in [place]. Also schedule appointment with CDE for assistance with new schedule work hours now 10am -6 pm

## 2011-02-18 ENCOUNTER — Other Ambulatory Visit: Payer: Self-pay | Admitting: Dietician

## 2011-02-18 ENCOUNTER — Ambulatory Visit (INDEPENDENT_AMBULATORY_CARE_PROVIDER_SITE_OTHER): Payer: Self-pay | Admitting: Dietician

## 2011-02-18 DIAGNOSIS — E119 Type 2 diabetes mellitus without complications: Secondary | ICD-10-CM

## 2011-02-18 MED ORDER — "INSULIN SYRINGE 31G X 5/16"" 0.5 ML MISC": Status: AC

## 2011-02-18 MED ORDER — REPAGLINIDE 0.5 MG PO TABS: ORAL_TABLET | ORAL | Status: DC

## 2011-02-18 NOTE — Progress Notes (Signed)
Diabetes Self-Management Training (DSMT)  Initial Visit  02/18/2011 [name], identified by name and date of birth, is a 55 y.o. male with Type 2 Diabetes. Year of diabetes diagnosis: 7s Other persons present: no  ASSESSMENT Patient concerns are Medication and Glycemic control.  There were no vitals taken for this visit. There is no height or weight on file to calculate BMI. Lab Results  Component Value Date   LDLCALC 57 12/10/2010   Lab Results  Component Value Date   HGBA1C 8.9 12/10/2010   Medication Nutrition Monitor: one touch ultramini  Labs reviewed.  DIABETES BUNDLE: A1C in past 6 months? Yes.  Less than 7%? No LDL in past year? Yes.  Less than 100 mg/dL? Yes Microalbumin ratio in past year? No. Patient taking ACE or ARB? Yes. Blood pressure less than 130/80? Yes. Foot exam in last year? Yes. Eye exam in past year? Yes. Tobacco use? No. Pneumovax? Yes Flu vaccine? Yes Asprin? Yes  Family history of diabetes: No  Support systems: friends  Special needs: Engineer, structural  Prior DM Education: Yes   Medications See Medications list.  Is interested in learning more  Patients belief/attitude about diabetes: Diabetes can be controlled.  Self foot exams daily: Yes  Diabetes Complications: None   Exercise Plan Doing physical/active work and thinking of riding bicycle for    Self-Monitoring Frequency of testing: 1-2 times/day Breakfast: 100-200 Lunch: 200 Supper: 71-200 Bedtime: 150-300  Hyperglycemia: Yes Daily Hypoglycemia: Yes Daily per patient but he doesn't check his blood sugars   Meal Planning Some knowledge, Interested in improving and however does not desire to work on this today   Assessment comments: patient was out of lantus for 7 days recently- so some high blood sugars as a result 1-2 weeks ago. Still flucotuating but improved now back on lantus.    INDIVIDUAL DIABETES EDUCATION PLAN:   Medication Monitoring _______________________________________________________________________  Intervention TOPICS COVERED TODAY:  Medication  Reviewed patients medication for diabetes, action, purpose, timing of dose and side effects. Reviewed medication adjustment guidelines for hyperglycemia and sick days. Monitoring  Purpose and frequency of SMBG. Interpreting lab values - A1C, lipid, urine microalbumina.  PATIENTS GOALS/PLAN (copy and paste in patient instructions so patient receives a copy): 1.  Behavioral Objective:         Medications: To improve blood glucose levels, I will take my medication as prescribed Half of the time 50% Monitoring: To identify blood glucose trends, I will test my blood glucose 3-4 day Sometimes 25%  Personalized Follow-Up Plan for Ongoing Self Management Support:  Doctor's Office, friends and CDE visits ______________________________________________________________________   Outcomes Expected outcomes: Demonstrated interest in learning.Expect positive changes in lifestyle.  Self-care Barriers: Low literacy, and excellent math skills and does well with verbal instructions  Education material provided: written and verbal with teachback of instructions  Patient to contact team via Phone if problems or questions.  Time in: 1500     Time out: 1530  Future DSMT - 2 months   RILEY,DONNA

## 2011-02-18 NOTE — Patient Instructions (Addendum)
Check blood sugar before breakfast, before dinner and before bedtime.  Do not take regular insulin at bedtime, take it only at breakfast and dinner  Can take prandin if blood sugar > 150       150-200 take 1 prandin        201-250 take 2 prandin        251-300 take 3 prandin         301-350 take prandin        351- 400 take prandin  Please cut down regular insulin doses on work days  Cut down on sweet drinks like regular soda

## 2011-03-15 ENCOUNTER — Ambulatory Visit (INDEPENDENT_AMBULATORY_CARE_PROVIDER_SITE_OTHER): Payer: Self-pay | Admitting: Internal Medicine

## 2011-03-15 ENCOUNTER — Encounter: Payer: Self-pay | Admitting: Internal Medicine

## 2011-03-15 VITALS — BP 119/69 | HR 84 | Temp 96.6°F | Ht 72.75 in | Wt 211.8 lb

## 2011-03-15 DIAGNOSIS — N529 Male erectile dysfunction, unspecified: Secondary | ICD-10-CM

## 2011-03-15 DIAGNOSIS — E119 Type 2 diabetes mellitus without complications: Secondary | ICD-10-CM

## 2011-03-15 DIAGNOSIS — E782 Mixed hyperlipidemia: Secondary | ICD-10-CM

## 2011-03-15 DIAGNOSIS — M2550 Pain in unspecified joint: Secondary | ICD-10-CM

## 2011-03-15 DIAGNOSIS — I10 Essential (primary) hypertension: Secondary | ICD-10-CM

## 2011-03-15 DIAGNOSIS — I2510 Atherosclerotic heart disease of native coronary artery without angina pectoris: Secondary | ICD-10-CM

## 2011-03-15 MED ORDER — HYDROCODONE-ACETAMINOPHEN 5-500 MG PO TABS: 1.0000 | ORAL_TABLET | Freq: Four times a day (QID) | ORAL | Status: DC | PRN

## 2011-03-15 MED ORDER — HYDROCHLOROTHIAZIDE 25 MG PO TABS: 25.0000 mg | ORAL_TABLET | Freq: Every day | ORAL | Status: DC

## 2011-03-15 MED ORDER — NIACIN ER (ANTIHYPERLIPIDEMIC) 1000 MG PO TBCR
1000.0000 mg | EXTENDED_RELEASE_TABLET | Freq: Every day | ORAL | Status: DC
Start: 2011-03-15 — End: 2012-01-03

## 2011-03-15 MED ORDER — INSULIN NPH (HUMAN) (ISOPHANE) 100 UNIT/ML ~~LOC~~ SUSP: 35.0000 [IU] | Freq: Two times a day (BID) | SUBCUTANEOUS | Status: DC

## 2011-03-15 MED ORDER — TADALAFIL 20 MG PO TABS: 20.0000 mg | ORAL_TABLET | Freq: Every day | ORAL | Status: DC | PRN

## 2011-03-15 NOTE — Progress Notes (Signed)
Subjective:   Patient ID: Tyler Wilkinson male   DOB: Dec 10, 1955 55 y.o.   MRN: [id number]  HPI: MADDOCK FINIGAN is a 55 y.o. man who presents today for follow up of his chronic medical conditions including HTN, HLD, DM, and CAD.    He states that he has been having trouble affording his lantus for his diabetes but does take it when he gets it.  He also states that he has been using the Prandin and novolog after his discussion with Jamison Neighbor, RDE.  He has been testing his blood sugars occasionally and has been noticing that it is better.  He denies any hypoglycemia events, nausea, vomiting, increased urination, or polydipsia.  He is due for a recheck of his hemoglobin A1C today but is caught up on all his other laboratory testing.    He also states that he is seeing a new woman and is concerned because she has hepatitis C and he doesn't want to become infected.   He denies any other sexual contact and states that he was tested not that long ago for other STDs.  He denies any sexual contact with her, nausea, vomiting, flu like symptoms, darkening of the urine, yellowing of the skin, or abdominal pain.    No past medical history on file. Current Outpatient Prescriptions  Medication Sig Dispense Refill  . albuterol (PROVENTIL,VENTOLIN) 90 MCG/ACT inhaler Inhale 2 puffs into the lungs every 6 (six) hours as needed for wheezing.  17 g  12  . aspirin EC 81 MG EC tablet Take 1 tablet (81 mg total) by mouth daily.  150 tablet  2  . Blood Glucose Monitoring Suppl (ONE TOUCH ULTRA 2) W/DEVICE KIT Use to check blood sugar sup to 6 times daily Dx code 250.00  1 each  0  . carvedilol (COREG) 6.25 MG tablet Take 1 tablet (6.25 mg total) by mouth 2 (two) times daily.  60 tablet  11  . glucose blood (ONE TOUCH ULTRA TEST) test strip Use to check blood sugar up to 6 times daily. 250.00  200 each  12  . hydrochlorothiazide 25 MG tablet Take 1 tablet (25 mg total) by mouth daily.  30 tablet  2  .  HYDROcodone-acetaminophen (VICODIN) 5-500 MG per tablet Take 2 tablets by mouth every 6 (six) hours as needed for pain.  20 tablet  0  . insulin glargine (LANTUS) 100 UNIT/ML injection Take 60 units daily  20 mL  6  . insulin regular (HUMULIN R,NOVOLIN R) 100 units/mL injection Inject 15- 30 units subcutaneously with each meal, do not take at bedtime or at same time you take prandin       . Insulin Syringe-Needle U-100 (INSULIN SYRINGE .5CC/31GX5/16") 31G X 5/16" 0.5 ML MISC Use to inject insulin 3 times a day Dx code 250.00  100 each  12  . levocetirizine (XYZAL) 5 MG tablet Take 1 tablet (5 mg total) by mouth every evening.  30 tablet  11  . mometasone (NASONEX) 50 MCG/ACT nasal spray 2 sprays by Nasal route daily.  17 g  2  . montelukast (SINGULAIR) 10 MG tablet Take 1 tablet (10 mg total) by mouth daily.  30 tablet  2  . niacin (NIASPAN) 1000 MG CR tablet Take 1 tablet (1,000 mg total) by mouth at bedtime.  30 tablet  3  . ONETOUCH DELICA LANCETS MISC Use to check blood sugar sup to 6 times daily. Dx code 250.00  200 each  12  .  quinapril (ACCUPRIL) 40 MG tablet Take 1 tablet (40 mg total) by mouth at bedtime.  30 tablet  11  . repaglinide (PRANDIN) 0.5 MG tablet Take up to 12 tablets a day, take with lunch and at bedtime as instructed.  360 tablet  3  . rosuvastatin (CRESTOR) 5 MG tablet Take 1 tablet (5 mg total) by mouth at bedtime.  30 tablet  11   No family history on file. History   Social History  . Marital Status: Single    Spouse Name: N/A    Number of Children: N/A  . Years of Education: N/A   Social History Main Topics  . Smoking status: Former Games developer  . Smokeless tobacco: None  . Alcohol Use: None  . Drug Use: None  . Sexually Active: None   Other Topics Concern  . None   Social History Narrative  . None   Review of Systems: Constitutional: Denies fever, chills, diaphoresis, appetite change and fatigue.  HEENT: Denies photophobia, eye pain, redness, hearing  loss, ear pain, congestion, sore throat, rhinorrhea, sneezing, mouth sores, trouble swallowing, neck pain, neck stiffness and tinnitus.   Respiratory: Denies SOB, DOE, cough, chest tightness,  and wheezing.   Cardiovascular: Denies chest pain, palpitations and leg swelling.  Gastrointestinal: Denies nausea, vomiting, abdominal pain, diarrhea, constipation, blood in stool and abdominal distention.  Genitourinary: Denies dysuria, urgency, frequency, hematuria, flank pain and difficulty urinating.  Musculoskeletal: Denies myalgias, back pain, joint swelling, arthralgias and gait problem.  Skin: Denies pallor, rash and wound.  Neurological: Denies dizziness, seizures, syncope, weakness, light-headedness, numbness and headaches.  Hematological: Denies adenopathy. Easy bruising, personal or family bleeding history  Psychiatric/Behavioral: Denies suicidal ideation, mood changes, confusion, nervousness, sleep disturbance and agitation  Objective:  Physical Exam: Filed Vitals:   03/15/11 1620  BP: 119/69  Pulse: 84  Temp: 96.6 F (35.9 C)  TempSrc: Oral  Height: 6' 0.75" (1.848 m)  Weight: 211 lb 12.8 oz (96.072 kg)   Constitutional: Vital signs reviewed.  Patient is a well-developed, well-nourished, slightly disheveled man in no acute distress and cooperative with exam. Alert and oriented x3.  Head: Normocephalic and atraumatic Ear: TM normal bilaterally Mouth: no erythema or exudates, MMM Eyes: PERRL, EOMI, conjunctivae normal, No scleral icterus.  Neck: Supple, Trachea midline normal ROM, No JVD, mass, thyromegaly, or carotid bruit present.  Cardiovascular: RRR, S1 normal, S2 normal, no MRG, pulses symmetric and intact bilaterally Pulmonary/Chest: CTAB, no wheezes, rales, or rhonchi Abdominal: Soft. Non-tender, non-distended, bowel sounds are normal, no masses, organomegaly, or guarding present.  GU: no CVA tenderness Musculoskeletal: No joint deformities, erythema, or stiffness, ROM full  and no nontender Hematology: no cervical, inginal, or axillary adenopathy.  Neurological: A&O x3, Strength is normal and symmetric bilaterally, cranial nerve II-XII are grossly intact, no focal motor deficit, sensory intact to light touch bilaterally.  Skin: Warm, dry and intact. No rash, cyanosis, or clubbing.  Psychiatric: Normal mood and affect. speech and behavior is normal. Judgment and thought content normal. Cognition and memory are normal.   Assessment & Plan:

## 2011-03-15 NOTE — Patient Instructions (Signed)
Stop your Lantus  Start Insulin NPH 35 units twice daily with breakfast and supper.  You have to be sure to eat when you take the NPH.    Keep taking all your other medications as prescribed.  Follow up in about 3 months or so.

## 2011-03-19 NOTE — Assessment & Plan Note (Signed)
Lab Results  Component Value Date   CHOL 105 12/10/2010   CHOL 106 01/05/2010   CHOL 88 05/19/2009   Lab Results  Component Value Date   HDL 36* 12/10/2010   HDL 42 01/05/2010   HDL 32* 05/19/2009   Lab Results  Component Value Date   LDLCALC 57 12/10/2010   LDLCALC 50 01/05/2010   LDLCALC 43 05/19/2009   Lab Results  Component Value Date   TRIG 62 12/10/2010   TRIG 70 01/05/2010   TRIG 66 05/19/2009   Lab Results  Component Value Date   CHOLHDL 2.9 12/10/2010   CHOLHDL 2.5 Ratio 01/05/2010   CHOLHDL 2.8 Ratio 05/19/2009   No results found for this basename: LDLDIRECT   His LDL cholesterol is well within his goal of <100 so we will continue his same medications and continue to monitor.

## 2011-03-19 NOTE — Assessment & Plan Note (Signed)
BP Readings from Last 3 Encounters:  03/15/11 119/69  12/10/10 122/74  07/05/10 128/66   His blood pressure today is well within his goal of <130/80.  We will continue to monitor his medications.

## 2011-03-19 NOTE — Assessment & Plan Note (Signed)
He is continuing on ASA 81 mg daily for now.  He was on Plavix for sometime after his stent placement but has stopped this recently because of cost.  Per Dr. Henrietta Hoover note he was supposed to be on it as recently as 03/2010.  Since the stent was a BMS and was placed almost 7 years ago he is okay to stop Plavix from my standpoint but he was asked to discuss this with Dr. Myrtis Ser when he has his follow up.

## 2011-03-19 NOTE — Assessment & Plan Note (Signed)
Lab Results  Component Value Date   HGBA1C 7.9 03/15/2011   HGBA1C 8.9 12/10/2010   HGBA1C 8.2 06/08/2010   Lab Results  Component Value Date   MICROALBUR 0.60 01/05/2010   LDLCALC 57 12/10/2010   CREATININE 0.99 12/10/2010   A1C today is better then previously.  Which is likely due to better compliance with his diabetic medications.  We had a discussion that included Jamison Neighbor, RDE and because he is having problems affording lantus that we will try switching him to NPH for his long acting insulin.  We will start at 35 units BID with the largest meal.  He will continue to use his Prandin and Novolog with meals.  He is encouraged to test his blood sugar a bit more often as we are adjusting his medications and to be sure that he eats when he takes his NPH to avoid hypoglycemia episodes.  He does not have the best awareness of his hypoglycemia episodes so we need to be sure he is testing more often so we don't get into trouble.  I will follow up with him in a month or so to see how he is doing.

## 2011-04-15 ENCOUNTER — Inpatient Hospital Stay (INDEPENDENT_AMBULATORY_CARE_PROVIDER_SITE_OTHER)
Admission: RE | Admit: 2011-04-15 | Discharge: 2011-04-15 | Disposition: A | Discharge status: Home / Self Care | Payer: 59 | Source: Ambulatory Visit | Attending: Emergency Medicine | Admitting: Emergency Medicine

## 2011-04-15 ENCOUNTER — Telehealth: Payer: Self-pay | Admitting: *Deleted

## 2011-04-15 ENCOUNTER — Ambulatory Visit (INDEPENDENT_AMBULATORY_CARE_PROVIDER_SITE_OTHER): Payer: 59

## 2011-04-15 DIAGNOSIS — J40 Bronchitis, not specified as acute or chronic: Secondary | ICD-10-CM

## 2011-04-15 NOTE — Telephone Encounter (Signed)
Pt calls states he will die if he waits till tomorrow for an appt, blood sugar yesterday in the 200's, head/chest congestion x 2 days, sent to urg care.

## 2011-04-15 NOTE — Telephone Encounter (Signed)
Thank you :)

## 2011-05-20 LAB — GLUCOSE, CAPILLARY: Glucose-Capillary: 205 — ABNORMAL HIGH

## 2011-05-22 ENCOUNTER — Other Ambulatory Visit: Payer: Self-pay | Admitting: *Deleted

## 2011-05-22 NOTE — Telephone Encounter (Signed)
Pt walked in/ gave samples of crestor 5 mg one months worth.

## 2011-05-23 LAB — GLUCOSE, CAPILLARY: Glucose-Capillary: 136 mg/dL — ABNORMAL HIGH (ref 70–99)

## 2011-06-05 LAB — RAPID URINE DRUG SCREEN, HOSP PERFORMED
Amphetamines: NOT DETECTED
Barbiturates: NOT DETECTED
Cocaine: NOT DETECTED
Opiates: NOT DETECTED
Tetrahydrocannabinol: POSITIVE — AB

## 2011-06-05 LAB — I-STAT 8, (EC8 V) (CONVERTED LAB)
Bicarbonate: 24.9 — ABNORMAL HIGH
Glucose, Bld: 105 — ABNORMAL HIGH
Sodium: 140
TCO2: 26
pH, Ven: 7.404 — ABNORMAL HIGH

## 2011-06-05 LAB — COMPREHENSIVE METABOLIC PANEL
ALT: 28
AST: 25
Albumin: 4
Alkaline Phosphatase: 114
Chloride: 105
GFR calc Af Amer: >60
Potassium: 3.4 — ABNORMAL LOW
Sodium: 142
Total Bilirubin: 0.6
Total Protein: 6.8

## 2011-06-05 LAB — CBC
Hemoglobin: 14.6
MCHC: 34.2
MCV: 85.1
Platelets: 196
RBC: 4.97
RDW: 12.8
RDW: 12.8
WBC: 7.1

## 2011-06-05 LAB — BASIC METABOLIC PANEL
BUN: 12
Calcium: 9.1
Chloride: 105
GFR calc non Af Amer: >60
GFR calc non Af Amer: >60
Glucose, Bld: 108 — ABNORMAL HIGH
Potassium: 3.8
Potassium: 3.9
Sodium: 138
Sodium: 142

## 2011-06-05 LAB — CK TOTAL AND CKMB (NOT AT ARMC)
CK, MB: 2
CK, MB: 2.4
Relative Index: 2

## 2011-06-05 LAB — CARDIAC PANEL(CRET KIN+CKTOT+MB+TROPI)
CK, MB: 1.9
Relative Index: 1.7
Total CK: 113

## 2011-06-05 LAB — DIFFERENTIAL
Basophils Absolute: 0
Basophils Relative: 1
Eosinophils Absolute: 0.7
Monocytes Relative: 6
Neutrophils Relative %: 60

## 2011-06-05 LAB — POCT CARDIAC MARKERS
Myoglobin, poc: 84.7
Operator id: 277751

## 2011-06-05 LAB — LIPID PANEL
Cholesterol: 178
LDL Cholesterol: 120 — ABNORMAL HIGH
Total CHOL/HDL Ratio: 4.7

## 2011-06-05 LAB — LIPASE, BLOOD: Lipase: 55

## 2011-06-11 ENCOUNTER — Telehealth: Payer: Self-pay | Admitting: *Deleted

## 2011-06-11 ENCOUNTER — Emergency Department (HOSPITAL_COMMUNITY): Payer: 59

## 2011-06-11 ENCOUNTER — Inpatient Hospital Stay (HOSPITAL_COMMUNITY)
Admission: EM | Admit: 2011-06-11 | Discharge: 2011-06-12 | DRG: 287 | Disposition: A | Discharge status: Home / Self Care | Payer: 59 | Source: Ambulatory Visit | Attending: Cardiology | Admitting: Cardiology

## 2011-06-11 DIAGNOSIS — T82897A Other specified complication of cardiac prosthetic devices, implants and grafts, initial encounter: Secondary | ICD-10-CM | POA: Diagnosis present

## 2011-06-11 DIAGNOSIS — I10 Essential (primary) hypertension: Secondary | ICD-10-CM | POA: Diagnosis present

## 2011-06-11 DIAGNOSIS — Z794 Long term (current) use of insulin: Secondary | ICD-10-CM

## 2011-06-11 DIAGNOSIS — Y849 Medical procedure, unspecified as the cause of abnormal reaction of the patient, or of later complication, without mention of misadventure at the time of the procedure: Secondary | ICD-10-CM | POA: Diagnosis present

## 2011-06-11 DIAGNOSIS — I252 Old myocardial infarction: Secondary | ICD-10-CM

## 2011-06-11 DIAGNOSIS — E785 Hyperlipidemia, unspecified: Secondary | ICD-10-CM | POA: Diagnosis present

## 2011-06-11 DIAGNOSIS — Z8249 Family history of ischemic heart disease and other diseases of the circulatory system: Secondary | ICD-10-CM

## 2011-06-11 DIAGNOSIS — I2510 Atherosclerotic heart disease of native coronary artery without angina pectoris: Secondary | ICD-10-CM | POA: Diagnosis present

## 2011-06-11 DIAGNOSIS — Z79899 Other long term (current) drug therapy: Secondary | ICD-10-CM

## 2011-06-11 DIAGNOSIS — R079 Chest pain, unspecified: Secondary | ICD-10-CM

## 2011-06-11 DIAGNOSIS — G5620 Lesion of ulnar nerve, unspecified upper limb: Secondary | ICD-10-CM | POA: Diagnosis present

## 2011-06-11 DIAGNOSIS — N529 Male erectile dysfunction, unspecified: Secondary | ICD-10-CM | POA: Diagnosis present

## 2011-06-11 DIAGNOSIS — K3189 Other diseases of stomach and duodenum: Secondary | ICD-10-CM | POA: Diagnosis present

## 2011-06-11 DIAGNOSIS — Z23 Encounter for immunization: Secondary | ICD-10-CM

## 2011-06-11 DIAGNOSIS — J45909 Unspecified asthma, uncomplicated: Secondary | ICD-10-CM | POA: Diagnosis present

## 2011-06-11 DIAGNOSIS — G4730 Sleep apnea, unspecified: Secondary | ICD-10-CM | POA: Diagnosis present

## 2011-06-11 DIAGNOSIS — E119 Type 2 diabetes mellitus without complications: Secondary | ICD-10-CM | POA: Diagnosis present

## 2011-06-11 DIAGNOSIS — Z7982 Long term (current) use of aspirin: Secondary | ICD-10-CM

## 2011-06-11 DIAGNOSIS — R0789 Other chest pain: Principal | ICD-10-CM | POA: Diagnosis present

## 2011-06-11 LAB — CBC
HCT: 41.4 % (ref 39.0–52.0)
Hemoglobin: 14.8 g/dL (ref 13.0–17.0)
MCV: 82.8 fL (ref 78.0–100.0)
RBC: 5 MIL/uL (ref 4.22–5.81)
WBC: 10.5 10*3/uL (ref 4.0–10.5)

## 2011-06-11 LAB — GLUCOSE, CAPILLARY: Glucose-Capillary: 78 mg/dL (ref 70–99)

## 2011-06-11 LAB — DIFFERENTIAL
Lymphocytes Relative: 26 % (ref 12–46)
Lymphs Abs: 2.8 10*3/uL (ref 0.7–4.0)
Monocytes Relative: 6 % (ref 3–12)
Neutrophils Relative %: 62 % (ref 43–77)

## 2011-06-11 LAB — BASIC METABOLIC PANEL
BUN: 19 mg/dL (ref 6–23)
CO2: 23 mEq/L (ref 19–32)
Chloride: 102 mEq/L (ref 96–112)
Creatinine, Ser: 0.92 mg/dL (ref 0.50–1.35)
Glucose, Bld: 154 mg/dL — ABNORMAL HIGH (ref 70–99)

## 2011-06-11 LAB — CK TOTAL AND CKMB (NOT AT ARMC): Total CK: 147 U/L (ref 7–232)

## 2011-06-11 NOTE — Telephone Encounter (Signed)
Pt walked in office c/o chest pain.  pt declined 911 call, told to go to er. Pt said he would walk over to er. Pt made several excuses of why he didn't want to go/ didn't want to wait around/ wanted to know if he could leave er if they try to keep him. Tried to inform he needs to wait for timed/interval labs and the answers aren't always right away. Pt agreed and went to er but refused ambulance. Pt appeared stable, not SOB, not diaphoretic, skin color pink, speaking without grimace and standing in office not sitting. Alfonso Ramus RN

## 2011-06-12 DIAGNOSIS — I2510 Atherosclerotic heart disease of native coronary artery without angina pectoris: Secondary | ICD-10-CM

## 2011-06-12 LAB — CARDIAC PANEL(CRET KIN+CKTOT+MB+TROPI)
CK, MB: 3.5 ng/mL (ref 0.3–4.0)
CK, MB: 3.5 ng/mL (ref 0.3–4.0)
Relative Index: 2.9 — ABNORMAL HIGH (ref 0.0–2.5)
Relative Index: 3.3 — ABNORMAL HIGH (ref 0.0–2.5)
Total CK: 119 U/L (ref 7–232)
Total CK: 105 U/L (ref 7–232)
Troponin I: <0.3 ng/mL (ref <0.30)
Troponin I: <0.3 ng/mL (ref <0.30)

## 2011-06-12 LAB — HEMOGLOBIN A1C: Hgb A1c MFr Bld: 8.1 % — ABNORMAL HIGH (ref <5.7)

## 2011-06-12 LAB — BASIC METABOLIC PANEL
BUN: 18 mg/dL (ref 6–23)
CO2: 25 mEq/L (ref 19–32)
Calcium: 8.8 mg/dL (ref 8.4–10.5)
Chloride: 103 mEq/L (ref 96–112)
Creatinine, Ser: 0.92 mg/dL (ref 0.50–1.35)
GFR calc Af Amer: >90 mL/min (ref >90)
GFR calc non Af Amer: >90 mL/min (ref >90)
Glucose, Bld: 249 mg/dL — ABNORMAL HIGH (ref 70–99)
Potassium: 3.5 mEq/L (ref 3.5–5.1)
Sodium: 135 mEq/L (ref 135–145)

## 2011-06-12 LAB — CBC
HCT: 39.6 % (ref 39.0–52.0)
Hemoglobin: 14 g/dL (ref 13.0–17.0)
MCHC: 35.4 g/dL (ref 30.0–36.0)
MCV: 82.3 fL (ref 78.0–100.0)
RDW: 12 % (ref 11.5–15.5)
WBC: 8.2 10*3/uL (ref 4.0–10.5)

## 2011-06-12 LAB — PROTIME-INR
INR: 1.08 (ref 0.00–1.49)
Prothrombin Time: 14.2 seconds (ref 11.6–15.2)

## 2011-06-12 LAB — GLUCOSE, CAPILLARY
Glucose-Capillary: 199 mg/dL — ABNORMAL HIGH (ref 70–99)
Glucose-Capillary: 120 mg/dL — ABNORMAL HIGH (ref 70–99)
Glucose-Capillary: 120 mg/dL — ABNORMAL HIGH (ref 70–99)

## 2011-06-12 LAB — APTT: aPTT: 60 seconds — ABNORMAL HIGH (ref 24–37)

## 2011-06-17 ENCOUNTER — Telehealth: Payer: Self-pay

## 2011-06-17 NOTE — Telephone Encounter (Signed)
Ok per Dr Myrtis Ser for return to work slip.  Letter given to Mr Kwasnik.

## 2011-06-17 NOTE — Telephone Encounter (Signed)
Mr Weimer is requesting a back to work clearance.  (Written)

## 2011-06-19 NOTE — Cardiovascular Report (Signed)
NAME:  Tyler Wilkinson, Tyler Wilkinson NO.:  [id number]  MEDICAL RECORD NO.:  [id number]  LOCATION:                                 FACILITY:  PHYSICIAN:  Lorine Bears, MD     DATE OF BIRTH:  April 04, 1956  DATE OF PROCEDURE:  06/12/2011 DATE OF DISCHARGE:                           CARDIAC CATHETERIZATION   PROCEDURES PERFORMED: 1. Left heart catheterization. 2. Coronary angiography. 3. Left ventricular angiography.  INDICATION AND CLINICAL HISTORY:  This is a 55 year old male with known history of coronary artery disease, status post myocardial infarction in 2006.  At that time, he had an angioplasty and stent placement to a small posterolateral branch of the right coronary artery.  He presented with symptoms of chest pain, which was somewhat different from his previous anginal discomfort.  He ruled out for myocardial infarction. ECG showed no acute changes.  Due to his symptoms and known history of coronary artery disease, cardiac catheterization was recommended. Risks, benefits, and alternatives were discussed with the patient.  ACCESS:  Right radial artery.  STUDY DETAILS:  A standard informed consent was obtained.  The right radial area was prepped in a sterile fashion.  It was anesthetized with 1% lidocaine.  A 5-French sheath was placed in the right radial artery after an anterior puncture.  3 mg of verapamil was given through the sheath.  A 4800 units of unfractionated heparin was given intravenously. Coronary angiography was performed with a TIG catheter.  Left ventricular angiography was performed with a pigtail catheter.  The patient tolerated the procedure well with no immediate complications. The sheath was removed at the end of the case and a TR band was applied.  STUDY FINDINGS:  Hemodynamic findings:  Central aortic pressure is 125/68 with a mean pressure of 93 mmHg.  Left ventricular pressure is 128/11 with a left ventricular end-diastolic pressure of 18  mmHg.  Left ventricular angiography:  This showed normal LV systolic function and wall motion with an estimated ejection fraction of 65%.  Coronary angiography: 1. Left main coronary artery:  The vessel is normal in diameter, but     short, and is free of significant disease. 2. Left circumflex artery:  The vessel is normal in size and     nondominant.  It gives a very small OM 1 branch.  OM 2 is large in     size and has a 30% proximal disease.  It gives distally 2 medium-     sized branches.  OM 3 is medium in size and free of significant     disease. 3. Left anterior descending artery:  The vessel is normal in size.     There is 50% discrete lesion in the proximal segment.  In the mid     segment right after the first septal perforator, there is a 20%     tubular stenosis.  The LAD distally has diffuse 30% disease.  First     diagonal branch is normal in size with 50% ostial stenosis.  Second     and third diagonals are overall small in size. 4. Right coronary artery:  The vessel is large in size and dominant.  There is a diffuse 20% disease in the mid-segment.  It gives a     normal-sized PDA distally, which is free of significant disease.  A     stent is noted in the first posterolateral branch, which is overall     a small branch.  The stent is subtotally occluded, which appears to     be chronic.  There is some collateral flow to the distal branch.     The second posterolateral branch has a 30% proximal disease and 30%     mid stenosis.  STUDY CONCLUSIONS: 1. Occluded stent in a small posterolateral branch of the distal right     coronary artery, which seems to be chronic with collaterals.  There     is moderate disease in the left anterior descending artery and     diagonal. 2. Normal LV systolic function.  RECOMMENDATIONS:  Aggressive medical therapy is recommended.  No revascularization is advised at this time.     Lorine Bears, MD     MA/MEDQ  D:   06/12/2011  T:  06/12/2011  Job:  [id number]  Electronically Signed by Lorine Bears MD on 06/19/2011 05:44:29 PM

## 2011-06-21 ENCOUNTER — Ambulatory Visit (INDEPENDENT_AMBULATORY_CARE_PROVIDER_SITE_OTHER): Payer: 59 | Admitting: Internal Medicine

## 2011-06-21 ENCOUNTER — Encounter: Payer: Self-pay | Admitting: *Deleted

## 2011-06-21 ENCOUNTER — Encounter: Payer: Self-pay | Admitting: Cardiology

## 2011-06-21 ENCOUNTER — Encounter: Payer: Self-pay | Admitting: Internal Medicine

## 2011-06-21 ENCOUNTER — Encounter: Payer: 59 | Admitting: Internal Medicine

## 2011-06-21 DIAGNOSIS — I10 Essential (primary) hypertension: Secondary | ICD-10-CM | POA: Insufficient documentation

## 2011-06-21 DIAGNOSIS — M2550 Pain in unspecified joint: Secondary | ICD-10-CM

## 2011-06-21 DIAGNOSIS — E782 Mixed hyperlipidemia: Secondary | ICD-10-CM

## 2011-06-21 DIAGNOSIS — E785 Hyperlipidemia, unspecified: Secondary | ICD-10-CM | POA: Insufficient documentation

## 2011-06-21 DIAGNOSIS — E119 Type 2 diabetes mellitus without complications: Secondary | ICD-10-CM | POA: Insufficient documentation

## 2011-06-21 DIAGNOSIS — I2510 Atherosclerotic heart disease of native coronary artery without angina pectoris: Secondary | ICD-10-CM | POA: Insufficient documentation

## 2011-06-21 MED ORDER — HYDROCHLOROTHIAZIDE 25 MG PO TABS: 25.0000 mg | ORAL_TABLET | Freq: Every day | ORAL | Status: DC

## 2011-06-21 MED ORDER — INSULIN GLARGINE 100 UNIT/ML ~~LOC~~ SOLN: SUBCUTANEOUS | Status: DC

## 2011-06-21 MED ORDER — HYDROCODONE-ACETAMINOPHEN 5-500 MG PO TABS: 1.0000 | ORAL_TABLET | Freq: Four times a day (QID) | ORAL | Status: DC | PRN

## 2011-06-21 MED ORDER — PRAVASTATIN SODIUM 40 MG PO TABS: 40.0000 mg | ORAL_TABLET | Freq: Every day | ORAL | Status: DC

## 2011-06-21 MED ORDER — BENAZEPRIL HCL 40 MG PO TABS: 40.0000 mg | ORAL_TABLET | Freq: Every day | ORAL | Status: DC

## 2011-06-21 NOTE — Patient Instructions (Addendum)
1.  Use www.goodrx.com to help compare the price of your medications to see if you can save on them.  If they need new prescriptions let me know.  2.  We will change Crestor to Pravastatin 40 mg daily.  If you notice that you have side effects please call me as soon as possible.  We will recheck your  Cholesterol in about 3 months.  3.  Change the Quinapril to Benazepril 40 mg daily.    4. Continue all of your other medications including the Lantus 40 U every night.

## 2011-06-21 NOTE — Progress Notes (Signed)
Subjective:   Patient ID: Tyler Wilkinson male   DOB: Jan 06, 1956 55 y.o.   MRN: [id number]  HPI: Tyler Wilkinson is a 55 y.o. man who presents to clinic today for follow up of his chronic medical conditions including hypertension, hyperlipidemia, diabetes, and CAD.    He had several episodes of chest pain in the end of October and presented to his cardiologist office for one of them. He was worked into the schedule and because of his description of the chest pain was advised to go straight over to [facility] for a cardiac catheterization.  He stated that he was unable to that day so arrangements were made to have him return the next day for urgent cardiac cath.  He underwent cath by Dr. Kirke Corin on 06/11/2011 which showed a normal EF of 65%, normal Left main, diffuse OM2 disease, diffuse LAD stenosis, and a large dominant RCA with diffuse disease.  Overall they did not place any stents and recommended aggressive medical therapy.    In today's visit he has a lot of questions about his medications.  He has been lowering the dose of his lantus because of the cost from 60 to 40 and increasing the use of his regular insulin to 25-30 with meals.  His A1C done at the time of his cath was 8.1 which is mildly increased from his previous which was 7.9.    He also is wondering what he can do to work on getting his medication costs down since he his on so many medications.  He states that he takes his medications but sometimes has to go without for several days at a time because of cost.   Past Medical History  Diagnosis Date  . DM (diabetes mellitus)   . Hypertension   . Erectile dysfunction   . Dyslipidemia   . CAD (coronary artery disease)     stent RCA 2006 (other residual disease). /  nuclear 2008  no ischemia, inferolateral scar.  . Ejection fraction     EF 55%,echo, 2008, inferolateral hypokinesis,  . HLD (hyperlipidemia)    Current Outpatient Prescriptions  Medication Sig Dispense Refill   . albuterol (PROVENTIL,VENTOLIN) 90 MCG/ACT inhaler Inhale 2 puffs into the lungs every 6 (six) hours as needed for wheezing.  17 g  12  . aspirin EC 81 MG EC tablet Take 1 tablet (81 mg total) by mouth daily.  150 tablet  2  . Blood Glucose Monitoring Suppl (ONE TOUCH ULTRA 2) W/DEVICE KIT Use to check blood sugar sup to 6 times daily Dx code 250.00  1 each  0  . carvedilol (COREG) 6.25 MG tablet Take 1 tablet (6.25 mg total) by mouth 2 (two) times daily.  60 tablet  11  . clopidogrel (PLAVIX) 75 MG tablet Take 75 mg by mouth daily.        . Flaxseed, Linseed, (FLAX SEED OIL PO) Take by mouth daily.        Marland Kitchen glucose blood (ONE TOUCH ULTRA TEST) test strip Use to check blood sugar up to 6 times daily. 250.00  200 each  12  . hydrochlorothiazide 25 MG tablet Take 1 tablet (25 mg total) by mouth daily.  30 tablet  2  . HYDROcodone-acetaminophen (VICODIN) 5-500 MG per tablet Take 1-2 tablets by mouth every 6 (six) hours as needed for pain.  30 tablet  0  . insulin NPH (HUMULIN N) 100 UNIT/ML injection Inject 35 Units into the skin 2 (two) times daily  before a meal.  30 mL  3  . insulin regular (HUMULIN R,NOVOLIN R) 100 units/mL injection Inject 15- 30 units subcutaneously with each meal, do not take at bedtime or at same time you take prandin       . Insulin Syringe-Needle U-100 (INSULIN SYRINGE .5CC/31GX5/16") 31G X 5/16" 0.5 ML MISC Use to inject insulin 3 times a day Dx code 250.00  100 each  12  . levocetirizine (XYZAL) 5 MG tablet Take 1 tablet (5 mg total) by mouth every evening.  30 tablet  11  . mometasone (NASONEX) 50 MCG/ACT nasal spray 2 sprays by Nasal route daily.  17 g  2  . montelukast (SINGULAIR) 10 MG tablet Take 1 tablet (10 mg total) by mouth daily.  30 tablet  2  . niacin (NIASPAN) 1000 MG CR tablet Take 1 tablet (1,000 mg total) by mouth at bedtime.  30 tablet  3  . ONETOUCH DELICA LANCETS MISC Use to check blood sugar sup to 6 times daily. Dx code 250.00  200 each  12  .  quinapril (ACCUPRIL) 40 MG tablet Take 1 tablet (40 mg total) by mouth at bedtime.  30 tablet  11  . repaglinide (PRANDIN) 0.5 MG tablet Take up to 12 tablets a day, take with lunch and at bedtime as instructed.  360 tablet  3  . rosuvastatin (CRESTOR) 5 MG tablet Take 1 tablet (5 mg total) by mouth at bedtime.  30 tablet  11  . sildenafil (VIAGRA) 100 MG tablet Take 50 mg by mouth daily as needed.         No family history on file. History   Social History  . Marital Status: Single    Spouse Name: N/A    Number of Children: N/A  . 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 Smoking status: Former Games developer  . Smokeless tobacco: None  . Alcohol Use: None  . Drug Use: None  . Sexually Active: None   Other Topics Concern  . None   Social History Narrative  . None   Review of Systems: Constitutional: Denies fever, chills, diaphoresis, appetite change and fatigue.  HEENT: Denies photophobia, eye pain, redness, hearing loss, ear pain, congestion, sore throat, rhinorrhea, sneezing, mouth sores, trouble swallowing, neck pain, neck stiffness and tinnitus.   Respiratory: Denies SOB, DOE, cough, chest tightness,  and wheezing.   Cardiovascular: Denies chest pain, palpitations and leg swelling.  Gastrointestinal: Denies nausea, vomiting, abdominal pain, diarrhea, constipation, blood in stool and abdominal distention.  Genitourinary: Denies dysuria, urgency, frequency, hematuria, flank pain and difficulty urinating.  Musculoskeletal: Denies myalgias, back pain, joint swelling, arthralgias and gait problem.  Skin: Denies pallor, rash and wound.  Neurological: Denies dizziness, seizures, syncope, weakness, light-headedness, numbness and headaches.  Hematological: Denies adenopathy. Easy bruising, personal or family bleeding history  Psychiatric/Behavioral: Denies suicidal ideation, mood changes, confusion,  nervousness, sleep disturbance and agitation  Objective:  Physical Exam: Filed Vitals:   06/21/11 1544  BP: 140/75  Pulse: 77  Temp: 97.2 F (36.2 C)  TempSrc: Oral  Height: 6' 0.5" (1.842 m)  Weight: 216 lb 8 oz (98.204 kg)   Constitutional: Vital signs reviewed.  Patient is a well-developed and well-nourished man in no acute distress and cooperative with exam. Alert and oriented x3.  Head: Normocephalic and atraumatic Ear: TM normal bilaterally Mouth: no erythema or exudates, MMM Eyes: PERRL, EOMI,  conjunctivae normal, No scleral icterus.  Neck: Supple, Trachea midline normal ROM, No JVD, mass, thyromegaly, or carotid bruit present.  Cardiovascular: RRR, S1 normal, S2 normal, no MRG, pulses symmetric and intact bilaterally Pulmonary/Chest: CTAB, no wheezes, rales, or rhonchi Abdominal: Soft. Non-tender, non-distended, bowel sounds are normal, no masses, organomegaly, or guarding present.  GU: no CVA tenderness Musculoskeletal: No joint deformities, erythema, or stiffness, ROM full and no nontender Hematology: no cervical, inginal, or axillary adenopathy.  Neurological: A&O x3, Strenght is normal and symmetric bilaterally, cranial nerve II-XII are grossly intact, no focal motor deficit, sensory intact to light touch bilaterally.  Skin: Warm, dry and intact. No rash, cyanosis, or clubbing.  Psychiatric: Normal mood and affect. speech and behavior is normal. Judgment and thought content normal. Cognition and memory are normal.   Assessment & Plan:

## 2011-06-21 NOTE — Discharge Summary (Signed)
  NAMEBERTRAN, Tyler NO.:  [id number]  MEDICAL RECORD NO.:  [id number]  LOCATION:  2039                         FACILITY:  [facility]  PHYSICIAN:  Tyler Abed, MD, FACCDATE OF BIRTH:  [birth year]  DATE OF ADMISSION:  06/11/2011 DATE OF DISCHARGE:  06/12/2011                              DISCHARGE SUMMARY   PROCEDURES: 1. Cardiac catheterization. 2. Coronary arteriogram. 3. Left ventriculogram. 4. Portable chest x-ray.  PRIMARY FINAL DISCHARGE DIAGNOSIS:  Chest pain, no critical coronary artery disease at cath and medical therapy recommended.  SECONDARY DIAGNOSES: 1. Diabetes. 2. Hyperlipidemia. 3. History of left carpal tunnel. 4. Ulnar neuropathy. 5. Hypertension. 6. History of allergic rhinitis. 7. Intrinsic asthma. 8. Dyspepsia. 9. ET. 10.Sleep apnea. 11.Family history of premature coronary artery disease. 12.Status post anterior myocardial infarction in 2007, with bare-metal     stent to the PL Wilkinson. 13.Chronic bronchitis by chest x-ray.  TIME AT DISCHARGE:  32 minutes.  HOSPITAL COURSE:  Tyler Wilkinson is a 55 year old male with known coronary artery disease.  He had chest pain and came to the hospital where he was admitted for further evaluation and treatment.  His cardiac enzymes were negative for MI.  Although his MB was less than or equal to 4, he had some elevation in his index.  His troponins were within normal limits, but there was concern for coronary cause of his symptoms so he was taken to the cath lab on June 12, 2011.  The cardiac catheterization showed nonobstructive disease and medical therapy is recommended.  Post procedure, his cath site is stable and he is not having chest pain.  He is considered stable for discharge on June 12, 2011, to follow up as an outpatient.  DISCHARGE INSTRUCTIONS:  His activity level is to be increased gradually with no driving for 24 hours and no lifting for a week.  He is to call our  office for problems with the cath site.  He is encouraged to stick to a low-sodium diabetic diet.  He is to follow up with Dr. Myrtis Wilkinson on June 26, 2011, and with Dr. Tonny Wilkinson as needed.  DISCHARGE MEDICATIONS: 1. Quinapril 40 mg daily. 2. Coreg 6.25 mg b.i.d. 3. Humulin N 25-30 units daily as prior to admission. 4. Lantus 36 units daily. 5. Singulair 10 mg daily. 6. Prandin 1 mg 2-4 tablets at bedtime as prior to admission. 7. Aspirin 81 mg a day. 8. Flaxseed oil daily. 9. HCTZ 25 mg daily. 10.Xyzal 5 mg daily. 11.Pravachol 40 mg daily.     Tyler Demark, PA-C   ______________________________ Tyler Abed, MD, [facility]    RB/MEDQ  D:  06/12/2011  T:  06/13/2011  Job:  [id number]  cc:   Tyler Sias, MD  Electronically Signed by Tyler Demark PA-C on 06/20/2011 04:02:46 PM Electronically Signed by Tyler Rough MD FACC on 06/21/2011 08:42:41 AM

## 2011-06-21 NOTE — H&P (Signed)
NAMETYREAK, REAGLE NO.:  [id number]  MEDICAL RECORD NO.:  [id number]  LOCATION:  2039                         FACILITY:  [facility]  PHYSICIAN:  Jesse Sans. Madalynn Pickelsimer, MD, FACCDATE OF BIRTH:  04-02-1956  DATE OF ADMISSION:  06/11/2011 DATE OF DISCHARGE:                             HISTORY & PHYSICAL   PRIMARY CARDIOLOGIST:  [name], MD, [facility]  ADMITTING CARDIOLOGIST:  Jesse Sans. Raelene Trew, MD, [facility]  CHIEF COMPLAINT:  Chest tightness and pressure while lifting rolls of molding yesterday afternoon.  HISTORY OF PRESENT ILLNESS:  [name] is a very pleasant 55 year old white male, who has a history of coronary artery disease status post an inferior Hodges Treiber myocardial infarction on August 16, 2005.  At that time, he had an emergent catheterization showing anterior apical Jodye Scali hypokinesia, EF 45%.  He had a 50%-60% proximal LAD and mid and distal nonobstructive disease with TIMI-3 flow.  The diagonal 1 had a 20%-30% ostial and mid stenosis.  Left circumflex had a 30%-40% OM1 and a 30%- 40% in a very proximal OM2.  There was a 20%-25% lesion in the right coronary artery that was large.  The posterolateral ventricular branch was 100% occluded, which was a small vessel with TIMI 0 flow.  He underwent successful PTCA and stenting of the posterolateral branch with a Mini-Vision stent.  Since that time, he has done well.  He has been followed by Dr. Myrtis Ser in the office.  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  It did not radiate.  It lasted for about 5 minutes.  He says the worst pain he has ever had.  It was somewhat similar to his acute MI pain.  It was across his shoulders, initially in the back.  He went home.  Today, he worked hard without any recurrent discomfort.  However, midafternoon he was dumping trash in a dumpster and felt a pull in left side of his chest.  It remained.  He went  to the [place] office and was sent here.  Here, nitroglycerin paste relieved  it.  His initial EKG shows normal sinus rhythm with no ST-segment changes, other than residual small cues inferiorly.  His troponin is negative.  Other blood work is negative and unremarkable thus far.  PAST MEDICAL HISTORY:  CURRENT MEDICATIONS: 1. Aspirin 81 mg 2 a day. 2. Hydrochlorothiazide 25 mg once a day. 3. Vicodin 5/500 one to two tablets p.r.n. q.6 h. 4. Insulin NPH 35 units subcu 2 times daily before meals. 5. Insulin regular 100 units/mL injection p.r.n. 6. Xyzal 5 mg p.o. every evening. 7. Nasonex nasal spray, 2 sprays daily. 8. Singulair 10 mg tablet daily. 9. Niaspan, which he has run out of a 1000 mg at bedtime. 10.Quinapril 40 mg p.o. at bedtime. 11.Prandin 0.5 mg. 12.Rosuvastatin 5 mg p.o. at bedtime.  ALLERGIES:  He has no known drug allergies.  OTHER MEDICAL PROBLEMS: 1. Type 2 diabetes, insulin dependent. 2. Hyperlipidemia. 3. Left carpal tunnel syndrome. 4. Ulnar neuropathy. 5. Hypertension. 6. Allergic rhinitis. 7. Intrinsic asthma. 8. Dyspepsia. 9. Erectile dysfunction. 10.Sleep apnea.  PREVIOUS SURGERIES:  None other than the above.  SOCIAL HISTORY:  Lives alone.  He works a Producer, television/film/video jobs.  He is followed by Dr. Myrtis Ser in the Cardiology Clinic and by the Internal Medicine Program here.  He is financially very stressed.  He has a daughter.  He does not smoke.  He rarely drinks.  FAMILY HISTORY:  Unremarkable for premature coronary disease.  Other history is noted in the file in previous records.  PHYSICAL EXAMINATION:  VITAL SIGNS:  His blood pressure is 120/80, his pulse is 65 and regular, he is in sinus rhythm, sats 94% on room air, respirations 18. HEENT:  PERRLA.  Extraocular movements intact.  Sclerae are clear. Facial symmetry is normal.  He is bearded.  Dentition is in bad repair. NECK:  Supple.  Carotids upstrokes are equal without bruits.  No JVD. Thyroid  is not enlarged.  Trachea is midline. LUNGS:  Clear to auscultation and percussion. HEART:  Poorly appreciated PMI.  Soft S1 and S2 that splits.  No murmur. ABDOMEN:  Soft, good bowel sounds.  No midline bruit.  No obvious hepatomegaly. EXTREMITIES:  No cyanosis, clubbing, or edema.  Pulses are intact. NEURO:  Intact. PSYCHIATRIC:  Normal affect.  ASSESSMENT: 1. Unstable angina. 2. Insulin-dependent diabetes.  Last hemoglobin A1c was 7.9%, so     suboptimal control. 3. Hypertension. 4. Hyperlipidemia. 5. Other problems as listed above.  PLAN: 1. Admit to telemetry. 2. Cardiac markers q.8 h. x3. 3. IV heparin. 4. Continue other medications. 5. Cardiac cath in the morning. Indications, risks, potential benefits, expectations or outcome explained.  The patient agrees to proceed.     Thaily Hackworth C. Daleen Squibb, MD, [facility]     TCW/MEDQ  D:  06/11/2011  T:  06/12/2011  Job:  [id number]  Electronically Signed by Valera Castle MD [facility] on 06/21/2011 01:39:05 PM

## 2011-06-24 ENCOUNTER — Ambulatory Visit (INDEPENDENT_AMBULATORY_CARE_PROVIDER_SITE_OTHER): Payer: 59 | Admitting: Cardiology

## 2011-06-24 ENCOUNTER — Encounter: Payer: Self-pay | Admitting: Cardiology

## 2011-06-24 VITALS — BP 136/62 | HR 80 | Ht 72.0 in | Wt 218.0 lb

## 2011-06-24 DIAGNOSIS — E785 Hyperlipidemia, unspecified: Secondary | ICD-10-CM

## 2011-06-24 DIAGNOSIS — I2510 Atherosclerotic heart disease of native coronary artery without angina pectoris: Secondary | ICD-10-CM

## 2011-06-24 DIAGNOSIS — I10 Essential (primary) hypertension: Secondary | ICD-10-CM

## 2011-06-24 DIAGNOSIS — R0989 Other specified symptoms and signs involving the circulatory and respiratory systems: Secondary | ICD-10-CM

## 2011-06-24 DIAGNOSIS — R9430 Abnormal result of cardiovascular function study, unspecified: Secondary | ICD-10-CM

## 2011-06-24 NOTE — Assessment & Plan Note (Signed)
Blood pressure control. No change in therapy. 

## 2011-06-24 NOTE — Assessment & Plan Note (Signed)
The patient has intermittently been on some Crestor and had good results.  However he has not been able to afford it regularly.  Now he is on Pravachol 40 he can afford it and take it regularly.  We'll plan a followup lipid profile in 6 weeks.  We want to try to get him to goal and keep him there.  His LDL has been as low as 57 in the past.

## 2011-06-24 NOTE — Patient Instructions (Signed)
Your physician wants you to follow-up in: 6 months.  You will receive a reminder letter in the mail two months in advance. If you don't receive a letter, please call our office to schedule the follow-up appointment.  Your physician recommends that you return for lab work in:  Lipid in 6 months

## 2011-06-24 NOTE — Progress Notes (Signed)
HPI Patient is here for cardiology followup.Marland Kitchen He is also seen post hospitalization.   He has known coronary disease.  He received a stent to the right coronary artery in 2006.  There was other residual disease.  Nuclear scan 2008 showed no ischemia.  There was an inferolateral scar.  Echo in 2008 revealed ejection fraction 50-55%.  There was mild inferoposterior hypokinesis.  The patient then presented with some chest discomfort in the hospital recently he was admitted.  Enzymes were negative.  Cardiac catheterization was done.  This study showed that the stent in a small posterior lateral branch of the distal right coronary artery was occluded.  There were some chronic collaterals.  There were no high-grade lesions that required intervention.  LV function was normal.  He was discharged home and he is now back for followup.  He is feeling well.  Is not having any chest pain.  As part of his evaluation today I have carefully reviewed the hospital discharge summary in the catheter report.  I have reviewed the labs and had a full discussion with him. No Known Allergies  Current Outpatient Prescriptions  Medication Sig Dispense Refill  . albuterol (PROVENTIL,VENTOLIN) 90 MCG/ACT inhaler Inhale 2 puffs into the lungs every 6 (six) hours as needed for wheezing.  17 g  12  . aspirin EC 81 MG EC tablet Take 1 tablet (81 mg total) by mouth daily.  150 tablet  2  . benazepril (LOTENSIN) 40 MG tablet Take 1 tablet (40 mg total) by mouth daily.  30 tablet  11  . Blood Glucose Monitoring Suppl (ONE TOUCH ULTRA 2) W/DEVICE KIT Use to check blood sugar sup to 6 times daily Dx code 250.00  1 each  0  . carvedilol (COREG) 6.25 MG tablet Take 1 tablet (6.25 mg total) by mouth 2 (two) times daily.  60 tablet  11  . Flaxseed, Linseed, (FLAX SEED OIL PO) Take by mouth daily.        Marland Kitchen glucose blood (ONE TOUCH ULTRA TEST) test strip Use to check blood sugar up to 6 times daily. 250.00  200 each  12  .  hydrochlorothiazide (HYDRODIURIL) 25 MG tablet Take 1 tablet (25 mg total) by mouth daily.  30 tablet  6  . HYDROcodone-acetaminophen (VICODIN) 5-500 MG per tablet Take 1-2 tablets by mouth every 6 (six) hours as needed for pain.  30 tablet  3  . insulin glargine (LANTUS) 100 UNIT/ML injection Take 60 units daily  20 mL  6  . insulin regular (HUMULIN R,NOVOLIN R) 100 units/mL injection Inject 15- 30 units subcutaneously with each meal, do not take at bedtime or at same time you take prandin       . Insulin Syringe-Needle U-100 (INSULIN SYRINGE .5CC/31GX5/16") 31G X 5/16" 0.5 ML MISC Use to inject insulin 3 times a day Dx code 250.00  100 each  12  . levocetirizine (XYZAL) 5 MG tablet Take 1 tablet (5 mg total) by mouth every evening.  30 tablet  11  . montelukast (SINGULAIR) 10 MG tablet Take 1 tablet (10 mg total) by mouth daily.  30 tablet  2  . niacin (NIASPAN) 1000 MG CR tablet Take 1 tablet (1,000 mg total) by mouth at bedtime.  30 tablet  3  . ONETOUCH DELICA LANCETS MISC Use to check blood sugar sup to 6 times daily. Dx code 250.00  200 each  12  . pravastatin (PRAVACHOL) 40 MG tablet Take 1 tablet (40 mg total) by  mouth daily.  30 tablet  11  . repaglinide (PRANDIN) 0.5 MG tablet Take up to 12 tablets a day, take with lunch and at bedtime as instructed.  360 tablet  3  . sildenafil (VIAGRA) 100 MG tablet Take 50 mg by mouth daily as needed.          History   Social History  . Marital Status: Single    Spouse Name: N/A    Number of Children: N/A  . 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 Smoking status: Former Games developer  . Smokeless tobacco: Not on file  . Alcohol Use: Not on file  . Drug Use: Not on file  . Sexually Active: Not on file   Other Topics Concern  . Not on file   Social History Narrative  . No narrative on file    No family history on file.  Past Medical History    Diagnosis Date  . DM (diabetes mellitus)   . Hypertension   . Erectile dysfunction   . Dyslipidemia   . CAD (coronary artery disease)     stent RCA 2006 (other residual disease). /  nuclear 2008  no ischemia, inferolateral scar.  . Ejection fraction     EF 55%,echo, 2008, inferolateral hypokinesis,  . HLD (hyperlipidemia)     Past Surgical History  Procedure Date  . Coronary stent placement     ROS  Patient denies fever, chills, headache, sweats, rash, change in vision, change in hearing, chest pain, cough, nausea vomiting, urinary symptoms.  All of the systems are reviewed and are negative. PHYSICAL EXAM Patient is oriented to person time and place.  Affect is normal.  Head is atraumatic.  There is no jugular venous distention.  Lungs are clear.  Respiratory effort is nonlabored.  Cardiac exam reveals and S2.  No clicks or significant murmurs.  The abdomen is soft.  There is no peripheral edema.  No musculoskeletal deformities.  There no skin rashes.  The catheter site in his right radial has healed very nicely. Filed Vitals:   06/24/11 1617  BP: 136/62  Pulse: 80  Height: 6' (1.829 m)  Weight: 218 lb (98.884 kg)    EKG is not done today.  ASSESSMENT & PLAN

## 2011-06-24 NOTE — Assessment & Plan Note (Signed)
Coronary disease is stable.  See the catheter data.  I drew a picture for him.He is able to work full level of work.

## 2011-06-24 NOTE — Progress Notes (Signed)
Tyler Wilkinson Was recently in the hospital with chest pain.  He had all of the appropriate heart tests.  He followed all directions of the heart doctors.  His heart is stable.  He is able to work full-time.  He did not have heart attack.  He followed all of our directions.  The purpose of this note is to document that Tyler Wilkinson is clear to work his full duties.

## 2011-07-05 ENCOUNTER — Encounter: Payer: Self-pay | Admitting: Dietician

## 2011-07-19 NOTE — Assessment & Plan Note (Signed)
Lab Results  Component Value Date   CHOL 105 12/10/2010   CHOL 106 01/05/2010   CHOL 88 05/19/2009   Lab Results  Component Value Date   HDL 36* 12/10/2010   HDL 42 01/05/2010   HDL 32* 05/19/2009   Lab Results  Component Value Date   LDLCALC 57 12/10/2010   LDLCALC 50 01/05/2010   LDLCALC 43 05/19/2009   Lab Results  Component Value Date   TRIG 62 12/10/2010   TRIG 70 01/05/2010   TRIG 66 05/19/2009   Lab Results  Component Value Date   CHOLHDL 2.9 12/10/2010   CHOLHDL 2.5 Ratio 01/05/2010   CHOLHDL 2.8 Ratio 05/19/2009   No results found for this basename: LDLDIRECT   His LDL has been well controlled for sometime.  He states that the crestor is very expensive and he is having a hard time affording it with all his medications.  We will attempt to switch him to Pravastatin because of cost.  We will have him start pravastatin 40 mg daily and have him follow up for a fasting lipid panel in about 3 months.  If he is not below his goal at that time we will have to switch him back to Crestor and work to help him afford his medication.

## 2011-07-19 NOTE — Assessment & Plan Note (Signed)
Lab Results  Component Value Date   NA 135 06/12/2011   K 3.5 06/12/2011   CL 103 06/12/2011   CO2 25 06/12/2011   BUN 18 06/12/2011   CREATININE 0.92 06/12/2011   CREATININE 0.99 12/10/2010    BP Readings from Last 3 Encounters:  06/24/11 136/62  06/21/11 140/75  03/15/11 119/69    Assessment: Hypertension control:  controlled  Progress toward goals:  at goal Barriers to meeting goals:  financial need  Plan: Hypertension treatment:  We will switch his Quinapril to Benazapril because it is on the 4 dollar list at his pharmacy and continue to follow.  We will use equivalent doses.

## 2011-07-19 NOTE — Assessment & Plan Note (Signed)
We will need aggressive risk factor management in this man with his diffuse disease.  That includes good management of his diabetes, lipids, and blood pressure.  We will work with Dr. Myrtis Ser to help him afford his medications and keep him healthy.

## 2011-07-19 NOTE — Assessment & Plan Note (Signed)
Lab Results  Component Value Date   HGBA1C 8.1* 06/12/2011   HGBA1C 7.9 03/15/2011   HGBA1C 8.9 12/10/2010   Lab Results  Component Value Date   MICROALBUR 0.60 01/05/2010   LDLCALC 57 12/10/2010   CREATININE 0.92 06/12/2011   His A1C has been relatively stable.  We will continue his Lantus at 40 units daily and his regular insulin at the current doses.  I encouraged him to bring his meter to his follow up so we can adjust his insulin at that time and try to get him even better control.

## 2011-08-08 IMAGING — CR DG CHEST 2V
2 series · 2 of 2 positions shown · non-contrast
Comparison: Chest x-ray 08/08/2010.

CLINICAL DATA: Shortness of breath.

CHEST - 2 VIEW

[view not recorded (1 of 2)]
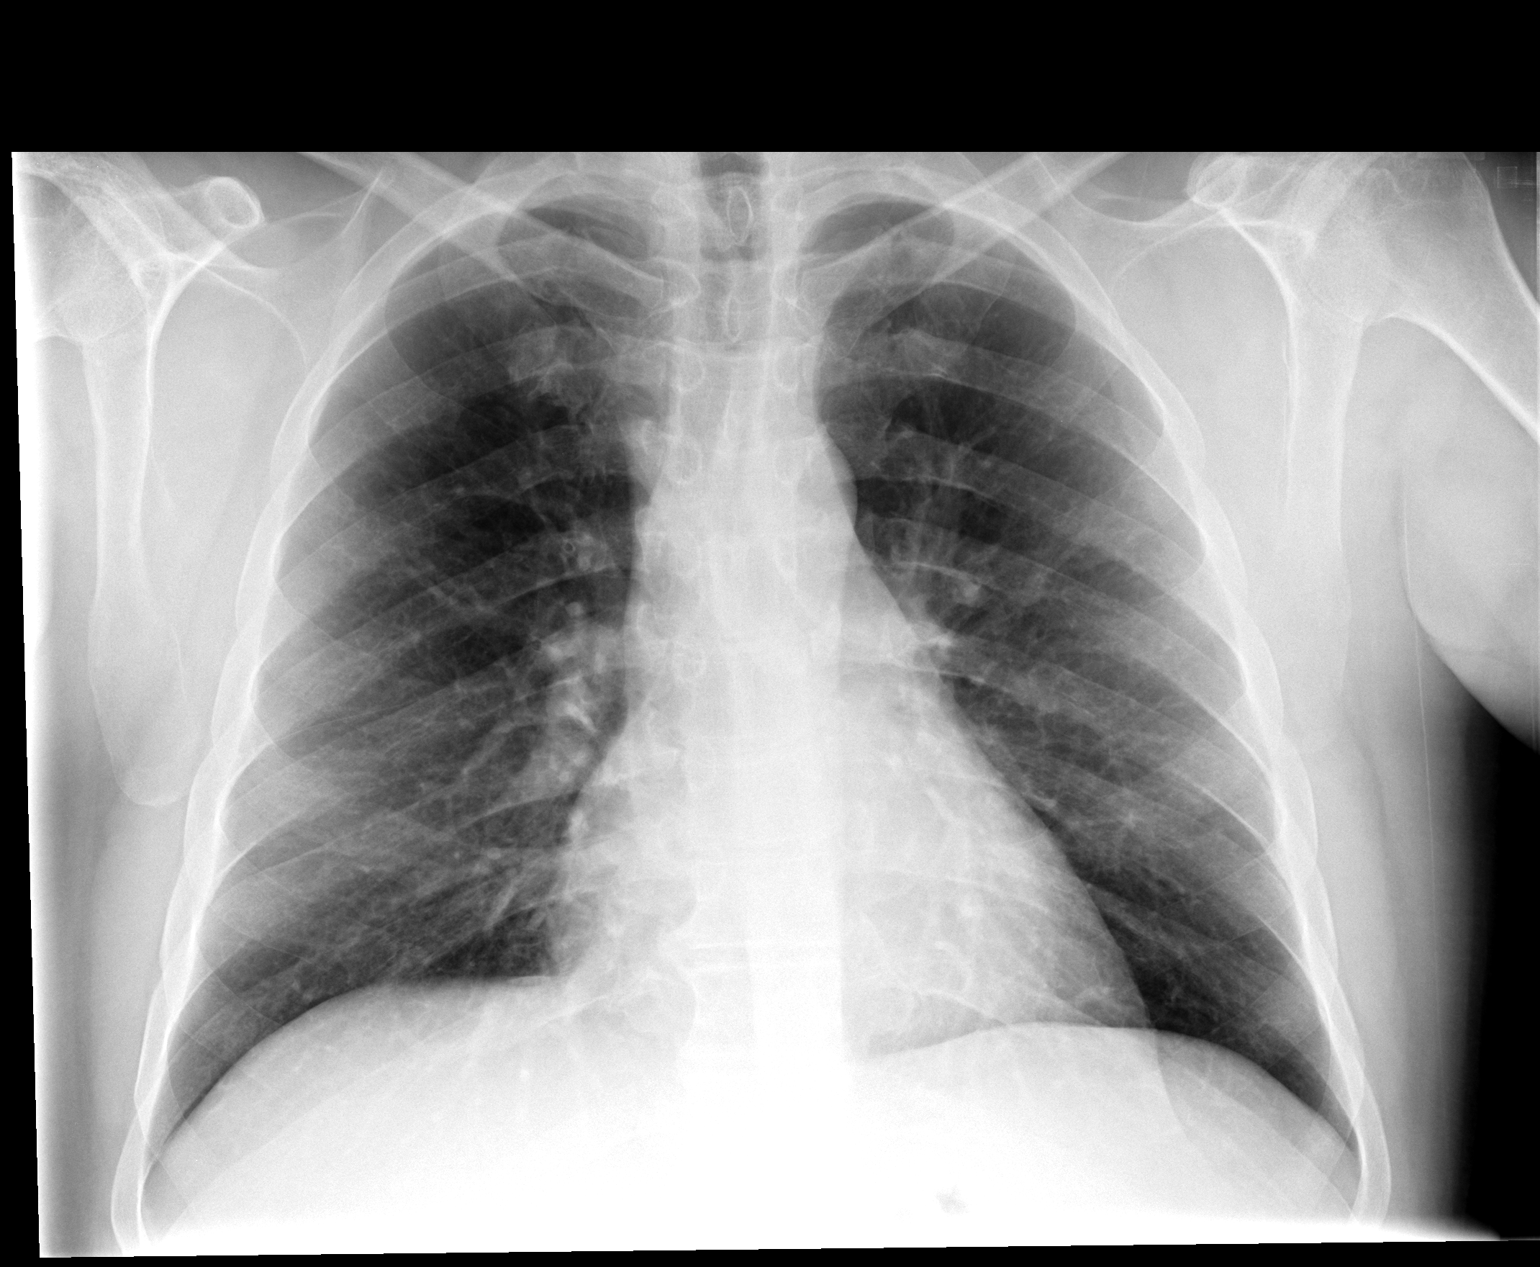

[view not recorded (2 of 2)]
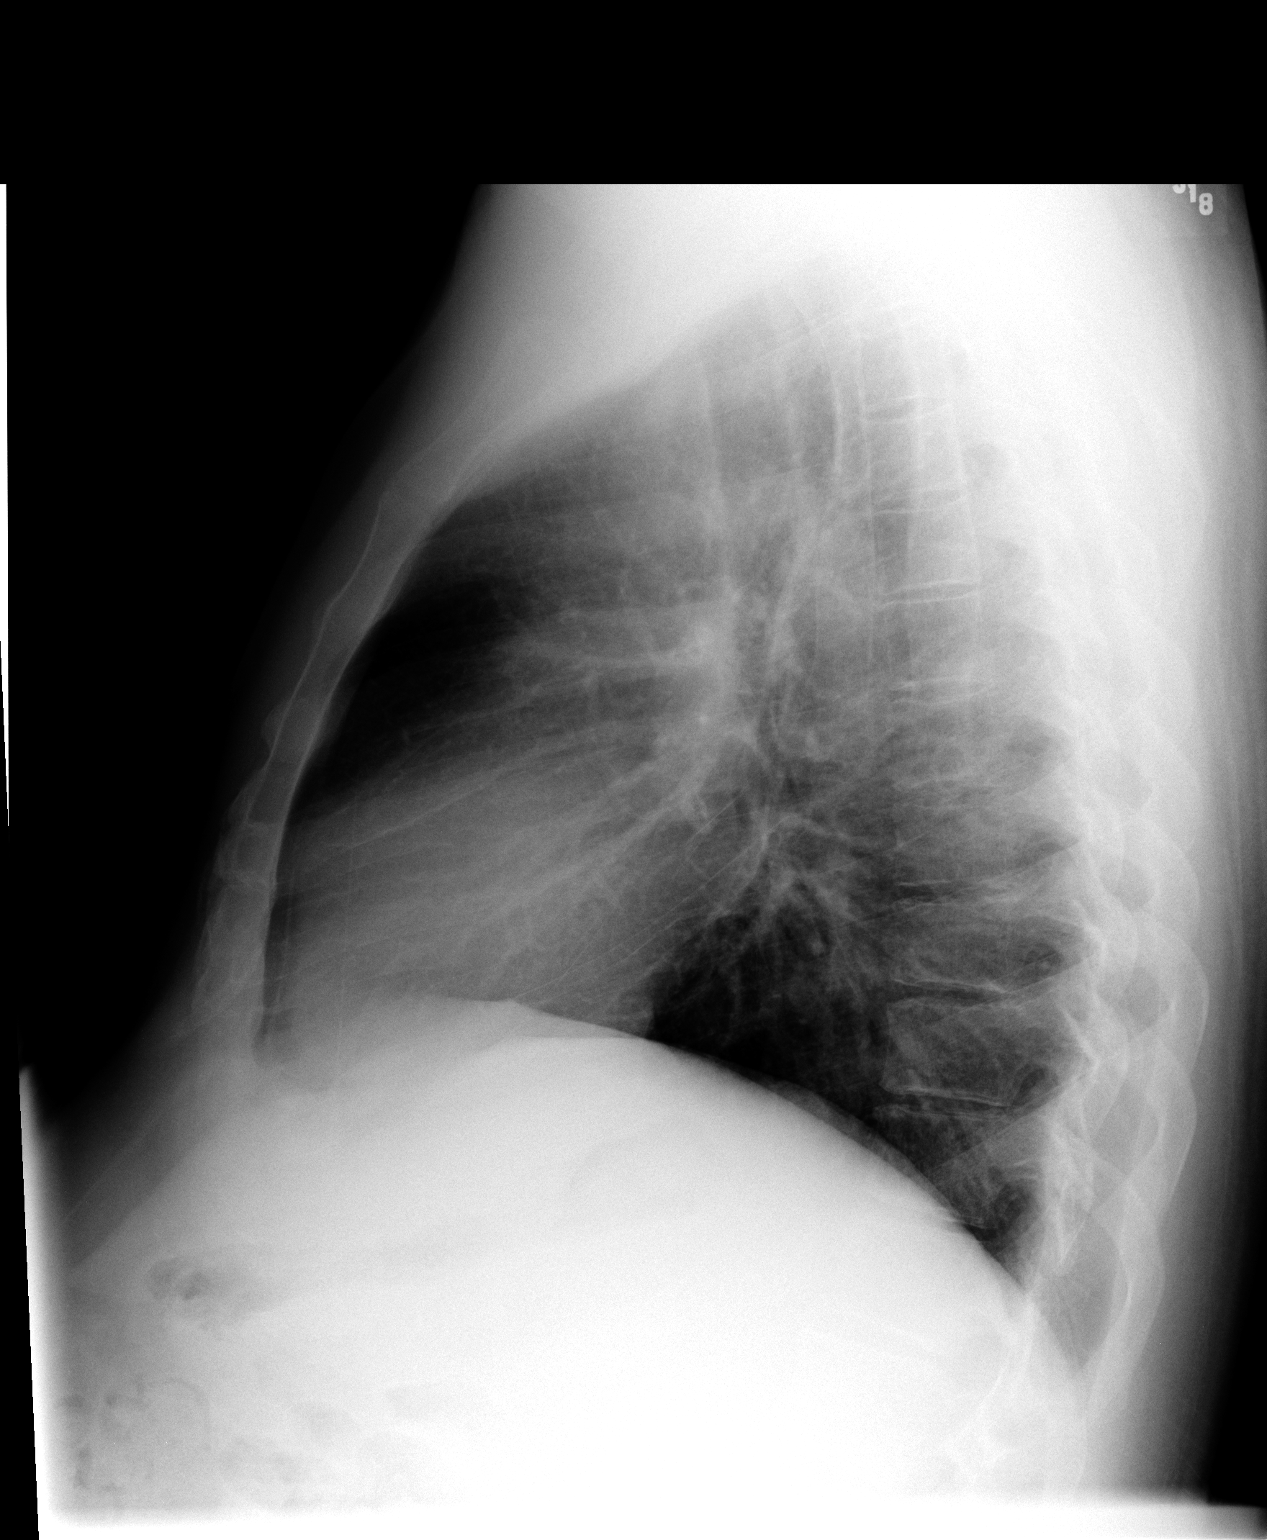

[2 of 2 positions shown; findings below may reference images not displayed]

FINDINGS: The cardiac silhouette, mediastinal and hilar contours
are within normal limits and stable.  There are mild bronchitic
type lung changes but no acute pulmonary findings.  The bony thorax
is intact.
IMPRESSION: 1.  Mild bronchitic type lung changes.
2.  No infiltrates, edema or effusions.

## 2011-09-23 ENCOUNTER — Ambulatory Visit: Payer: 59 | Admitting: Internal Medicine

## 2011-11-26 ENCOUNTER — Other Ambulatory Visit: Payer: Self-pay | Admitting: *Deleted

## 2011-11-26 DIAGNOSIS — I10 Essential (primary) hypertension: Secondary | ICD-10-CM

## 2011-11-26 MED ORDER — CARVEDILOL 6.25 MG PO TABS: 6.2500 mg | ORAL_TABLET | Freq: Two times a day (BID) | ORAL | Status: DC

## 2011-11-26 NOTE — Telephone Encounter (Signed)
Message sent to front office for an appt.  

## 2011-11-26 NOTE — Telephone Encounter (Signed)
Mr. Tones needs a follow up appointment and no showed to Dr. Clyde Lundborg in February.  Please make sure he has a follow up appointment.

## 2011-11-27 ENCOUNTER — Other Ambulatory Visit: Payer: Self-pay | Admitting: *Deleted

## 2011-11-27 NOTE — Telephone Encounter (Signed)
ERROR

## 2011-12-15 ENCOUNTER — Other Ambulatory Visit: Payer: Self-pay | Admitting: Internal Medicine

## 2011-12-15 NOTE — Telephone Encounter (Signed)
Pharmacy called on call physician to ask for refill for humulin. I provided 10mL with 4 refills to avoid pharmacy having to contact Dr. Tonny Branch as well for more than 1 month supply.

## 2011-12-16 ENCOUNTER — Other Ambulatory Visit: Payer: Self-pay | Admitting: *Deleted

## 2011-12-16 DIAGNOSIS — J302 Other seasonal allergic rhinitis: Secondary | ICD-10-CM

## 2011-12-16 MED ORDER — LEVOCETIRIZINE DIHYDROCHLORIDE 5 MG PO TABS: 5.0000 mg | ORAL_TABLET | Freq: Every evening | ORAL | Status: DC

## 2011-12-16 NOTE — Telephone Encounter (Signed)
Xyzal rx called to [facility] pharmacy.

## 2011-12-28 ENCOUNTER — Other Ambulatory Visit: Payer: Self-pay | Admitting: Internal Medicine

## 2012-01-03 ENCOUNTER — Encounter: Payer: Self-pay | Admitting: Internal Medicine

## 2012-01-03 ENCOUNTER — Ambulatory Visit (INDEPENDENT_AMBULATORY_CARE_PROVIDER_SITE_OTHER): Payer: 59 | Admitting: Internal Medicine

## 2012-01-03 VITALS — BP 118/73 | HR 85 | Temp 98.7°F | Ht 72.0 in | Wt 216.6 lb

## 2012-01-03 DIAGNOSIS — G909 Disorder of the autonomic nervous system, unspecified: Secondary | ICD-10-CM

## 2012-01-03 DIAGNOSIS — Z79899 Other long term (current) drug therapy: Secondary | ICD-10-CM

## 2012-01-03 DIAGNOSIS — M25511 Pain in right shoulder: Secondary | ICD-10-CM | POA: Insufficient documentation

## 2012-01-03 DIAGNOSIS — E782 Mixed hyperlipidemia: Secondary | ICD-10-CM

## 2012-01-03 DIAGNOSIS — E1149 Type 2 diabetes mellitus with other diabetic neurological complication: Secondary | ICD-10-CM

## 2012-01-03 DIAGNOSIS — E119 Type 2 diabetes mellitus without complications: Secondary | ICD-10-CM

## 2012-01-03 DIAGNOSIS — N529 Male erectile dysfunction, unspecified: Secondary | ICD-10-CM

## 2012-01-03 DIAGNOSIS — I10 Essential (primary) hypertension: Secondary | ICD-10-CM

## 2012-01-03 DIAGNOSIS — M25519 Pain in unspecified shoulder: Secondary | ICD-10-CM

## 2012-01-03 DIAGNOSIS — E785 Hyperlipidemia, unspecified: Secondary | ICD-10-CM

## 2012-01-03 LAB — LIPID PANEL
Cholesterol: 151 mg/dL (ref 0–200)
HDL: 36 mg/dL — ABNORMAL LOW (ref >39)
Total CHOL/HDL Ratio: 4.2 Ratio
Triglycerides: 164 mg/dL — ABNORMAL HIGH (ref <150)
VLDL: 33 mg/dL (ref 0–40)

## 2012-01-03 LAB — POCT GLYCOSYLATED HEMOGLOBIN (HGB A1C): Hemoglobin A1C: 9.4

## 2012-01-03 MED ORDER — NIACIN ER (ANTIHYPERLIPIDEMIC) 500 MG PO TBCR: 1000.0000 mg | EXTENDED_RELEASE_TABLET | Freq: Every day | ORAL | Status: DC

## 2012-01-03 MED ORDER — VARDENAFIL HCL 10 MG PO TABS: 10.0000 mg | ORAL_TABLET | Freq: Every day | ORAL | Status: DC | PRN

## 2012-01-03 MED ORDER — REPAGLINIDE 0.5 MG PO TABS: ORAL_TABLET | ORAL | Status: DC

## 2012-01-03 MED ORDER — HYDROCODONE-ACETAMINOPHEN 5-500 MG PO TABS: 1.0000 | ORAL_TABLET | Freq: Four times a day (QID) | ORAL | Status: DC | PRN

## 2012-01-03 NOTE — Progress Notes (Signed)
Subjective:   Patient ID: Tyler Wilkinson male   DOB: [birth year] 56 y.o.   MRN: [id number]  HPI: Tyler Wilkinson is a 56 y.o. man who presents to clinic today for follow up of his chronic medical conditions including diabetes, hypertension, and hyperlipidemia.  He also is complaining today of right shoulder pain that has been bothering him more for the last several weeks.    He states that he has been out of his prandin for "sometime."  He had been taking his Prandin while he was at work because he did not want to carry his insulin with him to work for fear of losing it or having to have it out of the refrigerator.  He states that he has continued to take his Lantus but that he has missed days "here and there."  He has also missed the last few appointments and has not had an A1C since October 2012.    He states that his right shoulder has given him problems for several weeks.  He has been having pain in the front portion of the shoulder and he states that it hurts more when he is moving the shoulder or lifting things above his head.  He denies any injury to the area or dislocation.    He has been able to get and take his other medications for his blood pressure and cholesterol.  He denies side effects of dizziness on standing, muscle aches, headache, nausea, vomiting, or chest pain.   Past Medical History  Diagnosis Date  . DM (diabetes mellitus)   . Hypertension   . Erectile dysfunction   . Dyslipidemia   . CAD (coronary artery disease)     stent RCA 2006 (other residual disease). /  nuclear 2008  no ischemia, inferolateral scar.  . Ejection fraction     EF 55%,echo, 2008, inferolateral hypokinesis,   Current Outpatient Prescriptions  Medication Sig Dispense Refill  . albuterol (PROVENTIL,VENTOLIN) 90 MCG/ACT inhaler Inhale 2 puffs into the lungs every 6 (six) hours as needed for wheezing.  17 g  12  . benazepril (LOTENSIN) 40 MG tablet Take 1 tablet (40 mg total) by mouth daily.  30  tablet  11  . Blood Glucose Monitoring Suppl (ONE TOUCH ULTRA 2) W/DEVICE KIT Use to check blood sugar sup to 6 times daily Dx code 250.00  1 each  0  . carvedilol (COREG) 6.25 MG tablet Take 1 tablet (6.25 mg total) by mouth 2 (two) times daily.  120 tablet  3  . Flaxseed, Linseed, (FLAX SEED OIL PO) Take by mouth daily.        Marland Kitchen glucose blood (ONE TOUCH ULTRA TEST) test strip Use to check blood sugar up to 6 times daily. 250.00  200 each  12  . HUMULIN R 100 UNIT/ML injection INJECT 30 UNITS SUBCUTANEOUSLY WITH EACH MEAL  10 mL  4  . hydrochlorothiazide (HYDRODIURIL) 25 MG tablet Take 1 tablet (25 mg total) by mouth daily.  60 tablet  5  . HYDROcodone-acetaminophen (VICODIN) 5-500 MG per tablet Take 1-2 tablets by mouth every 6 (six) hours as needed for pain.  30 tablet  3  . insulin glargine (LANTUS) 100 UNIT/ML injection Inject 60 Units into the skin at bedtime.  3 vial  5  . Insulin Syringe-Needle U-100 (INSULIN SYRINGE .5CC/31GX5/16") 31G X 5/16" 0.5 ML MISC Use to inject insulin 3 times a day Dx code 250.00  100 each  12  . latanoprost (XALATAN) 0.005 % ophthalmic  solution Place 1 drop into both eyes at bedtime.      Marland Kitchen levocetirizine (XYZAL) 5 MG tablet Take 1 tablet (5 mg total) by mouth every evening.  30 tablet  11  . montelukast (SINGULAIR) 10 MG tablet Take 1 tablet (10 mg total) by mouth daily.  30 tablet  2  . niacin (NIASPAN) 1000 MG CR tablet Take 1 tablet (1,000 mg total) by mouth at bedtime.  30 tablet  3  . ONETOUCH DELICA LANCETS MISC Use to check blood sugar sup to 6 times daily. Dx code 250.00  200 each  12  . pravastatin (PRAVACHOL) 40 MG tablet Take 1 tablet (40 mg total) by mouth daily.  30 tablet  11  . repaglinide (PRANDIN) 0.5 MG tablet Take up to 12 tablets a day, take with lunch and at bedtime as instructed.  360 tablet  3  . sildenafil (VIAGRA) 100 MG tablet Take 50 mg by mouth daily as needed.         No family history on file. History   Social History  .  Marital Status: Single    Spouse Name: N/A    Number of Children: N/A  . 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 Smoking status: Former Games developer  . Smokeless tobacco: None  . Alcohol Use: None  . Drug Use: None  . Sexually Active: None   Other Topics Concern  . None   Social History Narrative  . None   Review of Systems: Negative except as noted in the HPI  Objective:  Physical Exam: Filed Vitals:   01/03/12 1610  BP: 118/73  Pulse: 85  Temp: 98.7 F (37.1 C)  TempSrc: Oral  Height: 6' (1.829 m)  Weight: 216 lb 9.6 oz (98.249 kg)   Constitutional: Vital signs reviewed.  Patient is a well-developed and well-nourished man in no acute distress and cooperative with exam. Alert and oriented x3.  Head: Normocephalic and atraumatic Ear: TM normal bilaterally Mouth: no erythema or exudates, MMM, poor dentition.  Eyes: PERRL, EOMI, conjunctivae normal, No scleral icterus.  Neck: Supple, Trachea midline normal ROM, No JVD, mass, thyromegaly, or carotid bruit present.  Cardiovascular: RRR, S1 normal, S2 normal, no MRG, pulses symmetric and intact bilaterally Pulmonary/Chest: CTAB, no wheezes, rales, or rhonchi Abdominal: Soft. Non-tender, non-distended, bowel sounds are normal, no masses, organomegaly, or guarding present.  GU: no CVA tenderness Musculoskeletal: Left shoulder with full ROM without pain.  Right shoulder is limited in internal and external rotation as well as abduction.  Strength is equal and symmetric, no deformity noted.  There is pain to palpation over the biceps tendon.  Empty can test is positive on the right.   Hematology: no cervical, inginal, or axillary adenopathy.  Neurological: A&O x3, Strength is normal and symmetric bilaterally, cranial nerve II-XII are grossly intact, no focal motor deficit, sensory intact to light touch bilaterally.  Skin: Warm, dry and intact. No  rash, cyanosis, or clubbing.  Psychiatric: Normal mood and affect. speech and behavior is normal. Judgment and thought content normal. Cognition and memory are normal.   Assessment & Plan:

## 2012-01-03 NOTE — Patient Instructions (Addendum)
1.  Restart your Prandin.    2.  Continue all of your other medications as prescribed.  3.  It is okay to use ice and heat on your shoulder.  It is okay to use the tylenol for the pain but be careful with the pain medication because it has tylenol in it.  4.  Encourage your friend to be tested for a Viral Load.  Shoulder Exercises EXERCISES  RANGE OF MOTION (ROM) AND STRETCHING EXERCISES These exercises may help you when beginning to rehabilitate your injury. Your symptoms may resolve with or without further involvement from your physician, physical therapist or athletic trainer. While completing these exercises, remember:   Restoring tissue flexibility helps normal motion to return to the joints. This allows healthier, less painful movement and activity.   An effective stretch should be held for at least 30 seconds.   A stretch should never be painful. You should only feel a gentle lengthening or release in the stretched tissue.  ROM - Pendulum  Bend at the waist so that your right / left arm falls away from your body. Support yourself with your opposite hand on a solid surface, such as a table or a countertop.   Your right / left arm should be perpendicular to the ground. If it is not perpendicular, you need to lean over farther. Relax the muscles in your right / left arm and shoulder as much as possible.   Gently sway your hips and trunk so they move your right / left arm without any use of your right / left shoulder muscles.   Progress your movements so that your right / left arm moves side to side, then forward and backward, and finally, both clockwise and counterclockwise.   Complete 10 repetitions in each direction. Many people use this exercise to relieve discomfort in their shoulder as well as to gain range of motion.  Repeat 3 times. Complete this exercise 3 times per day. STRETCH - Flexion, Standing  Stand with good posture. With an underhand grip on your right / left hand  and an overhand grip on the opposite hand, grasp a broomstick or cane so that your hands are a little more than shoulder-width apart.   Keeping your right / left elbow straight and shoulder muscles relaxed, push the stick with your opposite hand to raise your right / left arm in front of your body and then overhead. Raise your arm until you feel a stretch in your right / left shoulder, but before you have increased shoulder pain.   Try to avoid shrugging your right / left shoulder as your arm rises by keeping your shoulder blade tucked down and toward your mid-back spine. Hold 10-15 seconds.   Slowly return to the starting position.  Repeat 10 times. Complete this exercise 3 times per day. STRETCH - Internal Rotation  Place your right / left hand behind your back, palm-up.   Throw a towel or belt over your opposite shoulder. Grasp the towel/belt with your right / left hand.   While keeping an upright posture, gently pull up on the towel/belt until you feel a stretch in the front of your right / left shoulder.   Avoid shrugging your right / left shoulder as your arm rises by keeping your shoulder blade tucked down and toward your mid-back spine.   Hold 10-15 seconds. Release the stretch by lowering your opposite hand.  Repeat 10 times. Complete this exercise 3 times per day. STRETCH - External Rotation  and Abduction  Stagger your stance through a doorframe. It does not matter which foot is forward.   As instructed by your physician, physical therapist or athletic trainer, place your hands:   And forearms above your head and on the door frame.   And forearms at head-height and on the door frame.   At elbow-height and on the door frame.   Keeping your head and chest upright and your stomach muscles tight to prevent over-extending your low-back, slowly shift your weight onto your front foot until you feel a stretch across your chest and/or in the front of your shoulders.   Hold 10-15  seconds. Shift your weight to your back foot to release the stretch.  Repeat 10 times. Complete this stretch 3 times per day.  STRENGTHENING EXERCISES  These exercises may help you when beginning to rehabilitate your injury. They may resolve your symptoms with or without further involvement from your physician, physical therapist or athletic trainer. While completing these exercises, remember:   Muscles can gain both the endurance and the strength needed for everyday activities through controlled exercises.   Complete these exercises as instructed by your physician, physical therapist or athletic trainer. Progress the resistance and repetitions only as guided.   You may experience muscle soreness or fatigue, but the pain or discomfort you are trying to eliminate should never worsen during these exercises. If this pain does worsen, stop and make certain you are following the directions exactly. If the pain is still present after adjustments, discontinue the exercise until you can discuss the trouble with your clinician.   If advised by your physician, during your recovery, avoid activity or exercises which involve actions that place your right / left hand or elbow above your head or behind your back or head. These positions stress the tissues which are trying to heal.  STRENGTH - Scapular Depression and Adduction  With good posture, sit on a firm chair. Supported your arms in front of you with pillows, arm rests or a table top. Have your elbows in line with the sides of your body.   Gently draw your shoulder blades down and toward your mid-back spine. Gradually increase the tension without tensing the muscles along the top of your shoulders and the back of your neck.   Hold for 10-15 seconds. Slowly release the tension and relax your muscles completely before completing the next repetition.   After you have practiced this exercise, remove the arm support and complete it in standing as well as  sitting.  Repeat 10 times. Complete this exercise 3 times per day.  STRENGTH - External Rotators  Secure a rubber exercise band/tubing to a fixed object so that it is at the same height as your right / left elbow when you are standing or sitting on a firm surface.   Stand or sit so that the secured exercise band/tubing is at your side that is not injured.   Bend your elbow 90 degrees. Place a folded towel or small pillow under your right / left arm so that your elbow is a few inches away from your side.   Keeping the tension on the exercise band/tubing, pull it away from your body, as if pivoting on your elbow. Be sure to keep your body steady so that the movement is only coming from your shoulder rotating.   Hold 10-15 seconds. Release the tension in a controlled manner as you return to the starting position.  Repeat 10 times. Complete this exercise  3 times per day.  STRENGTH - Supraspinatus  Stand or sit with good posture. Grasp a 3 lb weight or an exercise band/tubing so that your hand is "thumbs-up," like when you shake hands.   Slowly lift your right / left hand from your thigh into the air, traveling about 30 degrees from straight out at your side. Lift your hand to shoulder height or as far as you can without increasing any shoulder pain. Initially, many people do not lift their hands above shoulder height.   Avoid shrugging your right / left shoulder as your arm rises by keeping your shoulder blade tucked down and toward your mid-back spine.   Hold for 10-15 seconds. Control the descent of your hand as you slowly return to your starting position.  Repeat 10 times. Complete this exercise 3 times per day.  STRENGTH - Shoulder Extensors  Secure a rubber exercise band/tubing so that it is at the height of your shoulders when you are either standing or sitting on a firm arm-less chair.   With a thumbs-up grip, grasp an end of the band/tubing in each hand. Straighten your elbows and  lift your hands straight in front of you at shoulder height. Step back away from the secured end of band/tubing until it becomes tense.   Squeezing your shoulder blades together, pull your hands down to the sides of your thighs. Do not allow your hands to go behind you.   Hold for 10-15 seconds. Slowly ease the tension on the band/tubing as you reverse the directions and return to the starting position.  Repeat 10 times. Complete this exercise 3 times per day.  STRENGTH - Scapular Retractors  Secure a rubber exercise band/tubing so that it is at the height of your shoulders when you are either standing or sitting on a firm arm-less chair.   With a palm-down grip, grasp an end of the band/tubing in each hand. Straighten your elbows and lift your hands straight in front of you at shoulder height. Step back away from the secured end of band/tubing until it becomes tense.   Squeezing your shoulder blades together, draw your elbows back as you bend them. Keep your upper arm lifted away from your body throughout the exercise.   Hold 10-15 seconds. Slowly ease the tension on the band/tubing as you reverse the directions and return to the starting position.  Repeat 10 times. Complete this exercise 3 times per day. STRENGTH - Scapular Depressors  Find a sturdy chair without wheels, such as a from a dining room table.   Keeping your feet on the floor, lift your bottom from the seat and lock your elbows.   Keeping your elbows straight, allow gravity to pull your body weight down. Your shoulders will rise toward your ears.   Raise your body against gravity by drawing your shoulder blades down your back, shortening the distance between your shoulders and ears. Although your feet should always maintain contact with the floor, your feet should progressively support less body weight as you get stronger.   Hold 10-15 seconds. In a controlled and slow manner, lower your body weight to begin the next  repetition.  Repeat 10 times. Complete this exercise 3 times per day.  Document Released: 06/19/2005 Document Revised: 07/25/2011 Document Reviewed: 11/17/2008 [facility] Patient Information 2012 [place], [place].

## 2012-01-10 ENCOUNTER — Other Ambulatory Visit: Payer: 59

## 2012-01-15 ENCOUNTER — Encounter: Payer: Self-pay | Admitting: Cardiology

## 2012-01-16 ENCOUNTER — Ambulatory Visit (INDEPENDENT_AMBULATORY_CARE_PROVIDER_SITE_OTHER): Payer: 59 | Admitting: Cardiology

## 2012-01-16 ENCOUNTER — Encounter: Payer: Self-pay | Admitting: Cardiology

## 2012-01-16 VITALS — BP 122/70 | HR 76 | Ht 72.0 in | Wt 216.0 lb

## 2012-01-16 DIAGNOSIS — I2510 Atherosclerotic heart disease of native coronary artery without angina pectoris: Secondary | ICD-10-CM

## 2012-01-16 DIAGNOSIS — E785 Hyperlipidemia, unspecified: Secondary | ICD-10-CM

## 2012-01-16 DIAGNOSIS — I10 Essential (primary) hypertension: Secondary | ICD-10-CM

## 2012-01-16 NOTE — Assessment & Plan Note (Signed)
Blood pressure is controlled. No change in therapy. 

## 2012-01-16 NOTE — Patient Instructions (Signed)
Your physician wants you to follow-up in: 9 months You will receive a reminder letter in the mail two months in advance. If you don't receive a letter, please call our office to schedule the follow-up appointment.  

## 2012-01-16 NOTE — Progress Notes (Signed)
HPI Patient is seen for cardiology followup. He is quite active. He works all the time. He's had significant stress over time. He's not having any of his significant chest pain or back pain.  No Known Allergies  Current Outpatient Prescriptions  Medication Sig Dispense Refill  . albuterol (PROVENTIL,VENTOLIN) 90 MCG/ACT inhaler Inhale 2 puffs into the lungs every 6 (six) hours as needed for wheezing.  17 g  12  . benazepril (LOTENSIN) 40 MG tablet Take 1 tablet (40 mg total) by mouth daily.  30 tablet  11  . Blood Glucose Monitoring Suppl (ONE TOUCH ULTRA 2) W/DEVICE KIT Use to check blood sugar sup to 6 times daily Dx code 250.00  1 each  0  . carvedilol (COREG) 6.25 MG tablet Take 1 tablet (6.25 mg total) by mouth 2 (two) times daily.  120 tablet  3  . Flaxseed, Linseed, (FLAX SEED OIL PO) Take by mouth daily.        Marland Kitchen glucose blood (ONE TOUCH ULTRA TEST) test strip Use to check blood sugar up to 6 times daily. 250.00  200 each  12  . HUMULIN R 100 UNIT/ML injection INJECT 30 UNITS SUBCUTANEOUSLY WITH EACH MEAL  10 mL  4  . hydrochlorothiazide (HYDRODIURIL) 25 MG tablet Take 1 tablet (25 mg total) by mouth daily.  60 tablet  5  . HYDROcodone-acetaminophen (VICODIN) 5-500 MG per tablet Take 1-2 tablets by mouth every 6 (six) hours as needed for pain.  30 tablet  3  . insulin glargine (LANTUS) 100 UNIT/ML injection Inject 60 Units into the skin at bedtime.  3 vial  5  . Insulin Syringe-Needle U-100 (INSULIN SYRINGE .5CC/31GX5/16") 31G X 5/16" 0.5 ML MISC Use to inject insulin 3 times a day Dx code 250.00  100 each  12  . latanoprost (XALATAN) 0.005 % ophthalmic solution Place 1 drop into both eyes at bedtime.      Marland Kitchen levocetirizine (XYZAL) 5 MG tablet Take 1 tablet (5 mg total) by mouth every evening.  30 tablet  11  . ONETOUCH DELICA LANCETS MISC Use to check blood sugar sup to 6 times daily. Dx code 250.00  200 each  12  . pravastatin (PRAVACHOL) 40 MG tablet Take 1 tablet (40 mg total)  by mouth daily.  30 tablet  11  . vardenafil (LEVITRA) 10 MG tablet Take 1 tablet (10 mg total) by mouth daily as needed for erectile dysfunction.  10 tablet  0    History   Social History  . Marital Status: Single    Spouse Name: N/A    Number of Children: N/A  . 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 Smoking status: Former Games developer  . Smokeless tobacco: Not on file  . Alcohol Use: Not on file  . Drug Use: Not on file  . Sexually Active: Not on file   Other Topics Concern  . Not on file   Social History Narrative  . No narrative on file    No family history on file.  Past Medical History  Diagnosis Date  . DM (diabetes mellitus)   . Hypertension   . Erectile dysfunction   . Dyslipidemia   . CAD (coronary artery disease)     stent RCA 2006 (other residual disease). /  nuclear 2008  no ischemia, inferolateral scar.  . Ejection fraction     EF 55%,echo, 2008,  inferolateral hypokinesis,    Past Surgical History  Procedure Date  . Coronary stent placement     ROS Patient denies fever, chills, headache, sweats, rash, change in vision, change in hearing, chest pain, cough, nausea vomiting, urinary symptoms. He does have dental disease.  PHYSICAL EXAM Patient is oriented to person time and place. Affect is normal. Head is atraumatic there is no jugulovenous distention. Lungs are clear. Respiratory effort is nonlabored. Cardiac exam reveals S1 and S2. There no clicks or significant murmurs. The abdomen is soft. There is no peripheral edema. He does have dental disease.  Filed Vitals:   01/16/12 1640  BP: 122/70  Pulse: 76  Height: 6' (1.829 m)  Weight: 216 lb (97.977 kg)   EKG is done today and reviewed by me. There is no significant abnormality.  ASSESSMENT & PLAN

## 2012-01-16 NOTE — Assessment & Plan Note (Signed)
Patient is on Pravachol. His LDL is 82. No change in therapy

## 2012-01-16 NOTE — Assessment & Plan Note (Signed)
Coronary disease is stable.  No further workup. 

## 2012-03-20 ENCOUNTER — Encounter: Payer: Self-pay | Admitting: Internal Medicine

## 2012-03-20 ENCOUNTER — Ambulatory Visit (INDEPENDENT_AMBULATORY_CARE_PROVIDER_SITE_OTHER): Payer: 59 | Admitting: Internal Medicine

## 2012-03-20 VITALS — BP 122/72 | HR 84 | Temp 97.6°F | Ht 73.0 in | Wt 220.8 lb

## 2012-03-20 DIAGNOSIS — E1149 Type 2 diabetes mellitus with other diabetic neurological complication: Secondary | ICD-10-CM

## 2012-03-20 DIAGNOSIS — G909 Disorder of the autonomic nervous system, unspecified: Secondary | ICD-10-CM

## 2012-03-20 DIAGNOSIS — K3184 Gastroparesis: Secondary | ICD-10-CM

## 2012-03-20 DIAGNOSIS — K029 Dental caries, unspecified: Secondary | ICD-10-CM

## 2012-03-20 DIAGNOSIS — E1143 Type 2 diabetes mellitus with diabetic autonomic (poly)neuropathy: Secondary | ICD-10-CM

## 2012-03-20 DIAGNOSIS — Z79899 Other long term (current) drug therapy: Secondary | ICD-10-CM

## 2012-03-20 DIAGNOSIS — E119 Type 2 diabetes mellitus without complications: Secondary | ICD-10-CM

## 2012-03-20 DIAGNOSIS — I10 Essential (primary) hypertension: Secondary | ICD-10-CM

## 2012-03-20 LAB — POCT GLYCOSYLATED HEMOGLOBIN (HGB A1C): Hemoglobin A1C: 9.5

## 2012-03-20 MED ORDER — METOCLOPRAMIDE HCL 5 MG PO TABS: 5.0000 mg | ORAL_TABLET | Freq: Three times a day (TID) | ORAL | Status: DC

## 2012-03-20 MED ORDER — REPAGLINIDE 1 MG PO TABS: ORAL_TABLET | ORAL | Status: DC

## 2012-03-20 MED ORDER — ASPIRIN EC 81 MG PO TBEC: 81.0000 mg | DELAYED_RELEASE_TABLET | Freq: Every day | ORAL | Status: DC

## 2012-03-20 NOTE — Progress Notes (Signed)
Subjective:   Patient ID: Tyler Wilkinson male   DOB: [birth year] 56 y.o.   MRN: [id number]  HPI: Tyler Wilkinson is a 56 y.o. man who presents to clinic today for follow up from his last appointment.  He states that he has trying to take his insulin more often to get better control of his sugars but admits there are days he is missing his insulin.  He also has not picked up his Prandin to take when he is at work.  He is asking for a refill of his Prandin today.    He states that he saw an oral surgeon recently and was told that he had to have 11 teeth pulled.  He states that he is trying to get them down by the end of the month but that he had to come up with the money to be able to do it.    He states that he has had problems with his appetite lately.  He states that when he tries to eat he has some mild nausea and feels full quickly after eating.  He also has noticed a cramping abdominal pain in the center of his abdomen when he eats.   He denies vomiting, fever, chills, diarrhea, constipation, or weight loss.  He states that he had "some test that saw that I hold onto my food too long" a "while back."  He was never given medication for this but has not had any problems except for the last few months.    Past Medical History  Diagnosis Date  . DM (diabetes mellitus)   . Hypertension   . Erectile dysfunction   . Dyslipidemia   . CAD (coronary artery disease)     stent RCA 2006 (other residual disease). /  nuclear 2008  no ischemia, inferolateral scar.  . Ejection fraction     EF 55%,echo, 2008, inferolateral hypokinesis,   Current Outpatient Prescriptions  Medication Sig Dispense Refill  . albuterol (PROVENTIL,VENTOLIN) 90 MCG/ACT inhaler Inhale 2 puffs into the lungs every 6 (six) hours as needed for wheezing.  17 g  12  . benazepril (LOTENSIN) 40 MG tablet Take 1 tablet (40 mg total) by mouth daily.  30 tablet  11  . Blood Glucose Monitoring Suppl (ONE TOUCH ULTRA 2) W/DEVICE KIT Use to  check blood sugar sup to 6 times daily Dx code 250.00  1 each  0  . carvedilol (COREG) 6.25 MG tablet Take 1 tablet (6.25 mg total) by mouth 2 (two) times daily.  120 tablet  3  . Flaxseed, Linseed, (FLAX SEED OIL PO) Take by mouth daily.        Marland Kitchen glucose blood (ONE TOUCH ULTRA TEST) test strip Use to check blood sugar up to 6 times daily. 250.00  200 each  12  . HUMULIN R 100 UNIT/ML injection INJECT 30 UNITS SUBCUTANEOUSLY WITH EACH MEAL  10 mL  4  . hydrochlorothiazide (HYDRODIURIL) 25 MG tablet Take 1 tablet (25 mg total) by mouth daily.  60 tablet  5  . HYDROcodone-acetaminophen (VICODIN) 5-500 MG per tablet Take 1-2 tablets by mouth every 6 (six) hours as needed for pain.  30 tablet  3  . insulin glargine (LANTUS) 100 UNIT/ML injection Inject 60 Units into the skin at bedtime.  3 vial  5  . Insulin Syringe-Needle U-100 (INSULIN SYRINGE .5CC/31GX5/16") 31G X 5/16" 0.5 ML MISC Use to inject insulin 3 times a day Dx code 250.00  100 each  12  .  latanoprost (XALATAN) 0.005 % ophthalmic solution Place 1 drop into both eyes at bedtime.      Marland Kitchen levocetirizine (XYZAL) 5 MG tablet Take 1 tablet (5 mg total) by mouth every evening.  30 tablet  11  . ONETOUCH DELICA LANCETS MISC Use to check blood sugar sup to 6 times daily. Dx code 250.00  200 each  12  . pravastatin (PRAVACHOL) 40 MG tablet Take 1 tablet (40 mg total) by mouth daily.  30 tablet  11  . vardenafil (LEVITRA) 10 MG tablet Take 1 tablet (10 mg total) by mouth daily as needed for erectile dysfunction.  10 tablet  0   No family history on file. History   Social History  . Marital Status: Single    Spouse Name: N/A    Number of Children: N/A  . 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 Smoking status: Former Games developer  . Smokeless tobacco: None  . Alcohol Use: None  . Drug Use: None  . Sexually Active: None   Other Topics Concern  . None    Social History Narrative  . None   Review of Systems: Constitutional: Denies fever, chills, diaphoresis, appetite change and fatigue.  HEENT: Positive for broken teeth and tooth pain. Denies photophobia, eye pain, redness, hearing loss, ear pain, congestion, sore throat, rhinorrhea, sneezing, mouth sores, trouble swallowing, neck pain, neck stiffness and tinnitus.   Respiratory: Denies SOB, DOE, cough, chest tightness,  and wheezing.   Cardiovascular: Denies chest pain, palpitations and leg swelling.  Gastrointestinal: Positive for  Nausea, abdominal pain Denies vomiting, diarrhea, constipation, blood in stool and abdominal distention.  Genitourinary: Denies dysuria, urgency, frequency, hematuria, flank pain and difficulty urinating.  Musculoskeletal: Denies myalgias, back pain, joint swelling, arthralgias and gait problem.  Skin: Denies pallor, rash and wound.  Neurological: Denies dizziness, seizures, syncope, weakness, light-headedness, numbness and headaches.  Hematological: Denies adenopathy. Easy bruising, personal or family bleeding history  Psychiatric/Behavioral: Denies suicidal ideation, mood changes, confusion, nervousness, sleep disturbance and agitation  Objective:  Physical Exam: Filed Vitals:   03/20/12 1527  BP: 122/72  Pulse: 84  Temp: 97.6 F (36.4 C)  TempSrc: Oral  Height: 6\' 1"  (1.854 m)  Weight: 220 lb 12.8 oz (100.154 kg)   Constitutional: Vital signs reviewed.  Patient is a well-developed and well-nourished man in no acute distress and cooperative with exam. Alert and oriented x3.  Head: Normocephalic and atraumatic Ear: TM normal bilaterally Mouth: very poor dentition with several missing teeth, remaining teeth with marked dental caries as well as several broken teeth, halitosis. no erythema or exudates, MMM Eyes: PERRL, EOMI, conjunctivae normal, No scleral icterus.  Neck: Supple, Trachea midline normal ROM, No JVD, mass, thyromegaly, or carotid bruit  present.  Cardiovascular: RRR, S1 normal, S2 normal, no MRG, pulses symmetric and intact bilaterally Pulmonary/Chest: CTAB, no wheezes, rales, or rhonchi Abdominal: Soft. Mild periumbilical tenderness. non-distended, bowel sounds are normal, no masses, organomegaly, or guarding present.  GU: no CVA tenderness Musculoskeletal: No joint deformities, erythema, or stiffness, ROM full and no nontender Hematology: no cervical, inginal, or axillary adenopathy.  Neurological: A&O x3, Strength is normal and symmetric bilaterally, cranial nerve II-XII are grossly intact, no focal motor deficit, sensory intact to light touch bilaterally.  Skin: Warm, dry and intact. No rash, cyanosis, or clubbing.  Psychiatric: Normal mood and affect. speech and behavior is normal. Judgment and thought content  normal. Cognition and memory are normal.   Assessment & Plan:

## 2012-03-20 NOTE — Patient Instructions (Addendum)
1.  Start Reglan 5 mg.  Take 1 tablet before each meal and before bedtime.    2.  Continue all your other medications as prescribed.  Please make sure you are taking your insulin as directed.    3.  Keep a band aid on your toe with some antibacterial cream on.    4.  Consider getting your teeth taken care of sooner rather then later  5.  Follow up in 3 month.

## 2012-03-27 ENCOUNTER — Encounter: Payer: 59 | Admitting: Internal Medicine

## 2012-04-21 ENCOUNTER — Other Ambulatory Visit: Payer: Self-pay | Admitting: Internal Medicine

## 2012-04-22 NOTE — Assessment & Plan Note (Signed)
Lab Results  Component Value Date   CHOL 151 01/03/2012   CHOL 105 12/10/2010   CHOL 106 01/05/2010   Lab Results  Component Value Date   HDL 36* 01/03/2012   HDL 36* 12/10/2010   HDL 42 01/05/2010   Lab Results  Component Value Date   LDLCALC 82 01/03/2012   LDLCALC 57 12/10/2010   LDLCALC 50 01/05/2010   Lab Results  Component Value Date   TRIG 164* 01/03/2012   TRIG 62 12/10/2010   TRIG 70 01/05/2010   Lab Results  Component Value Date   CHOLHDL 4.2 01/03/2012   CHOLHDL 2.9 12/10/2010   CHOLHDL 2.5 Ratio 01/05/2010   No results found for this basename: LDLDIRECT   He is due for a recheck of his cholesterol since we had swapped him from Crestor to Pravastatin at his last visit because of cost.    LDL was still well within goal.  We will continue his pravastatin at his current dose.

## 2012-04-22 NOTE — Assessment & Plan Note (Signed)
BP Readings from Last 5 Encounters:  01/03/12 118/73  06/24/11 136/62  06/21/11 140/75   His blood pressure is well controlled today. We will have him continue on his current medication and continue to follow

## 2012-04-22 NOTE — Assessment & Plan Note (Signed)
He has a history of erectile dysfunction likely secondary to his diabetes and small vessel disease.  He wants a refill of his Levitra today.

## 2012-04-22 NOTE — Assessment & Plan Note (Signed)
His shoulder pain is likely secondary to a rotator cuff strain or partial tear.  He is able to move the arm but has limited ROM.  We will start with conservative treatment, using NSAID, ice, and stretching and strengthening exercises.  If he fails home treatment we will consider PT for rehabilitation and possible referral to sports medicine for ultrasound and possible injection therapy.

## 2012-04-22 NOTE — Assessment & Plan Note (Signed)
Lab Results  Component Value Date   HGBA1C 9.4 01/03/2012   HGBA1C 8.1* 06/12/2011   Lab Results  Component Value Date   MICROALBUR 0.60 01/05/2010   LDLCALC 82 01/03/2012   CREATININE 0.92 06/12/2011   His A1C has risen from his last check likely secondary to medication non-adherence.  We discussed getting back on his Prandin vs having him take his insulin to work with him.  We decided to restart the prandin.  He states that it is expensive but likes it better then the risk of losing his insulin.

## 2012-05-05 DIAGNOSIS — E1143 Type 2 diabetes mellitus with diabetic autonomic (poly)neuropathy: Secondary | ICD-10-CM | POA: Insufficient documentation

## 2012-05-05 DIAGNOSIS — K3184 Gastroparesis: Secondary | ICD-10-CM | POA: Insufficient documentation

## 2012-05-05 DIAGNOSIS — K029 Dental caries, unspecified: Secondary | ICD-10-CM | POA: Insufficient documentation

## 2012-05-06 NOTE — Assessment & Plan Note (Signed)
Lab Results  Component Value Date   NA 135 06/12/2011   K 3.5 06/12/2011   CL 103 06/12/2011   CO2 25 06/12/2011   BUN 18 06/12/2011   CREATININE 0.92 06/12/2011   CREATININE 0.99 12/10/2010    BP Readings from Last 3 Encounters:  03/20/12 122/72  01/16/12 122/70  01/03/12 118/73    Assessment: Hypertension control:  controlled  Progress toward goals:  at goal Barriers to meeting goals:  no barriers identified  Plan: Hypertension treatment:  continue current medications

## 2012-05-06 NOTE — Assessment & Plan Note (Signed)
He has a gastric emptying study in the chart from 2004 which showed very mild gastroparesis.  With his long history since then of poorly controlled diabetes it is very likely that this has worsened since then.  He is having symptoms of early satiety, nausea, and mild constipation.  We will give him a trial of reglan.  If this works we may want to quantify his gastroparesis with a repeat study off reglan.

## 2012-05-06 NOTE — Assessment & Plan Note (Signed)
Lab Results  Component Value Date   HGBA1C 9.5 03/20/2012   HGBA1C 9.4 01/03/2012   HGBA1C 8.1* 06/12/2011   Lab Results  Component Value Date   MICROALBUR 0.60 01/05/2010   LDLCALC 82 01/03/2012   CREATININE 0.92 06/12/2011   His A1C is essentially unchanged.  He has not been taking his prandin and still refuses to take his insulin to work with him for fear of losing or breaking the vials again.  We will refill his prandin today and get back on that while he is at work.  I encouraged him to continue taking his medications as prescribed.

## 2012-05-06 NOTE — Assessment & Plan Note (Signed)
He needs an oral surgeon and they have recommended removal of all his teeth with denture fitting.  He is hesitant to do this because of the cost.  We discussed today the infection risk with dentition as poor as his and that it would go along way toward helping his diet and taste as well as decreasing his pain.

## 2012-05-22 ENCOUNTER — Telehealth: Payer: Self-pay | Admitting: Dietician

## 2012-05-27 NOTE — Telephone Encounter (Signed)
Patient scheduled for 05/29/12 with Dr. Bosie Clos. Flu and pneumonia vaccines due and foot exam. CDE happy to assist as needed

## 2012-05-29 ENCOUNTER — Ambulatory Visit: Payer: 59 | Admitting: Internal Medicine

## 2012-06-30 ENCOUNTER — Other Ambulatory Visit: Payer: Self-pay | Admitting: *Deleted

## 2012-06-30 DIAGNOSIS — E782 Mixed hyperlipidemia: Secondary | ICD-10-CM

## 2012-06-30 DIAGNOSIS — I10 Essential (primary) hypertension: Secondary | ICD-10-CM

## 2012-07-01 MED ORDER — PRAVASTATIN SODIUM 40 MG PO TABS: 40.0000 mg | ORAL_TABLET | Freq: Every day | ORAL | Status: DC

## 2012-07-01 MED ORDER — BENAZEPRIL HCL 40 MG PO TABS: 40.0000 mg | ORAL_TABLET | Freq: Every day | ORAL | Status: DC

## 2012-07-10 ENCOUNTER — Encounter: Payer: Self-pay | Admitting: Internal Medicine

## 2012-07-10 ENCOUNTER — Ambulatory Visit (INDEPENDENT_AMBULATORY_CARE_PROVIDER_SITE_OTHER): Payer: 59 | Admitting: Internal Medicine

## 2012-07-10 VITALS — BP 111/70 | HR 75 | Temp 96.8°F | Ht 73.0 in | Wt 223.0 lb

## 2012-07-10 DIAGNOSIS — S93409A Sprain of unspecified ligament of unspecified ankle, initial encounter: Secondary | ICD-10-CM

## 2012-07-10 DIAGNOSIS — S93401A Sprain of unspecified ligament of right ankle, initial encounter: Secondary | ICD-10-CM

## 2012-07-10 DIAGNOSIS — E1149 Type 2 diabetes mellitus with other diabetic neurological complication: Secondary | ICD-10-CM

## 2012-07-10 DIAGNOSIS — G909 Disorder of the autonomic nervous system, unspecified: Secondary | ICD-10-CM

## 2012-07-10 DIAGNOSIS — E1143 Type 2 diabetes mellitus with diabetic autonomic (poly)neuropathy: Secondary | ICD-10-CM

## 2012-07-10 DIAGNOSIS — I10 Essential (primary) hypertension: Secondary | ICD-10-CM

## 2012-07-10 HISTORY — DX: Sprain of unspecified ligament of right ankle, initial encounter: S93.401A

## 2012-07-10 LAB — GLUCOSE, CAPILLARY: Glucose-Capillary: 89 mg/dL (ref 70–99)

## 2012-07-10 LAB — POCT GLYCOSYLATED HEMOGLOBIN (HGB A1C): Hemoglobin A1C: 9.4

## 2012-07-10 MED ORDER — NAPROXEN SODIUM 220 MG PO TABS: 440.0000 mg | ORAL_TABLET | Freq: Two times a day (BID) | ORAL | Status: DC

## 2012-07-10 MED ORDER — INSULIN REGULAR HUMAN 100 UNIT/ML IJ SOLN: INTRAMUSCULAR | Status: DC

## 2012-07-10 MED ORDER — CARVEDILOL 6.25 MG PO TABS: 6.2500 mg | ORAL_TABLET | Freq: Two times a day (BID) | ORAL | Status: DC

## 2012-07-10 MED ORDER — HYDROCHLOROTHIAZIDE 25 MG PO TABS: 25.0000 mg | ORAL_TABLET | Freq: Every day | ORAL | Status: DC

## 2012-07-10 MED ORDER — INSULIN GLARGINE 100 UNIT/ML ~~LOC~~ SOLN: 60.0000 [IU] | Freq: Every day | SUBCUTANEOUS | Status: DC

## 2012-07-10 NOTE — Patient Instructions (Signed)
1.  Make sure you pick up the Prandin as directed every day  2.  Continue your other medications as prescribed.  3.  Start Aleve 220 mg tablets.  Take 2 tablets twice daily with food.    4.  We will work to get you over to sports medicine to get an ankle brace.    5.  Make sure you talk to the Worker's comp at your work to see if they have a preferred provider.  6.  Follow up with me in about 1 month.  Make sure you bring your meter with you.  Ankle Sprain An ankle sprain is an injury to the strong, fibrous tissues (ligaments) that hold the bones of your ankle joint together.  CAUSES An ankle sprain is usually caused by a fall or by twisting your ankle. Ankle sprains most commonly occur when you step on the outer edge of your foot, and your ankle turns inward. People who participate in sports are more prone to these types of injuries.  SYMPTOMS   Pain in your ankle. The pain may be present at rest or only when you are trying to stand or walk.  Swelling.  Bruising. Bruising may develop immediately or within 1 to 2 days after your injury.  Difficulty standing or walking, particularly when turning corners or changing directions. DIAGNOSIS  Your caregiver will ask you details about your injury and perform a physical exam of your ankle to determine if you have an ankle sprain. During the physical exam, your caregiver will press on and apply pressure to specific areas of your foot and ankle. Your caregiver will try to move your ankle in certain ways. An X-ray exam may be done to be sure a bone was not broken or a ligament did not separate from one of the bones in your ankle (avulsion fracture).  TREATMENT  Certain types of braces can help stabilize your ankle. Your caregiver can make a recommendation for this. Your caregiver may recommend the use of medicine for pain. If your sprain is severe, your caregiver may refer you to a surgeon who helps to restore function to parts of your skeletal  system (orthopedist) or a physical therapist. HOME CARE INSTRUCTIONS   Apply ice to your injury for 3 to 4 days or as directed by your caregiver. Applying ice helps to reduce inflammation and pain.  Put ice in a plastic bag.  Place a towel between your skin and the bag.  Leave the ice on for 15 to 20 minutes at a time, every 2 hours while you are awake.  Only take over-the-counter or prescription medicines for pain, discomfort, or fever as directed by your caregiver.  Keep your injured leg elevated, when possible, to lessen swelling.  If your caregiver recommends crutches, use them as instructed. Gradually put weight on the affected ankle. Continue to use crutches or a cane until you can walk without feeling pain in your ankle.  If you have a plaster splint, wear the splint as directed by your caregiver. Do not rest it on anything harder than a pillow for the first 24 hours. Do not put weight on it. Do not get it wet. You may take it off to take a shower or bath.  You may have been given an elastic bandage to wear around your ankle to provide support. If the elastic bandage is too tight (you have numbness or tingling in your foot or your foot becomes cold and blue), adjust the bandage to  make it comfortable.  If you have an air splint, you may blow more air into it or let air out to make it more comfortable. You may take your splint off at night and before taking a shower or bath.  Wiggle your toes in the splint several times per day to decrease swelling. SEEK MEDICAL CARE IF:   You have an increase in bruising, swelling, or pain.  Your toes feel extremely cold or you lose feeling in your foot.  Your pain is not relieved with medicine. SEEK IMMEDIATE MEDICAL CARE IF:  Your toes are numb or blue.  You have severe pain. MAKE SURE YOU:   Understand these instructions.  Will watch your condition.  Will get help right away if you are not doing well or get worse. Document  Released: 08/05/2005 Document Revised: 10/28/2011 Document Reviewed: 08/17/2011 [facility] Patient Information 2013 [place], [place].

## 2012-07-10 NOTE — Progress Notes (Signed)
Subjective:   Patient ID: Tyler Wilkinson male   DOB: Dec 21, 1955 56 y.o.   MRN: [id number]  HPI: Tyler Wilkinson is a 56 y.o. man who presents to clinic today for follow up on his chronic medical conditions including diabetes and hypertension.  See Problem focused Assessment and Plan for full details of his chronic medical conditions.   He states that lst Thursday he was carrying some boxes at work when he kicked a pallet and tripped.  By the end of the day his right ankle was causing him pain.  He states that he was able to walk on it after the fall.  The ankle hurts when he is standing for a long time.  He states that if he is seated it can stiffen up and then hurts more then when he starts walking again but will ease up after walking.    Left hurting too but not as bad as the right.  Pain in the in step near the heel.  NSAIDS and ice.    Past Medical History  Diagnosis Date  . DM (diabetes mellitus)   . Hypertension   . Erectile dysfunction   . Dyslipidemia   . CAD (coronary artery disease)     stent RCA 2006 (other residual disease). /  nuclear 2008  no ischemia, inferolateral scar.  . Ejection fraction     EF 55%,echo, 2008, inferolateral hypokinesis,   Current Outpatient Prescriptions  Medication Sig Dispense Refill  . albuterol (PROVENTIL,VENTOLIN) 90 MCG/ACT inhaler Inhale 2 puffs into the lungs every 6 (six) hours as needed for wheezing.  17 g  12  . aspirin EC 81 MG tablet Take 1 tablet (81 mg total) by mouth daily.  150 tablet  2  . benazepril (LOTENSIN) 40 MG tablet Take 1 tablet (40 mg total) by mouth daily.  90 tablet  4  . Blood Glucose Monitoring Suppl (ONE TOUCH ULTRA 2) W/DEVICE KIT Use to check blood sugar sup to 6 times daily Dx code 250.00  1 each  0  . carvedilol (COREG) 6.25 MG tablet Take 1 tablet (6.25 mg total) by mouth 2 (two) times daily.  120 tablet  3  . Flaxseed, Linseed, (FLAX SEED OIL PO) Take by mouth daily.        Marland Kitchen glucose blood (ONE TOUCH ULTRA  TEST) test strip 1 each by Other route 6 (six) times daily.  200 each  11  . HUMULIN R 100 UNIT/ML injection INJECT 30 UNITS SUBCUTANEOUSLY WITH EACH MEAL  10 mL  4  . hydrochlorothiazide (HYDRODIURIL) 25 MG tablet Take 1 tablet (25 mg total) by mouth daily.  60 tablet  5  . HYDROcodone-acetaminophen (VICODIN) 5-500 MG per tablet Take 1-2 tablets by mouth every 6 (six) hours as needed for pain.  30 tablet  3  . insulin glargine (LANTUS) 100 UNIT/ML injection Inject 60 Units into the skin at bedtime.  3 vial  5  . Insulin Syringe-Needle U-100 (INSULIN SYRINGE .5CC/31GX5/16") 31G X 5/16" 0.5 ML MISC Use to inject insulin 3 times a day Dx code 250.00  100 each  12  . latanoprost (XALATAN) 0.005 % ophthalmic solution Place 1 drop into both eyes at bedtime.      Marland Kitchen levocetirizine (XYZAL) 5 MG tablet Take 1 tablet (5 mg total) by mouth every evening.  30 tablet  11  . metoCLOPramide (REGLAN) 5 MG tablet Take 1 tablet (5 mg total) by mouth 4 (four) times daily - after meals and at  bedtime.  120 tablet  1  . ONETOUCH DELICA LANCETS MISC Use to check blood sugar sup to 6 times daily. Dx code 250.00  200 each  12  . pravastatin (PRAVACHOL) 40 MG tablet Take 1 tablet (40 mg total) by mouth daily.  90 tablet  4  . repaglinide (PRANDIN) 1 MG tablet Take up to 12 tablets a day, take with lunch and at bedtime as instructed.  360 tablet  11  . vardenafil (LEVITRA) 10 MG tablet Take 1 tablet (10 mg total) by mouth daily as needed for erectile dysfunction.  10 tablet  0   No family history on file. History   Social History  . Marital Status: Single    Spouse Name: N/A    Number of Children: N/A  . 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 Smoking status: Former Games developer  . Smokeless tobacco: None  . Alcohol Use: None  . Drug Use: None  . Sexually Active: None   Other Topics Concern  . None   Social History  Narrative  . None   Review of Systems: A full 12 system ROS is negative except as noted in the HPI and A&P.   Objective:  Physical Exam: Filed Vitals:   07/10/12 1535  BP: 111/70  Pulse: 75  Temp: 96.8 F (36 C)  TempSrc: Oral  Height: 6\' 1"  (1.854 m)  Weight: 223 lb (101.152 kg)  SpO2: 97%   Constitutional: Vital signs reviewed.  Patient is a well-developed and well-nourished man in no acute distress and cooperative with exam. Alert and oriented x3.  Head: Normocephalic and atraumatic Ear: TM normal bilaterally Mouth: no erythema or exudates, MMM Eyes: PERRL, EOMI, conjunctivae normal, No scleral icterus.  Neck: Supple, Trachea midline normal ROM, No JVD, mass, thyromegaly, or carotid bruit present.  Cardiovascular: RRR, S1 normal, S2 normal, no MRG, pulses symmetric and intact bilaterally Pulmonary/Chest: CTAB, no wheezes, rales, or rhonchi Abdominal: Soft. Non-tender, non-distended, bowel sounds are normal, no masses, organomegaly, or guarding present.  GU: no CVA tenderness Musculoskeletal: there is point tenderness over the right instep and over the medial ankle ligaments.  There is pain with inversion and eversion.  There is no pain with plantar or dorsiflexion.  No joint deformities, erythema, or stiffness,  Hematology: no cervical, inginal, or axillary adenopathy.  Neurological: A&O x3, Strength is normal and symmetric bilaterally, cranial nerve II-XII are grossly intact, no focal motor deficit, sensory intact to light touch bilaterally.  Skin: Warm, dry and intact. No rash, cyanosis, or clubbing.  Psychiatric: Normal mood and affect. speech and behavior is normal. Judgment and thought content normal. Cognition and memory are normal.   Assessment & Plan:

## 2012-08-17 ENCOUNTER — Ambulatory Visit: Payer: 59 | Admitting: Internal Medicine

## 2012-09-25 ENCOUNTER — Ambulatory Visit (INDEPENDENT_AMBULATORY_CARE_PROVIDER_SITE_OTHER): Payer: 59 | Admitting: Internal Medicine

## 2012-09-25 ENCOUNTER — Encounter: Payer: Self-pay | Admitting: Internal Medicine

## 2012-09-25 VITALS — BP 113/63 | HR 79 | Temp 97.5°F | Ht 72.5 in | Wt 221.6 lb

## 2012-09-25 DIAGNOSIS — I10 Essential (primary) hypertension: Secondary | ICD-10-CM

## 2012-09-25 DIAGNOSIS — S93401A Sprain of unspecified ligament of right ankle, initial encounter: Secondary | ICD-10-CM

## 2012-09-25 DIAGNOSIS — S93409A Sprain of unspecified ligament of unspecified ankle, initial encounter: Secondary | ICD-10-CM

## 2012-09-25 DIAGNOSIS — G5600 Carpal tunnel syndrome, unspecified upper limb: Secondary | ICD-10-CM

## 2012-09-25 DIAGNOSIS — E1149 Type 2 diabetes mellitus with other diabetic neurological complication: Secondary | ICD-10-CM

## 2012-09-25 DIAGNOSIS — G909 Disorder of the autonomic nervous system, unspecified: Secondary | ICD-10-CM

## 2012-09-25 DIAGNOSIS — E1143 Type 2 diabetes mellitus with diabetic autonomic (poly)neuropathy: Secondary | ICD-10-CM

## 2012-09-25 DIAGNOSIS — N529 Male erectile dysfunction, unspecified: Secondary | ICD-10-CM

## 2012-09-25 MED ORDER — NAPROXEN 500 MG PO TABS: 500.0000 mg | ORAL_TABLET | Freq: Two times a day (BID) | ORAL | Status: DC

## 2012-09-25 MED ORDER — SILDENAFIL CITRATE 50 MG PO TABS: 50.0000 mg | ORAL_TABLET | ORAL | Status: DC | PRN

## 2012-09-25 MED ORDER — SILDENAFIL CITRATE 100 MG PO TABS: 50.0000 mg | ORAL_TABLET | ORAL | Status: DC | PRN

## 2012-09-25 NOTE — Patient Instructions (Signed)
1.  Stop at the Pharmacy and pick up two wrist splints for your wrists.  Wear it every night.    2.  Continue your medications for diabetes and blood pressure as prescribed.   3.  For the right foot pain.  Use the Naprosyn 500 mg twice daily for the foot pain.    - Also make sure at the end of the day you lift your feet up and put some ice on the foot for about 20 minutes.    4.  Follow up in 3 months.

## 2012-09-25 NOTE — Progress Notes (Signed)
Subjective:   Patient ID: KHY PITRE male   DOB: June 26, 1956 57 y.o.   MRN: [id number]  HPI: Tyler Wilkinson is a 57 y.o. man who presents to the clinic today for follow up on his chronic medical conditions including diabetes, hypertension, anderectile dysfunction. See Problem focused Assessment and Plan for full details of his chronic medical conditions.   He sprained his ankle in November and has pain at the end of his first proximal MTP for a while since then.  He states that it is worse at the end of the day when he has been on his feet for the whole day.  He hasn't taken anything to help the pain.  He denies swelling or erythema.    He also notes that he has had problems with bilateral hand numbness L>R.  He states that it happens when he is using his hands a lot and sometimes wakes him from sleep where he will have to lay his hands off the bed.  He denies weakness in the hands.    Past Medical History  Diagnosis Date  . DM (diabetes mellitus)   . Hypertension   . Erectile dysfunction   . Dyslipidemia   . CAD (coronary artery disease)     stent RCA 2006 (other residual disease). /  nuclear 2008  no ischemia, inferolateral scar.  . Ejection fraction     EF 55%,echo, 2008, inferolateral hypokinesis,   Current Outpatient Prescriptions  Medication Sig Dispense Refill  . albuterol (PROVENTIL,VENTOLIN) 90 MCG/ACT inhaler Inhale 2 puffs into the lungs every 6 (six) hours as needed for wheezing.  17 g  12  . aspirin EC 81 MG tablet Take 1 tablet (81 mg total) by mouth daily.  150 tablet  2  . benazepril (LOTENSIN) 40 MG tablet Take 1 tablet (40 mg total) by mouth daily.  90 tablet  4  . Blood Glucose Monitoring Suppl (ONE TOUCH ULTRA 2) W/DEVICE KIT Use to check blood sugar sup to 6 times daily Dx code 250.00  1 each  0  . carvedilol (COREG) 6.25 MG tablet Take 1 tablet (6.25 mg total) by mouth 2 (two) times daily.  120 tablet  3  . Flaxseed, Linseed, (FLAX SEED OIL PO) Take by mouth  daily.        Marland Kitchen glucose blood (ONE TOUCH ULTRA TEST) test strip 1 each by Other route 6 (six) times daily.  200 each  11  . hydrochlorothiazide (HYDRODIURIL) 25 MG tablet Take 1 tablet (25 mg total) by mouth daily.  60 tablet  5  . HYDROcodone-acetaminophen (VICODIN) 5-500 MG per tablet Take 1-2 tablets by mouth every 6 (six) hours as needed for pain.  30 tablet  3  . insulin glargine (LANTUS) 100 UNIT/ML injection Inject 60 Units into the skin at bedtime.  3 vial  5  . insulin regular (HUMULIN R) 100 units/mL injection Take 10-30 units per sliding scale with each meal.  30 mL  4  . Insulin Syringe-Needle U-100 (INSULIN SYRINGE .5CC/31GX5/16") 31G X 5/16" 0.5 ML MISC Use to inject insulin 3 times a day Dx code 250.00  100 each  12  . latanoprost (XALATAN) 0.005 % ophthalmic solution Place 1 drop into both eyes at bedtime.      Marland Kitchen levocetirizine (XYZAL) 5 MG tablet Take 1 tablet (5 mg total) by mouth every evening.  30 tablet  11  . metoCLOPramide (REGLAN) 5 MG tablet Take 1 tablet (5 mg total) by mouth 4 (four)  times daily - after meals and at bedtime.  120 tablet  1  . naproxen sodium (ALEVE) 220 MG tablet Take 2 tablets (440 mg total) by mouth 2 (two) times daily with a meal.      . ONETOUCH DELICA LANCETS MISC Use to check blood sugar sup to 6 times daily. Dx code 250.00  200 each  12  . pravastatin (PRAVACHOL) 40 MG tablet Take 1 tablet (40 mg total) by mouth daily.  90 tablet  4  . repaglinide (PRANDIN) 1 MG tablet Take up to 12 tablets a day, take with lunch and at bedtime as instructed.  360 tablet  11  . vardenafil (LEVITRA) 10 MG tablet Take 1 tablet (10 mg total) by mouth daily as needed for erectile dysfunction.  10 tablet  0   No family history on file. History   Social History  . Marital Status: Single    Spouse Name: N/A    Number of Children: N/A  . 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 Smoking status: Former Games developer  . Smokeless tobacco: None  . Alcohol Use: None  . Drug Use: None  . Sexually Active: None   Other Topics Concern  . None   Social History Narrative  . None   Review of Systems: A full 12 system ROS is negative except as noted in the HPI and A&P.   Objective:  Physical Exam: Filed Vitals:   09/25/12 1500  BP: 113/63  Pulse: 79  Temp: 97.5 F (36.4 C)  TempSrc: Oral  Height: 6' 0.5" (1.842 m)  Weight: 221 lb 9.6 oz (100.517 kg)  SpO2: 98%   Constitutional: Vital signs reviewed.  Patient is a well-developed and well-nourished man in no acute distress and cooperative with exam. Alert and oriented x3.  Head: Normocephalic and atraumatic Ear: TM normal bilaterally Mouth: no erythema or exudates, MMM Eyes: PERRL, EOMI, conjunctivae normal, No scleral icterus.  Neck: Supple, Trachea midline normal ROM, No JVD, mass, thyromegaly, or carotid bruit present.  Cardiovascular: RRR, S1 normal, S2 normal, no MRG, pulses symmetric and intact bilaterally Pulmonary/Chest: CTAB, no wheezes, rales, or rhonchi Abdominal: Soft. Non-tender, non-distended, bowel sounds are normal, no masses, organomegaly, or guarding present.  GU: no CVA tenderness Musculoskeletal: point tenderness over the distal end of the first MTP on the right.  No pain with movement, active or passive.  There is mild swelling noted in the foot.   No joint deformities, erythema, or stiffness,  Hematology: no cervical, inginal, or axillary adenopathy.  Neurological: A&O x3, Strength is normal and symmetric bilaterally, cranial nerve II-XII are grossly intact, no focal motor deficit, sensory intact to light touch bilaterally. Bilateral tinnel's test positive over the Median nerve.  Median nerve compression test is positive L>R.   Skin: Warm, dry and intact. No rash, cyanosis, or clubbing.  Psychiatric: Normal mood and affect. speech and behavior is normal. Judgment and thought content normal.  Cognition and memory are normal.   Assessment & Plan:

## 2012-12-28 ENCOUNTER — Other Ambulatory Visit: Payer: Self-pay | Admitting: Internal Medicine

## 2012-12-30 ENCOUNTER — Telehealth: Payer: Self-pay | Admitting: *Deleted

## 2012-12-30 NOTE — Telephone Encounter (Signed)
I actually was at a lecture last week about lyme's disease in [place] and if it was here in [place] the chances of getting Lyme are essentially zero.    Watching for rash, flu like symptoms, or arthralgias are the main things to watch out for.  There is no reason he can't have his tooth taken out on the 22nd.

## 2012-12-30 NOTE — Telephone Encounter (Signed)
Talked with pt and informed him

## 2012-12-30 NOTE — Telephone Encounter (Signed)
Pt called and stated he was bit by a tic yesterday.  He is concerned about RMSF or Lyme disease. Pt has the tic in bag and will keep.  I advised his to be aware of rash, flu like symptoms for a few weeks, mark his calendar.  He is having dental extraction on May 22 and he wants to know if that will be safe for him. What else can I tell pt to watch for?  Pt # [id number]

## 2013-01-05 ENCOUNTER — Encounter (HOSPITAL_BASED_OUTPATIENT_CLINIC_OR_DEPARTMENT_OTHER): Payer: Self-pay

## 2013-01-05 ENCOUNTER — Emergency Department (HOSPITAL_BASED_OUTPATIENT_CLINIC_OR_DEPARTMENT_OTHER)
Admission: EM | Admit: 2013-01-05 | Discharge: 2013-01-05 | Disposition: A | Discharge status: Home / Self Care | Payer: 59 | Attending: Emergency Medicine | Admitting: Emergency Medicine

## 2013-01-05 ENCOUNTER — Emergency Department (HOSPITAL_BASED_OUTPATIENT_CLINIC_OR_DEPARTMENT_OTHER): Payer: 59

## 2013-01-05 ENCOUNTER — Telehealth: Payer: Self-pay | Admitting: *Deleted

## 2013-01-05 DIAGNOSIS — E119 Type 2 diabetes mellitus without complications: Secondary | ICD-10-CM | POA: Insufficient documentation

## 2013-01-05 DIAGNOSIS — E785 Hyperlipidemia, unspecified: Secondary | ICD-10-CM | POA: Insufficient documentation

## 2013-01-05 DIAGNOSIS — Z794 Long term (current) use of insulin: Secondary | ICD-10-CM | POA: Insufficient documentation

## 2013-01-05 DIAGNOSIS — I2510 Atherosclerotic heart disease of native coronary artery without angina pectoris: Secondary | ICD-10-CM | POA: Insufficient documentation

## 2013-01-05 DIAGNOSIS — R42 Dizziness and giddiness: Secondary | ICD-10-CM | POA: Insufficient documentation

## 2013-01-05 DIAGNOSIS — R110 Nausea: Secondary | ICD-10-CM | POA: Insufficient documentation

## 2013-01-05 DIAGNOSIS — Z8669 Personal history of other diseases of the nervous system and sense organs: Secondary | ICD-10-CM | POA: Insufficient documentation

## 2013-01-05 DIAGNOSIS — IMO0001 Reserved for inherently not codable concepts without codable children: Secondary | ICD-10-CM | POA: Insufficient documentation

## 2013-01-05 DIAGNOSIS — Z87448 Personal history of other diseases of urinary system: Secondary | ICD-10-CM | POA: Insufficient documentation

## 2013-01-05 DIAGNOSIS — I10 Essential (primary) hypertension: Secondary | ICD-10-CM | POA: Insufficient documentation

## 2013-01-05 DIAGNOSIS — Z87891 Personal history of nicotine dependence: Secondary | ICD-10-CM | POA: Insufficient documentation

## 2013-01-05 DIAGNOSIS — R5381 Other malaise: Secondary | ICD-10-CM | POA: Insufficient documentation

## 2013-01-05 DIAGNOSIS — Z7982 Long term (current) use of aspirin: Secondary | ICD-10-CM | POA: Insufficient documentation

## 2013-01-05 DIAGNOSIS — R0789 Other chest pain: Secondary | ICD-10-CM | POA: Insufficient documentation

## 2013-01-05 DIAGNOSIS — Z79899 Other long term (current) drug therapy: Secondary | ICD-10-CM | POA: Insufficient documentation

## 2013-01-05 DIAGNOSIS — R52 Pain, unspecified: Secondary | ICD-10-CM | POA: Insufficient documentation

## 2013-01-05 DIAGNOSIS — R5383 Other fatigue: Secondary | ICD-10-CM

## 2013-01-05 LAB — URINALYSIS, ROUTINE W REFLEX MICROSCOPIC
Glucose, UA: >1000 mg/dL — AB
Hgb urine dipstick: NEGATIVE
Leukocytes, UA: NEGATIVE
Protein, ur: NEGATIVE mg/dL
Specific Gravity, Urine: 1.023 (ref 1.005–1.030)
pH: 5.5 (ref 5.0–8.0)

## 2013-01-05 LAB — CBC WITH DIFFERENTIAL/PLATELET
Basophils Relative: 0 % (ref 0–1)
Hemoglobin: 14.7 g/dL (ref 13.0–17.0)
Lymphs Abs: 2.6 10*3/uL (ref 0.7–4.0)
MCHC: 35.8 g/dL (ref 30.0–36.0)
Monocytes Relative: 8 % (ref 3–12)
Neutro Abs: 6.2 10*3/uL (ref 1.7–7.7)
Neutrophils Relative %: 60 % (ref 43–77)
Platelets: 217 10*3/uL (ref 150–400)
RBC: 5.09 MIL/uL (ref 4.22–5.81)

## 2013-01-05 LAB — COMPREHENSIVE METABOLIC PANEL
ALT: 44 U/L (ref 0–53)
Albumin: 3.7 g/dL (ref 3.5–5.2)
Alkaline Phosphatase: 108 U/L (ref 39–117)
BUN: 17 mg/dL (ref 6–23)
Chloride: 102 mEq/L (ref 96–112)
Potassium: 4 mEq/L (ref 3.5–5.1)
Sodium: 137 mEq/L (ref 135–145)
Total Bilirubin: 0.2 mg/dL — ABNORMAL LOW (ref 0.3–1.2)

## 2013-01-05 LAB — GLUCOSE, CAPILLARY: Glucose-Capillary: 100 mg/dL — ABNORMAL HIGH (ref 70–99)

## 2013-01-05 LAB — URINE MICROSCOPIC-ADD ON

## 2013-01-05 LAB — TROPONIN I: Troponin I: <0.3 ng/mL (ref <0.30)

## 2013-01-05 MED ORDER — NAPROXEN 500 MG PO TABS: 500.0000 mg | ORAL_TABLET | Freq: Two times a day (BID) | ORAL | Status: DC

## 2013-01-05 MED ORDER — DOXYCYCLINE HYCLATE 100 MG PO CAPS: 100.0000 mg | ORAL_CAPSULE | Freq: Two times a day (BID) | ORAL | Status: DC

## 2013-01-05 MED ORDER — KETOROLAC TROMETHAMINE 60 MG/2ML IM SOLN
60.0000 mg | Freq: Once | INTRAMUSCULAR | Status: AC
  Administered 2013-01-05: 60 mg via INTRAMUSCULAR
  Filled 2013-01-05: qty 2

## 2013-01-05 NOTE — Telephone Encounter (Signed)
Agree with going to Urgent care or trying to get an appointment in the clinic.

## 2013-01-05 NOTE — Telephone Encounter (Signed)
Pt calls and states he is starting to feel very bad, can't truly describe it but tired, achy. Pt is ask to go to urg care for evaluation and is agreeable

## 2013-01-05 NOTE — ED Notes (Addendum)
C/o HA, throat feels scratchy, chest tightness x today-concerned with s/s due to recent tick exposure

## 2013-01-05 NOTE — ED Provider Notes (Addendum)
History     CSN: [id number]  Arrival date & time 01/05/13  1300   First MD Initiated Contact with Patient 01/05/13 1335      Chief Complaint  Patient presents with  . Headache    (Consider location/radiation/quality/duration/timing/severity/associated sxs/prior treatment) HPI Comments: 1 day of diffuse body aches, fatigue, nausea, tightness in chest, headache.  Headache gradual in onset today, now resolved.  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 Patient concerned because he was bitten by two ticks last week, once on 5/13 (forehead), and again on 5/14 (head of penis). He removed the ticks in entireity himself.  He did not feel poorly until today. Denies fever, vomiting, SOB, rash. No leg pain or swelling. No recent travel. Left work today after 3 hours because he felt weak and achy. Hx DM, HTN, CAD with stent 2006, occluded by patient's report. States sugars have been "up and down".  The history is provided by the patient.    Past Medical History  Diagnosis Date  . DM (diabetes mellitus)   . Hypertension   . Erectile dysfunction   . Dyslipidemia   . CAD (coronary artery disease)     stent RCA 2006 (other residual disease). /  nuclear 2008  no ischemia, inferolateral scar.  . Ejection fraction     EF 55%,echo, 2008, inferolateral hypokinesis,  . Sleep apnea     Past Surgical History  Procedure Laterality Date  . Coronary stent placement      No family history on file.  History  Substance Use Topics  . Smoking status: Former Games developer  . Smokeless tobacco: Not on file  . Alcohol Use: Yes      Review of Systems  Constitutional: Positive for activity change, appetite change and fatigue. Negative for fever and chills.  HENT: Negative for congestion and rhinorrhea.   Eyes: Negative for visual disturbance.  Respiratory: Positive for chest tightness. Negative for cough and shortness of breath.   Cardiovascular: Negative for chest pain.  Gastrointestinal: Positive for  nausea. Negative for vomiting and abdominal pain.  Genitourinary: Negative for dysuria and hematuria.  Musculoskeletal: Positive for myalgias and arthralgias.  Skin: Negative for rash.  Neurological: Positive for weakness, light-headedness and headaches. Negative for dizziness.  A complete 10 system review of systems was obtained and all systems are negative except as noted in the HPI and PMH.    Allergies  Review of patient's allergies indicates no known allergies.  Home Medications   Current Outpatient Rx  Name  Route  Sig  Dispense  Refill  . albuterol (PROVENTIL,VENTOLIN) 90 MCG/ACT inhaler   Inhalation   Inhale 2 puffs into the lungs every 6 (six) hours as needed for wheezing.   17 g   12   . aspirin EC 81 MG tablet   Oral   Take 1 tablet (81 mg total) by mouth daily.   150 tablet   2   . benazepril (LOTENSIN) 40 MG tablet   Oral   Take 1 tablet (40 mg total) by mouth daily.   90 tablet   4   . Blood Glucose Monitoring Suppl (ONE TOUCH ULTRA 2) W/DEVICE KIT      Use to check blood sugar sup to 6 times daily Dx code 250.00   1 each   0   . carvedilol (COREG) 6.25 MG tablet   Oral   Take 1 tablet (6.25 mg total) by mouth 2 (two) times daily.   120 tablet   3   .  doxycycline (VIBRAMYCIN) 100 MG capsule   Oral   Take 1 capsule (100 mg total) by mouth 2 (two) times daily.   20 capsule   0   . Flaxseed, Linseed, (FLAX SEED OIL PO)   Oral   Take by mouth daily.           Marland Kitchen glucose blood (ONE TOUCH ULTRA TEST) test strip   Other   1 each by Other route 6 (six) times daily.   200 each   11   . hydrochlorothiazide (HYDRODIURIL) 25 MG tablet   Oral   Take 1 tablet (25 mg total) by mouth daily.   60 tablet   5   . insulin glargine (LANTUS) 100 UNIT/ML injection   Subcutaneous   Inject 60 Units into the skin at bedtime.   3 vial   5   . insulin regular (HUMULIN R) 100 units/mL injection      Take 10-30 units per sliding scale with each meal.   30  mL   4   . Insulin Syringe-Needle U-100 (INSULIN SYRINGE .5CC/31GX5/16") 31G X 5/16" 0.5 ML MISC      Use to inject insulin 3 times a day Dx code 250.00   100 each   12   . latanoprost (XALATAN) 0.005 % ophthalmic solution   Both Eyes   Place 1 drop into both eyes at bedtime.         Marland Kitchen levocetirizine (XYZAL) 5 MG tablet   Oral   Take 1 tablet (5 mg total) by mouth every evening.   30 tablet   2   . EXPIRED: metoCLOPramide (REGLAN) 5 MG tablet   Oral   Take 1 tablet (5 mg total) by mouth 4 (four) times daily - after meals and at bedtime.   120 tablet   1   . naproxen (NAPROSYN) 500 MG tablet   Oral   Take 1 tablet (500 mg total) by mouth 2 (two) times daily with a meal.   60 tablet   3   . naproxen (NAPROSYN) 500 MG tablet   Oral   Take 1 tablet (500 mg total) by mouth 2 (two) times daily.   30 tablet   0   . ONETOUCH DELICA LANCETS MISC      Use to check blood sugar sup to 6 times daily. Dx code 250.00   200 each   12   . pravastatin (PRAVACHOL) 40 MG tablet   Oral   Take 1 tablet (40 mg total) by mouth daily.   90 tablet   4   . repaglinide (PRANDIN) 1 MG tablet      Take up to 12 tablets a day, take with lunch and at bedtime as instructed.   360 tablet   11   . sildenafil (VIAGRA) 100 MG tablet   Oral   Take 0.5 tablets (50 mg total) by mouth as needed for erectile dysfunction.   20 tablet   1   . EXPIRED: vardenafil (LEVITRA) 10 MG tablet   Oral   Take 1 tablet (10 mg total) by mouth daily as needed for erectile dysfunction.   10 tablet   0     BP 127/74  Pulse 78  Temp(Src) 98.1 F (36.7 C) (Oral)  Resp 16  Ht 6' (1.829 m)  Wt 215 lb (97.523 kg)  BMI 29.15 kg/m2  SpO2 100%  Physical Exam  Constitutional: He is oriented to person, place, and time. He appears well-developed and well-nourished. No  distress.  HENT:  Head: Normocephalic and atraumatic.  Mouth/Throat: Oropharynx is clear and moist. No oropharyngeal exudate.  Very  poor dentition.  Eyes: Conjunctivae and EOM are normal. Pupils are equal, round, and reactive to light.  Neck: Normal range of motion. Neck supple.  No meningismus  Cardiovascular: Normal rate, regular rhythm and normal heart sounds.   Pulmonary/Chest: Effort normal and breath sounds normal. No respiratory distress.  Abdominal: Soft. He exhibits distension. There is no tenderness. There is no rebound and no guarding.  Reducible umbilical hernia  Genitourinary:  Punctate scab to penile head with surrounding erythema  Musculoskeletal: Normal range of motion. He exhibits no edema and no tenderness.  Neurological: He is alert and oriented to person, place, and time. No cranial nerve deficit. He exhibits normal muscle tone. Coordination normal.  Skin: Skin is warm. No rash noted.  No rash on palms or soles    ED Course  Procedures (including critical care time)  Labs Reviewed  CBC WITH DIFFERENTIAL - Abnormal; Notable for the following:    Eosinophils Relative 6 (*)    All other components within normal limits  COMPREHENSIVE METABOLIC PANEL - Abnormal; Notable for the following:    Glucose, Bld 121 (*)    Total Bilirubin 0.2 (*)    GFR calc non Af Amer 73 (*)    GFR calc Af Amer 84 (*)    All other components within normal limits  URINALYSIS, ROUTINE W REFLEX MICROSCOPIC - Abnormal; Notable for the following:    Glucose, UA >1000 (*)    All other components within normal limits  GLUCOSE, CAPILLARY - Abnormal; Notable for the following:    Glucose-Capillary 100 (*)    All other components within normal limits  TROPONIN I  URINE MICROSCOPIC-ADD ON  ROCKY MTN SPOTTED FVR AB, IGG-BLOOD  ROCKY MTN SPOTTED FVR AB, IGM-BLOOD   Dg Abd Acute W/chest  01/05/2013   *RADIOLOGY REPORT*  Clinical Data: Abdominal pain.  Headache.  Chest tightness.  Recent tick bite.  Former smoker.  ACUTE ABDOMEN SERIES (ABDOMEN 2 VIEW & CHEST 1 VIEW)  Comparison: No prior abdominal imaging.  Portable chest  x-ray 06/11/2011 and two-view chest x-ray 08/08/2010.  Findings: Bowel gas pattern unremarkable without evidence of obstruction or significant ileus.  No evidence of free air or significant air fluid levels on the erect image.  Moderate stool burden involving the cecum, ascending colon, and transverse colon. Calcifications in the right upper quadrant, localizing inferior to the right 12th rib, may be in the head of the pancreas. Calcifications involving the vasa deferens bilaterally.  Regional skeleton unremarkable.  Cardiomediastinal silhouette unremarkable, unchanged.  Stable moderate central peribronchial thickening.  Lungs otherwise clear. Pulmonary vascularity normal.  No pneumothorax.  No pleural effusions.  IMPRESSION: No acute abdominal or pulmonary abnormality.   Original Report Authenticated By: Hulan Saas, M.D.     1. Body aches   2. Fatigue       MDM  1 day of myalgias, fatigue, headache, tightness in chest.  Headache now resolved. Two tick bites last week.  No rash, vitals stable, neuro intact. Some pain around umbilical hernia which is easily reducible.  Chest tightness atypical for ACS< EKG unchanged. Nontoxic appearing.  Patient reports feeling much better after IM toradol. Aches have resolved.  RMSF titers sent though patient has no headache, rash, hyponatremia, or LFT elevation. Tolerating PO and ambulatory. Empiric doxycyline started, NSAIDs for aches, followup with PCP.    Date: 01/05/2013  Rate: 74  Rhythm: normal sinus rhythm  QRS Axis: normal  Intervals: normal  ST/T Wave abnormalities: normal  Conduction Disutrbances:none  Narrative Interpretation:   Old EKG Reviewed: unchanged    Glynn Octave, MD 01/05/13 1745  Glynn Octave, MD 01/05/13 1747

## 2013-01-05 NOTE — ED Notes (Signed)
MD at bedside. 

## 2013-01-05 NOTE — ED Notes (Signed)
Patient transported to X-ray 

## 2013-01-06 ENCOUNTER — Telehealth (HOSPITAL_COMMUNITY): Payer: Self-pay | Admitting: Emergency Medicine

## 2013-02-14 NOTE — Assessment & Plan Note (Signed)
He appears to have sprained his ankle when he dripped at work.  He states that he has started the worker's compensation paperwork but doesn't know if tey have a preferred provider for assessing his ankle injury and monitoring his recovery.  IN the meantime we will start with Aleve 220 mg BID, ice, and the exercises that I gave him today.

## 2013-02-14 NOTE — Assessment & Plan Note (Signed)
He states that he ha been taking his blood pressure medication and feels like he is doing well.  BP Readings from Last 10 Encounters:  09/25/12 113/63  07/10/12 111/70  03/20/12 122/72   Blood pressure is well controlled.  We will continue his home medications for now.

## 2013-02-14 NOTE — Assessment & Plan Note (Signed)
He states that he is taking his blood pressure medication as prescribed and denies headache, changes in his vision, or dizziness on standing.    BP Readings from Last 3 Encounters:  07/10/12 111/70  03/20/12 122/72  01/16/12 122/70   Blood pressure is well controlled.  WE will continue to monitor for now.

## 2013-02-14 NOTE — Assessment & Plan Note (Signed)
He has symptoms of bilateral carpal tunnel.  We will have him get bilateral wrist splints and wear them at night to protect his wrists.

## 2013-02-14 NOTE — Assessment & Plan Note (Signed)
He states that he has not been taking the prandin still because of the expense.  He has been taking the regular insulin and lantus as prescribed.  He is trying to watch his diet with less sodas.  Lab Results  Component Value Date   HGBA1C 8.6 09/25/2012   HGBA1C 9.4 07/10/2012   HGBA1C 9.5 03/20/2012   Lab Results  Component Value Date   MICROALBUR 0.60 01/05/2010   LDLCALC 82 01/03/2012   A1C is mildly improved today.  I encouraged him to continue taking his medication as prescribed and to make sure that he either brings his regular insulin with him to work or picks up the prandin.

## 2013-02-14 NOTE — Assessment & Plan Note (Signed)
He states that he has not been taking his prandin and is missing days for his insulin.  He states that the prandin is expensive and hard for him to afford.    Hemoglobin A1C (%)  Date Value  07/10/2012 9.4   03/20/2012 9.5   01/03/2012 9.4   06/12/2011 8.1*   A1C is essentially unchanged from previous.  He was encouraged to pick up the prandin and take it as prescribed to help get him the last little bit to his goal of <7.

## 2013-02-14 NOTE — Assessment & Plan Note (Signed)
He is having problems with tendonitis again on the right foot.  We will continue the ice and Aleve therapy  I encouraged him to try to find a time during the day to sit with his foot propped up.

## 2013-02-14 NOTE — Assessment & Plan Note (Signed)
He asks for a refill of his viagra today.  We discussed the cost and that if he is able to afford viagra that he should be able to afford his prandin.  He states that he usually only picks up 1-2 tablets at a time.

## 2013-02-25 ENCOUNTER — Other Ambulatory Visit: Payer: Self-pay

## 2013-03-01 ENCOUNTER — Telehealth: Payer: Self-pay | Admitting: Dietician

## 2013-03-01 DIAGNOSIS — E1143 Type 2 diabetes mellitus with diabetic autonomic (poly)neuropathy: Secondary | ICD-10-CM

## 2013-03-01 MED ORDER — INSULIN DETEMIR 100 UNIT/ML ~~LOC~~ SOLN: 60.0000 [IU] | Freq: Every day | SUBCUTANEOUS | Status: DC

## 2013-03-01 NOTE — Telephone Encounter (Signed)
[employer] covers Levemir better than Lantus patient requests Lantus be changed to Levemir vials. Would like rx to read a range so he can get 3 vials instead of 2. Told him he would have to discuss this with his doctor Needs appointment- best time for him is around 11AM  on a Friday he can get off from work. Will ask front office to schedule this ASAP.

## 2013-03-02 ENCOUNTER — Telehealth: Payer: Self-pay | Admitting: Dietician

## 2013-03-02 NOTE — Telephone Encounter (Signed)
Had not taken lantus in a week or so, was having troubles affording it. Now says started on 50 units of levemir today with plans to increase it to 60 units. levemir is affordable for him at 10$ for a 20 day supply.  Confirmed that our office is working with him to schedule a visit with doctor.

## 2013-03-09 ENCOUNTER — Encounter: Payer: 59 | Admitting: Internal Medicine

## 2013-03-12 ENCOUNTER — Ambulatory Visit (INDEPENDENT_AMBULATORY_CARE_PROVIDER_SITE_OTHER): Payer: 59 | Admitting: Internal Medicine

## 2013-03-12 VITALS — BP 118/69 | HR 75 | Temp 97.2°F | Ht 76.0 in | Wt 219.7 lb

## 2013-03-12 DIAGNOSIS — E119 Type 2 diabetes mellitus without complications: Secondary | ICD-10-CM

## 2013-03-12 DIAGNOSIS — G5600 Carpal tunnel syndrome, unspecified upper limb: Secondary | ICD-10-CM

## 2013-03-12 DIAGNOSIS — I10 Essential (primary) hypertension: Secondary | ICD-10-CM

## 2013-03-12 DIAGNOSIS — E1149 Type 2 diabetes mellitus with other diabetic neurological complication: Secondary | ICD-10-CM

## 2013-03-12 DIAGNOSIS — E782 Mixed hyperlipidemia: Secondary | ICD-10-CM

## 2013-03-12 DIAGNOSIS — G909 Disorder of the autonomic nervous system, unspecified: Secondary | ICD-10-CM

## 2013-03-12 DIAGNOSIS — N529 Male erectile dysfunction, unspecified: Secondary | ICD-10-CM

## 2013-03-12 DIAGNOSIS — G5602 Carpal tunnel syndrome, left upper limb: Secondary | ICD-10-CM

## 2013-03-12 DIAGNOSIS — E1143 Type 2 diabetes mellitus with diabetic autonomic (poly)neuropathy: Secondary | ICD-10-CM

## 2013-03-12 LAB — CBC WITH DIFFERENTIAL/PLATELET
Basophils Relative: 0 % (ref 0–1)
Eosinophils Absolute: 0.5 10*3/uL (ref 0.0–0.7)
Eosinophils Relative: 7 % — ABNORMAL HIGH (ref 0–5)
Lymphs Abs: 2.1 10*3/uL (ref 0.7–4.0)
MCH: 28.2 pg (ref 26.0–34.0)
MCHC: 35.2 g/dL (ref 30.0–36.0)
MCV: 80 fL (ref 78.0–100.0)
Neutrophils Relative %: 59 % (ref 43–77)
Platelets: 220 10*3/uL (ref 150–400)
RDW: 13.8 % (ref 11.5–15.5)

## 2013-03-12 LAB — POCT GLYCOSYLATED HEMOGLOBIN (HGB A1C): Hemoglobin A1C: 10.1

## 2013-03-12 MED ORDER — REPAGLINIDE 1 MG PO TABS: ORAL_TABLET | ORAL | Status: DC

## 2013-03-12 MED ORDER — PRAVASTATIN SODIUM 40 MG PO TABS: 40.0000 mg | ORAL_TABLET | Freq: Every day | ORAL | Status: DC

## 2013-03-12 MED ORDER — NAPROXEN 500 MG PO TABS: 500.0000 mg | ORAL_TABLET | Freq: Two times a day (BID) | ORAL | Status: DC

## 2013-03-12 MED ORDER — CARVEDILOL 6.25 MG PO TABS: 6.2500 mg | ORAL_TABLET | Freq: Two times a day (BID) | ORAL | Status: DC

## 2013-03-12 MED ORDER — INSULIN REGULAR HUMAN 100 UNIT/ML IJ SOLN: INTRAMUSCULAR | Status: DC

## 2013-03-12 MED ORDER — SILDENAFIL CITRATE 100 MG PO TABS: 50.0000 mg | ORAL_TABLET | ORAL | Status: DC | PRN

## 2013-03-12 MED ORDER — GLUCOSE BLOOD VI STRP: 1.0000 | ORAL_STRIP | Freq: Every day | Status: DC

## 2013-03-12 MED ORDER — BENAZEPRIL HCL 40 MG PO TABS: 40.0000 mg | ORAL_TABLET | Freq: Every day | ORAL | Status: DC

## 2013-03-12 MED ORDER — HYDROCHLOROTHIAZIDE 25 MG PO TABS: 25.0000 mg | ORAL_TABLET | Freq: Every day | ORAL | Status: DC

## 2013-03-12 NOTE — Patient Instructions (Addendum)
Create blood sugar log (3 times a day)  Meet with Lupita Leash next week to assess diabetes.  F/U with PCP in 1-2 months

## 2013-03-12 NOTE — Progress Notes (Signed)
Subjective:   Patient ID: Tyler Wilkinson male   DOB: 1956-01-20 57 y.o.   MRN: [id number]  HPI: Tyler Wilkinson is a 57 y.o. male that his here today for a routine checkup. See problem based A+P for detailed history.    Past Medical History  Diagnosis Date  . DM (diabetes mellitus)   . Hypertension   . Erectile dysfunction   . Dyslipidemia   . CAD (coronary artery disease)     stent RCA 2006 (other residual disease). /  nuclear 2008  no ischemia, inferolateral scar.  . Ejection fraction     EF 55%,echo, 2008, inferolateral hypokinesis,  . Sleep apnea    Current Outpatient Prescriptions  Medication Sig Dispense Refill  . aspirin EC 81 MG tablet Take 1 tablet (81 mg total) by mouth daily.  150 tablet  2  . benazepril (LOTENSIN) 40 MG tablet Take 1 tablet (40 mg total) by mouth daily.  90 tablet  4  . Blood Glucose Monitoring Suppl (ONE TOUCH ULTRA 2) W/DEVICE KIT Use to check blood sugar sup to 6 times daily Dx code 250.00  1 each  0  . carvedilol (COREG) 6.25 MG tablet Take 1 tablet (6.25 mg total) by mouth 2 (two) times daily.  120 tablet  3  . Flaxseed, Linseed, (FLAX SEED OIL PO) Take by mouth daily.        Marland Kitchen glucose blood (ONE TOUCH ULTRA TEST) test strip 1 each by Other route 6 (six) times daily.  200 each  11  . hydrochlorothiazide (HYDRODIURIL) 25 MG tablet Take 1 tablet (25 mg total) by mouth daily.  60 tablet  5  . insulin detemir (LEVEMIR) 100 UNIT/ML injection Inject 0.6 mLs (60 Units total) into the skin at bedtime.  20 mL  12  . insulin regular (HUMULIN R) 100 units/mL injection Take 10-30 units per sliding scale with each meal.  30 mL  4  . Insulin Syringe-Needle U-100 (INSULIN SYRINGE .5CC/31GX5/16") 31G X 5/16" 0.5 ML MISC Use to inject insulin 3 times a day Dx code 250.00  100 each  12  . latanoprost (XALATAN) 0.005 % ophthalmic solution Place 1 drop into both eyes at bedtime.      Marland Kitchen levocetirizine (XYZAL) 5 MG tablet Take 1 tablet (5 mg total) by mouth every  evening.  30 tablet  2  . naproxen (NAPROSYN) 500 MG tablet Take 1 tablet (500 mg total) by mouth 2 (two) times daily with a meal.  60 tablet  3  . ONETOUCH DELICA LANCETS MISC Use to check blood sugar sup to 6 times daily. Dx code 250.00  200 each  12  . pravastatin (PRAVACHOL) 40 MG tablet Take 1 tablet (40 mg total) by mouth daily.  90 tablet  4  . sildenafil (VIAGRA) 100 MG tablet Take 0.5 tablets (50 mg total) by mouth as needed for erectile dysfunction.  20 tablet  1  . metoCLOPramide (REGLAN) 5 MG tablet Take 1 tablet (5 mg total) by mouth 4 (four) times daily - after meals and at bedtime.  120 tablet  1  . repaglinide (PRANDIN) 1 MG tablet Take up to 12 tablets a day, take with lunch and at bedtime as instructed.  360 tablet  11  . vardenafil (LEVITRA) 10 MG tablet Take 1 tablet (10 mg total) by mouth daily as needed for erectile dysfunction.  10 tablet  0   No current facility-administered medications for this visit.   No family history on  file. History   Social History  . Marital Status: Single    Spouse Name: N/A    Number of Children: N/A  . 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 Smoking status: Former Games developer  . Smokeless tobacco: Not on file  . Alcohol Use: Yes  . Drug Use: No  . Sexually Active: Not on file   Other Topics Concern  . Not on file   Social History Narrative  . No narrative on file   Review of Systems:  Review of Systems  Constitutional: Negative for fever, chills and diaphoresis.  HENT: Negative for sore throat.   Eyes: Negative for blurred vision and double vision.  Respiratory: Negative for cough, sputum production and shortness of breath.   Cardiovascular: Negative for chest pain, palpitations and leg swelling.  Gastrointestinal: Positive for abdominal pain. Negative for nausea, vomiting, diarrhea, constipation, blood in stool and melena.    Genitourinary: Negative for dysuria, urgency and frequency.  Skin: Negative for itching and rash.  Neurological: Negative for weakness and headaches.     Objective:  Physical Exam: Filed Vitals:   03/12/13 1420  BP: 118/69  Pulse: 75  Temp: 97.2 F (36.2 C)  TempSrc: Oral  Height: 6\' 4"  (1.93 m)  Weight: 219 lb 11.2 oz (99.655 kg)  SpO2: 96%   Physical Exam  Constitutional: He is oriented to person, place, and time. He appears well-developed and well-nourished. No distress.  HENT:  Head: Normocephalic and atraumatic.  Nose: Nose normal.  Mouth/Throat: Oropharynx is clear and moist. No oropharyngeal exudate.  Eyes: EOM are normal. Pupils are equal, round, and reactive to light.  Injected conjunctiva  Neck: Normal range of motion. Neck supple. No tracheal deviation present. No thyromegaly present.  Cardiovascular: Normal rate, regular rhythm and normal heart sounds.  Exam reveals no friction rub.   No murmur heard. Pulmonary/Chest: Effort normal and breath sounds normal. No respiratory distress. He has no wheezes. He has no rales.  Abdominal: Soft. Bowel sounds are normal. He exhibits no distension. There is no tenderness.  Lymphadenopathy:    He has no cervical adenopathy.  Neurological: He is alert and oriented to person, place, and time.  Skin: He is not diaphoretic.  Psychiatric: He has a normal mood and affect. His behavior is normal.  Mildly pressured speech.     Assessment & Plan:

## 2013-03-12 NOTE — Assessment & Plan Note (Signed)
Lipid Panel     Component Value Date/Time   CHOL 151 01/03/2012 1659   TRIG 164* 01/03/2012 1659   HDL 36* 01/03/2012 1659   CHOLHDL 4.2 01/03/2012 1659   VLDL 33 01/03/2012 1659   LDLCALC 82 01/03/2012 1659    Checked lipids today and will f/u as indicated.

## 2013-03-12 NOTE — Assessment & Plan Note (Addendum)
Lab Results  Component Value Date   HGBA1C 10.1 03/12/2013   HGBA1C 8.6 09/25/2012   HGBA1C 9.4 07/10/2012     Assessment: Diabetes control: poor control (HgbA1C >9%) Progress toward A1C goal:  deteriorated Comments: The patient admits to eating fried foods and foods high in carbs. He states that the knows he should not eat this poorly, but he cannot change his ways. He was recently changed from lantus to levemir due his insurance coverage. Since changing, he initially had elevated blood sugars that have become better. The patient failed to bring his glucometer today.  Plan: Medications:  continue current medications Home glucose monitoring: Frequency: 3 times a day Timing: before meals Instruction/counseling given: reminded to bring blood glucose meter & log to each visit, reminded to bring medications to each visit, discussed the need for weight loss and discussed diet Educational resources provided: brochure Other plans: The patient was referred to our CDE for evaluation for blood sugars, diet and insulin regimen. I plan to modify the patients insulin after he meets with our CDE. The patient was instructed to bring his glucometer to this visit next week. Today, we checked labs and will f/u as indicated.

## 2013-03-12 NOTE — Assessment & Plan Note (Signed)
BP Readings from Last 3 Encounters:  03/12/13 118/69  01/05/13 127/74  09/25/12 113/63    Lab Results  Component Value Date   NA 137 01/05/2013   K 4.0 01/05/2013   CREATININE 1.10 01/05/2013    Assessment: Blood pressure control: controlled Progress toward BP goal:  at goal Comments: The patient is well controlled on his current BP regimen. The patient is taking L-Arg as a supplement which he thinks is helping.   Plan: Medications:  continue current medications Other plans: Stable on current regimen. CCM and f/u at future visits.

## 2013-03-13 LAB — LIPID PANEL
HDL: 37 mg/dL — ABNORMAL LOW (ref >39)
LDL Cholesterol: 86 mg/dL (ref 0–99)
Triglycerides: 69 mg/dL (ref <150)
VLDL: 14 mg/dL (ref 0–40)

## 2013-03-13 LAB — MICROALBUMIN / CREATININE URINE RATIO
Creatinine, Urine: 247.6 mg/dL
Microalb, Ur: 1.08 mg/dL (ref 0.00–1.89)

## 2013-03-13 LAB — BASIC METABOLIC PANEL WITH GFR
CO2: 24 mEq/L (ref 19–32)
Calcium: 9 mg/dL (ref 8.4–10.5)
Creat: 1.11 mg/dL (ref 0.50–1.35)
GFR, Est African American: 85 mL/min
Glucose, Bld: 101 mg/dL — ABNORMAL HIGH (ref 70–99)
Sodium: 137 mEq/L (ref 135–145)

## 2013-03-16 NOTE — Progress Notes (Signed)
I saw and evaluated the patient.  I personally confirmed the key portions of the history and exam documented by Dr. Komanski and I reviewed pertinent patient test results.  The assessment, diagnosis, and plan were formulated together and I agree with the documentation in the resident's note.  

## 2013-04-26 ENCOUNTER — Encounter: Payer: 59 | Admitting: Dietician

## 2013-04-27 ENCOUNTER — Encounter: Payer: Self-pay | Admitting: Internal Medicine

## 2013-05-18 ENCOUNTER — Encounter: Payer: 59 | Admitting: Internal Medicine

## 2013-05-19 NOTE — Addendum Note (Signed)
Addended by: Neomia Dear on: 05/19/2013 05:42 PM   Modules accepted: Orders

## 2013-06-08 ENCOUNTER — Emergency Department (INDEPENDENT_AMBULATORY_CARE_PROVIDER_SITE_OTHER)
Admission: EM | Admit: 2013-06-08 | Discharge: 2013-06-08 | Disposition: A | Discharge status: Home / Self Care | Payer: 59 | Source: Home / Self Care

## 2013-06-08 ENCOUNTER — Encounter (HOSPITAL_COMMUNITY): Payer: Self-pay | Admitting: Emergency Medicine

## 2013-06-08 ENCOUNTER — Telehealth: Payer: Self-pay | Admitting: *Deleted

## 2013-06-08 ENCOUNTER — Telehealth: Payer: Self-pay | Admitting: Dietician

## 2013-06-08 DIAGNOSIS — H8110 Benign paroxysmal vertigo, unspecified ear: Secondary | ICD-10-CM

## 2013-06-08 MED ORDER — MECLIZINE HCL 25 MG PO TABS: 25.0000 mg | ORAL_TABLET | Freq: Four times a day (QID) | ORAL | Status: DC

## 2013-06-08 NOTE — Telephone Encounter (Signed)
Patient is interested in learning about newer diabetes medications, specifically Bydureon. Explained that this is a once weekly injection of long acting Byetta, which is a synthetic hormone that helps control blood sugar and possibly appetite without low blood sugar risk. Side effects and risk of nausea, vomiting and pancreatitis also discussed with patient and he was given patient education about this class of medicine. He asks that CDE tell his doctor that he is interested in this medicine and desires better diabetes control, some weight loss and fewer injections. Patient scheduled appointment with CDE for November.

## 2013-06-08 NOTE — ED Provider Notes (Signed)
CSN: [id number]     Arrival date & time 06/08/13  1334 History   First MD Initiated Contact with Patient 06/08/13 1357     Chief Complaint  Patient presents with  . Cerebrovascular Accident    possible stroke symptoms.    (Consider location/radiation/quality/duration/timing/severity/associated sxs/prior Treatment) HPI Comments: In her 57 year old male or is experienced dizziness 48 hours ago after rolling over in bed. This lasted for a few seconds and stopped when he rolled back over . Today the same thing happen and he rolled over in bed he had dizziness that lasted little bit longer than a few seconds but abated after rolling to the other side. He states since this occurred this morning he has had a mild headache date, mild nausea and and feeling fuzzy inside and occasional staggering with walking.    Past Medical History  Diagnosis Date  . DM (diabetes mellitus)   . Hypertension   . Erectile dysfunction   . Dyslipidemia   . CAD (coronary artery disease)     stent RCA 2006 (other residual disease). /  nuclear 2008  no ischemia, inferolateral scar.  . Ejection fraction     EF 55%,echo, 2008, inferolateral hypokinesis,  . Sleep apnea    Past Surgical History  Procedure Laterality Date  . Coronary stent placement     History reviewed. No pertinent family history. History  Substance Use Topics  . Smoking status: Former Games developer  . Smokeless tobacco: Not on file  . Alcohol Use: Yes    Review of Systems  Constitutional: Positive for activity change. Negative for fever, chills, diaphoresis and fatigue.  HENT: Negative for congestion, ear discharge, ear pain, hearing loss, postnasal drip, rhinorrhea, sinus pressure, sore throat, tinnitus and trouble swallowing.   Eyes: Negative.   Respiratory: Negative for cough, choking, chest tightness and shortness of breath.   Cardiovascular: Negative for chest pain, palpitations and leg swelling.  Gastrointestinal: Positive for nausea.   Genitourinary: Negative.   Neurological: Positive for dizziness and headaches. Negative for tremors, seizures, syncope, speech difficulty, weakness and numbness.    Allergies  Review of patient's allergies indicates no known allergies.  Home Medications   Current Outpatient Rx  Name  Route  Sig  Dispense  Refill  . benazepril (LOTENSIN) 40 MG tablet   Oral   Take 1 tablet (40 mg total) by mouth daily.   90 tablet   4   . carvedilol (COREG) 6.25 MG tablet   Oral   Take 1 tablet (6.25 mg total) by mouth 2 (two) times daily.   120 tablet   3   . hydrochlorothiazide (HYDRODIURIL) 25 MG tablet   Oral   Take 1 tablet (25 mg total) by mouth daily.   60 tablet   5   . insulin detemir (LEVEMIR) 100 UNIT/ML injection   Subcutaneous   Inject 0.6 mLs (60 Units total) into the skin at bedtime.   20 mL   12   . latanoprost (XALATAN) 0.005 % ophthalmic solution   Both Eyes   Place 1 drop into both eyes at bedtime.         . Blood Glucose Monitoring Suppl (ONE TOUCH ULTRA 2) W/DEVICE KIT      Use to check blood sugar sup to 6 times daily Dx code 250.00   1 each   0   . Flaxseed, Linseed, (FLAX SEED OIL PO)   Oral   Take by mouth daily.           Marland Kitchen  glucose blood (ONE TOUCH ULTRA TEST) test strip   Other   1 each by Other route 6 (six) times daily.   200 each   11   . insulin regular (HUMULIN R) 100 units/mL injection      Take 10-30 units per sliding scale with each meal.   30 mL   4   . Insulin Syringe-Needle U-100 (INSULIN SYRINGE .5CC/31GX5/16") 31G X 5/16" 0.5 ML MISC      Use to inject insulin 3 times a day Dx code 250.00   100 each   12   . levocetirizine (XYZAL) 5 MG tablet   Oral   Take 1 tablet (5 mg total) by mouth every evening.   30 tablet   2   . meclizine (ANTIVERT) 25 MG tablet   Oral   Take 1 tablet (25 mg total) by mouth 4 (four) times daily. Prn dizziness   28 tablet   0   . EXPIRED: metoCLOPramide (REGLAN) 5 MG tablet   Oral    Take 1 tablet (5 mg total) by mouth 4 (four) times daily - after meals and at bedtime.   120 tablet   1   . naproxen (NAPROSYN) 500 MG tablet   Oral   Take 1 tablet (500 mg total) by mouth 2 (two) times daily with a meal.   60 tablet   3   . ONETOUCH DELICA LANCETS MISC      Use to check blood sugar sup to 6 times daily. Dx code 250.00   200 each   12   . pravastatin (PRAVACHOL) 40 MG tablet   Oral   Take 1 tablet (40 mg total) by mouth daily.   90 tablet   4   . repaglinide (PRANDIN) 1 MG tablet      Take up to 12 tablets a day, take with lunch and at bedtime as instructed.   360 tablet   11   . sildenafil (VIAGRA) 100 MG tablet   Oral   Take 0.5 tablets (50 mg total) by mouth as needed for erectile dysfunction.   20 tablet   1   . EXPIRED: vardenafil (LEVITRA) 10 MG tablet   Oral   Take 1 tablet (10 mg total) by mouth daily as needed for erectile dysfunction.   10 tablet   0    BP 127/63  Pulse 81  Resp 14  SpO2 100% Physical Exam  Nursing note and vitals reviewed. Constitutional: He is oriented to person, place, and time. He appears well-developed and well-nourished. No distress.  HENT:  Right Ear: External ear normal.  Left Ear: External ear normal.  Mouth/Throat: Oropharynx is clear and moist.  Soft palate rises symmetrically  Eyes: Conjunctivae and EOM are normal. Pupils are equal, round, and reactive to light.  Neck: Normal range of motion. Neck supple.  Cardiovascular: Normal rate, regular rhythm and normal heart sounds.   Pulmonary/Chest: Effort normal and breath sounds normal. He has no rales.  Occasional rare expiratory wheeze.  Musculoskeletal: He exhibits no edema.  Lymphadenopathy:    He has no cervical adenopathy.  Neurological: He is alert and oriented to person, place, and time. He has normal strength and normal reflexes. He displays no tremor. No cranial nerve deficit or sensory deficit. He exhibits normal muscle tone. He displays a  negative Romberg sign. He displays no seizure activity. Coordination normal.  No dizziness or vertigo to be reproduced by having the patient all over in the bed, change positions  to lie down. There is no focal weakness or paresthesias. Motor is 5 over 5. Speech is clear. He talks incessantly.  Skin: Skin is warm and dry.  Psychiatric: He has a normal mood and affect.    ED Course  Procedures (including critical care time) Labs Review Labs Reviewed  GLUCOSE, CAPILLARY - Abnormal; Notable for the following:    Glucose-Capillary 194 (*)    All other components within normal limits   Imaging Review No results found.    MDM   1. BPV (benign positional vertigo)      History and physical exam is remarkable for vertigo and benign positional vertigo.No  Focal or unilateral signs Antivert 25mg  q 6-8 h prn dizziness. May cause drowsiness, No work for 2 days. Drink plenty of fluids.  For signs of heart problems or stroke as discussed and given in written form go to the ED  Hayden Rasmussen, NP 06/08/13 1447

## 2013-06-08 NOTE — Telephone Encounter (Signed)
Agree he should go to ER

## 2013-06-08 NOTE — Telephone Encounter (Signed)
Pt states that since Sunday he has awaken w/ very dizzy- spinning vision or sense of spinning, h/a also. Denies shortness of breath, weakness, speech changes. He has hx of mi appr 7 years ago. He is quite concerned and is ask to go to ED or urg care for evaluation, he will call for f/u. He is agreeable

## 2013-06-08 NOTE — ED Notes (Signed)
Reports being sent over from another facility. Stating needing to be seen to due having possible stroke symptoms.  Pt presents c/o headache since Saturday, head feels foggy, unsteady gate, dizziness.   Hx of BM. Hypertension. Heart stent.  Pt has taken aspirin with no relief in symptoms.

## 2013-06-09 NOTE — ED Provider Notes (Signed)
Medical screening examination/treatment/procedure(s) were performed by resident physician or non-physician practitioner and as supervising physician I was immediately available for consultation/collaboration.   Barkley Bruns MD.   Linna Hoff, MD 06/09/13 2110

## 2013-06-23 ENCOUNTER — Other Ambulatory Visit: Payer: Self-pay | Admitting: *Deleted

## 2013-06-24 MED ORDER — LEVOCETIRIZINE DIHYDROCHLORIDE 5 MG PO TABS: 5.0000 mg | ORAL_TABLET | Freq: Every evening | ORAL | Status: DC

## 2013-06-25 ENCOUNTER — Encounter: Payer: 59 | Admitting: Dietician

## 2013-06-25 ENCOUNTER — Encounter: Payer: Self-pay | Admitting: Internal Medicine

## 2013-07-06 ENCOUNTER — Encounter: Payer: Self-pay | Admitting: Internal Medicine

## 2013-07-06 ENCOUNTER — Ambulatory Visit (INDEPENDENT_AMBULATORY_CARE_PROVIDER_SITE_OTHER): Payer: 59 | Admitting: Internal Medicine

## 2013-07-06 VITALS — BP 119/71 | HR 111 | Temp 97.0°F | Wt 218.1 lb

## 2013-07-06 DIAGNOSIS — E1149 Type 2 diabetes mellitus with other diabetic neurological complication: Secondary | ICD-10-CM

## 2013-07-06 DIAGNOSIS — G909 Disorder of the autonomic nervous system, unspecified: Secondary | ICD-10-CM

## 2013-07-06 DIAGNOSIS — E1143 Type 2 diabetes mellitus with diabetic autonomic (poly)neuropathy: Secondary | ICD-10-CM

## 2013-07-06 DIAGNOSIS — E782 Mixed hyperlipidemia: Secondary | ICD-10-CM

## 2013-07-06 DIAGNOSIS — N529 Male erectile dysfunction, unspecified: Secondary | ICD-10-CM

## 2013-07-06 DIAGNOSIS — I10 Essential (primary) hypertension: Secondary | ICD-10-CM

## 2013-07-06 DIAGNOSIS — J069 Acute upper respiratory infection, unspecified: Secondary | ICD-10-CM

## 2013-07-06 DIAGNOSIS — E119 Type 2 diabetes mellitus without complications: Secondary | ICD-10-CM

## 2013-07-06 LAB — GLUCOSE, CAPILLARY: Glucose-Capillary: 86 mg/dL (ref 70–99)

## 2013-07-06 LAB — POCT GLYCOSYLATED HEMOGLOBIN (HGB A1C): Hemoglobin A1C: 9.4

## 2013-07-06 MED ORDER — GUAIFENESIN-DM 100-10 MG/5ML PO SYRP: 5.0000 mL | ORAL_SOLUTION | ORAL | Status: DC | PRN

## 2013-07-06 MED ORDER — REPAGLINIDE 2 MG PO TABS: ORAL_TABLET | ORAL | Status: DC

## 2013-07-06 MED ORDER — PRAVASTATIN SODIUM 40 MG PO TABS: 40.0000 mg | ORAL_TABLET | Freq: Every day | ORAL | Status: DC

## 2013-07-06 MED ORDER — ALBUTEROL SULFATE HFA 108 (90 BASE) MCG/ACT IN AERS: 2.0000 | INHALATION_SPRAY | Freq: Four times a day (QID) | RESPIRATORY_TRACT | Status: DC | PRN

## 2013-07-06 MED ORDER — SILDENAFIL CITRATE 100 MG PO TABS: 50.0000 mg | ORAL_TABLET | ORAL | Status: DC | PRN

## 2013-07-06 NOTE — Progress Notes (Signed)
Subjective:   Patient ID: Tyler Wilkinson male   DOB: [birth year] 57 y.o.   MRN: [id number]  HPI: Tyler Wilkinson is a 57 y.o. man with a pmhx detailed below who comes to clinic today with a cc of URI. The patient has a 4 days history of cough, rhinorrhea, chills, and diaphoresis. He is having discomfort with coughing. He has SOB. Denies fevers. Reports sick contacts. States that he has wheezing whenever he gets sick likes this. No LE edema.    Past Medical History  Diagnosis Date  . DM (diabetes mellitus)   . Hypertension   . Erectile dysfunction   . Dyslipidemia   . CAD (coronary artery disease)     stent RCA 2006 (other residual disease). /  nuclear 2008  no ischemia, inferolateral scar.  . Ejection fraction     EF 55%,echo, 2008, inferolateral hypokinesis,  . Sleep apnea    Current Outpatient Prescriptions  Medication Sig Dispense Refill  . benazepril (LOTENSIN) 40 MG tablet Take 1 tablet (40 mg total) by mouth daily.  90 tablet  4  . Blood Glucose Monitoring Suppl (ONE TOUCH ULTRA 2) W/DEVICE KIT Use to check blood sugar sup to 6 times daily Dx code 250.00  1 each  0  . carvedilol (COREG) 6.25 MG tablet Take 1 tablet (6.25 mg total) by mouth 2 (two) times daily.  120 tablet  3  . Flaxseed, Linseed, (FLAX SEED OIL PO) Take by mouth daily.        Marland Kitchen glucose blood (ONE TOUCH ULTRA TEST) test strip 1 each by Other route 6 (six) times daily.  200 each  11  . hydrochlorothiazide (HYDRODIURIL) 25 MG tablet Take 1 tablet (25 mg total) by mouth daily.  60 tablet  5  . insulin detemir (LEVEMIR) 100 UNIT/ML injection Inject 0.6 mLs (60 Units total) into the skin at bedtime.  20 mL  12  . insulin regular (HUMULIN R) 100 units/mL injection Take 10-30 units per sliding scale with each meal.  30 mL  4  . Insulin Syringe-Needle U-100 (INSULIN SYRINGE .5CC/31GX5/16") 31G X 5/16" 0.5 ML MISC Use to inject insulin 3 times a day Dx code 250.00  100 each  12  . latanoprost (XALATAN) 0.005 %  ophthalmic solution Place 1 drop into both eyes at bedtime.      Letta Pate DELICA LANCETS MISC Use to check blood sugar sup to 6 times daily. Dx code 250.00  200 each  12  . pravastatin (PRAVACHOL) 40 MG tablet Take 1 tablet (40 mg total) by mouth daily.  90 tablet  4  . sildenafil (VIAGRA) 100 MG tablet Take 0.5 tablets (50 mg total) by mouth as needed for erectile dysfunction.  20 tablet  1  . levocetirizine (XYZAL) 5 MG tablet Take 1 tablet (5 mg total) by mouth every evening.  30 tablet  2  . meclizine (ANTIVERT) 25 MG tablet Take 1 tablet (25 mg total) by mouth 4 (four) times daily. Prn dizziness  28 tablet  0  . metoCLOPramide (REGLAN) 5 MG tablet Take 1 tablet (5 mg total) by mouth 4 (four) times daily - after meals and at bedtime.  120 tablet  1  . naproxen (NAPROSYN) 500 MG tablet Take 1 tablet (500 mg total) by mouth 2 (two) times daily with a meal.  60 tablet  3  . repaglinide (PRANDIN) 1 MG tablet Take up to 12 tablets a day, take with lunch and at bedtime as instructed.  360 tablet  11  . vardenafil (LEVITRA) 10 MG tablet Take 1 tablet (10 mg total) by mouth daily as needed for erectile dysfunction.  10 tablet  0   No current facility-administered medications for this visit.   No family history on file. History   Social History  . Marital Status: Single    Spouse Name: N/A    Number of Children: N/A  . 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 Smoking status: Former Games developer  . Smokeless tobacco: None  . Alcohol Use: Yes  . Drug Use: No  . Sexual Activity: Not Currently   Other Topics Concern  . None   Social History Narrative  . None   Review of Systems:  Review of Systems  Constitutional: Positive for chills, malaise/fatigue and diaphoresis. Negative for fever and weight loss.  HENT: Positive for congestion, sore throat and tinnitus. Negative for ear discharge and nosebleeds.    Eyes: Negative for blurred vision, double vision, photophobia and pain.  Respiratory: Positive for cough, sputum production, shortness of breath and wheezing. Negative for hemoptysis and stridor.   Cardiovascular: Negative for chest pain, palpitations, orthopnea, leg swelling and PND.  Gastrointestinal: Negative for nausea, vomiting, abdominal pain, diarrhea and constipation.  Genitourinary: Negative for dysuria and urgency.  Musculoskeletal: Negative for myalgias.  Skin: Negative for itching and rash.  Neurological: Negative for weakness and headaches.     Objective:  Physical Exam: Filed Vitals:   07/06/13 1445  BP: 119/71  Pulse: 111  Temp: 97 F (36.1 C)  TempSrc: Oral  Weight: 218 lb 1.6 oz (98.93 kg)  SpO2: 98%    Physical Exam  Constitutional: He is oriented to person, place, and time. He appears well-developed and well-nourished. No distress.  HENT:  Head: Normocephalic and atraumatic.  Mouth/Throat: Oropharynx is clear and moist. No oropharyngeal exudate.  Eyes: EOM are normal. Pupils are equal, round, and reactive to light.  Cardiovascular: Normal rate, regular rhythm, normal heart sounds and intact distal pulses.  Exam reveals no gallop and no friction rub.   No murmur heard. Pulmonary/Chest: Effort normal and breath sounds normal. No respiratory distress. He has no wheezes. He has no rales.  Cough, non-productive  Abdominal: Soft. Bowel sounds are normal. He exhibits no distension. There is no tenderness.  Musculoskeletal: He exhibits no edema and no tenderness.  Neurological: He is alert and oriented to person, place, and time. No cranial nerve deficit.  Skin: He is not diaphoretic.  Psychiatric: He has a normal mood and affect. His behavior is normal.  Pressured speach.     Assessment & Plan:

## 2013-07-06 NOTE — Patient Instructions (Addendum)
   Take the cough suppressant and use the albuterol inhaler as needed for SOB and cough.  Take 1 tablet of prandin before meals up to four times a day.  Take 1/2 tablet of pravastatin once a day.  Meet with donna to discuss a new insulin regimen.  F/U in 1 month with PCP.

## 2013-07-07 NOTE — Assessment & Plan Note (Signed)
Appears stable. Refilled Viagra

## 2013-07-07 NOTE — Assessment & Plan Note (Signed)
Lab Results  Component Value Date   HGBA1C 9.4 07/06/2013   HGBA1C 10.1 03/12/2013   HGBA1C 8.6 09/25/2012     Assessment: Diabetes control: poor control (HgbA1C >9%) Progress toward A1C goal:  improved Comments: Patient reports continuing to eat poorly. He does not take SSI with his meals. He wants to go back on N and R insulin.  Plan: Medications:  Plan to change insulin regimen pending meeting with CDE. Restarted prandin 2 mg with meals Home glucose monitoring: Frequency: 4 times a day Timing: before meals;before breakfast Instruction/counseling given: reminded to bring blood glucose meter & log to each visit, reminded to bring medications to each visit, discussed the need for weight loss and discussed diet Educational resources provided: brochure Self management tools provided: home glucose logbook Other plans: Referred to CDE, patient has appointment with ophthalmologist

## 2013-07-07 NOTE — Progress Notes (Signed)
I saw and evaluated the patient.  I personally confirmed the key portions of the history and exam documented by Dr. Komanski and I reviewed pertinent patient test results.  The assessment, diagnosis, and plan were formulated together and I agree with the documentation in the resident's note.  

## 2013-07-07 NOTE — Assessment & Plan Note (Signed)
BP Readings from Last 3 Encounters:  07/06/13 119/71  06/08/13 127/63  03/12/13 118/69    Lab Results  Component Value Date   NA 137 03/12/2013   K 3.8 03/12/2013   CREATININE 1.11 03/12/2013    Assessment: Blood pressure control: controlled Progress toward BP goal:  at goal Comments: No changes  Plan: Medications:  continue current medications Educational resources provided: brochure Self management tools provided: home blood pressure logbook Other plans: None

## 2013-07-07 NOTE — Assessment & Plan Note (Signed)
A: Patient appears to have a URI complicated by reactive airway disease. PNA unlikely given physical exam and history.  P: Plan to treat with cough suppressant and albuterol. Instructed patient to return for f/u if symptoms do not improve in the next few days.

## 2013-08-24 ENCOUNTER — Telehealth: Payer: Self-pay | Admitting: *Deleted

## 2013-08-24 NOTE — Telephone Encounter (Signed)
Pt called -  since last PM around 10PM severe pain left groin area and going up left side of abd about 6-7 inches. Hx of hernia. Unable to go to work this AM due to pain. No appt in clinic for today at this time. Suggest to go to ER or to Urgent Care. Stanton KidneyDebra Azhar Knope RN 08/24/13 9:40AM

## 2013-08-31 ENCOUNTER — Telehealth: Payer: Self-pay | Admitting: Dietician

## 2013-08-31 NOTE — Telephone Encounter (Signed)
Discussed reschedule CDE appointment that patient missed. He will call at later time to schedule. He also reports that he will be seeing Dr. Dione BoozeGroat for his eye exam at the end of this month.

## 2013-09-14 ENCOUNTER — Telehealth: Payer: Self-pay | Admitting: Internal Medicine

## 2013-09-14 NOTE — Telephone Encounter (Signed)
Called Dr. Laruth BouchardGroat's office. This patient did not have an appointment on 09/11/2013.  Dr/ Groat's office is not open on Saturday's.  His last office visit was 05/29/2012.

## 2013-09-30 ENCOUNTER — Telehealth: Payer: Self-pay | Admitting: Internal Medicine

## 2013-09-30 NOTE — Telephone Encounter (Signed)
Called Dr Laruth BouchardGroat's office.  Patient is sch for an appointment on 10/15/2013.  Note is being faxed for 11/27/2011 visit.

## 2013-10-20 ENCOUNTER — Encounter: Payer: Self-pay | Admitting: Internal Medicine

## 2013-10-20 ENCOUNTER — Telehealth: Payer: Self-pay | Admitting: *Deleted

## 2013-10-20 ENCOUNTER — Ambulatory Visit (HOSPITAL_COMMUNITY)
Admission: RE | Admit: 2013-10-20 | Discharge: 2013-10-20 | Disposition: A | Discharge status: Home / Self Care | Payer: 59 | Source: Ambulatory Visit | Attending: Internal Medicine | Admitting: Internal Medicine

## 2013-10-20 ENCOUNTER — Ambulatory Visit: Payer: 59 | Admitting: Internal Medicine

## 2013-10-20 ENCOUNTER — Ambulatory Visit (INDEPENDENT_AMBULATORY_CARE_PROVIDER_SITE_OTHER): Payer: 59 | Admitting: Internal Medicine

## 2013-10-20 VITALS — BP 111/67 | HR 84 | Temp 97.0°F | Wt 215.1 lb

## 2013-10-20 DIAGNOSIS — I2510 Atherosclerotic heart disease of native coronary artery without angina pectoris: Secondary | ICD-10-CM

## 2013-10-20 DIAGNOSIS — R079 Chest pain, unspecified: Secondary | ICD-10-CM

## 2013-10-20 DIAGNOSIS — G909 Disorder of the autonomic nervous system, unspecified: Secondary | ICD-10-CM

## 2013-10-20 DIAGNOSIS — J069 Acute upper respiratory infection, unspecified: Secondary | ICD-10-CM

## 2013-10-20 DIAGNOSIS — R059 Cough, unspecified: Secondary | ICD-10-CM | POA: Insufficient documentation

## 2013-10-20 DIAGNOSIS — R05 Cough: Secondary | ICD-10-CM | POA: Insufficient documentation

## 2013-10-20 DIAGNOSIS — E1149 Type 2 diabetes mellitus with other diabetic neurological complication: Secondary | ICD-10-CM

## 2013-10-20 DIAGNOSIS — R6883 Chills (without fever): Secondary | ICD-10-CM | POA: Insufficient documentation

## 2013-10-20 DIAGNOSIS — E1143 Type 2 diabetes mellitus with diabetic autonomic (poly)neuropathy: Secondary | ICD-10-CM

## 2013-10-20 LAB — GLUCOSE, CAPILLARY: Glucose-Capillary: 308 mg/dL — ABNORMAL HIGH (ref 70–99)

## 2013-10-20 LAB — POCT GLYCOSYLATED HEMOGLOBIN (HGB A1C): Hemoglobin A1C: 8.2

## 2013-10-20 MED ORDER — BENZONATATE 100 MG PO CAPS: 100.0000 mg | ORAL_CAPSULE | Freq: Four times a day (QID) | ORAL | Status: DC | PRN

## 2013-10-20 MED ORDER — ALBUTEROL SULFATE HFA 108 (90 BASE) MCG/ACT IN AERS: 2.0000 | INHALATION_SPRAY | Freq: Four times a day (QID) | RESPIRATORY_TRACT | Status: DC | PRN

## 2013-10-20 MED ORDER — ACETAMINOPHEN-CODEINE #3 300-30 MG PO TABS: 1.0000 | ORAL_TABLET | ORAL | Status: DC | PRN

## 2013-10-20 NOTE — Assessment & Plan Note (Signed)
Patients symptoms are consistent with a URI vs flu like illness. He did receive a flu vaccine this year and symptoms have been ongoing for over 3 days, no indication for tamiflu.  A CXR was obtained given patient's multiple co morbidities but was negative for acute cardiopulmonary process. - Tylenol #3 for pain - Tessalon for cough - Albuterol PRN - Given work excuse note.

## 2013-10-20 NOTE — Assessment & Plan Note (Signed)
Lab Results  Component Value Date   HGBA1C 8.2 10/20/2013   HGBA1C 9.4 07/06/2013   HGBA1C 10.1 03/12/2013     Assessment: Diabetes control: fair control Progress toward A1C goal:  improved Comments:   Plan: Medications:  Prandin 2mg , Levemir 60units, Humulin SSI Home glucose monitoring: Frequency: 3 times a day Timing: before breakfast;before lunch;before dinner Instruction/counseling given: reminded to get eye exam, reminded to bring blood glucose meter & log to each visit, reminded to bring medications to each visit, discussed diet and discussed sick day management Educational resources provided:   Self management tools provided:   Other plans: Patient instructed to increase blood glucose monitoring from 2 times a day to 3 times a day and to use his Short acting insulin per SS three times a day.  He was instructed to bring glucometer to next visit with PCP.

## 2013-10-20 NOTE — Progress Notes (Signed)
[facility]   Subjective:   Patient ID: Tyler Wilkinson male   DOB: 04/21/1956 58 y.o.   MRN: [id number]  HPI: Tyler Wilkinson is a 58 y.o. male with a PMH of T2DM uncontrolled, HTN, CAD.  He presents to the [facility] today for an acute visit.  He reports that he has been having a productive cough and left side > right side sharp stabbing pain in his ribs for the last 3-4 days.  He reports this pain is not exertional and not relieved by rest but is worsened by cough.  He does note that he has had intermittent Headaches with a severe one yesterday.  He admits diffuse myalgias and some chills yesterday.  He does report one of his coworkers has been sick recently.  He currently is taking 60units of Levemir each night, and is taking Humulin 10-30 units twice a day with meals.  He reports no hypoglycemic episodes, he does not have his meter with him today.  Past Medical History  Diagnosis Date  . DM (diabetes mellitus)   . Hypertension   . Erectile dysfunction   . Dyslipidemia   . CAD (coronary artery disease)     stent RCA 2006 (other residual disease). /  nuclear 2008  no ischemia, inferolateral scar.  . Ejection fraction     EF 55%,echo, 2008, inferolateral hypokinesis,  . Sleep apnea    Current Outpatient Prescriptions  Medication Sig Dispense Refill  . acetaminophen-codeine (TYLENOL #3) 300-30 MG per tablet Take 1 tablet by mouth every 4 (four) hours as needed for moderate pain.  30 tablet  0  . albuterol (PROVENTIL HFA;VENTOLIN HFA) 108 (90 BASE) MCG/ACT inhaler Inhale 2 puffs into the lungs every 6 (six) hours as needed for wheezing or shortness of breath.  1 Inhaler  2  . benazepril (LOTENSIN) 40 MG tablet Take 1 tablet (40 mg total) by mouth daily.  90 tablet  4  . benzonatate (TESSALON PERLES) 100 MG capsule Take 1 capsule (100 mg total) by mouth every 6 (six) hours as needed for cough.  30 capsule  1  . Blood Glucose Monitoring Suppl (ONE TOUCH ULTRA 2) W/DEVICE KIT Use to  check blood sugar sup to 6 times daily Dx code 250.00  1 each  0  . carvedilol (COREG) 6.25 MG tablet Take 1 tablet (6.25 mg total) by mouth 2 (two) times daily.  120 tablet  3  . Flaxseed, Linseed, (FLAX SEED OIL PO) Take by mouth daily.        Marland Kitchen glucose blood (ONE TOUCH ULTRA TEST) test strip 1 each by Other route 6 (six) times daily.  200 each  11  . hydrochlorothiazide (HYDRODIURIL) 25 MG tablet Take 1 tablet (25 mg total) by mouth daily.  60 tablet  5  . insulin detemir (LEVEMIR) 100 UNIT/ML injection Inject 0.6 mLs (60 Units total) into the skin at bedtime.  20 mL  12  . insulin regular (HUMULIN R) 100 units/mL injection Take 10-30 units per sliding scale with each meal.  30 mL  4  . Insulin Syringe-Needle U-100 (INSULIN SYRINGE .5CC/31GX5/16") 31G X 5/16" 0.5 ML MISC Use to inject insulin 3 times a day Dx code 250.00  100 each  12  . latanoprost (XALATAN) 0.005 % ophthalmic solution Place 1 drop into both eyes at bedtime.      Marland Kitchen levocetirizine (XYZAL) 5 MG tablet Take 1 tablet (5 mg total) by mouth every evening.  30 tablet  2  .  meclizine (ANTIVERT) 25 MG tablet Take 1 tablet (25 mg total) by mouth 4 (four) times daily. Prn dizziness  28 tablet  0  . metoCLOPramide (REGLAN) 5 MG tablet Take 1 tablet (5 mg total) by mouth 4 (four) times daily - after meals and at bedtime.  120 tablet  1  . naproxen (NAPROSYN) 500 MG tablet Take 1 tablet (500 mg total) by mouth 2 (two) times daily with a meal.  60 tablet  3  . ONETOUCH DELICA LANCETS MISC Use to check blood sugar sup to 6 times daily. Dx code 250.00  200 each  12  . pravastatin (PRAVACHOL) 40 MG tablet Take 1 tablet (40 mg total) by mouth daily.  90 tablet  4  . repaglinide (PRANDIN) 2 MG tablet Take up to 4 times a day before meals.  120 tablet  10  . sildenafil (VIAGRA) 100 MG tablet Take 0.5 tablets (50 mg total) by mouth as needed for erectile dysfunction.  20 tablet  1  . vardenafil (LEVITRA) 10 MG tablet Take 1 tablet (10 mg total) by  mouth daily as needed for erectile dysfunction.  10 tablet  0   No current facility-administered medications for this visit.   No family history on file. History   Social History  . Marital Status: Single    Spouse Name: N/A    Number of Children: N/A  . Years of Education: N/A   Occupational History  . 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 Smoking status: Former Research scientist (life sciences)  . Smokeless tobacco: None  . Alcohol Use: Yes  . Drug Use: No  . Sexual Activity: Not Currently   Other Topics Concern  . None   Social History Narrative  . None   Review of Systems: Review of Systems  Constitutional: Positive for chills and malaise/fatigue. Negative for fever.  HENT: Negative for congestion and sore throat.   Eyes: Negative for blurred vision.  Respiratory: Positive for cough and sputum production (yellow). Negative for shortness of breath and wheezing.   Cardiovascular: Positive for chest pain (left lower ribs, right lower ribs). Negative for claudication and leg swelling.  Gastrointestinal: Positive for vomiting (due to coughing episode) and diarrhea. Negative for nausea, abdominal pain, constipation, blood in stool and melena.  Genitourinary: Negative for dysuria and frequency.  Musculoskeletal: Positive for myalgias. Negative for falls.  Skin: Negative for rash.  Neurological: Positive for weakness and headaches. Negative for dizziness and loss of consciousness.  Endo/Heme/Allergies: Negative for polydipsia.    Objective:  Physical Exam: Filed Vitals:   10/20/13 1427  BP: 111/67  Pulse: 84  Temp: 97 F (36.1 C)  TempSrc: Oral  Weight: 215 lb 1.6 oz (97.569 kg)  SpO2: 95%  Physical Exam  Nursing note and vitals reviewed. Constitutional: He is oriented to person, place, and time and well-developed, well-nourished, and in no distress. No distress.  HENT:  Head: Normocephalic and atraumatic.  Eyes: EOM are normal. Pupils are equal,  round, and reactive to light.  Neck: Normal range of motion. Neck supple.  Cardiovascular: Normal rate, regular rhythm, normal heart sounds and intact distal pulses.   No murmur heard. Pulmonary/Chest: Effort normal and breath sounds normal. No respiratory distress. He has no wheezes. He has no rales. He exhibits tenderness (mild).  Prolonged inspiratory and expiratory phase  Abdominal: Soft. Bowel sounds are normal. He exhibits no distension. There is no tenderness. There is no rebound.  Musculoskeletal: He exhibits no  edema.  Lymphadenopathy:    He has no cervical adenopathy.  Neurological: He is alert and oriented to person, place, and time.  Skin: Skin is warm and dry. He is not diaphoretic.     Assessment & Plan:   See Problem Based Assessment and Plan Meds ordered this encounter  Medications  . albuterol (PROVENTIL HFA;VENTOLIN HFA) 108 (90 BASE) MCG/ACT inhaler    Sig: Inhale 2 puffs into the lungs every 6 (six) hours as needed for wheezing or shortness of breath.    Dispense:  1 Inhaler    Refill:  2  . acetaminophen-codeine (TYLENOL #3) 300-30 MG per tablet    Sig: Take 1 tablet by mouth every 4 (four) hours as needed for moderate pain.    Dispense:  30 tablet    Refill:  0  . benzonatate (TESSALON PERLES) 100 MG capsule    Sig: Take 1 capsule (100 mg total) by mouth every 6 (six) hours as needed for cough.    Dispense:  30 capsule    Refill:  1    Orders Placed This Encounter  Procedures  . DG Chest 2 View    Standing Status: Future     Number of Occurrences: 1     Standing Expiration Date: 12/21/2014    Order Specific Question:  Reason for Exam (SYMPTOM  OR DIAGNOSIS REQUIRED)    Answer:  left side chest pain, productive cough, subjective chills    Order Specific Question:  Preferred imaging location?    Answer:  [facility]  . Glucose, capillary  . POCT glycosylated hemoglobin (Hb A1C)  . EKG 12-Lead

## 2013-10-20 NOTE — Patient Instructions (Signed)
Take Tylenol #3 1 tablet every 4 hours as needed for pain. Take benzoate caps every 6 hours as needed for cough. Use albuterol inhaler every 6 hours as needed. Please start checking your sugar 3 times a day and use your short acting insulin 3 times a day. Follow up with cardiology for your yearly appointment.

## 2013-10-20 NOTE — Telephone Encounter (Signed)
Pt called - ears hurt and h/a since 10/16/13. Since 10/17/13 lungs and mid back hurting. Has yellow productive cough. Nausea. Sinus congestion. Appetite down - pt is diabetic. Appt made today 1:45PM Dr Mikey BussingHoffman. Stanton KidneyDebra Ashelynn Marks RN 10/20/13 10:45AM

## 2013-10-20 NOTE — Assessment & Plan Note (Signed)
Given patient's history of CAD and left sided chest pain, EKG was obtained that showed no acute changes to suggest ischemia. He has not seen his cardiologist Dr. Myrtis SerKatz in over a year, he was encouraged to call and make a follow up appointment.

## 2013-10-21 NOTE — Progress Notes (Signed)
Case discussed with Dr. Hoffman at the time of the visit.  We reviewed the resident's history and exam and pertinent patient test results.  I agree with the assessment, diagnosis, and plan of care documented in the resident's note. 

## 2013-11-26 ENCOUNTER — Ambulatory Visit: Payer: 59 | Admitting: Internal Medicine

## 2013-12-08 ENCOUNTER — Telehealth: Payer: Self-pay | Admitting: Dietician

## 2013-12-09 NOTE — Telephone Encounter (Addendum)
Patient called yesterday and today to discuss his plans to eat healthier and exercise to try to cut back his Humulin R insulin, number of injections, weight and blood sugars. He is working on cutting back on regular soft drinks and reports recent weight loss and improved blood sugars from changes. He reports blood sugars at 7 Pm of 66 and 70s and fasting of 100- 450 mg/dl. He was frustrated by the fasting of 450 because he could not understand what caused it. Warned him of risk of hypoglycemia (nocturnal and sometimes symptomless) when food, insulin needs , activity and glucosee lowering diabetes medications not coordinated.   He plans to schedule follow up with CDE and doctor soon and if he cannot manage blood sugars with current medications asks that physician consider allowing him to use NPH and Regular insulin like he used to do and feels he was able to get better control. He verbalizes desire to improve his skill of knowing what foods  contain carbs and how much.

## 2013-12-17 ENCOUNTER — Other Ambulatory Visit: Payer: Self-pay | Admitting: *Deleted

## 2013-12-17 DIAGNOSIS — M47814 Spondylosis without myelopathy or radiculopathy, thoracic region: Secondary | ICD-10-CM

## 2013-12-17 DIAGNOSIS — E119 Type 2 diabetes mellitus without complications: Secondary | ICD-10-CM

## 2013-12-17 MED ORDER — ONETOUCH DELICA LANCETS 33G MISC: Status: DC

## 2014-01-11 ENCOUNTER — Other Ambulatory Visit: Payer: Self-pay | Admitting: *Deleted

## 2014-01-11 MED ORDER — LEVOCETIRIZINE DIHYDROCHLORIDE 5 MG PO TABS: 5.0000 mg | ORAL_TABLET | Freq: Every evening | ORAL | Status: DC

## 2014-01-13 ENCOUNTER — Encounter (HOSPITAL_BASED_OUTPATIENT_CLINIC_OR_DEPARTMENT_OTHER): Payer: Self-pay | Admitting: Emergency Medicine

## 2014-01-13 ENCOUNTER — Emergency Department (HOSPITAL_BASED_OUTPATIENT_CLINIC_OR_DEPARTMENT_OTHER): Payer: 59

## 2014-01-13 ENCOUNTER — Emergency Department (HOSPITAL_BASED_OUTPATIENT_CLINIC_OR_DEPARTMENT_OTHER)
Admission: EM | Admit: 2014-01-13 | Discharge: 2014-01-13 | Disposition: A | Discharge status: Home / Self Care | Payer: 59 | Attending: Emergency Medicine | Admitting: Emergency Medicine

## 2014-01-13 DIAGNOSIS — I10 Essential (primary) hypertension: Secondary | ICD-10-CM | POA: Insufficient documentation

## 2014-01-13 DIAGNOSIS — Z79899 Other long term (current) drug therapy: Secondary | ICD-10-CM | POA: Insufficient documentation

## 2014-01-13 DIAGNOSIS — Z9861 Coronary angioplasty status: Secondary | ICD-10-CM | POA: Insufficient documentation

## 2014-01-13 DIAGNOSIS — Z794 Long term (current) use of insulin: Secondary | ICD-10-CM | POA: Insufficient documentation

## 2014-01-13 DIAGNOSIS — K429 Umbilical hernia without obstruction or gangrene: Secondary | ICD-10-CM | POA: Insufficient documentation

## 2014-01-13 DIAGNOSIS — E119 Type 2 diabetes mellitus without complications: Secondary | ICD-10-CM | POA: Insufficient documentation

## 2014-01-13 DIAGNOSIS — R630 Anorexia: Secondary | ICD-10-CM | POA: Insufficient documentation

## 2014-01-13 DIAGNOSIS — N529 Male erectile dysfunction, unspecified: Secondary | ICD-10-CM | POA: Insufficient documentation

## 2014-01-13 DIAGNOSIS — Z87891 Personal history of nicotine dependence: Secondary | ICD-10-CM | POA: Insufficient documentation

## 2014-01-13 DIAGNOSIS — R109 Unspecified abdominal pain: Secondary | ICD-10-CM

## 2014-01-13 DIAGNOSIS — E785 Hyperlipidemia, unspecified: Secondary | ICD-10-CM | POA: Insufficient documentation

## 2014-01-13 DIAGNOSIS — I252 Old myocardial infarction: Secondary | ICD-10-CM | POA: Insufficient documentation

## 2014-01-13 DIAGNOSIS — Z791 Long term (current) use of non-steroidal anti-inflammatories (NSAID): Secondary | ICD-10-CM | POA: Insufficient documentation

## 2014-01-13 DIAGNOSIS — I2510 Atherosclerotic heart disease of native coronary artery without angina pectoris: Secondary | ICD-10-CM | POA: Insufficient documentation

## 2014-01-13 LAB — CBC WITH DIFFERENTIAL/PLATELET
Basophils Absolute: 0 10*3/uL (ref 0.0–0.1)
Basophils Relative: 0 % (ref 0–1)
EOS ABS: 0.3 10*3/uL (ref 0.0–0.7)
Eosinophils Relative: 3 % (ref 0–5)
HEMATOCRIT: 42.5 % (ref 39.0–52.0)
HEMOGLOBIN: 15.5 g/dL (ref 13.0–17.0)
LYMPHS ABS: 2.2 10*3/uL (ref 0.7–4.0)
Lymphocytes Relative: 24 % (ref 12–46)
MCH: 29.8 pg (ref 26.0–34.0)
MCHC: 36.5 g/dL — ABNORMAL HIGH (ref 30.0–36.0)
MCV: 81.6 fL (ref 78.0–100.0)
MONOS PCT: 7 % (ref 3–12)
Monocytes Absolute: 0.6 10*3/uL (ref 0.1–1.0)
NEUTROS PCT: 66 % (ref 43–77)
Neutro Abs: 5.9 10*3/uL (ref 1.7–7.7)
Platelets: 208 10*3/uL (ref 150–400)
RBC: 5.21 MIL/uL (ref 4.22–5.81)
RDW: 12.5 % (ref 11.5–15.5)
WBC: 9 10*3/uL (ref 4.0–10.5)

## 2014-01-13 LAB — COMPREHENSIVE METABOLIC PANEL
ALK PHOS: 93 U/L (ref 39–117)
ALT: 30 U/L (ref 0–53)
AST: 24 U/L (ref 0–37)
Albumin: 4 g/dL (ref 3.5–5.2)
BUN: 15 mg/dL (ref 6–23)
CHLORIDE: 102 meq/L (ref 96–112)
CO2: 24 mEq/L (ref 19–32)
Calcium: 9.7 mg/dL (ref 8.4–10.5)
Creatinine, Ser: 0.9 mg/dL (ref 0.50–1.35)
GFR calc non Af Amer: >90 mL/min (ref >90)
GLUCOSE: 147 mg/dL — AB (ref 70–99)
Potassium: 3.7 mEq/L (ref 3.7–5.3)
Sodium: 139 mEq/L (ref 137–147)
Total Bilirubin: 0.5 mg/dL (ref 0.3–1.2)
Total Protein: 6.9 g/dL (ref 6.0–8.3)

## 2014-01-13 LAB — URINALYSIS, ROUTINE W REFLEX MICROSCOPIC
Bilirubin Urine: NEGATIVE
Glucose, UA: 250 mg/dL — AB
Hgb urine dipstick: NEGATIVE
KETONES UR: NEGATIVE mg/dL
Leukocytes, UA: NEGATIVE
NITRITE: NEGATIVE
Protein, ur: NEGATIVE mg/dL
Specific Gravity, Urine: 1.013 (ref 1.005–1.030)
UROBILINOGEN UA: 0.2 mg/dL (ref 0.0–1.0)
pH: 5 (ref 5.0–8.0)

## 2014-01-13 LAB — I-STAT CG4 LACTIC ACID, ED: Lactic Acid, Venous: 0.75 mmol/L (ref 0.5–2.2)

## 2014-01-13 MED ORDER — MORPHINE SULFATE 4 MG/ML IJ SOLN
6.0000 mg | Freq: Once | INTRAMUSCULAR | Status: AC
  Administered 2014-01-13: 6 mg via INTRAVENOUS
  Filled 2014-01-13: qty 2

## 2014-01-13 NOTE — Discharge Instructions (Signed)
See a physician if you're hernia protrudes and you're unable to reduce it despite lying flat. Return for fevers, worsening abdominal pain or new concerns. Followup closely with your primary physician  If you were given medicines take as directed.  If you are on coumadin or contraceptives realize their levels and effectiveness is altered by many different medicines.  If you have any reaction (rash, tongues swelling, other) to the medicines stop taking and see a physician.   Please follow up as directed and return to the ER or see a physician for new or worsening symptoms.  Thank you. Filed Vitals:   01/13/14 1324  BP: 123/86  Pulse: 72  Temp: 97.8 F (36.6 C)  Resp: 16  Weight: 215 lb (97.523 kg)  SpO2: 100%

## 2014-01-13 NOTE — ED Provider Notes (Signed)
CSN: [id number]     Arrival date & time 01/13/14  1320 History   First MD Initiated Contact with Patient 01/13/14 1344     Chief Complaint  Patient presents with  . Abdominal Pain     (Consider location/radiation/quality/duration/timing/severity/associated sxs/prior Treatment) HPI Comments: 58 year old male with history of diabetes, lipids, ventral hernia, high blood pressure, heart attack presents with intermittent umbilical tenderness. Patient has had a small hernia for a long time however recently has become more painful. He can reduce it however more difficult recently. No fevers or chills. No recent surgeries no bowel obstruction history. No vomiting or diarrhea or blood in the stools  Patient is a 58 y.o. male presenting with abdominal pain. The history is provided by the patient.  Abdominal Pain Associated symptoms: no chest pain, no chills, no dysuria, no fever, no shortness of breath and no vomiting     Past Medical History  Diagnosis Date  . DM (diabetes mellitus)   . Hypertension   . Erectile dysfunction   . Dyslipidemia   . CAD (coronary artery disease)     stent RCA 2006 (other residual disease). /  nuclear 2008  no ischemia, inferolateral scar.  . Ejection fraction     EF 55%,echo, 2008, inferolateral hypokinesis,  . Sleep apnea    Past Surgical History  Procedure Laterality Date  . Coronary stent placement     History reviewed. No pertinent family history. History  Substance Use Topics  . Smoking status: Former Research scientist (life sciences)  . Smokeless tobacco: Not on file  . Alcohol Use: Yes    Review of Systems  Constitutional: Positive for appetite change. Negative for fever and chills.  HENT: Negative for congestion.   Eyes: Negative for visual disturbance.  Respiratory: Negative for shortness of breath.   Cardiovascular: Negative for chest pain.  Gastrointestinal: Positive for abdominal pain. Negative for vomiting.  Genitourinary: Negative for dysuria and flank pain.   Musculoskeletal: Negative for back pain, neck pain and neck stiffness.  Skin: Negative for rash.  Neurological: Negative for light-headedness and headaches.      Allergies  Review of patient's allergies indicates no known allergies.  Home Medications   Prior to Admission medications   Medication Sig Start Date End Date Taking? Authorizing Provider  acetaminophen-codeine (TYLENOL #3) 300-30 MG per tablet Take 1 tablet by mouth every 4 (four) hours as needed for moderate pain. 10/20/13   Joni Reining, DO  albuterol (PROVENTIL HFA;VENTOLIN HFA) 108 (90 BASE) MCG/ACT inhaler Inhale 2 puffs into the lungs every 6 (six) hours as needed for wheezing or shortness of breath. 10/20/13   Joni Reining, DO  benazepril (LOTENSIN) 40 MG tablet Take 1 tablet (40 mg total) by mouth daily. 03/12/13 03/12/14  Marrion Coy, MD  benzonatate (TESSALON PERLES) 100 MG capsule Take 1 capsule (100 mg total) by mouth every 6 (six) hours as needed for cough. 10/20/13 10/20/14  Joni Reining, DO  Blood Glucose Monitoring Suppl (ONE TOUCH ULTRA 2) W/DEVICE KIT Use to check blood sugar sup to 6 times daily Dx code 250.00 02/07/11   Trish Fountain, MD  carvedilol (COREG) 6.25 MG tablet Take 1 tablet (6.25 mg total) by mouth 2 (two) times daily. 03/12/13 03/12/14  Marrion Coy, MD  Flaxseed, Linseed, (FLAX SEED OIL PO) Take by mouth daily.      Historical Provider, MD  glucose blood (ONE TOUCH ULTRA TEST) test strip 1 each by Other route 6 (six) times daily. 03/12/13   Marrion Coy, MD  hydrochlorothiazide (HYDRODIURIL)  25 MG tablet Take 1 tablet (25 mg total) by mouth daily. 03/12/13   Marrion Coy, MD  insulin detemir (LEVEMIR) 100 UNIT/ML injection Inject 0.6 mLs (60 Units total) into the skin at bedtime. 03/01/13   Marrion Coy, MD  insulin regular (HUMULIN R) 100 units/mL injection Take 10-30 units per sliding scale with each meal. 03/12/13   Marrion Coy, MD  Insulin  Syringe-Needle U-100 (INSULIN SYRINGE .5CC/31GX5/16") 31G X 5/16" 0.5 ML MISC Use to inject insulin 3 times a day Dx code 250.00 02/18/11   Trish Fountain, MD  latanoprost (XALATAN) 0.005 % ophthalmic solution Place 1 drop into both eyes at bedtime. 06/01/11   Historical Provider, MD  levocetirizine (XYZAL) 5 MG tablet Take 1 tablet (5 mg total) by mouth every evening. 01/11/14   Marrion Coy, MD  meclizine (ANTIVERT) 25 MG tablet Take 1 tablet (25 mg total) by mouth 4 (four) times daily. Prn dizziness 06/08/13   Janne Napoleon, NP  metoCLOPramide (REGLAN) 5 MG tablet Take 1 tablet (5 mg total) by mouth 4 (four) times daily - after meals and at bedtime. 03/20/12 03/30/12  Trish Fountain, MD  naproxen (NAPROSYN) 500 MG tablet Take 1 tablet (500 mg total) by mouth 2 (two) times daily with a meal. 03/12/13   Marrion Coy, MD  [facility] DELICA LANCETS 95J MISC Use to check blood sugars up to 6 times a day. Dx code: 250.60 12/17/13   Marrion Coy, MD  [facility] DELICA LANCETS MISC Use to check blood sugar sup to 6 times daily. Dx code 250.00 02/07/11   Trish Fountain, MD  pravastatin (PRAVACHOL) 40 MG tablet Take 1 tablet (40 mg total) by mouth daily. 07/06/13 07/06/14  Marrion Coy, MD  repaglinide (PRANDIN) 2 MG tablet Take up to 4 times a day before meals. 07/06/13   Marrion Coy, MD  sildenafil (VIAGRA) 100 MG tablet Take 0.5 tablets (50 mg total) by mouth as needed for erectile dysfunction. 07/06/13 07/06/14  Marrion Coy, MD  vardenafil (LEVITRA) 10 MG tablet Take 1 tablet (10 mg total) by mouth daily as needed for erectile dysfunction. 01/03/12 02/02/12  Trish Fountain, MD   BP 123/86  Pulse 72  Temp(Src) 97.8 F (36.6 C)  Resp 16  Wt 215 lb (97.523 kg)  SpO2 100% Physical Exam  Nursing note and vitals reviewed. Constitutional: He is oriented to person, place, and time. He appears well-developed and well-nourished.  HENT:  Head:  Normocephalic and atraumatic.  Eyes: Conjunctivae are normal. Right eye exhibits no discharge. Left eye exhibits no discharge.  Neck: Normal range of motion. Neck supple. No tracheal deviation present.  Cardiovascular: Normal rate and regular rhythm.   Pulmonary/Chest: Effort normal and breath sounds normal.  Abdominal: Soft. He exhibits no distension. There is tenderness. There is no guarding.  Mild tenderness umbilicus with small ventral hernia that is easily reducible. Obese  Musculoskeletal: He exhibits no edema.  Neurological: He is alert and oriented to person, place, and time.  Skin: Skin is warm. No rash noted.  Psychiatric: He has a normal mood and affect.    ED Course  Procedures (including critical care time) Labs Review Labs Reviewed  CBC WITH DIFFERENTIAL - Abnormal; Notable for the following:    MCHC 36.5 (*)    All other components within normal limits  COMPREHENSIVE METABOLIC PANEL - Abnormal; Notable for the following:    Glucose, Bld 147 (*)    All other components within normal limits  URINALYSIS, ROUTINE W REFLEX MICROSCOPIC - Abnormal; Notable  for the following:    Glucose, UA 250 (*)    All other components within normal limits  I-STAT CG4 LACTIC ACID, ED    Imaging Review Dg Abd Acute W/chest  01/13/2014   CLINICAL DATA:  Abdominal pain, nausea, umbilical hernia  EXAM: ACUTE ABDOMEN SERIES (ABDOMEN 2 VIEW & CHEST 1 VIEW)  COMPARISON:  10/20/2013  FINDINGS: Cardiomediastinal silhouette is stable. No acute infiltrate or pleural effusion. No pulmonary edema. There is nonspecific nonobstructive bowel gas pattern. No free abdominal air. Stool noted in right colon and transverse colon.  IMPRESSION: Negative abdominal radiographs.  No acute cardiopulmonary disease.   Electronically Signed   By: Lahoma Crocker M.D.   On: 01/13/2014 14:54     EKG Interpretation None      MDM   Final diagnoses:  Umbilical hernia  Abdominal pain   Intermittent pain at umbilical  hernia. Reducible in the ER with minimal pressure. Abdominal labs unremarkable, x-ray no acute findings and lactic acid normal. Patient is pain-free on recheck and I discussed close followup with general surgery and reasons to return.  Results and differential diagnosis were discussed with the patient/parent/guardian. Close follow up outpatient was discussed, comfortable with the plan.   Filed Vitals:   01/13/14 1324  BP: 123/86  Pulse: 72  Temp: 97.8 F (36.6 C)  Resp: 16  Weight: 215 lb (97.523 kg)  SpO2: 100%        Mariea Clonts, MD 01/13/14 1536

## 2014-01-13 NOTE — ED Notes (Signed)
Pt c/o abd pain  X 4 days with HX hernia

## 2014-01-13 NOTE — ED Notes (Signed)
Patient transported to X-ray 

## 2014-01-25 ENCOUNTER — Other Ambulatory Visit: Payer: Self-pay | Admitting: Internal Medicine

## 2014-01-26 ENCOUNTER — Encounter: Payer: Self-pay | Admitting: Internal Medicine

## 2014-01-26 ENCOUNTER — Ambulatory Visit (INDEPENDENT_AMBULATORY_CARE_PROVIDER_SITE_OTHER): Payer: 59 | Admitting: Internal Medicine

## 2014-01-26 ENCOUNTER — Ambulatory Visit: Payer: 59 | Admitting: Internal Medicine

## 2014-01-26 ENCOUNTER — Other Ambulatory Visit: Payer: Self-pay | Admitting: *Deleted

## 2014-01-26 VITALS — BP 118/72 | HR 74 | Temp 97.4°F | Resp 20 | Ht 73.0 in | Wt 208.0 lb

## 2014-01-26 DIAGNOSIS — E1149 Type 2 diabetes mellitus with other diabetic neurological complication: Secondary | ICD-10-CM

## 2014-01-26 DIAGNOSIS — E1110 Type 2 diabetes mellitus with ketoacidosis without coma: Secondary | ICD-10-CM

## 2014-01-26 DIAGNOSIS — N529 Male erectile dysfunction, unspecified: Secondary | ICD-10-CM

## 2014-01-26 DIAGNOSIS — E1143 Type 2 diabetes mellitus with diabetic autonomic (poly)neuropathy: Secondary | ICD-10-CM

## 2014-01-26 DIAGNOSIS — I10 Essential (primary) hypertension: Secondary | ICD-10-CM

## 2014-01-26 DIAGNOSIS — G909 Disorder of the autonomic nervous system, unspecified: Secondary | ICD-10-CM

## 2014-01-26 LAB — POCT GLYCOSYLATED HEMOGLOBIN (HGB A1C): Hemoglobin A1C: 8.5

## 2014-01-26 LAB — GLUCOSE, CAPILLARY: Glucose-Capillary: 270 mg/dL — ABNORMAL HIGH (ref 70–99)

## 2014-01-26 MED ORDER — INSULIN REGULAR HUMAN 100 UNIT/ML IJ SOLN: INTRAMUSCULAR | Status: DC

## 2014-01-26 MED ORDER — SILDENAFIL CITRATE 100 MG PO TABS: 50.0000 mg | ORAL_TABLET | ORAL | Status: DC | PRN

## 2014-01-26 MED ORDER — INSULIN DETEMIR 100 UNIT/ML ~~LOC~~ SOLN: 60.0000 [IU] | Freq: Every day | SUBCUTANEOUS | Status: DC

## 2014-01-26 NOTE — Patient Instructions (Signed)
Take all the medications as recommended. 

## 2014-01-26 NOTE — Assessment & Plan Note (Signed)
Well controlled.  Plans: Continue current regimen. 

## 2014-01-26 NOTE — Progress Notes (Signed)
Subjective:   Patient ID: MARLENE PFLUGER male   DOB: 09/16/1955 58 y.o.   MRN: [id number]  HPI: DUSTINE STICKLER is a 58 y.o. gentleman with PMH significant for HTN, DM, CAD comes to the office for the following.  1. Follow up on [name] and to check his A1C.  2. Requesting refills on his insulin and viagra.  Patient was seen in the ED on 5/28 for his ventral hernia, which is reducible and is given a surgery referral. He states that he hasn't called to make an appointment.  He denies any other complaints during this office visit.    Past Medical History  Diagnosis Date  . DM (diabetes mellitus)   . Hypertension   . Erectile dysfunction   . Dyslipidemia   . CAD (coronary artery disease)     stent RCA 2006 (other residual disease). /  nuclear 2008  no ischemia, inferolateral scar.  . Ejection fraction     EF 55%,echo, 2008, inferolateral hypokinesis,  . Sleep apnea    Current Outpatient Prescriptions  Medication Sig Dispense Refill  . sildenafil (VIAGRA) 100 MG tablet Take 0.5 tablets (50 mg total) by mouth as needed for erectile dysfunction.  20 tablet  1  . acetaminophen-codeine (TYLENOL #3) 300-30 MG per tablet Take 1 tablet by mouth every 4 (four) hours as needed for moderate pain.  30 tablet  0  . albuterol (PROVENTIL HFA;VENTOLIN HFA) 108 (90 BASE) MCG/ACT inhaler Inhale 2 puffs into the lungs every 6 (six) hours as needed for wheezing or shortness of breath.  1 Inhaler  2  . benazepril (LOTENSIN) 40 MG tablet Take 1 tablet (40 mg total) by mouth daily.  90 tablet  4  . benzonatate (TESSALON PERLES) 100 MG capsule Take 1 capsule (100 mg total) by mouth every 6 (six) hours as needed for cough.  30 capsule  1  . Blood Glucose Monitoring Suppl (ONE TOUCH ULTRA 2) W/DEVICE KIT Use to check blood sugar sup to 6 times daily Dx code 250.00  1 each  0  . carvedilol (COREG) 6.25 MG tablet Take 1 tablet (6.25 mg total) by mouth 2 (two) times daily.  120 tablet  3  . Flaxseed,  Linseed, (FLAX SEED OIL PO) Take by mouth daily.        Marland Kitchen glucose blood (ONE TOUCH ULTRA TEST) test strip 1 each by Other route 6 (six) times daily.  200 each  11  . hydrochlorothiazide (HYDRODIURIL) 25 MG tablet Take 1 tablet (25 mg total) by mouth daily.  60 tablet  5  . insulin detemir (LEVEMIR) 100 UNIT/ML injection Inject 0.6 mLs (60 Units total) into the skin at bedtime.  20 mL  12  . insulin regular (HUMULIN R) 100 units/mL injection Take 10-30 units per sliding scale with each meal.  30 mL  4  . Insulin Syringe-Needle U-100 (INSULIN SYRINGE .5CC/31GX5/16") 31G X 5/16" 0.5 ML MISC Use to inject insulin 3 times a day Dx code 250.00  100 each  12  . latanoprost (XALATAN) 0.005 % ophthalmic solution Place 1 drop into both eyes at bedtime.      Marland Kitchen levocetirizine (XYZAL) 5 MG tablet Take 1 tablet (5 mg total) by mouth every evening.  30 tablet  2  . meclizine (ANTIVERT) 25 MG tablet Take 1 tablet (25 mg total) by mouth 4 (four) times daily. Prn dizziness  28 tablet  0  . metoCLOPramide (REGLAN) 5 MG tablet Take 1 tablet (5 mg  total) by mouth 4 (four) times daily - after meals and at bedtime.  120 tablet  1  . naproxen (NAPROSYN) 500 MG tablet Take 1 tablet (500 mg total) by mouth 2 (two) times daily with a meal.  60 tablet  3  . ONETOUCH DELICA LANCETS 69G MISC Use to check blood sugars up to 6 times a day. Dx code: 250.60  200 each  6  . ONETOUCH DELICA LANCETS MISC Use to check blood sugar sup to 6 times daily. Dx code 250.00  200 each  12  . pravastatin (PRAVACHOL) 40 MG tablet Take 1 tablet (40 mg total) by mouth daily.  90 tablet  4  . repaglinide (PRANDIN) 2 MG tablet Take up to 4 times a day before meals.  120 tablet  10   No current facility-administered medications for this visit.   No family history on file. History   Social History  . Marital Status: Single    Spouse Name: N/A    Number of Children: N/A  . Years of Education: N/A   Occupational History  . 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 Smoking status: Former Smoker    Quit date: 01/26/1973  . Smokeless tobacco: Not on file  . Alcohol Use: Yes  . Drug Use: No  . Sexual Activity: Not Currently   Other Topics Concern  . Not on file   Social History Narrative  . No narrative on file   Review of Systems: Pertinent items are noted in HPI. Objective:  Physical Exam: Filed Vitals:   01/26/14 1548  BP: 118/72  Pulse: 74  Temp: 97.4 F (36.3 C)  TempSrc: Oral  Resp: 20  Height: _0  (1.854 m)  Weight: 208 lb (94.348 kg)  SpO2: 96%   Constitutional: Vital signs reviewed.  Patient is a well-developed and well-nourished and is in no acute distress and cooperative with exam. Alert and oriented x3.  Eyes: Conjunctivae normal, No scleral icterus.  Neck: Supple, No carotid bruit present.  Cardiovascular: RRR, S1 normal, S2 normal, no MRG. Pulmonary/Chest: normal respiratory effort, CTAB, no wheezes, rales, or rhonchi Abdominal: Soft. Umbilical hernia noticed. Neurological: A&O x3 Skin: Warm, dry and intact.  Psychiatric: Normal mood and affect.   Assessment & Plan:

## 2014-01-26 NOTE — Telephone Encounter (Signed)
States he's out of meds; appt scheduled for today.

## 2014-01-26 NOTE — Assessment & Plan Note (Signed)
Currently not a goal with an A1C of 8.5 today in the office. Patient agrees that he hasn't been watching his diet lately. 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 Because of this unable to provide a good meal time coverage for him at night times.  Plans: Recommended to start checking his blood sugars daily and bring his glucometer to his office visit. Follow up in a month. Continue current regimen.

## 2014-01-27 ENCOUNTER — Other Ambulatory Visit: Payer: Self-pay | Admitting: Internal Medicine

## 2014-01-27 NOTE — Progress Notes (Signed)
Attending physician note: Presenting problems, physical findings, and medications, reviewed with resident physician Dr. Blondell Reveal I concur with his management. Cephas Darby, M.D., FACP

## 2014-02-02 ENCOUNTER — Telehealth: Payer: Self-pay | Admitting: Dietician

## 2014-02-02 NOTE — Telephone Encounter (Addendum)
Patient is interested in trying the V-Go. Says cash price is 300/month and he hopes with his insurance it will be less and give him less injections and better blood sugars.  If you agree, he will need an order to start the V-Go 40  and also one for fast acting insulin 3 vials/month ( 40 units/day is basal rate and you can choose how many units you want him to use with each meal up to a total of 36 units a day: rx could read:  meal 1-2 to 12units, meal 2- 2 to 12 units, meal 3 2 to 12 units) .   It appears that Humalog is preferred on his formulary or he could use  Apidra for which he could use the 0$ copay card. Once he picks these prescriptions up, I can schedule him to come in and be trained on how to use it.

## 2014-02-09 NOTE — Telephone Encounter (Signed)
Dr Glendell DockerKomanski is on vacation this week but will be back 30th and 31st and has his last admin afternoon on the 31st so I would give to him then.

## 2014-02-09 NOTE — Telephone Encounter (Signed)
Patient is anxious to start using V-Go. V- go will cost 30/month is his insurance covers and 80/month is it does not. Patient agrees to this cost. I have not heard back from Dr. Kirtland BouchardK via this and an inbasket message.  Please advise.

## 2014-02-11 ENCOUNTER — Encounter: Payer: Self-pay | Admitting: Physician Assistant

## 2014-02-11 ENCOUNTER — Ambulatory Visit (INDEPENDENT_AMBULATORY_CARE_PROVIDER_SITE_OTHER): Payer: 59 | Admitting: Physician Assistant

## 2014-02-11 VITALS — BP 110/58 | HR 73 | Ht 73.0 in | Wt 210.0 lb

## 2014-02-11 DIAGNOSIS — I2510 Atherosclerotic heart disease of native coronary artery without angina pectoris: Secondary | ICD-10-CM

## 2014-02-11 DIAGNOSIS — E785 Hyperlipidemia, unspecified: Secondary | ICD-10-CM

## 2014-02-11 DIAGNOSIS — R079 Chest pain, unspecified: Secondary | ICD-10-CM

## 2014-02-11 DIAGNOSIS — I10 Essential (primary) hypertension: Secondary | ICD-10-CM

## 2014-02-11 MED ORDER — ASPIRIN EC 81 MG PO TBEC: 81.0000 mg | DELAYED_RELEASE_TABLET | Freq: Every day | ORAL | Status: DC

## 2014-02-11 NOTE — Progress Notes (Signed)
Cardiology Office Note    Date:  02/11/2014   ID:  Tyler Wilkinson, Tyler Wilkinson 30-Apr-1956, MRN [id number]  PCP:  Marrion Coy, MD  Cardiologist:  Dr. Cleatis Polka     History of Present Illness: Tyler Wilkinson is a 58 y.o. male with a hx of CAD, status post MI in 2006 treated with angioplasty and stent to the posterolateral branch of the RCA, HTN, HL, diabetes. Last cardiac catheterization in 05/2011 demonstrated a subtotally occluded stent in the PL branch 1. Aggressive medical therapy was recommended. Last seen by Dr. Ron Parker in 12/2011.    He was recently diagnosed with an umbilical hernia. He has not yet set up an appointment with Gen. Surgery. He has a lot of chest pain. This typically occurs at work when he is lifting heavy objects. However, he can walk up steps or hills without significant chest discomfort. He denies dyspnea. He denies orthopnea, PND or edema. He denies syncope. Overall, his chest discomfort has been fairly stable.   Studies:  - LHC (05/2011):  Proximal OM2 30%, proximal LAD 50%, mid LAD 20%, distal LAD 30%, ostial D1 50%, mid RCA 20%, PL branch 1 stent subtotally occluded  with collaterals from the distal branch, proximal PL branch 2 30% - medical therapy  - Echo (01/2007):  EF 50-55%, inferoposterior hypokinesis  - Nuclear (01/2007):  EF 55%, inferior and inferolateral scar, no ischemia   Recent Labs: 03/12/2013: HDL Cholesterol by NMR 37*; LDL (calc) 86  01/13/2014: ALT 30; Creatinine 0.90; Hemoglobin 15.5; Potassium 3.7   Wt Readings from Last 3 Encounters:  02/11/14 210 lb (95.255 kg)  01/26/14 208 lb (94.348 kg)  01/13/14 215 lb (97.523 kg)     Past Medical History  Diagnosis Date  . DM (diabetes mellitus)   . Hypertension   . Erectile dysfunction   . Dyslipidemia   . CAD (coronary artery disease)     stent RCA 2006 (other residual disease). /  nuclear 2008  no ischemia, inferolateral scar.  . Ejection fraction     EF 55%,echo, 2008, inferolateral  hypokinesis,  . Sleep apnea     Current Outpatient Prescriptions  Medication Sig Dispense Refill  . acetaminophen-codeine (TYLENOL #3) 300-30 MG per tablet Take 1 tablet by mouth every 4 (four) hours as needed for moderate pain.  30 tablet  0  . albuterol (PROVENTIL HFA;VENTOLIN HFA) 108 (90 BASE) MCG/ACT inhaler Inhale 2 puffs into the lungs every 6 (six) hours as needed for wheezing or shortness of breath.  1 Inhaler  2  . benazepril (LOTENSIN) 40 MG tablet Take 1 tablet (40 mg total) by mouth daily.  90 tablet  4  . benzonatate (TESSALON PERLES) 100 MG capsule Take 1 capsule (100 mg total) by mouth every 6 (six) hours as needed for cough.  30 capsule  1  . Blood Glucose Monitoring Suppl (ONE TOUCH ULTRA 2) W/DEVICE KIT Use to check blood sugar sup to 6 times daily Dx code 250.00  1 each  0  . carvedilol (COREG) 6.25 MG tablet Take 1 tablet (6.25 mg total) by mouth 2 (two) times daily.  120 tablet  3  . Flaxseed, Linseed, (FLAX SEED OIL PO) Take by mouth daily.        Marland Kitchen glucose blood (ONE TOUCH ULTRA TEST) test strip 1 each by Other route 6 (six) times daily.  200 each  11  . HUMULIN R 100 UNIT/ML injection USE 10-30 UNITS PER SLIDING SCALE WITH EACH MEAL  30 mL  3  . hydrochlorothiazide (HYDRODIURIL) 25 MG tablet Take 1 tablet (25 mg total) by mouth daily.  60 tablet  5  . insulin detemir (LEVEMIR) 100 UNIT/ML injection Inject 0.6 mLs (60 Units total) into the skin at bedtime.  20 mL  12  . Insulin Syringe-Needle U-100 (INSULIN SYRINGE .5CC/31GX5/16") 31G X 5/16" 0.5 ML MISC Use to inject insulin 3 times a day Dx code 250.00  100 each  12  . latanoprost (XALATAN) 0.005 % ophthalmic solution Place 1 drop into both eyes at bedtime.      Marland Kitchen levocetirizine (XYZAL) 5 MG tablet Take 1 tablet (5 mg total) by mouth every evening.  30 tablet  2  . meclizine (ANTIVERT) 25 MG tablet Take 1 tablet (25 mg total) by mouth 4 (four) times daily. Prn dizziness  28 tablet  0  . metoCLOPramide (REGLAN) 5 MG  tablet Take 1 tablet (5 mg total) by mouth 4 (four) times daily - after meals and at bedtime.  120 tablet  1  . naproxen (NAPROSYN) 500 MG tablet Take 1 tablet (500 mg total) by mouth 2 (two) times daily with a meal.  60 tablet  3  . ONETOUCH DELICA LANCETS 55V MISC Use to check blood sugars up to 6 times a day. Dx code: 250.60  200 each  6  . ONETOUCH DELICA LANCETS MISC Use to check blood sugar sup to 6 times daily. Dx code 250.00  200 each  12  . pravastatin (PRAVACHOL) 40 MG tablet Take 1 tablet (40 mg total) by mouth daily.  90 tablet  4  . repaglinide (PRANDIN) 2 MG tablet Take up to 4 times a day before meals.  120 tablet  10  . sildenafil (VIAGRA) 100 MG tablet Take 0.5 tablets (50 mg total) by mouth as needed for erectile dysfunction.  20 tablet  1   No current facility-administered medications for this visit.    Allergies:   Review of patient's allergies indicates no known allergies.   Social History:  The patient  reports that he quit smoking about 41 years ago. He does not have any smokeless tobacco history on file. He reports that he drinks alcohol. He reports that he does not use illicit drugs.   Family History:  The patient's family history is not on file.   ROS:  Please see the history of present illness.      All other systems reviewed and negative.   PHYSICAL EXAM: VS:  BP 110/58  Pulse 73  Ht _0  (1.854 m)  Wt 210 lb (95.255 kg)  BMI 27.71 kg/m2 Well nourished, well developed, in no acute distress HEENT: normal Neck: no JVD Vascular: No carotid bruits bilaterally Cardiac:  normal S1, S2; RRR; no murmur Lungs:  clear to auscultation bilaterally, no wheezing, rhonchi or rales Abd: soft, nontender, no hepatomegaly Ext: no edema Skin: warm and dry Neuro:  CNs 2-12 intact, no focal abnormalities noted  EKG:  NSR, HR 73, normal axis, no significant change when compared to prior tracings     ASSESSMENT AND PLAN:  1. Chest pain: Somewhat atypical symptoms.  However, he will likely need umbilical hernia surgery at some point in the near future. Arrange ETT-Myoview for risk stratification. 2. CAD: Arrange Myoview as noted. Continue aspirin, beta blocker, statin. 3. Hypertension: Controlled. 4. Hyperlipidemia: Continue statin. Arrange fasting lipids. 5. Disposition: Followup with Dr. Ron Parker as planned.   Signed, Versie Starks, MHS 02/11/2014 12:08 PM  [facility] [place], [place],   [address] Phone: [phone]; Fax: [phone]

## 2014-02-11 NOTE — Patient Instructions (Signed)
Your physician has requested that you have en exercise stress myoview. For further information please visit [email or web address]. Please follow instruction sheet, as given.  Your physician recommends that you return for lab work in: LIPID PANEL YOU CAN HAVE DONE THE SAME DAY YOU HAVE STRESS TEST  KEEP YOUR FOLLOW UP WITH DR. KATZ

## 2014-02-15 ENCOUNTER — Other Ambulatory Visit: Payer: Self-pay | Admitting: Internal Medicine

## 2014-02-15 NOTE — Telephone Encounter (Signed)
I have not started V go before. I am uncomfortable starting a insulin delivery device on a patient on my final day of internship. Please schedule appt with PCP to discuss risk benefits and alternatives to V Go.

## 2014-02-15 NOTE — Telephone Encounter (Signed)
Could not reach patient by phone. Letter mailed informing his that he will have to have an appointment and his new doctor will have to agree to prescribe the V- GO

## 2014-02-15 NOTE — Telephone Encounter (Signed)
Spoke with patient. He understands and agreed to call the office if he has not heard from us regarding an appiontment in 3 weeks.

## 2014-02-15 NOTE — Telephone Encounter (Signed)
I'll ask front office to schedule him with his new PCP as soon as possible. Patient was informed.

## 2014-02-25 ENCOUNTER — Other Ambulatory Visit: Payer: Self-pay | Admitting: Internal Medicine

## 2014-03-25 ENCOUNTER — Ambulatory Visit: Payer: 59 | Admitting: Cardiology

## 2014-04-14 ENCOUNTER — Encounter (HOSPITAL_COMMUNITY): Payer: 59

## 2014-04-14 ENCOUNTER — Other Ambulatory Visit: Payer: 59

## 2014-04-22 ENCOUNTER — Ambulatory Visit: Payer: 59 | Admitting: Cardiology

## 2014-05-10 ENCOUNTER — Telehealth: Payer: Self-pay | Admitting: *Deleted

## 2014-05-10 ENCOUNTER — Encounter (HOSPITAL_BASED_OUTPATIENT_CLINIC_OR_DEPARTMENT_OTHER): Payer: Self-pay | Admitting: Emergency Medicine

## 2014-05-10 ENCOUNTER — Emergency Department (HOSPITAL_BASED_OUTPATIENT_CLINIC_OR_DEPARTMENT_OTHER): Payer: 59

## 2014-05-10 ENCOUNTER — Observation Stay (HOSPITAL_BASED_OUTPATIENT_CLINIC_OR_DEPARTMENT_OTHER)
Admission: EM | Admit: 2014-05-10 | Discharge: 2014-05-11 | Disposition: A | Discharge status: Home / Self Care | Payer: 59 | Attending: Cardiology | Admitting: Cardiology

## 2014-05-10 DIAGNOSIS — I200 Unstable angina: Secondary | ICD-10-CM

## 2014-05-10 DIAGNOSIS — Z794 Long term (current) use of insulin: Secondary | ICD-10-CM | POA: Diagnosis not present

## 2014-05-10 DIAGNOSIS — I10 Essential (primary) hypertension: Secondary | ICD-10-CM

## 2014-05-10 DIAGNOSIS — I2510 Atherosclerotic heart disease of native coronary artery without angina pectoris: Secondary | ICD-10-CM | POA: Diagnosis not present

## 2014-05-10 DIAGNOSIS — E785 Hyperlipidemia, unspecified: Secondary | ICD-10-CM | POA: Diagnosis not present

## 2014-05-10 DIAGNOSIS — E109 Type 1 diabetes mellitus without complications: Secondary | ICD-10-CM | POA: Diagnosis not present

## 2014-05-10 DIAGNOSIS — E1143 Type 2 diabetes mellitus with diabetic autonomic (poly)neuropathy: Secondary | ICD-10-CM | POA: Diagnosis present

## 2014-05-10 DIAGNOSIS — R079 Chest pain, unspecified: Secondary | ICD-10-CM | POA: Diagnosis not present

## 2014-05-10 DIAGNOSIS — K3184 Gastroparesis: Secondary | ICD-10-CM

## 2014-05-10 DIAGNOSIS — N529 Male erectile dysfunction, unspecified: Secondary | ICD-10-CM

## 2014-05-10 HISTORY — DX: Acute myocardial infarction, unspecified: I21.9

## 2014-05-10 HISTORY — DX: Headache, unspecified: R51.9

## 2014-05-10 HISTORY — DX: Unspecified chronic bronchitis: J42

## 2014-05-10 HISTORY — DX: Gastroparesis: K31.84

## 2014-05-10 HISTORY — DX: Headache: R51

## 2014-05-10 LAB — CBC WITH DIFFERENTIAL/PLATELET
BASOS ABS: 0 10*3/uL (ref 0.0–0.1)
BASOS PCT: 0 % (ref 0–1)
EOS PCT: 5 % (ref 0–5)
Eosinophils Absolute: 0.5 10*3/uL (ref 0.0–0.7)
HEMATOCRIT: 39.5 % (ref 39.0–52.0)
Hemoglobin: 14.1 g/dL (ref 13.0–17.0)
Lymphocytes Relative: 21 % (ref 12–46)
Lymphs Abs: 2.1 10*3/uL (ref 0.7–4.0)
MCH: 29.5 pg (ref 26.0–34.0)
MCHC: 35.7 g/dL (ref 30.0–36.0)
MCV: 82.6 fL (ref 78.0–100.0)
MONO ABS: 0.7 10*3/uL (ref 0.1–1.0)
Monocytes Relative: 7 % (ref 3–12)
Neutro Abs: 6.5 10*3/uL (ref 1.7–7.7)
Neutrophils Relative %: 67 % (ref 43–77)
Platelets: 195 10*3/uL (ref 150–400)
RBC: 4.78 MIL/uL (ref 4.22–5.81)
RDW: 12.7 % (ref 11.5–15.5)
WBC: 9.7 10*3/uL (ref 4.0–10.5)

## 2014-05-10 LAB — BASIC METABOLIC PANEL
Anion gap: 15 (ref 5–15)
BUN: 15 mg/dL (ref 6–23)
CO2: 23 meq/L (ref 19–32)
CREATININE: 1.1 mg/dL (ref 0.50–1.35)
Calcium: 9.3 mg/dL (ref 8.4–10.5)
Chloride: 104 mEq/L (ref 96–112)
GFR calc non Af Amer: 72 mL/min — ABNORMAL LOW (ref >90)
GFR, EST AFRICAN AMERICAN: 84 mL/min — AB (ref >90)
Glucose, Bld: 87 mg/dL (ref 70–99)
Potassium: 3.8 mEq/L (ref 3.7–5.3)
Sodium: 142 mEq/L (ref 137–147)

## 2014-05-10 LAB — CBC
HEMATOCRIT: 38.3 % — AB (ref 39.0–52.0)
Hemoglobin: 13.5 g/dL (ref 13.0–17.0)
MCH: 28.7 pg (ref 26.0–34.0)
MCHC: 35.2 g/dL (ref 30.0–36.0)
MCV: 81.3 fL (ref 78.0–100.0)
Platelets: 154 10*3/uL (ref 150–400)
RBC: 4.71 MIL/uL (ref 4.22–5.81)
RDW: 12.4 % (ref 11.5–15.5)
WBC: 7.2 10*3/uL (ref 4.0–10.5)

## 2014-05-10 LAB — TROPONIN I
Troponin I: <0.3 ng/mL (ref <0.30)
Troponin I: <0.3 ng/mL (ref <0.30)

## 2014-05-10 LAB — CBG MONITORING, ED
Glucose-Capillary: 51 mg/dL — ABNORMAL LOW (ref 70–99)
Glucose-Capillary: 138 mg/dL — ABNORMAL HIGH (ref 70–99)

## 2014-05-10 LAB — CREATININE, SERUM
Creatinine, Ser: 0.96 mg/dL (ref 0.50–1.35)
GFR calc non Af Amer: 90 mL/min — ABNORMAL LOW (ref >90)

## 2014-05-10 MED ORDER — CARVEDILOL 6.25 MG PO TABS
6.2500 mg | ORAL_TABLET | Freq: Two times a day (BID) | ORAL | Status: DC
  Filled 2014-05-10 (×3): qty 1

## 2014-05-10 MED ORDER — NITROGLYCERIN 0.4 MG SL SUBL: 0.4000 mg | SUBLINGUAL_TABLET | SUBLINGUAL | Status: DC | PRN

## 2014-05-10 MED ORDER — ACETAMINOPHEN 325 MG PO TABS: 650.0000 mg | ORAL_TABLET | ORAL | Status: DC | PRN

## 2014-05-10 MED ORDER — ALBUTEROL SULFATE (2.5 MG/3ML) 0.083% IN NEBU: 2.5000 mg | INHALATION_SOLUTION | Freq: Four times a day (QID) | RESPIRATORY_TRACT | Status: DC | PRN

## 2014-05-10 MED ORDER — ASPIRIN 81 MG PO CHEW: 324.0000 mg | CHEWABLE_TABLET | ORAL | Status: DC

## 2014-05-10 MED ORDER — ASPIRIN 81 MG PO CHEW
324.0000 mg | CHEWABLE_TABLET | Freq: Once | ORAL | Status: AC
  Administered 2014-05-10: 324 mg via ORAL
  Filled 2014-05-10: qty 4

## 2014-05-10 MED ORDER — ONDANSETRON HCL 4 MG/2ML IJ SOLN
4.0000 mg | Freq: Four times a day (QID) | INTRAMUSCULAR | Status: DC | PRN
  Administered 2014-05-10: 4 mg via INTRAVENOUS
  Filled 2014-05-10: qty 2

## 2014-05-10 MED ORDER — ASPIRIN 300 MG RE SUPP: 300.0000 mg | RECTAL | Status: DC

## 2014-05-10 MED ORDER — SIMVASTATIN 20 MG PO TABS
20.0000 mg | ORAL_TABLET | Freq: Every day | ORAL | Status: DC
  Filled 2014-05-10: qty 1

## 2014-05-10 MED ORDER — ONDANSETRON HCL 4 MG/2ML IJ SOLN
4.0000 mg | Freq: Once | INTRAMUSCULAR | Status: AC
  Administered 2014-05-10: 4 mg via INTRAVENOUS
  Filled 2014-05-10: qty 2

## 2014-05-10 MED ORDER — BENAZEPRIL HCL 40 MG PO TABS
40.0000 mg | ORAL_TABLET | Freq: Every day | ORAL | Status: DC
  Administered 2014-05-10 – 2014-05-11 (×2): 40 mg via ORAL
  Filled 2014-05-10 (×2): qty 1

## 2014-05-10 MED ORDER — ASPIRIN EC 81 MG PO TBEC: 81.0000 mg | DELAYED_RELEASE_TABLET | Freq: Every day | ORAL | Status: DC

## 2014-05-10 MED ORDER — NITROGLYCERIN 0.4 MG SL SUBL
0.4000 mg | SUBLINGUAL_TABLET | SUBLINGUAL | Status: DC | PRN
  Administered 2014-05-10: 0.4 mg via SUBLINGUAL
  Filled 2014-05-10: qty 1

## 2014-05-10 MED ORDER — HEPARIN SODIUM (PORCINE) 5000 UNIT/ML IJ SOLN
5000.0000 [IU] | Freq: Three times a day (TID) | INTRAMUSCULAR | Status: DC
  Administered 2014-05-10 – 2014-05-11 (×2): 5000 [IU] via SUBCUTANEOUS
  Filled 2014-05-10 (×5): qty 1

## 2014-05-10 MED ORDER — METOCLOPRAMIDE HCL 5 MG PO TABS
5.0000 mg | ORAL_TABLET | Freq: Three times a day (TID) | ORAL | Status: DC
  Administered 2014-05-10: 5 mg via ORAL
  Filled 2014-05-10 (×6): qty 1

## 2014-05-10 MED ORDER — DEXTROSE 50 % IV SOLN
1.0000 | Freq: Once | INTRAVENOUS | Status: AC
  Administered 2014-05-10: 50 mL via INTRAVENOUS
  Filled 2014-05-10: qty 50

## 2014-05-10 MED ORDER — ALBUTEROL SULFATE HFA 108 (90 BASE) MCG/ACT IN AERS: 2.0000 | INHALATION_SPRAY | Freq: Four times a day (QID) | RESPIRATORY_TRACT | Status: DC | PRN

## 2014-05-10 MED ORDER — ASPIRIN EC 81 MG PO TBEC
81.0000 mg | DELAYED_RELEASE_TABLET | Freq: Every day | ORAL | Status: DC
  Administered 2014-05-11: 81 mg via ORAL
  Filled 2014-05-10: qty 1

## 2014-05-10 NOTE — Telephone Encounter (Signed)
Thank you Tyler Wilkinson. I agree that he needs to be evaluated in the ED given likelihood of ACS or angina

## 2014-05-10 NOTE — ED Provider Notes (Signed)
CSN: [id number]     Arrival date & time 05/10/14  1450 History   First MD Initiated Contact with Patient 05/10/14 [phone]     Chief Complaint  Patient presents with  . Chest Pain     (Consider location/radiation/quality/duration/timing/severity/associated sxs/prior Treatment) Patient is a 58 y.o. male presenting with chest pain.  Chest Pain Pain location:  Substernal area Pain quality: aching and pressure   Pain radiates to:  Does not radiate Pain radiates to the back: yes   Pain severity:  Moderate Onset quality:  Gradual Duration:  2 weeks Timing:  Constant Progression:  Worsening Chronicity:  New Context comment:  During exertion, similar to prior MI Relieved by:  Rest Worsened by:  Exertion Ineffective treatments:  None tried Associated symptoms: abdominal pain, back pain, cough and nausea   Associated symptoms: no dizziness, no fever, no headache, no lower extremity edema, no shortness of breath and not vomiting     Past Medical History  Diagnosis Date  . Hypertension   . Erectile dysfunction   . Dyslipidemia   . CAD (coronary artery disease)     stent RCA 2006 (other residual disease). /  nuclear 2008  no ischemia, inferolateral scar.  . Ejection fraction     EF 55%,echo, 2008, inferolateral hypokinesis,  . Myocardial infarction 2006  . Chronic bronchitis     "anytime I get sick it goes into bronchitis; probably get it q yr"  . Sleep apnea     "not wearing mask" (05/10/2014)  . Type I diabetes mellitus dx'd 1972  . Daily headache     "just recently" (05/10/2014)  . Gastroparesis    Past Surgical History  Procedure Laterality Date  . Coronary angioplasty with stent placement  ~ 2006    "1"   Family History  Problem Relation Age of Onset  . Hypertension Mother    History  Substance Use Topics  . Smoking status: Former Smoker -- .2 years    Types: Cigarettes    Quit date: 01/26/1973  . Smokeless tobacco: Never Used  . Alcohol Use: Yes     Comment:  05/10/2014 "might drink 2-3 times/yr"    Review of Systems  Constitutional: Negative for fever and chills.  HENT: Negative for congestion, rhinorrhea and sore throat.   Eyes: Negative for photophobia and visual disturbance.  Respiratory: Positive for cough. Negative for shortness of breath.   Cardiovascular: Positive for chest pain. Negative for leg swelling.  Gastrointestinal: Positive for nausea and abdominal pain. Negative for vomiting, diarrhea and constipation.  Endocrine: Negative for polydipsia and polyuria.  Genitourinary: Negative for dysuria and hematuria.  Musculoskeletal: Positive for back pain. Negative for arthralgias.  Skin: Negative for color change and rash.  Neurological: Negative for dizziness, syncope, light-headedness and headaches.  Hematological: Negative for adenopathy. Does not bruise/bleed easily.  All other systems reviewed and are negative.     Allergies  Review of patient's allergies indicates no known allergies.  Home Medications   Prior to Admission medications   Medication Sig Start Date End Date Taking? Authorizing Provider  acetaminophen-codeine (TYLENOL #3) 300-30 MG per tablet Take 1 tablet by mouth every 4 (four) hours as needed for moderate pain. 10/20/13   Lucious Groves, DO  albuterol (PROVENTIL HFA;VENTOLIN HFA) 108 (90 BASE) MCG/ACT inhaler Inhale 2 puffs into the lungs every 6 (six) hours as needed for wheezing or shortness of breath. 10/20/13   Lucious Groves, DO  aspirin EC 81 MG tablet Take 1 tablet (81 mg  total) by mouth daily. 02/11/14   Liliane Shi, PA-C  benazepril (LOTENSIN) 40 MG tablet Take 1 tablet (40 mg total) by mouth daily. 03/12/13 03/12/14  Marrion Coy, MD  benzonatate (TESSALON PERLES) 100 MG capsule Take 1 capsule (100 mg total) by mouth every 6 (six) hours as needed for cough. 10/20/13 10/20/14  Lucious Groves, DO  Blood Glucose Monitoring Suppl (ONE TOUCH ULTRA 2) W/DEVICE KIT Use to check blood sugar sup to 6 times  daily Dx code 250.00 02/07/11   Trish Fountain, MD  carvedilol (COREG) 6.25 MG tablet Take one tablet by mouth twice daily 02/25/14   Sid Falcon, MD  Flaxseed, Linseed, (FLAX SEED OIL PO) Take by mouth daily.      Historical Provider, MD  glucose blood (ONE TOUCH ULTRA TEST) test strip 1 each by Other route 6 (six) times daily. 03/12/13   Marrion Coy, MD  HUMULIN R 100 UNIT/ML injection USE 10-30 UNITS PER SLIDING SCALE WITH [facility] MEAL 01/27/14   Marrion Coy, MD  hydrochlorothiazide (HYDRODIURIL) 25 MG tablet Take 1 tablet (25 mg total) by mouth daily. 03/12/13   Marrion Coy, MD  insulin detemir (LEVEMIR) 100 UNIT/ML injection Inject 0.6 mLs (60 Units total) into the skin at bedtime. 01/26/14   Malena Catholic, MD  Insulin Syringe-Needle U-100 (INSULIN SYRINGE .5CC/31GX5/16") 31G X 5/16" 0.5 ML MISC Use to inject insulin 3 times a day Dx code 250.00 02/18/11   Trish Fountain, MD  latanoprost (XALATAN) 0.005 % ophthalmic solution Place 1 drop into both eyes at bedtime. 06/01/11   Historical Provider, MD  levocetirizine (XYZAL) 5 MG tablet Take 1 tablet (5 mg total) by mouth every evening. 01/11/14   Marrion Coy, MD  meclizine (ANTIVERT) 25 MG tablet Take 1 tablet (25 mg total) by mouth 4 (four) times daily. Prn dizziness 06/08/13   Janne Napoleon, NP  metoCLOPramide (REGLAN) 5 MG tablet Take 1 tablet (5 mg total) by mouth 4 (four) times daily - after meals and at bedtime. 03/20/12 02/11/14  Trish Fountain, MD  naproxen (NAPROSYN) 500 MG tablet Take 1 tablet (500 mg total) by mouth 2 (two) times daily with a meal. 03/12/13   Marrion Coy, MD  [facility] DELICA LANCETS 15A MISC Use to check blood sugars up to 6 times a day. Dx code: 250.60 12/17/13   Marrion Coy, MD  [facility] DELICA LANCETS MISC Use to check blood sugar sup to 6 times daily. Dx code 250.00 02/07/11   Trish Fountain, MD  pravastatin (PRAVACHOL) 40 MG tablet Take 1 tablet (40  mg total) by mouth daily. 07/06/13 07/06/14  Marrion Coy, MD  repaglinide (PRANDIN) 2 MG tablet Take up to 4 times a day before meals. 07/06/13   Marrion Coy, MD  sildenafil (VIAGRA) 100 MG tablet Take 0.5 tablets (50 mg total) by mouth as needed for erectile dysfunction. 01/26/14 01/26/15  Malena Catholic, MD   BP 150/71  Pulse 70  Temp(Src) 97.7 F (36.5 C) (Oral)  Resp 16  SpO2 96% Physical Exam  Vitals reviewed. Constitutional: He is oriented to person, place, and time. He appears well-developed and well-nourished.  HENT:  Head: Normocephalic and atraumatic.  Left Ear: Tympanic membrane and external ear normal.  R otic canal with cerumen impaction  Eyes: Conjunctivae and EOM are normal.  Neck: Normal range of motion. Neck supple.  Cardiovascular: Normal rate, regular rhythm and normal heart sounds.   Pulmonary/Chest: Effort normal and breath sounds normal. No respiratory distress.  Abdominal: He  exhibits no distension. There is no tenderness. There is no rebound and no guarding.  Musculoskeletal: Normal range of motion.  Neurological: He is alert and oriented to person, place, and time.  Skin: Skin is warm and dry.    ED Course  Procedures (including critical care time) Labs Review Labs Reviewed  BASIC METABOLIC PANEL - Abnormal; Notable for the following:    GFR calc non Af Amer 72 (*)    GFR calc Af Amer 84 (*)    All other components within normal limits  CBC - Abnormal; Notable for the following:    HCT 38.3 (*)    All other components within normal limits  CREATININE, SERUM - Abnormal; Notable for the following:    GFR calc non Af Amer 90 (*)    All other components within normal limits  CBG MONITORING, ED - Abnormal; Notable for the following:    Glucose-Capillary 51 (*)    All other components within normal limits  CBG MONITORING, ED - Abnormal; Notable for the following:    Glucose-Capillary 138 (*)    All other components within normal  limits  CBC WITH DIFFERENTIAL  TROPONIN I  TROPONIN I  BASIC METABOLIC PANEL  LIPID PANEL  CBC  PROTIME-INR  HEMOGLOBIN A1C  TROPONIN I  TROPONIN I    Imaging Review Dg Chest 2 View  05/10/2014   CLINICAL DATA:  Chest pain  EXAM: CHEST  2 VIEW  COMPARISON:  Jan 13, 2014  FINDINGS: Lungs are clear. Heart size and pulmonary vascularity are normal. No adenopathy. No pneumothorax. No bone lesions.  IMPRESSION: No edema or consolidation.   Electronically Signed   By: Lowella Grip M.D.   On: 05/10/2014 15:27     EKG Interpretation   Date/Time:  Tuesday May 10 2014 14:57:57 EDT Ventricular Rate:  73 PR Interval:  184 QRS Duration: 92 QT Interval:  368 QTC Calculation: 405 R Axis:   39 Text Interpretation:  Normal sinus rhythm Normal ECG Confirmed by WARD,   DO, KRISTEN ([id number]) on 05/10/2014 3:02:55 PM      MDM   Final diagnoses:  Unstable angina    58 y.o. male with pertinent PMH of CAD sp DES 9 years ago presents with recurrent chest pain similar to prior MI.  Patient states this began 2 weeks ago and has been primarily exertional.  On arrival today vitals signs and physical exam as above.  EKG with NSR without abnormality.  ASA given.  Pt had no chest pain at rest.  Given nature of exertional symptoms which are progressive, spoke with cardiology and agreed to transfer pt (provided there was no extra cost via carelink, which we were told) for unstable angina.    1. Unstable angina         Debby Freiberg, MD 05/11/14 0005

## 2014-05-10 NOTE — ED Notes (Signed)
MD at bedside. 

## 2014-05-10 NOTE — Telephone Encounter (Signed)
Pt called stating Last week he started having chest pain, back pain and abd pain.  He feels like he is going to drop. He has a lot of nausea but no SOB, diaphoresis.  Pain in same area as he had with his last heart attack. Starts in back with radiation to front. He was trying to wait until his appointment on the 29th.with Dr. Myrtis Ser ( Cards ) He is still working but has no energy.  Hx :  MI, CAD, DM  Pt advised to go to ED for evaluation.  Symptoms and Hx are concerning. He states he will go now, [facility] [employer].

## 2014-05-10 NOTE — H&P (Signed)
Chief Complaint: Chest Pain Primary Cardiologist: Dr. Ron Parker  HPI: Tyler Wilkinson is a 58 y.o. male, followed by Dr. Ron Parker, with a hx of CAD, status post MI in 2006 treated with angioplasty and stent to the posterolateral branch of the RCA, HTN, HL, diabetes. Last cardiac catheterization in 05/2011 demonstrated a subtotally occluded stent in the PL branch 1. LV function was normal. Aggressive medical therapy was recommended.   He was last seen by Richardson Dopp, PA-C, in clinic on 02/11/14. At that time he complained of chest pain, although somewhat atypical. It was also mentioned in the office note that he had just been diagnosed with an umbilical hernia. Given his CP and likelihood for needing surgery, he was ordered to undergo evaluation with a NST. However, the patient failed to get this done.   He presented to [place] today with a complaint of chest pain. Described mostly as indigestion, however no particular relation with meals. Symptoms have been intermittent for the last 2 weeks. He denies any exertional chest discomfort and no dyspnea. He also notes diffuse abdominal discomfort and diarrhea for the last several days. Some nausea but no vomiting. He also notes frequent headaches, which also occurred in 2006 around the time of his MI. No dizziness, syncope/near-syncope. He has not tried any medications at home for symptoms. However, he was given sublingual nitroglycerin and at [facility] today which did relieve the symptoms. He reports he has not been fully compliant with his medications. He self discontinued his statin 3 months ago because he believed it was affecting his blood pressure (hypotension??). He also reports that his diabetes has not been well controlled at home.  Initial troponin at [facility] was negative. All other labs were unremarkable as well. Blood pressure is moderately elevated at 150/71. His EKG demonstrates normal sinus rhythm without any ischemic  abnormalities. He has been transferred to [facility] for further care. He is currently chest pain-free.   Past Medical History  Diagnosis Date  . DM (diabetes mellitus)   . Hypertension   . Erectile dysfunction   . Dyslipidemia   . CAD (coronary artery disease)     stent RCA 2006 (other residual disease). /  nuclear 2008  no ischemia, inferolateral scar.  . Ejection fraction     EF 55%,echo, 2008, inferolateral hypokinesis,  . Sleep apnea     Past Surgical History  Procedure Laterality Date  . Coronary stent placement      Family History  Problem Relation Age of Onset  . Hypertension Mother    Social History:  reports that he quit smoking about 41 years ago. He does not have any smokeless tobacco history on file. He reports that he drinks alcohol. He reports that he does not use illicit drugs.  Allergies: No Known Allergies  Medications Prior to Admission  Medication Sig Dispense Refill  . acetaminophen-codeine (TYLENOL #3) 300-30 MG per tablet Take 1 tablet by mouth every 4 (four) hours as needed for moderate pain.  30 tablet  0  . albuterol (PROVENTIL HFA;VENTOLIN HFA) 108 (90 BASE) MCG/ACT inhaler Inhale 2 puffs into the lungs every 6 (six) hours as needed for wheezing or shortness of breath.  1 Inhaler  2  . aspirin EC 81 MG tablet Take 1 tablet (81 mg total) by mouth daily.      . benazepril (LOTENSIN) 40 MG tablet Take 1 tablet (40 mg total) by mouth daily.  90 tablet  4  . benzonatate (TESSALON PERLES) 100 MG capsule Take 1 capsule (100 mg total) by mouth every 6 (six) hours as needed for cough.  30 capsule  1  . Blood Glucose Monitoring Suppl (ONE TOUCH ULTRA 2) W/DEVICE KIT Use to check blood sugar sup to 6 times daily Dx code 250.00  1 each  0  . carvedilol (COREG) 6.25 MG tablet Take one tablet by mouth twice daily  120 tablet  1  . Flaxseed, Linseed, (FLAX SEED OIL PO) Take by mouth daily.        Marland Kitchen glucose blood (ONE TOUCH ULTRA TEST) test strip 1 each by  Other route 6 (six) times daily.  200 each  11  . HUMULIN R 100 UNIT/ML injection USE 10-30 UNITS PER SLIDING SCALE WITH EACH MEAL  30 mL  3  . hydrochlorothiazide (HYDRODIURIL) 25 MG tablet Take 1 tablet (25 mg total) by mouth daily.  60 tablet  5  . insulin detemir (LEVEMIR) 100 UNIT/ML injection Inject 0.6 mLs (60 Units total) into the skin at bedtime.  20 mL  12  . Insulin Syringe-Needle U-100 (INSULIN SYRINGE .5CC/31GX5/16") 31G X 5/16" 0.5 ML MISC Use to inject insulin 3 times a day Dx code 250.00  100 each  12  . latanoprost (XALATAN) 0.005 % ophthalmic solution Place 1 drop into both eyes at bedtime.      Marland Kitchen levocetirizine (XYZAL) 5 MG tablet Take 1 tablet (5 mg total) by mouth every evening.  30 tablet  2  . meclizine (ANTIVERT) 25 MG tablet Take 1 tablet (25 mg total) by mouth 4 (four) times daily. Prn dizziness  28 tablet  0  . metoCLOPramide (REGLAN) 5 MG tablet Take 1 tablet (5 mg total) by mouth 4 (four) times daily - after meals and at bedtime.  120 tablet  1  . naproxen (NAPROSYN) 500 MG tablet Take 1 tablet (500 mg total) by mouth 2 (two) times daily with a meal.  60 tablet  3  . ONETOUCH DELICA LANCETS 79G MISC Use to check blood sugars up to 6 times a day. Dx code: 250.60  200 each  6  . ONETOUCH DELICA LANCETS MISC Use to check blood sugar sup to 6 times daily. Dx code 250.00  200 each  12  . pravastatin (PRAVACHOL) 40 MG tablet Take 1 tablet (40 mg total) by mouth daily.  90 tablet  4  . repaglinide (PRANDIN) 2 MG tablet Take up to 4 times a day before meals.  120 tablet  10  . sildenafil (VIAGRA) 100 MG tablet Take 0.5 tablets (50 mg total) by mouth as needed for erectile dysfunction.  20 tablet  1    Results for orders placed during the hospital encounter of 05/10/14 (from the past 48 hour(s))  CBC WITH DIFFERENTIAL     Status: None   Collection Time    05/10/14  3:09 PM      Result Value Ref Range   WBC 9.7  4.0 - 10.5 K/uL   RBC 4.78  4.22 - 5.81 MIL/uL   Hemoglobin  14.1  13.0 - 17.0 g/dL   HCT 39.5  39.0 - 52.0 %   MCV 82.6  78.0 - 100.0 fL   MCH 29.5  26.0 - 34.0 pg   MCHC 35.7  30.0 - 36.0 g/dL   RDW 12.7  11.5 - 15.5 %   Platelets 195  150 - 400 K/uL   Neutrophils Relative % 67  43 - 77 %  Neutro Abs 6.5  1.7 - 7.7 K/uL   Lymphocytes Relative 21  12 - 46 %   Lymphs Abs 2.1  0.7 - 4.0 K/uL   Monocytes Relative 7  3 - 12 %   Monocytes Absolute 0.7  0.1 - 1.0 K/uL   Eosinophils Relative 5  0 - 5 %   Eosinophils Absolute 0.5  0.0 - 0.7 K/uL   Basophils Relative 0  0 - 1 %   Basophils Absolute 0.0  0.0 - 0.1 K/uL  BASIC METABOLIC PANEL     Status: Abnormal   Collection Time    05/10/14  3:09 PM      Result Value Ref Range   Sodium 142  137 - 147 mEq/L   Potassium 3.8  3.7 - 5.3 mEq/L   Chloride 104  96 - 112 mEq/L   CO2 23  19 - 32 mEq/L   Glucose, Bld 87  70 - 99 mg/dL   BUN 15  6 - 23 mg/dL   Creatinine, Ser 1.10  0.50 - 1.35 mg/dL   Calcium 9.3  8.4 - 10.5 mg/dL   GFR calc non Af Amer 72 (*) >90 mL/min   GFR calc Af Amer 84 (*) >90 mL/min   Comment: (NOTE)     The eGFR has been calculated using the CKD EPI equation.     This calculation has not been validated in all clinical situations.     eGFR's persistently <90 mL/min signify possible Chronic Kidney     Disease.   Anion gap 15  5 - 15  TROPONIN I     Status: None   Collection Time    05/10/14  3:09 PM      Result Value Ref Range   Troponin I <0.30  <0.30 ng/mL   Comment:            Due to the release kinetics of cTnI,     a negative result within the first hours     of the onset of symptoms does not rule out     myocardial infarction with certainty.     If myocardial infarction is still suspected,     repeat the test at appropriate intervals.  CBG MONITORING, ED     Status: Abnormal   Collection Time    05/10/14  5:23 PM      Result Value Ref Range   Glucose-Capillary 51 (*) 70 - 99 mg/dL  CBG MONITORING, ED     Status: Abnormal   Collection Time    05/10/14  6:08 PM       Result Value Ref Range   Glucose-Capillary 138 (*) 70 - 99 mg/dL   Dg Chest 2 View  05/10/2014   CLINICAL DATA:  Chest pain  EXAM: CHEST  2 VIEW  COMPARISON:  Jan 13, 2014  FINDINGS: Lungs are clear. Heart size and pulmonary vascularity are normal. No adenopathy. No pneumothorax. No bone lesions.  IMPRESSION: No edema or consolidation.   Electronically Signed   By: Lowella Grip M.D.   On: 05/10/2014 15:27    Review of Systems  Constitutional: Negative for malaise/fatigue.  Respiratory: Negative for shortness of breath.   Cardiovascular: Positive for chest pain. Negative for leg swelling.  Gastrointestinal: Positive for nausea, abdominal pain and diarrhea. Negative for vomiting.  Neurological: Negative for dizziness and loss of consciousness.  All other systems reviewed and are negative.   Blood pressure 150/71, pulse 70, temperature 97.7 F (36.5 C), temperature source  Oral, resp. rate 16, SpO2 96.00%. Physical Exam  Constitutional: He is oriented to person, place, and time. He appears well-developed and well-nourished. No distress.  Neck: No JVD present. Carotid bruit is not present.  Cardiovascular: Normal rate, regular rhythm and intact distal pulses.  Exam reveals friction rub. Exam reveals no gallop.   No murmur heard. Respiratory: Effort normal and breath sounds normal. No respiratory distress. He has no wheezes.  GI: Soft. Bowel sounds are normal. He exhibits no distension and no mass. There is tenderness (Mild and diffuse). There is no rebound and no guarding.  Musculoskeletal: He exhibits no edema.  Neurological: He is alert and oriented to person, place, and time.  Skin: Skin is warm and dry. He is not diaphoretic.  Psychiatric: He has a normal mood and affect. His behavior is normal.     Assessment/Plan Active Problems:   Chest pain with moderate risk for cardiac etiology   HTN (hypertension)   HLD (hyperlipidemia)   DM (diabetes mellitus)   1. Chest Pain:  H/o CAD with prior MI and stenting of his PL branch of the RCA in 2006. Repeat LHC in 2012 revealed subtotal occlusion of the stent. Has been treated with medical therapy since then, but self discontinued his statin therapy 3 months ago. He also reports poorly controlled diabetes. He has had a mixed of both typical/atypical symptoms. He is currently chest pain-free. EKG demonstrates normal sinus rhythm and initial troponin is negative. Given his known history of CAD, we'll continue to cycle cardiac enzymes. If elevated, start IV heparin and plan for left heart catheterization in the a.m. However, if cardiac enzymes remain negative, will plan for nuclear stress testing in the a.m. for risk stratification. Will make n.p.o. at midnight.  2. Hypertension: Moderately elevated. Will continue home meds.  3. Hyperlipidemia: Patient has been off statin medication for 3 months. We'll check a fasting lipid panel in a.m.  4. Diabetes: Blood glucose level appears well controlled. However we'll check a hemoglobin A1c to see how well his diabetes has been controlled over the past 3 months.  5. Abdominal discomfort: Physical exam is notable for diffuse tenderness with palpation, with increased tenderness in the epigastric region. He reports that he is a social drinker. Will check amylase and lipase.   SIMMONS, BRITTAINY 05/10/2014, 8:07 PM  Patient examined.  He is currently pain free.  He is mildly tender in the epigastric region.  I agree with assessment and plan by Lyda Jester PA as noted above.  Lungs are clear.  I do not hear a friction rub at this time.  EKG does not show any ST changes ischemia or pericarditis.

## 2014-05-10 NOTE — ED Notes (Signed)
Pt c/o abd pain and CP intermittent x 1 week-also c/o HA-pt with continuous conversation with staff-NAD

## 2014-05-10 NOTE — ED Notes (Signed)
EDP states that pt wants to go via POV vs ambulance due to finances-case discussed with charge and director-pt's option is [facility] or [facility] due to [facility] is no charge

## 2014-05-10 NOTE — ED Notes (Signed)
Patient here with chest pain that has gotten worse that past few days, reports headache as well. To have stress test on 9/29, same feeling as previous MI

## 2014-05-10 NOTE — ED Notes (Signed)
Pt reports chest pain since September 11- ekg done in triage and given to Dr Elesa Massed to review

## 2014-05-10 NOTE — ED Notes (Signed)
Pt states he feel BS is low

## 2014-05-11 ENCOUNTER — Observation Stay (HOSPITAL_COMMUNITY): Payer: 59

## 2014-05-11 DIAGNOSIS — R079 Chest pain, unspecified: Secondary | ICD-10-CM

## 2014-05-11 DIAGNOSIS — G909 Disorder of the autonomic nervous system, unspecified: Secondary | ICD-10-CM

## 2014-05-11 DIAGNOSIS — E785 Hyperlipidemia, unspecified: Secondary | ICD-10-CM

## 2014-05-11 DIAGNOSIS — K3184 Gastroparesis: Secondary | ICD-10-CM

## 2014-05-11 DIAGNOSIS — E1149 Type 2 diabetes mellitus with other diabetic neurological complication: Secondary | ICD-10-CM

## 2014-05-11 DIAGNOSIS — I10 Essential (primary) hypertension: Secondary | ICD-10-CM

## 2014-05-11 LAB — TROPONIN I

## 2014-05-11 LAB — GLUCOSE, CAPILLARY: GLUCOSE-CAPILLARY: 188 mg/dL — AB (ref 70–99)

## 2014-05-11 LAB — LIPID PANEL
Cholesterol: 130 mg/dL (ref 0–200)
HDL: 38 mg/dL — ABNORMAL LOW (ref >39)
LDL CALC: 70 mg/dL (ref 0–99)
Total CHOL/HDL Ratio: 3.4 RATIO
Triglycerides: 108 mg/dL (ref <150)
VLDL: 22 mg/dL (ref 0–40)

## 2014-05-11 LAB — BASIC METABOLIC PANEL
Anion gap: 12 (ref 5–15)
BUN: 14 mg/dL (ref 6–23)
CALCIUM: 8.7 mg/dL (ref 8.4–10.5)
CHLORIDE: 103 meq/L (ref 96–112)
CO2: 26 mEq/L (ref 19–32)
Creatinine, Ser: 0.95 mg/dL (ref 0.50–1.35)
GFR calc Af Amer: >90 mL/min (ref >90)
GFR calc non Af Amer: >90 mL/min (ref >90)
GLUCOSE: 164 mg/dL — AB (ref 70–99)
Potassium: 3.4 mEq/L — ABNORMAL LOW (ref 3.7–5.3)
SODIUM: 141 meq/L (ref 137–147)

## 2014-05-11 LAB — CBC
HEMATOCRIT: 39.8 % (ref 39.0–52.0)
HEMOGLOBIN: 14.3 g/dL (ref 13.0–17.0)
MCH: 29.3 pg (ref 26.0–34.0)
MCHC: 35.9 g/dL (ref 30.0–36.0)
MCV: 81.6 fL (ref 78.0–100.0)
Platelets: 157 10*3/uL (ref 150–400)
RBC: 4.88 MIL/uL (ref 4.22–5.81)
RDW: 12.5 % (ref 11.5–15.5)
WBC: 7.7 10*3/uL (ref 4.0–10.5)

## 2014-05-11 LAB — PROTIME-INR
INR: 1.03 (ref 0.00–1.49)
Prothrombin Time: 13.5 seconds (ref 11.6–15.2)

## 2014-05-11 LAB — HEMOGLOBIN A1C
Hgb A1c MFr Bld: 8.4 % — ABNORMAL HIGH (ref <5.7)
Mean Plasma Glucose: 194 mg/dL — ABNORMAL HIGH (ref <117)

## 2014-05-11 MED ORDER — TECHNETIUM TC 99M SESTAMIBI GENERIC - CARDIOLITE
10.0000 | Freq: Once | INTRAVENOUS | Status: AC | PRN
  Administered 2014-05-11: 10 via INTRAVENOUS

## 2014-05-11 MED ORDER — TECHNETIUM TC 99M SESTAMIBI GENERIC - CARDIOLITE: 10.0000 | Freq: Once | INTRAVENOUS | Status: DC | PRN

## 2014-05-11 MED ORDER — NITROGLYCERIN 0.4 MG SL SUBL: 0.4000 mg | SUBLINGUAL_TABLET | SUBLINGUAL | Status: DC | PRN

## 2014-05-11 MED ORDER — TECHNETIUM TC 99M SESTAMIBI GENERIC - CARDIOLITE
30.0000 | Freq: Once | INTRAVENOUS | Status: AC | PRN
  Administered 2014-05-11: 30 via INTRAVENOUS

## 2014-05-11 MED ORDER — SILDENAFIL CITRATE 100 MG PO TABS: 50.0000 mg | ORAL_TABLET | ORAL | Status: DC | PRN

## 2014-05-11 NOTE — Progress Notes (Signed)
GXT Myoview done. Images pending.  Corine Shelter PA-C 05/11/2014 10:22 AM

## 2014-05-11 NOTE — Discharge Summary (Signed)
Patient seen and examined and history reviewed. Agree with above findings and plan. See earlier rounding note.  Jaking Thayer [place], MDFACC 05/11/2014 7:39 PM

## 2014-05-11 NOTE — Progress Notes (Signed)
Pt requests we check his blood sugar as he is diabetic and would like to know what it is before the stress test - CBG done and it is 188 - he says he thinks he can do the treadmill as he is active and walks occasionally.

## 2014-05-11 NOTE — Progress Notes (Signed)
Inpatient Diabetes Program Recommendations  AACE/ADA: New Consensus Statement on Inpatient Glycemic Control (2013)  Target Ranges:  Prepandial:   less than 140 mg/dL      Peak postprandial:   less than 180 mg/dL (1-2 hours)      Critically ill patients:  140 - 180 mg/dL     Admitted with CP.  History of DM, CAD, MI.   Home DM Meds:  Levemir 60 units QHS  Regular Insulin 10-30 units tid per SSI Prandin 2 mg QID with meals   Current Insulin Orders: None   **Note plans for either cardiac cath or nuclear stress test today.    MD- Please start Novolog Moderate SSI tid ac + HS  If fasting glucose levels become elevated, may want to start a portion of patient's home Levemir- Could start with 25% of patient's home dose and titrate upward as needed     Will follow Tyler Finland RN, MSN, CDE Diabetes Coordinator Inpatient Diabetes Program Team Pager: [phone] (8a-10p)

## 2014-05-11 NOTE — Progress Notes (Signed)
UR completed 

## 2014-05-11 NOTE — Progress Notes (Signed)
    Subjective:  No chest pain overnight  Objective:  Vital Signs in the last 24 hours: Temp:  [97.7 F (36.5 C)-98.2 F (36.8 C)] 98.2 F (36.8 C) (09/23 0543) Pulse Rate:  [64-74] 69 (09/23 0543) Resp:  [16-18] 18 (09/23 0543) BP: (122-185)/(45-108) 133/72 mmHg (09/23 1025) SpO2:  [96 %-99 %] 98 % (09/23 0543)  Intake/Output from previous day: No intake or output data in the 24 hours ending 05/11/14 1131  Physical Exam: General appearance: alert, cooperative and no distress Lungs: clear to auscultation bilaterally Heart: regular rate and rhythm   Rate: 70  Rhythm: normal sinus rhythm  Lab Results:  Recent Labs  05/10/14 2142 05/11/14 0252  WBC 7.2 7.7  HGB 13.5 14.3  PLT 154 157    Recent Labs  05/10/14 1509 05/10/14 2142 05/11/14 0252  NA 142  --  141  K 3.8  --  3.4*  CL 104  --  103  CO2 23  --  26  GLUCOSE 87  --  164*  BUN 15  --  14  CREATININE 1.10 0.96 0.95    Recent Labs  05/10/14 2142 05/11/14 0252  TROPONINI <0.30 <0.30    Recent Labs  05/11/14 0252  INR 1.03    Imaging: Imaging results have been reviewed  Cardiac Studies:  Assessment/Plan:   Principal Problem:   Chest pain with moderate risk of acute coronary syndrome Active Problems:   CAD RCA stent '06, last cath Oct 2012- med Rx   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 Agree with above findings and plan. Nuclear study noted below was normal. Will plan DC today and focus on risk factor modification. Continue ACEi and Coreg. ASA. Would DC on Crestor. Did not tolerate lipitor in the past. Needs to follow up with primary MD for management of DM.  MYOCARDIAL IMAGING WITH SPECT (REST AND EXERCISE)  GATED LEFT VENTRICULAR WALL MOTION STUDY  LEFT  VENTRICULAR EJECTION FRACTION  TECHNIQUE: Standard myocardial SPECT imaging was performed after resting intravenous injection of 10 mCi Tc-16m sestamibi. Subsequently, exercise tolerance test was performed by the patient under the supervision of the Cardiology staff. At peak-stress, 30 mCi Tc-55m sestamibi was injected intravenously and standard myocardial SPECT imaging was performed. Quantitative gated imaging was also performed to evaluate left ventricular wall motion, and estimate left ventricular ejection fraction.  COMPARISON: Prior examinations 02/12/2007.  FINDINGS: Perfusion: No decreased activity in the left ventricle on stress imaging to suggest reversible ischemia or infarction.  Wall Motion: Normal left ventricular wall motion. No left ventricular dilation.  Left Ventricular Ejection Fraction: 61 %  End diastolic volume 104 ml  End systolic volume 41 ml  IMPRESSION: 1. No reversible ischemia or infarction.  2. Normal left ventricular wall motion.  3. Left ventricular ejection fraction 61%  4. Low-risk stress test findings*.  *2012 Appropriate Use Criteria for Coronary Revascularization Focused Update: J Am Coll Cardiol. 2012;59(9):857-881. http://content.dementiazones.com.aspx?articleid=1201161   Electronically Signed By: Roxy Horseman M.D. On: 05/11/2014 11:37     Peter [place], MDFACC 05/11/2014 12:51 PM

## 2014-05-11 NOTE — Discharge Summary (Signed)
Patient ID: Tyler Wilkinson,  MRN: [id number], DOB/AGE: Oct 08, 1955 58 y.o.  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 male, followed by Dr. Ron Parker, with a hx of CAD, status post MI in 2006 treated with angioplasty and stent to the posterolateral branch of the RCA. Last cardiac catheterization in 05/2011 demonstrated a subtotally occluded stent in the PL branch 1. LV function was normal. Aggressive medical therapy was recommended. Other medica problems in clude  HTN, HL, and diabetes with neuropathy and gastropaersis.  He had been seen in June with chest pain and was scheduled to have a Myoview as an OP next week. He presented to [place] 05/10/14 with chest pain. He was admitted and transferred to [facility]. His Troponin was negative for MI. He had an exercise Myoview 05/10/14. He was able to go 9 minutes of the Bruce protocol. His scan was negative for ischemia. Dr Martinique feels he can be discharged later on the 23d. His medications are unchanged except for PRN NTG. He knows not to use this if he has taken Viagra. He'll follow up with Dr Ron Parker in 3-4 weeks.    Discharge Vitals:  Blood pressure 133/72, pulse 69, temperature 98.2 F (36.8 C), temperature source Oral, resp. rate 18, weight 209 lb (94.802 kg), SpO2 98.00%.    Labs: Results for orders placed during the hospital encounter of 05/10/14 (from the past 24 hour(s))  CBC WITH DIFFERENTIAL     Status: None   Collection Time    05/10/14  3:09 PM      Result Value Ref Range   WBC 9.7  4.0 -  10.5 K/uL   RBC 4.78  4.22 - 5.81 MIL/uL   Hemoglobin 14.1  13.0 - 17.0 g/dL   HCT 39.5  39.0 - 52.0 %   MCV 82.6  78.0 - 100.0 fL   MCH 29.5  26.0 - 34.0 pg   MCHC 35.7  30.0 - 36.0 g/dL   RDW 12.7  11.5 - 15.5 %   Platelets 195  150 - 400 K/uL   Neutrophils Relative % 67  43 - 77 %   Neutro Abs 6.5  1.7 - 7.7 K/uL   Lymphocytes Relative 21  12 - 46 %   Lymphs Abs 2.1  0.7 - 4.0 K/uL   Monocytes Relative 7  3 - 12 %   Monocytes Absolute 0.7  0.1 - 1.0 K/uL   Eosinophils Relative 5  0 - 5 %   Eosinophils Absolute 0.5  0.0 - 0.7 K/uL   Basophils Relative 0  0 - 1 %   Basophils Absolute 0.0  0.0 - 0.1 K/uL  BASIC METABOLIC PANEL     Status: Abnormal   Collection Time    05/10/14  3:09 PM      Result Value Ref Range   Sodium 142  137 - 147 mEq/L   Potassium 3.8  3.7 - 5.3 mEq/L   Chloride 104  96 - 112 mEq/L   CO2 23  19 - 32 mEq/L  Glucose, Bld 87  70 - 99 mg/dL   BUN 15  6 - 23 mg/dL   Creatinine, Ser 1.10  0.50 - 1.35 mg/dL   Calcium 9.3  8.4 - 10.5 mg/dL   GFR calc non Af Amer 72 (*) >90 mL/min   GFR calc Af Amer 84 (*) >90 mL/min   Anion gap 15  5 - 15  TROPONIN I     Status: None   Collection Time    05/10/14  3:09 PM      Result Value Ref Range   Troponin I <0.30  <0.30 ng/mL  CBG MONITORING, ED     Status: Abnormal   Collection Time    05/10/14  5:23 PM      Result Value Ref Range   Glucose-Capillary 51 (*) 70 - 99 mg/dL  CBG MONITORING, ED     Status: Abnormal   Collection Time    05/10/14  6:08 PM      Result Value Ref Range   Glucose-Capillary 138 (*) 70 - 99 mg/dL  CBC     Status: Abnormal   Collection Time    05/10/14  9:42 PM      Result Value Ref Range   WBC 7.2  4.0 - 10.5 K/uL   RBC 4.71  4.22 - 5.81 MIL/uL   Hemoglobin 13.5  13.0 - 17.0 g/dL   HCT 38.3 (*) 39.0 - 52.0 %   MCV 81.3  78.0 - 100.0 fL   MCH 28.7  26.0 - 34.0 pg   MCHC 35.2  30.0 - 36.0 g/dL   RDW 12.4  11.5 - 15.5 %   Platelets 154  150 - 400 K/uL  CREATININE, SERUM      Status: Abnormal   Collection Time    05/10/14  9:42 PM      Result Value Ref Range   Creatinine, Ser 0.96  0.50 - 1.35 mg/dL   GFR calc non Af Amer 90 (*) >90 mL/min   GFR calc Af Amer >90  >90 mL/min  TROPONIN I     Status: None   Collection Time    05/10/14  9:42 PM      Result Value Ref Range   Troponin I <0.30  <0.30 ng/mL  BASIC METABOLIC PANEL     Status: Abnormal   Collection Time    05/11/14  2:52 AM      Result Value Ref Range   Sodium 141  137 - 147 mEq/L   Potassium 3.4 (*) 3.7 - 5.3 mEq/L   Chloride 103  96 - 112 mEq/L   CO2 26  19 - 32 mEq/L   Glucose, Bld 164 (*) 70 - 99 mg/dL   BUN 14  6 - 23 mg/dL   Creatinine, Ser 0.95  0.50 - 1.35 mg/dL   Calcium 8.7  8.4 - 10.5 mg/dL   GFR calc non Af Amer >90  >90 mL/min   GFR calc Af Amer >90  >90 mL/min   Anion gap 12  5 - 15  CBC     Status: None   Collection Time    05/11/14  2:52 AM      Result Value Ref Range   WBC 7.7  4.0 - 10.5 K/uL   RBC 4.88  4.22 - 5.81 MIL/uL   Hemoglobin 14.3  13.0 - 17.0 g/dL   HCT 39.8  39.0 - 52.0 %   MCV 81.6  78.0 - 100.0 fL   MCH 29.3  26.0 - 34.0 pg   MCHC 35.9  30.0 - 36.0 g/dL   RDW 12.5  11.5 - 15.5 %   Platelets 157  150 - 400 K/uL  PROTIME-INR     Status: None   Collection Time    05/11/14  2:52 AM      Result Value Ref Range   Prothrombin Time 13.5  11.6 - 15.2 seconds   INR 1.03  0.00 - 1.49  HEMOGLOBIN A1C     Status: Abnormal   Collection Time    05/11/14  2:52 AM      Result Value Ref Range   Hemoglobin A1C 8.4 (*) <5.7 %   Mean Plasma Glucose 194 (*) <117 mg/dL  TROPONIN I     Status: None   Collection Time    05/11/14  2:52 AM      Result Value Ref Range   Troponin I <0.30  <0.30 ng/mL  LIPID PANEL     Status: Abnormal   Collection Time    05/11/14  2:52 AM      Result Value Ref Range   Cholesterol 130  0 - 200 mg/dL   Triglycerides 108  <150 mg/dL   HDL 38 (*) >39 mg/dL   Total CHOL/HDL Ratio 3.4     VLDL 22  0 - 40 mg/dL   LDL Cholesterol 70  0 -  99 mg/dL    Disposition:  Follow-up Information   Follow up with Dola Argyle, MD On 06/01/2014. (11:30 am)    Specialty:  Cardiology   Contact information:   [address] [address] [phone]       Discharge Medications:    Medication List         acetaminophen-codeine 300-30 MG per tablet  Commonly known as:  TYLENOL #3  Take 1 tablet by mouth every 4 (four) hours as needed for moderate pain.     albuterol 108 (90 BASE) MCG/ACT inhaler  Commonly known as:  PROVENTIL HFA;VENTOLIN HFA  Inhale 2 puffs into the lungs every 6 (six) hours as needed for wheezing or shortness of breath.     aspirin EC 81 MG tablet  Take 1 tablet (81 mg total) by mouth daily.     benazepril 40 MG tablet  Commonly known as:  LOTENSIN  Take 1 tablet (40 mg total) by mouth daily.     benzonatate 100 MG capsule  Commonly known as:  TESSALON PERLES  Take 1 capsule (100 mg total) by mouth every 6 (six) hours as needed for cough.     carvedilol 6.25 MG tablet  Commonly known as:  COREG  Take one tablet by mouth twice daily     FLAX SEED OIL PO  Take by mouth daily.     glucose blood test strip  Commonly known as:  ONE TOUCH ULTRA TEST  1 each by Other route 6 (six) times daily.     HUMULIN R 100 units/mL injection  Generic drug:  insulin regular  USE 10-30 UNITS PER SLIDING SCALE WITH EACH MEAL     hydrochlorothiazide 25 MG tablet  Commonly known as:  HYDRODIURIL  Take 1 tablet (25 mg total) by mouth daily.     insulin detemir 100 UNIT/ML injection  Commonly known as:  LEVEMIR  Inject 0.6 mLs (60 Units total) into the skin at bedtime.     INSULIN SYRINGE .5CC/31GX5/16" 31G X 5/16" 0.5 ML Misc  - Use to inject insulin 3 times  a day  - Dx code 250.00     latanoprost 0.005 % ophthalmic solution  Commonly known as:  XALATAN  Place 1 drop into both eyes at bedtime.     levocetirizine 5 MG tablet  Commonly known as:  XYZAL  Take 1 tablet (5 mg  total) by mouth every evening.     meclizine 25 MG tablet  Commonly known as:  ANTIVERT  Take 1 tablet (25 mg total) by mouth 4 (four) times daily. Prn dizziness     metoCLOPramide 5 MG tablet  Commonly known as:  REGLAN  Take 1 tablet (5 mg total) by mouth 4 (four) times daily - after meals and at bedtime.     naproxen 500 MG tablet  Commonly known as:  NAPROSYN  Take 1 tablet (500 mg total) by mouth 2 (two) times daily with a meal.     nitroGLYCERIN 0.4 MG SL tablet  Commonly known as:  NITROSTAT  Place 1 tablet (0.4 mg total) under the tongue every 5 (five) minutes x 3 doses as needed for chest pain (DO NOT USE IF YOU HAVE TAKEN VIAGRA).     ONE TOUCH ULTRA 2 W/DEVICE Kit  - Use to check blood sugar sup to 6 times daily  - Dx code 322.02     ONETOUCH DELICA LANCETS Misc  - Use to check blood sugar sup to 6 times daily.  - Dx code 542.70     ONETOUCH DELICA LANCETS 62B Misc  Use to check blood sugars up to 6 times a day. Dx code: 250.60     pravastatin 40 MG tablet  Commonly known as:  PRAVACHOL  Take 1 tablet (40 mg total) by mouth daily.     repaglinide 2 MG tablet  Commonly known as:  PRANDIN  Take up to 4 times a day before meals.     sildenafil 100 MG tablet  Commonly known as:  VIAGRA  Take 0.5 tablets (50 mg total) by mouth as needed for erectile dysfunction (DO NOT USE IF YOU ARE TAKING NTG).         Duration of Discharge Encounter: Greater than 30 minutes including physician time.  Angelena Form PA-C 05/11/2014 1:21 PM

## 2014-05-11 NOTE — Progress Notes (Signed)
Patient seen and examined and history reviewed. Agree with above findings and plan.  Tyler Wilkinson, MDFACC 05/11/2014 12:35 PM  '

## 2014-05-11 NOTE — Discharge Instructions (Signed)
Chest Pain (Nonspecific) It is often hard to give a specific diagnosis for the cause of chest pain. There is alwaSildenafil tablets (Viagra) What is this medicine? SILDENAFIL (sil DEN a fil) is used to treat erection problems in men. This medicine may be used for other purposes; ask your health care provider or pharmacist if you have questions. COMMON BRAND NAME(S): Viagra What should I tell my health care provider before I take this medicine? They need to know if you have any of these conditions: -bleeding disorders -eye or vision problems, including a rare inherited eye disease called retinitis pigmentosa -anatomical deformation of the penis, Peyronie's disease, or history of priapism (painful and prolonged erection) -heart disease, angina, a history of heart attack, irregular heart beats, or other heart problems -high or low blood pressure -history of blood diseases, like sickle cell anemia or leukemia -history of stomach bleeding -kidney disease -liver disease -stroke -an unusual or allergic reaction to sildenafil, other medicines, foods, dyes, or preservatives -pregnant or trying to get pregnant -breast-feeding How should I use this medicine? Take this medicine by mouth with a glass of water. Follow the directions on the prescription label. The dose is usually taken 1 hour before sexual activity. You should not take the dose more than once per day. Do not take your medicine more often than directed. Talk to your pediatrician regarding the use of this medicine in children. This medicine is not used in children for this condition. Overdosage: If you think you have taken too much of this medicine contact a poison control center or emergency room at once. NOTE: This medicine is only for you. Do not share this medicine with others. What if I miss a dose? This does not apply. Do not take double or extra doses. What may interact with this medicine? Do not take this medicine with any of the  following medications: -cisapride -methscopolamine nitrate -nitrates like amyl nitrite, isosorbide dinitrate, isosorbide mononitrate, nitroglycerin -nitroprusside -other medicines for erectile dysfunction like avanafil, tadalafil, vardenafil -other sildenafil products (Revatio) This medicine may also interact with the following medications: -certain drugs for high blood pressure -certain drugs for the treatment of HIV infection or AIDS -certain drugs used for fungal or yeast infections, like fluconazole, itraconazole, ketoconazole, and voriconazole -cimetidine -erythromycin -rifampin This list may not describe all possible interactions. Give your health care provider a list of all the medicines, herbs, non-prescription drugs, or dietary supplements you use. Also tell them if you smoke, drink alcohol, or use illegal drugs. Some items may interact with your medicine. What should I watch for while using this medicine? If you notice any changes in your vision while taking this drug, call your doctor or health care professional as soon as possible. Stop using this medicine and call your health care provider right away if you have a loss of sight in one or both eyes. Contact your doctor or health care professional right away if you have an erection that lasts longer than 4 hours or if it becomes painful. This may be a sign of a serious problem and must be treated right away to prevent permanent damage. If you experience symptoms of nausea, dizziness, chest pain or arm pain upon initiation of sexual activity after taking this medicine, you should refrain from further activity and call your doctor or health care professional as soon as possible. Do not drink alcohol to excess (examples, 5 glasses of wine or 5 shots of whiskey) when taking this medicine. When taken in excess, alcohol  can increase your chances of getting a headache or getting dizzy, increasing your heart rate or lowering your blood  pressure. Using this medicine does not protect you or your partner against HIV infection (the virus that causes AIDS) or other sexually transmitted diseases. What side effects may I notice from receiving this medicine? Side effects that you should report to your doctor or health care professional as soon as possible: -allergic reactions like skin rash, itching or hives, swelling of the face, lips, or tongue -breathing problems -changes in hearing -changes in vision -chest pain -fast, irregular heartbeat -prolonged or painful erection -seizures Side effects that usually do not require medical attention (report to your doctor or health care professional if they continue or are bothersome): -back pain -dizziness -flushing -headache -indigestion -muscle aches -nausea -stuffy or runny nose This list may not describe all possible side effects. Call your doctor for medical advice about side effects. You may report side effects to FDA at 1-800-FDA-1088. Where should I keep my medicine? Keep out of reach of children. Store at room temperature between 15 and 30 degrees C (59 and 86 degrees F). Throw away any unused medicine after the expiration date. NOTE: This sheet is a summary. It may not cover all possible information. If you have questions about this medicine, talk to your doctor, pharmacist, or health care provider.  2015, Elsevier/Gold Standard. (2012-08-05 12:43:54) ys a chance that your pain could be related to something serious, such as a heart attack or a blood clot in the lungs. You need to follow up with your health care provider for further evaluation. CAUSES   Heartburn.  Pneumonia or bronchitis.  Anxiety or stress.  Inflammation around your heart (pericarditis) or lung (pleuritis or pleurisy).  A blood clot in the lung.  A collapsed lung (pneumothorax). It can develop suddenly on its own (spontaneous pneumothorax) or from trauma to the chest.  Shingles infection  (herpes zoster virus). The chest wall is composed of bones, muscles, and cartilage. Any of these can be the source of the pain.  The bones can be bruised by injury.  The muscles or cartilage can be strained by coughing or overwork.  The cartilage can be affected by inflammation and become sore (costochondritis). DIAGNOSIS  Lab tests or other studies may be needed to find the cause of your pain. Your health care provider may have you take a test called an ambulatory electrocardiogram (ECG). An ECG records your heartbeat patterns over a 24-hour period. You may also have other tests, such as:  Transthoracic echocardiogram (TTE). During echocardiography, sound waves are used to evaluate how blood flows through your heart.  Transesophageal echocardiogram (TEE).  Cardiac monitoring. This allows your health care provider to monitor your heart rate and rhythm in real time.  Holter monitor. This is a portable device that records your heartbeat and can help diagnose heart arrhythmias. It allows your health care provider to track your heart activity for several days, if needed.  Stress tests by exercise or by giving medicine that makes the heart beat faster. TREATMENT   Treatment depends on what may be causing your chest pain. Treatment may include:  Acid blockers for heartburn.  Anti-inflammatory medicine.  Pain medicine for inflammatory conditions.  Antibiotics if an infection is present.  You may be advised to change lifestyle habits. This includes stopping smoking and avoiding alcohol, caffeine, and chocolate.  You may be advised to keep your head raised (elevated) when sleeping. This reduces the chance of acid going  backward from your stomach into your esophagus. Most of the time, nonspecific chest pain will improve within 2-3 days with rest and mild pain medicine.  HOME CARE INSTRUCTIONS   If antibiotics were prescribed, take them as directed. Finish them even if you start to feel  better.  For the next few days, avoid physical activities that bring on chest pain. Continue physical activities as directed.  Do not use any tobacco products, including cigarettes, chewing tobacco, or electronic cigarettes.  Avoid drinking alcohol.  Only take medicine as directed by your health care provider.  Follow your health care provider's suggestions for further testing if your chest pain does not go away.  Keep any follow-up appointments you made. If you do not go to an appointment, you could develop lasting (chronic) problems with pain. If there is any problem keeping an appointment, call to reschedule. SEEK MEDICAL CARE IF:   Your chest pain does not go away, even after treatment.  You have a rash with blisters on your chest.  You have a fever. SEEK IMMEDIATE MEDICAL CARE IF:   You have increased chest pain or pain that spreads to your arm, neck, jaw, back, or abdomen.  You have shortness of breath.  You have an increasing cough, or you cough up blood.  You have severe back or abdominal pain.  You feel nauseous or vomit.  You have severe weakness.  You faint.  You have chills. This is an emergency. Do not wait to see if the pain will go away. Get medical help at once. Call your local emergency services (911 in [place] Do not drive yourself to the hospital. MAKE SURE YOU:   Understand these instructions.  Will watch your condition.  Will get help right away if you are not doing well or get worse. Document Released: 05/15/2005 Document Revised: 08/10/2013 Document Reviewed: 03/10/2008 [facility] Patient Information 2015 [place], [place]. This information is not intended to replace advice given to you by your health care provider. Make sure you discuss any questions you have with your health care provider.

## 2014-05-11 NOTE — Progress Notes (Signed)
Nutrition Brief Note  Patient identified on the Malnutrition Screening Tool (MST) Report for unintentional weight loss. Per discussion with patient, he has lost ~15 lbs in one month. Per review of usual weights below, patient's weight has been stable over the past 3 months. He says that he has a good appetite and is hungry. Currently NPO for procedure.  Wt Readings from Last 15 Encounters:  05/11/14 209 lb (94.802 kg)  02/11/14 210 lb (95.255 kg)  01/26/14 208 lb (94.348 kg)  01/13/14 215 lb (97.523 kg)  10/20/13 215 lb 1.6 oz (97.569 kg)  07/06/13 218 lb 1.6 oz (98.93 kg)  03/12/13 219 lb 11.2 oz (99.655 kg)  01/05/13 215 lb (97.523 kg)  09/25/12 221 lb 9.6 oz (100.517 kg)  07/10/12 223 lb (101.152 kg)  03/20/12 220 lb 12.8 oz (100.154 kg)  01/16/12 216 lb (97.977 kg)  01/03/12 216 lb 9.6 oz (98.249 kg)  06/24/11 218 lb (98.884 kg)  06/21/11 216 lb 8 oz (98.204 kg)    Body mass index is 27.58 kg/(m^2). Patient meets criteria for overweight based on current BMI.   Current diet order is NPO for procedures. Labs and medications reviewed.   No nutrition interventions warranted at this time. If nutrition issues arise, please consult RD.   Joaquin Courts, RD, LDN, CNSC Pager [phone] After Hours Pager [phone]

## 2014-05-18 ENCOUNTER — Encounter (HOSPITAL_COMMUNITY): Payer: 59

## 2014-05-18 ENCOUNTER — Ambulatory Visit: Payer: 59 | Admitting: Cardiology

## 2014-05-18 ENCOUNTER — Other Ambulatory Visit: Payer: 59

## 2014-05-29 ENCOUNTER — Encounter: Payer: Self-pay | Admitting: Cardiology

## 2014-06-01 ENCOUNTER — Encounter: Payer: 59 | Admitting: Cardiology

## 2014-06-07 ENCOUNTER — Other Ambulatory Visit: Payer: Self-pay | Admitting: *Deleted

## 2014-06-07 DIAGNOSIS — I10 Essential (primary) hypertension: Secondary | ICD-10-CM

## 2014-06-07 NOTE — Telephone Encounter (Signed)
Pt has an appt scheduled 11/6.

## 2014-06-08 ENCOUNTER — Other Ambulatory Visit: Payer: Self-pay | Admitting: *Deleted

## 2014-06-08 DIAGNOSIS — I10 Essential (primary) hypertension: Secondary | ICD-10-CM

## 2014-06-08 DIAGNOSIS — J302 Other seasonal allergic rhinitis: Secondary | ICD-10-CM

## 2014-06-08 DIAGNOSIS — E1143 Type 2 diabetes mellitus with diabetic autonomic (poly)neuropathy: Secondary | ICD-10-CM

## 2014-06-08 MED ORDER — HYDROCHLOROTHIAZIDE 25 MG PO TABS: 25.0000 mg | ORAL_TABLET | Freq: Every day | ORAL | Status: DC

## 2014-06-09 MED ORDER — GLUCOSE BLOOD VI STRP: ORAL_STRIP | Status: DC

## 2014-06-09 MED ORDER — BENAZEPRIL HCL 40 MG PO TABS: 40.0000 mg | ORAL_TABLET | Freq: Every day | ORAL | Status: DC

## 2014-06-09 MED ORDER — LEVOCETIRIZINE DIHYDROCHLORIDE 5 MG PO TABS: 5.0000 mg | ORAL_TABLET | Freq: Every evening | ORAL | Status: DC

## 2014-06-10 ENCOUNTER — Telehealth: Payer: Self-pay | Admitting: *Deleted

## 2014-06-10 NOTE — Telephone Encounter (Signed)
error 

## 2014-06-23 ENCOUNTER — Encounter: Payer: Self-pay | Admitting: Cardiology

## 2014-06-24 ENCOUNTER — Encounter: Payer: Self-pay | Admitting: Cardiology

## 2014-06-24 ENCOUNTER — Ambulatory Visit (INDEPENDENT_AMBULATORY_CARE_PROVIDER_SITE_OTHER): Payer: 59 | Admitting: Internal Medicine

## 2014-06-24 ENCOUNTER — Encounter: Payer: Self-pay | Admitting: Internal Medicine

## 2014-06-24 ENCOUNTER — Ambulatory Visit (INDEPENDENT_AMBULATORY_CARE_PROVIDER_SITE_OTHER): Payer: 59 | Admitting: Cardiology

## 2014-06-24 VITALS — BP 122/68 | HR 72 | Ht 73.0 in | Wt 213.0 lb

## 2014-06-24 DIAGNOSIS — I10 Essential (primary) hypertension: Secondary | ICD-10-CM

## 2014-06-24 DIAGNOSIS — I2510 Atherosclerotic heart disease of native coronary artery without angina pectoris: Secondary | ICD-10-CM

## 2014-06-24 DIAGNOSIS — E1142 Type 2 diabetes mellitus with diabetic polyneuropathy: Secondary | ICD-10-CM

## 2014-06-24 DIAGNOSIS — Z23 Encounter for immunization: Secondary | ICD-10-CM

## 2014-06-24 DIAGNOSIS — G990 Autonomic neuropathy in diseases classified elsewhere: Secondary | ICD-10-CM

## 2014-06-24 DIAGNOSIS — E785 Hyperlipidemia, unspecified: Secondary | ICD-10-CM

## 2014-06-24 DIAGNOSIS — Z0000 Encounter for general adult medical examination without abnormal findings: Secondary | ICD-10-CM

## 2014-06-24 DIAGNOSIS — N529 Male erectile dysfunction, unspecified: Secondary | ICD-10-CM

## 2014-06-24 DIAGNOSIS — E1143 Type 2 diabetes mellitus with diabetic autonomic (poly)neuropathy: Secondary | ICD-10-CM

## 2014-06-24 DIAGNOSIS — E782 Mixed hyperlipidemia: Secondary | ICD-10-CM

## 2014-06-24 DIAGNOSIS — N528 Other male erectile dysfunction: Secondary | ICD-10-CM

## 2014-06-24 LAB — GLUCOSE, CAPILLARY: Glucose-Capillary: 164 mg/dL — ABNORMAL HIGH (ref 70–99)

## 2014-06-24 MED ORDER — NITROGLYCERIN 0.4 MG SL SUBL: 0.4000 mg | SUBLINGUAL_TABLET | SUBLINGUAL | Status: DC | PRN

## 2014-06-24 MED ORDER — ROSUVASTATIN CALCIUM 10 MG PO TABS: 10.0000 mg | ORAL_TABLET | Freq: Every day | ORAL | Status: DC

## 2014-06-24 MED ORDER — PRAVASTATIN SODIUM 40 MG PO TABS: 40.0000 mg | ORAL_TABLET | Freq: Every day | ORAL | Status: DC

## 2014-06-24 MED ORDER — SILDENAFIL CITRATE 100 MG PO TABS: 50.0000 mg | ORAL_TABLET | ORAL | Status: DC | PRN

## 2014-06-24 MED ORDER — AMITRIPTYLINE HCL 50 MG PO TABS: 50.0000 mg | ORAL_TABLET | Freq: Every day | ORAL | Status: DC

## 2014-06-24 MED ORDER — REPAGLINIDE 2 MG PO TABS: ORAL_TABLET | ORAL | Status: DC

## 2014-06-24 NOTE — Progress Notes (Signed)
Patient ID: Tyler Wilkinson, male   DOB: [birth year], 58 y.o.   MRN: [id number]    HPI  patient is seen today to follow-up hospitalization for coronary disease. The patient has known disease. He was recently admitted with chest discomfort. There was no evidence of an MI. He had an exercise Myoview on May 10, 2014. He walked 9 minutes on the Bruce protocol. There was no sign of ischemia. It was felt that he did not need any further workup. He is now here for follow-up. He is not had any recurring symptoms since his hospitalization. He said that he would not start a statin until he talked with me. We had a long discussion. I explained to him the outcome data showing that his statin is beneficial to him for the long-term.  As part of today's evaluation I have reviewed the hospital records. I reviewed the admission and discharge summaries. I reviewed the nuclear stress study.  No Known Allergies  Current Outpatient Prescriptions  Medication Sig Dispense Refill  . acetaminophen-codeine (TYLENOL #3) 300-30 MG per tablet Take 1 tablet by mouth every 4 (four) hours as needed for moderate pain. 30 tablet 0  . albuterol (PROVENTIL HFA;VENTOLIN HFA) 108 (90 BASE) MCG/ACT inhaler Inhale 2 puffs into the lungs every 6 (six) hours as needed for wheezing or shortness of breath. 1 Inhaler 2  . amitriptyline (ELAVIL) 50 MG tablet Take 1 tablet (50 mg total) by mouth at bedtime. 30 tablet 11  . aspirin EC 81 MG tablet Take 1 tablet (81 mg total) by mouth daily.    . benazepril (LOTENSIN) 40 MG tablet Take 1 tablet (40 mg total) by mouth daily. 90 tablet 3  . Blood Glucose Monitoring Suppl (ONE TOUCH ULTRA 2) W/DEVICE KIT Use to check blood sugar sup to 6 times daily Dx code 250.00 1 each 0  . carvedilol (COREG) 6.25 MG tablet Take one tablet by mouth twice daily 120 tablet 1  . Flaxseed, Linseed, (FLAX SEED OIL PO) Take by mouth daily.      Marland Kitchen glucose blood (ONE TOUCH ULTRA TEST) test strip Use to check blood  sugars 3-4 times daily. Code: E11.42. 200 each 11  . HUMULIN R 100 UNIT/ML injection USE 10-30 UNITS PER SLIDING SCALE WITH EACH MEAL 30 mL 3  . hydrochlorothiazide (HYDRODIURIL) 25 MG tablet Take 1 tablet (25 mg total) by mouth daily. 60 tablet 5  . insulin detemir (LEVEMIR) 100 UNIT/ML injection Inject 0.6 mLs (60 Units total) into the skin at bedtime. 20 mL 12  . Insulin Syringe-Needle U-100 (INSULIN SYRINGE .5CC/31GX5/16") 31G X 5/16" 0.5 ML MISC Use to inject insulin 3 times a day Dx code 250.00 100 each 12  . latanoprost (XALATAN) 0.005 % ophthalmic solution Place 1 drop into both eyes at bedtime.    Marland Kitchen levocetirizine (XYZAL) 5 MG tablet Take 1 tablet (5 mg total) by mouth every evening. 30 tablet 11  . naproxen (NAPROSYN) 500 MG tablet Take 1 tablet (500 mg total) by mouth 2 (two) times daily with a meal. 60 tablet 3  . nitroGLYCERIN (NITROSTAT) 0.4 MG SL tablet Place 1 tablet (0.4 mg total) under the tongue every 5 (five) minutes x 3 doses as needed for chest pain (DO NOT USE IF YOU HAVE TAKEN VIAGRA). 25 tablet 2  . ONETOUCH DELICA LANCETS 53Z MISC Use to check blood sugars up to 6 times a day. Dx code: 250.60 200 each 6  . ONETOUCH DELICA LANCETS MISC Use to check blood  sugar sup to 6 times daily. Dx code 250.00 200 each 12  . pravastatin (PRAVACHOL) 40 MG tablet Take 1 tablet (40 mg total) by mouth daily. 30 tablet 11  . repaglinide (PRANDIN) 2 MG tablet Take up to 4 times a day before meals. 120 tablet 11  . sildenafil (VIAGRA) 100 MG tablet Take 0.5 tablets (50 mg total) by mouth as needed for erectile dysfunction (DO NOT USE IF YOU ARE TAKING NTG). 20 tablet 1  . metoCLOPramide (REGLAN) 5 MG tablet Take 1 tablet (5 mg total) by mouth 4 (four) times daily - after meals and at bedtime. 120 tablet 1   No current facility-administered medications for this visit.    History   Social History  . Marital Status: Single    Spouse Name: N/A    Number of Children: N/A  . Years of  Education: N/A   Occupational History  . 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 Smoking status: Former Smoker -- .2 years    Types: Cigarettes    Quit date: 01/26/1973  . Smokeless tobacco: Never Used  . Alcohol Use: Yes     Comment: 05/10/2014 "might drink 2-3 times/yr"  . Drug Use: Yes    Special: Marijuana     Comment: 05/10/2014 "related to stomach problems; last time was ~ 1 month ago"  . Sexual Activity: Not Currently   Other Topics Concern  . Not on file   Social History Narrative    Family History  Problem Relation Age of Onset  . Hypertension Mother     Past Medical History  Diagnosis Date  . Hypertension   . Erectile dysfunction   . Dyslipidemia   . CAD (coronary artery disease)     stent RCA 2006 (other residual disease). /  nuclear 2008  no ischemia, inferolateral scar.  . Ejection fraction     EF 55%,echo, 2008, inferolateral hypokinesis,  . Myocardial infarction 2006  . Chronic bronchitis     "anytime I get sick it goes into bronchitis; probably get it q yr"  . Sleep apnea     "not wearing mask" (05/10/2014)  . Type I diabetes mellitus dx'd 1972  . Daily headache     "just recently" (05/10/2014)  . Gastroparesis     Past Surgical History  Procedure Laterality Date  . Coronary angioplasty with stent placement  ~ 2006    "1"    Patient Active Problem List   Diagnosis Date Noted  . Dyslipidemia     Priority: High  . CAD (coronary artery disease), native coronary artery     Priority: High  . Ejection fraction     Priority: High  . Essential hypertension 05/10/2014  . Acute upper respiratory infections of unspecified site 07/07/2013  . Sprain of right ankle 07/10/2012  . Diabetic gastroparesis 05/05/2012  . Dental caries 05/05/2012  . Bilateral carpal tunnel syndrome 06/09/2010  . ULNAR NEUROPATHY 06/09/2010  . CERUMEN IMPACTION 02/24/2009  . SLEEP APNEA 12/28/2008  . UNSPECIFIED DISORDER OF LIVER  09/16/2008  . ASTHMA, INTRINSIC, WITH ACUTE EXACERBATION 12/24/2006  . DYSPEPSIA 12/19/2006  . Allergic rhinitis 07/28/2006  . ERECTILE DYSFUNCTION, ORGANIC 07/28/2006  . 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 All other systems are reviewed and are negative.  PHYSICAL EXAM  patient is oriented to person time and place. Affect is normal. Head is atraumatic. Sclera and conjunctiva are normal. There is no jugular venous distention. Lungs are clear. Respiratory effort is not labored. Cardiac exam reveals an S1 and S2. The abdomen is soft. There is no peripheral edema. There are no musculoskeletal deformities. There are no skin rashes.  Filed Vitals:   06/24/14 1632  BP: 122/68  Pulse: 72  Height: 6' 1"  (1.854 m)  Weight: 213 lb (96.616 kg)     ASSESSMENT & PLAN

## 2014-06-24 NOTE — Patient Instructions (Signed)
Thank you for coming to clinic today [name].  General instructions: -Try to maintain a healthy diet low in carbohydrates to improve your blood sugar. -Bring your glucose meter next visit. -I sent your prescriptions to your pharmacy. -Please make a follow up appointment to return to clinic in 2 months to discuss the disposable insulin pumps.  Please bring your medicines with you each time you come.   Medicines may be  Eye drops  Herbal   Vitamins  Pills  Seeing these help us take care of you.

## 2014-06-24 NOTE — Progress Notes (Signed)
   Subjective:    Patient ID: Tyler Wilkinson, male    DOB: 06/04/1956, 58 y.o.   MRN: [id number]  HPI Tyler Wilkinson is a 58 year old man with history of type 2 diabetes, hypertension, and CAD presenting for routine follow-up.  [name] was recently hospitalized from 05/10/14-05/11/14 for chest pain.  Work-up including exercise Myoview was negative, and he was prescribed PRN NTG for his pain.  He is scheduled to follow-up with Dr. Myrtis SerKatz of cardiology this afternoon.  He has not had any chest pain since discharge from the hospital.  He reports numbness and tingling in his hands and feet, worse in his hands. He has intermittent burning pain in his hands that has been occuring more frequently.  He has tried gabapentin in the past, but it made him too groggy.  His blood sugars are running in the 200-300s, but he forgot his meter today.  He says that he typically only uses his sliding scale 2 times per day instead of the prescribed 3.  He is interested in using a disposable insulin pump, and has discussed this with Lupita LeashDonna in the past.  Review of Systems  Constitutional: Negative for fever, activity change, appetite change and fatigue.  HENT: Negative for congestion and sore throat.   Eyes: Negative for visual disturbance.  Respiratory: Negative for chest tightness, shortness of breath and wheezing.   Cardiovascular: Negative for chest pain, palpitations and leg swelling.  Gastrointestinal: Negative for nausea, vomiting, diarrhea, constipation and abdominal distention.  Genitourinary: Negative for difficulty urinating.  Musculoskeletal: Positive for myalgias.  Skin: Negative for rash.  Neurological: Positive for numbness. Negative for dizziness, weakness and headaches.  Psychiatric/Behavioral: Negative for dysphoric mood and agitation.       Objective:   Physical Exam  Constitutional: He is oriented to person, place, and time. He appears well-developed and well-nourished.  HENT:  Head:  Normocephalic and atraumatic.  Eyes: Conjunctivae are normal. Pupils are equal, round, and reactive to light.  Ptosis bilaterally.  Neck: Normal range of motion. Neck supple.  Cardiovascular: Normal rate, regular rhythm and normal heart sounds.   Pulmonary/Chest: Effort normal and breath sounds normal. No respiratory distress.  Abdominal: Soft. Bowel sounds are normal. He exhibits no distension. There is no tenderness.  Musculoskeletal: Normal range of motion. He exhibits no edema or tenderness.  Neurological: He is alert and oriented to person, place, and time. No cranial nerve deficit.  Glove and stocking numbness bilaterally.  Skin: Skin is warm and dry. No erythema.  Psychiatric: He has a normal mood and affect.          Assessment & Plan:  Please see problem-based assessment and plan.

## 2014-06-24 NOTE — Assessment & Plan Note (Signed)
I made it very clear to the patient that it is important for him to take a statin. He had many questions. We had a long discussion about what outcome data means. I explained to him that there was outcome data showing the importance of statins for him. He is now willing to use a statin. He says he felt poorly with Lipitor in the past. He will use pravastatin. He will also use Crestor if he can afford it. We are trying to help gather information for him.  As part of today's evaluation I spent greater than 25 minutes with his total care. More than half of this time has been with direct counseling with him. I had a very extensive discussion with him about the importance of statins. I also showed him a diagram of the coronary artery that was affected when he had a stent in the past.

## 2014-06-24 NOTE — Assessment & Plan Note (Addendum)
BP Readings from Last 3 Encounters:  06/24/14 122/68  05/11/14 133/72  02/11/14 110/58    Lab Results  Component Value Date   NA 141 05/11/2014   K 3.4* 05/11/2014   CREATININE 0.95 05/11/2014    Assessment: Blood pressure control: controlled Progress toward BP goal:  at goal  Plan: Medications:  continue current medications: benazepril 40 mg daily, carvedilol 6.25 mg BID, HCTZ 25 mg daily.

## 2014-06-24 NOTE — Assessment & Plan Note (Signed)
Due for Tdap, colonoscopy in 2010 was normal. -Colonoscopy in 5 years. -Tdap today.

## 2014-06-24 NOTE — Assessment & Plan Note (Addendum)
Lab Results  Component Value Date   HGBA1C 8.4* 05/11/2014   HGBA1C 8.5 01/26/2014   HGBA1C 8.2 10/20/2013     Assessment: Diabetes control: fair control Progress toward A1C goal:  improved Comments: Worsening diabetic neuropathy.  Tried gabapentin in past, but too sedating.  Did not bring glucose monitor, so I am reluctant to change his insulin regimen.  He ran out of Reglan, but he does not think this has been helping with his gastroparesis, so he does not wish to continue using this.  Plan: Medications:  continue current medications: Levemir 60 units QHS, Humulin R 10-30 unit sliding scale with meals, repaglinide before meals. Home glucose monitoring: Frequency: 4 times a day Timing: before meals, at bedtime Instruction/counseling given: reminded to get eye exam, reminded to bring blood glucose meter & log to each visit, reminded to bring medications to each visit, discussed foot care, discussed the need for weight loss and discussed diet Educational resources provided:   Self management tools provided:   Other plans: Amitriptyline 50 mg QHS for neuropathy.  Discussed that glucose control is best treatment for neuropathy.  Will discuss disposable insulin pumps with Lupita LeashDonna and try to initiate when possible.  Asked him to bring his glucose monitor to the next visit to address.

## 2014-06-24 NOTE — Assessment & Plan Note (Signed)
The patient has known disease. His cath in October, 2012 revealed an occluded stent to a small posterior lateral branch. There was moderate LAD disease. There was normal LV function. With his recent admission a stress nuclear study showed no ischemia. He is stable now. I've had a very extensive discussion with him about the importance of his taking a statin.

## 2014-06-24 NOTE — Addendum Note (Signed)
**Note De-Identified Akylah Hascall Obfuscation** Addended by: Demetrios LollVIA, Quince Santana M on: 06/24/2014 05:43 PM   Modules accepted: Orders

## 2014-06-24 NOTE — Assessment & Plan Note (Signed)
Denies having any chest pain currently.  Recent exercise Myoview without ischemia.  Was prescribed NTG during recent hospitalization.  He knows not to take this at the same time as Viagra, and I reinforced this.  He has not been taking his statin because he did not think this was important.  I emphasized the importance of taking this medication even though his lipid panel may look improved due to decreased mortality.  He agreed to restart pravastatin. -Reordered pravastatin 40 mg daily. -Continue benazepril, carvedilol, and aspirin.  -Continue NTG PRN. -Follow up with Dr. Myrtis SerKatz of cardiology this afternoon.

## 2014-06-24 NOTE — Patient Instructions (Signed)
Your physician has recommended you make the following change in your medication: stop taking Pravastatin and start taking Crestor 10 mg daily  Your physician recommends that you return for lab work in: July 22, 2014 (do not eat or drink after midnight the night before).  Your physician wants you to follow-up in: 6 months. You will receive a reminder letter in the mail two months in advance. If you don't receive a letter, please call our office to schedule the follow-up appointment.

## 2014-06-24 NOTE — Assessment & Plan Note (Signed)
Blood pressures controlled. No change in therapy. 

## 2014-06-24 NOTE — Assessment & Plan Note (Signed)
Needs refill of meds. -Refilled Viagra. -Emphasized not combining with SL NTG.

## 2014-06-27 ENCOUNTER — Telehealth: Payer: Self-pay | Admitting: Dietician

## 2014-06-27 DIAGNOSIS — E1143 Type 2 diabetes mellitus with diabetic autonomic (poly)neuropathy: Secondary | ICD-10-CM

## 2014-06-27 NOTE — Telephone Encounter (Signed)
Patient calling to find out about the VGO disposable insulin pump. Told him I would discuss with Dr. Glenard HaringModing.  If you agree that he is an appropriate patient for this:  He will need orders for the VGo 40 (40 units of basal and 36 units of bolus- this is in this note for your convenience)  and 3 vials of rapid acting insulin vials with order for how many units he is supposed to take with each meal (up to 12 each meal) and other diabetes medicines he should continue or discontinue. Suggest he stop all other insulin and prandin as well.  He will need to meet with CDE for training and then again for follow up.

## 2014-06-28 NOTE — Progress Notes (Signed)
INTERNAL MEDICINE TEACHING ATTENDING ADDENDUM - Earl LagosNischal Darielle Hancher, MD: I personally saw and evaluated [name] in this clinic visit in conjunction with the resident, Dr. Glenard HaringModing. I have discussed patient's plan of care with medical resident during this visit. I have confirmed the physical exam findings and have read and agree with the clinic note

## 2014-06-29 ENCOUNTER — Telehealth: Payer: Self-pay | Admitting: *Deleted

## 2014-06-29 ENCOUNTER — Encounter: Payer: Self-pay | Admitting: Internal Medicine

## 2014-06-29 ENCOUNTER — Ambulatory Visit (INDEPENDENT_AMBULATORY_CARE_PROVIDER_SITE_OTHER): Payer: 59 | Admitting: Internal Medicine

## 2014-06-29 VITALS — BP 136/80 | HR 78 | Temp 97.6°F | Ht 72.0 in | Wt 212.8 lb

## 2014-06-29 DIAGNOSIS — J029 Acute pharyngitis, unspecified: Secondary | ICD-10-CM

## 2014-06-29 DIAGNOSIS — B9789 Other viral agents as the cause of diseases classified elsewhere: Principal | ICD-10-CM

## 2014-06-29 DIAGNOSIS — J028 Acute pharyngitis due to other specified organisms: Principal | ICD-10-CM

## 2014-06-29 MED ORDER — V-GO 40 KIT: PACK | Status: DC

## 2014-06-29 MED ORDER — INSULIN GLULISINE 100 UNIT/ML IJ SOLN: INTRAMUSCULAR | Status: DC

## 2014-06-29 NOTE — Telephone Encounter (Signed)
Patient scheduled for 07-08-14 for VGo, he eats 2-3 times a day.  He'd like to use the apidra for his rapid acting insulin so he can use the 0$ copay card.

## 2014-06-29 NOTE — Patient Instructions (Signed)
It was a pleasure to see you today. We recommend you treat your sore throat with over the counter lozenges such as Halls, a salt water gargle, steam inhalamation. You can also use over the counter afrin twice a day in each nostril for only three days. If you do not improve in about 7-10 days, please come back to the clinic. Please return to clinic or seek medical attention if you have any new or worsening fever, chest pain, trouble breathing, or other worrisome medical condition. We look forward to seeing you again soon.  Farley LyAdam Toshiyuki Fredell, MD  General Instructions:   Please bring your medicines with you each time you come to clinic.  Medicines may include prescription medications, over-the-counter medications, herbal remedies, eye drops, vitamins, or other pills.   Progress Toward Treatment Goals:  Treatment Goal 06/24/2014  Hemoglobin A1C improved  Blood pressure at goal  Prevent falls -    Self Care Goals & Plans:  Self Care Goal 10/20/2013  Manage my medications take my medicines as prescribed; bring my medications to every visit; refill my medications on time; follow the sick day instructions if I am sick  Monitor my health keep track of my blood glucose; bring my glucose meter and log to each visit  Eat healthy foods eat smaller portions  Be physically active find an activity I enjoy    Home Blood Glucose Monitoring 06/24/2014  Check my blood sugar 4 times a day  When to check my blood sugar before meals; at bedtime     Care Management & Community Referrals:  Referral 10/20/2013  Referrals made for care management support none needed

## 2014-06-29 NOTE — Assessment & Plan Note (Signed)
See HPI. Mr Tyler Wilkinson likely has a viral infection of the oropharynx.  A bacterial cause is unlikely and we will not do strep testing at this time with 0/4 Centor criteria (he is afebrile, no cervical lymphadenopathy, he has a cough, and there is no exudate on exam. It is possible he has a viral sinus infection as well as he has tenderness on exam and complains of sinus pain. His ears are wnl on exam so sinusitis could explain the ear pain. This would be managed symptomatically as well. We counseled the patient on symptom management including over the counter lozenges, salt water gargle, steam inhalation. He also said that with his history of HTN, we would recommend no more than three days of OTC afrin BID in each nostril.  -symptom management described above. -patient instructed to return to clinic in 7-10 days if no improvement

## 2014-06-29 NOTE — Telephone Encounter (Signed)
Pt called - since last PM - problems swallowing - hurts up to ears. Problems with dizziness. Temp?Marland Kitchen. Appt made for today 06/29/14 2:30PM Dr Valentino Saxonothman. Stanton KidneyDebra Ditzler RN 06/29/14 9:40AM

## 2014-06-29 NOTE — Telephone Encounter (Signed)
I have ordered the VGo 40 for [name] along with 3 vials of Apidra.  I have discontinued his other diabetes medications, but he can continue them until he meets with the CDE for training.  Please let me know if I have entered any of these orders incorrectly.

## 2014-06-29 NOTE — Progress Notes (Signed)
   Subjective:    Patient ID: Tyler Wilkinson, male    DOB: November 20, 1955, 58 y.o.   MRN: [id number]  HPI  Mr Tyler Wilkinson is a 58 year old man with CAD s/p stent 2006, HTN, DM2, asthma who presents for sore throat. He said the throat pain began abruptly last night after work. He woke up today with it unchanged but has gotten better during the day. He described the pain as a burning feeling. He says the pain also travels to the ears. He had cola and a bologna sandwich today and tolerated well. He denies congestion but reports a small cough productive of phlegm (he did not look at the appearance). He has some aching both above and below his eyes. He thinks last night he felt cold but never took his temperature. He has a history of sinus and ear infections as an adult. He says he rarely drinks alcohol and only smoked cigarettes for 3 months as a teenager. He is also having 2-3 loose bowel movements for the past two days.  Review of Systems  Constitutional: Negative for fever, chills and diaphoresis.  HENT: Positive for ear pain, sinus pressure and sore throat. Negative for congestion, postnasal drip, rhinorrhea, trouble swallowing and voice change.   Respiratory: Positive for cough and shortness of breath.   Cardiovascular: Negative for chest pain and palpitations.  Gastrointestinal: Negative for nausea, vomiting, abdominal pain and constipation.  Neurological: Positive for headaches. Negative for weakness, light-headedness and numbness.       Objective:   Physical Exam  Constitutional: He is oriented to person, place, and time. He appears well-developed and well-nourished. No distress.  HENT:  Head: Normocephalic and atraumatic.  Right Ear: External ear normal.  Left Ear: External ear normal.  Mouth/Throat: Oropharynx is clear and moist. No oropharyngeal exudate.  Tenderness on palpation of frontal and maxillary sinuses and manipulation of both ears (L>R)  Neck:  No adenopathy appreciated but  tenderness on palpation under mandible line  Cardiovascular: Normal rate, regular rhythm, normal heart sounds and intact distal pulses.  Exam reveals no gallop and no friction rub.   No murmur heard. Pulmonary/Chest: Effort normal and breath sounds normal. No respiratory distress. He has no wheezes.  Abdominal: Soft. Bowel sounds are normal. He exhibits no distension. There is no tenderness.  Lymphadenopathy:    He has no cervical adenopathy.  Neurological: He is alert and oriented to person, place, and time.  Skin: He is not diaphoretic.          Assessment & Plan:

## 2014-07-03 NOTE — Progress Notes (Signed)
INTERNAL MEDICINE TEACHING ATTENDING ADDENDUM - Earl LagosNischal Vincie Linn, MD: I personally saw and evaluated [name] in this clinic visit in conjunction with the resident, Dr. Valentino Saxonothman. I have discussed patient's plan of care with medical resident during this visit. I have confirmed the physical exam findings and have read and agree with the clinic note including the plan with the following addition: - Pt with likely viral pharyngitis - c/w conservative measures for now - Will monitor off abx for now

## 2014-07-08 ENCOUNTER — Ambulatory Visit (INDEPENDENT_AMBULATORY_CARE_PROVIDER_SITE_OTHER): Payer: 59 | Admitting: Dietician

## 2014-07-08 ENCOUNTER — Encounter: Payer: Self-pay | Admitting: Dietician

## 2014-07-08 DIAGNOSIS — E1140 Type 2 diabetes mellitus with diabetic neuropathy, unspecified: Secondary | ICD-10-CM

## 2014-07-08 DIAGNOSIS — E1143 Type 2 diabetes mellitus with diabetic autonomic (poly)neuropathy: Secondary | ICD-10-CM

## 2014-07-08 MED ORDER — ONETOUCH ULTRA 2 W/DEVICE KIT: PACK | Status: DC

## 2014-07-08 NOTE — Patient Instructions (Addendum)
Check blood sugars before meals and bedtime.  Change  VGO about the same time every day.  1- Remove used VGO- retract needle and remove  2- Fill new VGO  3- Apply new VGO - give 3 clicks before each meal. (6 units)   4- Be sure to check grey bar to be sure it is giving insulin  I will call you tomorrow (Saturday about  2-4 PM- after you have changed the VGO) I will need your blood sugars from the past 24 hours.   Please bring your meters by next Wednesday for us to download your blood sugars.   Remember you can call the [facility] first, resident on call after hours by calling [phone]

## 2014-07-08 NOTE — Progress Notes (Signed)
Medical Nutrition Therapy:  Appt start time: 1330 end time:  1400.  Assessment:  Primary concerns today: Blood sugar control.  Patient here for training on how to use the VGO disposable insulin pump to administer his insulin. He watched teaching video, observed CDE fill, place and operate the VGo, he returned the demonstration using saline, then after his questions were answered again repeated the demonstration using rapid acting insulin.  Learning Readiness: Ready and Change in progress Barriers to learning/adherence to lifestyle change: lack of support Usual eating pattern includes 2-3 meals and 1-2 snacks per day..   Usual physical activity includes work, he says his neuropathy bothers him significantly.  24-hr recall: (Up at  7-8 AM) B (930 AM)-   Can of Mt dew and tenderloin and egg sandwich Took R insulin today at this time L ( 1200 PM)- can of Mt Dew and honey bun CBG 191 at 145 in office, 162 after VGo started, 169 when patient left office  Typical day? Yes.    Progress Towards Goal(s):  In progress.   Nutritional Diagnosis:  NB-1.1 Food and nutrition-related knowledge deficit As related to lack of prior exposure to using basal and bolus insulin to cover carb intake and blood sugar needs.  As evidenced by his report and learning curve denonstrated today.    Intervention:  Nutrition education and training on [place] and how to incorporate this into his diabetes meal plan. . Coorination of acer- assisted patient in calling [facility] to activate his copay card and be sure his pharmacy has VGo available for him. Handouts given during visit include:VGO starter kit, VGO copay card, Apidra copay card and AVS Demonstrated degree of understanding via:  Teach Back   Monitoring/Evaluation:  Dietary intake, exercise,meters, and body weight in 1 week(s).

## 2014-07-13 ENCOUNTER — Ambulatory Visit: Payer: 59 | Admitting: Dietician

## 2014-07-13 ENCOUNTER — Telehealth: Payer: Self-pay | Admitting: Dietician

## 2014-07-13 DIAGNOSIS — E1143 Type 2 diabetes mellitus with diabetic autonomic (poly)neuropathy: Secondary | ICD-10-CM

## 2014-07-13 MED ORDER — V-GO 40 KIT: PACK | Status: DC

## 2014-07-13 MED ORDER — INSULIN GLULISINE 100 UNIT/ML IJ SOLN
INTRAMUSCULAR | Status: DC
Start: 2014-07-13 — End: 2014-07-19

## 2014-07-13 NOTE — Telephone Encounter (Signed)
I have updated [name]'s insulin orders to reflect boluses of 2 units with small meals, and 3-5 units with larger meals.  Please let me know how I can help with the PA.

## 2014-07-13 NOTE — Telephone Encounter (Signed)
Spoke with patient last Saturday and he is doing well with VGo. He cannot come in today due to car trouble.  Does not think 3 clicks is enough with his dinner- has been in 300s several times before bed and 2 clicks may be enough for smaller meals during work hours because he had a 44. He is giving correction doses as well and CDE encouraged him not to do this more than every 5 hours. CDE also asked his mot test his basal by skipping a meal each day in the next week and testing 3-4 times every hour. His goal is to not go over 200mg /dl. Feels better except neuropathy pain. (says he is going to try Elevil next week when he gets the money, but doesn't want to be 'zombified'.   Most blood sugars he reports are around 150-160-120. He is checking often. He also reports healthy lifestyle changes- Thinking about stopping regular soda, taking walks after dinner sometimes.  Needs a PA for 90 day supply of VGos- covered as durable medical equipment not pharmacy.   CDE has PA and will coordinate with PA nurses and Dr. Glenard HaringModing to complete it, rescheduled his appointment for next Friday and requests order from Dr. Glenard HaringModing for boluses of 2  units with small meals, 3-5 units with larger meals.  Using TDD of 76 units, insulin to carb ratio is 5-6 grams carb per unit rapid acting insulin  making it likely patient would need more insulin at main meals 50-60 g carbs. .Marland Kitchen

## 2014-07-13 NOTE — Addendum Note (Signed)
Addended by: Donavan FoilMODING, Horacio Werth J on: 07/13/2014 02:47 PM   Modules accepted: Orders

## 2014-07-19 ENCOUNTER — Other Ambulatory Visit: Payer: Self-pay | Admitting: Dietician

## 2014-07-19 MED ORDER — INSULIN ASPART 100 UNIT/ML ~~LOC~~ SOLN: SUBCUTANEOUS | Status: DC

## 2014-07-19 NOTE — Telephone Encounter (Signed)
Insurance will not pay for Apidra. He requests to be switched to Novolog. Needs 3 vials/month.  He reports the VGo is going well. Most 120 for several days, however has had a few lows and a few >300s. Having to eliminate sweet drinks to keep sugar in control.  Discussed matching insulin to carbs. He had 180 went for walk came back and his sugar was 59, ate, then had a 59 again.  Confirmed appointment for this Friday.

## 2014-07-20 ENCOUNTER — Telehealth: Payer: Self-pay | Admitting: *Deleted

## 2014-07-20 DIAGNOSIS — E1143 Type 2 diabetes mellitus with diabetic autonomic (poly)neuropathy: Secondary | ICD-10-CM

## 2014-07-20 MED ORDER — INSULIN LISPRO 100 UNIT/ML ~~LOC~~ SOLN: SUBCUTANEOUS | Status: DC

## 2014-07-20 NOTE — Telephone Encounter (Signed)
Insurance will only cover humalog, the pharmacist has changed the novolog to humalog, could you please change the med list to reflect this for future refills, if questions you may call danielle at [phone], [facility]

## 2014-07-22 ENCOUNTER — Ambulatory Visit: Payer: 59 | Admitting: Dietician

## 2014-07-24 ENCOUNTER — Other Ambulatory Visit: Payer: Self-pay | Admitting: Internal Medicine

## 2014-08-05 ENCOUNTER — Other Ambulatory Visit (INDEPENDENT_AMBULATORY_CARE_PROVIDER_SITE_OTHER): Payer: 59 | Admitting: *Deleted

## 2014-08-05 ENCOUNTER — Encounter: Payer: Self-pay | Admitting: Internal Medicine

## 2014-08-05 ENCOUNTER — Ambulatory Visit (INDEPENDENT_AMBULATORY_CARE_PROVIDER_SITE_OTHER): Payer: 59 | Admitting: Dietician

## 2014-08-05 ENCOUNTER — Encounter: Payer: Self-pay | Admitting: Dietician

## 2014-08-05 ENCOUNTER — Ambulatory Visit (INDEPENDENT_AMBULATORY_CARE_PROVIDER_SITE_OTHER): Payer: 59 | Admitting: Internal Medicine

## 2014-08-05 VITALS — BP 123/73 | HR 72 | Temp 98.0°F | Ht 72.0 in | Wt 214.0 lb

## 2014-08-05 VITALS — Wt 214.7 lb

## 2014-08-05 DIAGNOSIS — L918 Other hypertrophic disorders of the skin: Secondary | ICD-10-CM

## 2014-08-05 DIAGNOSIS — E1142 Type 2 diabetes mellitus with diabetic polyneuropathy: Secondary | ICD-10-CM

## 2014-08-05 DIAGNOSIS — N528 Other male erectile dysfunction: Secondary | ICD-10-CM

## 2014-08-05 DIAGNOSIS — I10 Essential (primary) hypertension: Secondary | ICD-10-CM

## 2014-08-05 DIAGNOSIS — E1143 Type 2 diabetes mellitus with diabetic autonomic (poly)neuropathy: Secondary | ICD-10-CM

## 2014-08-05 DIAGNOSIS — L821 Other seborrheic keratosis: Secondary | ICD-10-CM

## 2014-08-05 DIAGNOSIS — J069 Acute upper respiratory infection, unspecified: Secondary | ICD-10-CM

## 2014-08-05 DIAGNOSIS — E1140 Type 2 diabetes mellitus with diabetic neuropathy, unspecified: Secondary | ICD-10-CM

## 2014-08-05 DIAGNOSIS — N529 Male erectile dysfunction, unspecified: Secondary | ICD-10-CM

## 2014-08-05 DIAGNOSIS — J302 Other seasonal allergic rhinitis: Secondary | ICD-10-CM

## 2014-08-05 DIAGNOSIS — E785 Hyperlipidemia, unspecified: Secondary | ICD-10-CM

## 2014-08-05 DIAGNOSIS — I2510 Atherosclerotic heart disease of native coronary artery without angina pectoris: Secondary | ICD-10-CM

## 2014-08-05 LAB — LIPID PANEL
Cholesterol: 122 mg/dL (ref 0–200)
HDL: 31.8 mg/dL — ABNORMAL LOW (ref >39.00)
LDL Cholesterol: 79 mg/dL (ref 0–99)
NonHDL: 90.2
Total CHOL/HDL Ratio: 4
Triglycerides: 56 mg/dL (ref 0.0–149.0)
VLDL: 11.2 mg/dL (ref 0.0–40.0)

## 2014-08-05 LAB — HEPATIC FUNCTION PANEL
ALT: 38 U/L (ref 0–53)
AST: 28 U/L (ref 0–37)
Albumin: 3.9 g/dL (ref 3.5–5.2)
Alkaline Phosphatase: 76 U/L (ref 39–117)
BILIRUBIN TOTAL: 0.5 mg/dL (ref 0.2–1.2)
Bilirubin, Direct: 0 mg/dL (ref 0.0–0.3)
Total Protein: 6.6 g/dL (ref 6.0–8.3)

## 2014-08-05 LAB — GLUCOSE, CAPILLARY: GLUCOSE-CAPILLARY: 95 mg/dL (ref 70–99)

## 2014-08-05 LAB — POCT GLYCOSYLATED HEMOGLOBIN (HGB A1C): HEMOGLOBIN A1C: 8

## 2014-08-05 MED ORDER — ONETOUCH ULTRA 2 W/DEVICE KIT: PACK | Status: DC

## 2014-08-05 MED ORDER — ROSUVASTATIN CALCIUM 10 MG PO TABS: 10.0000 mg | ORAL_TABLET | Freq: Every day | ORAL | Status: DC

## 2014-08-05 MED ORDER — LEVOCETIRIZINE DIHYDROCHLORIDE 5 MG PO TABS: 5.0000 mg | ORAL_TABLET | Freq: Every evening | ORAL | Status: DC

## 2014-08-05 MED ORDER — CARVEDILOL 6.25 MG PO TABS: 6.2500 mg | ORAL_TABLET | Freq: Two times a day (BID) | ORAL | Status: DC

## 2014-08-05 MED ORDER — NITROGLYCERIN 0.4 MG SL SUBL: 0.4000 mg | SUBLINGUAL_TABLET | SUBLINGUAL | Status: DC | PRN

## 2014-08-05 MED ORDER — SILDENAFIL CITRATE 100 MG PO TABS: 50.0000 mg | ORAL_TABLET | ORAL | Status: DC | PRN

## 2014-08-05 MED ORDER — ASPIRIN EC 81 MG PO TBEC: 81.0000 mg | DELAYED_RELEASE_TABLET | Freq: Every day | ORAL | Status: DC

## 2014-08-05 MED ORDER — HYDROCHLOROTHIAZIDE 25 MG PO TABS: 25.0000 mg | ORAL_TABLET | Freq: Every day | ORAL | Status: DC

## 2014-08-05 MED ORDER — ONETOUCH DELICA LANCETS 33G MISC: Status: DC

## 2014-08-05 MED ORDER — AMITRIPTYLINE HCL 50 MG PO TABS: 50.0000 mg | ORAL_TABLET | Freq: Every day | ORAL | Status: DC

## 2014-08-05 MED ORDER — INSULIN ASPART 100 UNIT/ML ~~LOC~~ SOLN: SUBCUTANEOUS | Status: DC

## 2014-08-05 MED ORDER — BENAZEPRIL HCL 40 MG PO TABS: 40.0000 mg | ORAL_TABLET | Freq: Every day | ORAL | Status: DC

## 2014-08-05 MED ORDER — ASPIRIN EC 81 MG PO TBEC: 81.0000 mg | DELAYED_RELEASE_TABLET | Freq: Every day | ORAL | Status: AC

## 2014-08-05 MED ORDER — INSULIN LISPRO 100 UNIT/ML ~~LOC~~ SOLN: SUBCUTANEOUS | Status: DC

## 2014-08-05 NOTE — Assessment & Plan Note (Signed)
Lab Results  Component Value Date   HGBA1C 8.0 08/05/2014   HGBA1C 8.4* 05/11/2014   HGBA1C 8.5 01/26/2014     Assessment: Diabetes control:   Above goal Progress toward A1C goal:    Improved Comments: Met with Butch Penny today, recently met with PCP one month ago.  Plan: Medications:  continue current medications Instruction/counseling given: reminded to bring blood glucose meter & log to each visit, reminded to bring medications to each visit, discussed the need for weight loss and discussed diet Other plans: Follow up with PCP in 2 months

## 2014-08-05 NOTE — Assessment & Plan Note (Signed)
BP Readings from Last 3 Encounters:  08/05/14 123/73  06/29/14 136/80  06/24/14 122/68    Lab Results  Component Value Date   NA 141 05/11/2014   K 3.4* 05/11/2014   CREATININE 0.95 05/11/2014    Assessment: Blood pressure control:  Well-controlled Progress toward BP goal:   Improved Comments: Compliant with medications  Plan: Medications:  continue current medications

## 2014-08-05 NOTE — Assessment & Plan Note (Signed)
Assessment: Seborrheic keratosis lesion in LUQ/epigastric region 1 x 1 cm, brownish gray with stuck on appearance. Not concerning for melanoma.   Plan: -Nothing to do -However, patient wants referral for Dermatology for skin tag removal

## 2014-08-05 NOTE — Progress Notes (Signed)
Internal Medicine Clinic Attending  I saw and evaluated the patient.  I personally confirmed the key portions of the history and exam documented by Dr. Richardson and I reviewed pertinent patient test results.  The assessment, diagnosis, and plan were formulated together and I agree with the documentation in the resident's note. 

## 2014-08-05 NOTE — Assessment & Plan Note (Signed)
Assessment: Patient has several bothersome skin tags in neck region and would like referral for dermatologic removal.  Plan: -Refer to Dermatology

## 2014-08-05 NOTE — Progress Notes (Signed)
Medical Nutrition Therapy:  Appt start time: 930 end time:  1030.  Assessment:  Primary concerns today: Blood sugar control.  Patient here for follow up after he began using VGO disposable insulin pump to administer his insulin. Overall he is doing well using the VG. Blood sugars are improved. Encouraged him to use less insulin at lunch to decrease hypoglycemia, carry glucose tablets or candy at all times and treat lows appropriately without over-treating. And test basal. He report that it scars him to go to bed with a blood sugar in [facility]. This is a usual transition from injections to pump insulin administration.  Learning Readiness:  Change in progress Barriers to learning/adherence to lifestyle change: lack of support Usual eating pattern includes 2-3 meals and 1-2 snacks per day..   Usual physical activity includes work, he says his neuropathy bothers him significantly. Meter download: average- 165- much improved. 192 checks in 30 days, standard deviation is 77 Hypoglycemia: 20 low blood sugars- most occuring after lunch and around dinner, one because he delayed eating, he reports it is that he is learning about how to regulate using new means of administering insulin.  CBG 135 before visit, and 101, 105  at 945 and 10 in office.  24-hr recall:not done today. Encouraging him to drink juice instead of soda for hypoglycemia, less fat like cookies and hershey bars.    Progress Towards Goal(s):  Some progress.   Nutritional Diagnosis:  NB-1.1 Food and nutrition-related knowledge deficit As related to lack of prior exposure to using basal and bolus insulin to cover carb intake and blood sugar needs improving  As evidenced by his report,  blood sugars and VGo use mastery demonstrated today.    Intervention:  Nutrition education about hypoglycemia, insulin action, carb and fat context of foods. Coordination of care- request additional test strips  Demonstrated degree of understanding via:  Teach  Back   Monitoring/Evaluation:  Dietary intake, exercise,meter, and body weight in 4 week(s).

## 2014-08-05 NOTE — Patient Instructions (Signed)
General Instructions:   Please bring your medicines with you each time you come to clinic.  Medicines may include prescription medications, over-the-counter medications, herbal remedies, eye drops, vitamins, or other pills.   We have referred you to Dermatology for skin tag removal and to double-check your seborrheic keratosis.  Seborrheic Keratosis Seborrheic keratosis is a common, noncancerous (benign) skin growth that can occur anywhere on the skin.It looks like "stuck-on," waxy, rough, tan, brown, or black spots on the skin. These skin growths can be flat or raised.They are often called "barnacles" because of their pasted-on appearance.Usually, these skin growths appear in adulthood, around age 58, and increase in number as you age. They may also develop during pregnancy or following estrogen therapy. Many people may only have one growth appear in their lifetime, while some people may develop many growths. CAUSES It is unknown what causes these skin growths, but they appear to run in families. SYMPTOMS Seborrheic keratosis is often located on the face, chest, shoulders, back, or other areas. These growths are:  Usually painless, but may become irritated and itchy.  Yellow, brown, black, or other colors.  Slightly raised or have a flat surface.  Sometimes rough or wart-like in texture.  Often waxy on the surface.  Round or oval-shaped.  Sometimes "stuck-on" in appearance.  Sometimes single, but there are usually many growths. Any growth that bleeds, itches on a regular basis, becomes inflamed, or becomes irritated needs to be evaluated by a skin specialist (dermatologist). DIAGNOSIS Diagnosis is mainly based on the way the growths appear. In some cases, it can be difficult to tell this type of skin growth from skin cancer. A skin growth tissue sample (biopsy) may be used to confirm the diagnosis. TREATMENT Most often, treatment is not needed because the skin growths are  benign.If the skin growth is irritated easily by clothing or jewelry, causing it to scab or bleed, treatment may be recommended. Patients may also choose to have the growths removed because they do not like their appearance. Most commonly, these growths are treated with cryosurgery. In cryosurgery, liquid nitrogen is applied to "freeze" the growth. The growth usually falls off within a matter of days. A blister may form and dry into a scab that will also fall off. After the growth or scab falls off, it may leave a dark or light spot on the skin. This color may fade over time, or it may remain permanent on the skin. HOME CARE INSTRUCTIONS If the skin growths are treated with cryosurgery, the treated area needs to be kept clean with water and soap. SEEK MEDICAL CARE IF:  You have questions about these growths or other skin problems.  You develop new symptoms, including:  A change in the appearance of the skin growth.  New growths.  Any bleeding, itching, or pain in the growths.  A skin growth that looks similar to seborrheic keratosis. Document Released: 09/07/2010 Document Revised: 10/28/2011 Document Reviewed: 09/07/2010 [facility] Patient Information 2015 [employer] [place]. This information is not intended to replace advice given to you by your health care provider. Make sure you discuss any questions you have with your health care provider.

## 2014-08-05 NOTE — Patient Instructions (Signed)
Please check your blood sugar at 11 and 1130 today to check your basal.   You are doing very well using your new insulin pump.  Just need to get rid of low blood sugars by sticking to 3 clicks for lunch.   Remember activity and food can affect your blood sugar.   ! Unit should drop your sugar about 25 mg/dl, so 3 clicks can drop it 150 mg/dl but this does not account for the carb you are eating.

## 2014-08-05 NOTE — Progress Notes (Signed)
Subjective:    Patient ID: Tyler Wilkinson, male    DOB: [birth year], 59 y.o.   MRN: [id number]  HPI Tyler Wilkinson is a 58 yo male with PMHx of HTN, CAD, Chronic Bronchitis, T2DM who presents with complaint of lesion on abdomen. Please see problem oriented assessment and plan for more information.  Review of Systems General: Denies fever, chills, fatigue Respiratory: Denies SOB, cough   Cardiovascular: Denies chest pain and palpitations.  Gastrointestinal: Denies nausea, vomiting, abdominal pain, diarrhea, constipation, blood in stool  Genitourinary: Denies dysuria, urgency, frequency Musculoskeletal: Denies myalgias, back pain Skin: Admits to lesion on upper abdomen. Denies pallor, rash and wounds.  Neurological: Admits to neuropathic pain. Denies dizziness, headaches, weakness, lightheadedness, numbness Psychiatric/Behavioral: Denies mood changes, confusion, nervousness, sleep disturbance and agitation.  Past Medical History  Diagnosis Date  . Hypertension   . Erectile dysfunction   . Dyslipidemia   . CAD (coronary artery disease)     stent RCA 2006 (other residual disease). /  nuclear 2008  no ischemia, inferolateral scar.  . Ejection fraction     EF 55%,echo, 2008, inferolateral hypokinesis,  . Myocardial infarction 2006  . Chronic bronchitis     "anytime I get sick it goes into bronchitis; probably get it q yr"  . Sleep apnea     "not wearing mask" (05/10/2014)  . Type I diabetes mellitus dx'd 1972  . Daily headache     "just recently" (05/10/2014)  . Gastroparesis    Current Outpatient Prescriptions on File Prior to Visit  Medication Sig Dispense Refill  . acetaminophen-codeine (TYLENOL #3) 300-30 MG per tablet Take 1 tablet by mouth every 4 (four) hours as needed for moderate pain. 30 tablet 0  . albuterol (PROVENTIL HFA;VENTOLIN HFA) 108 (90 BASE) MCG/ACT inhaler Inhale 2 puffs into the lungs every 6 (six) hours as needed for wheezing or shortness of breath. 1 Inhaler 2  .  amitriptyline (ELAVIL) 50 MG tablet Take 1 tablet (50 mg total) by mouth at bedtime. 30 tablet 11  . aspirin EC 81 MG tablet Take 1 tablet (81 mg total) by mouth daily.    . benazepril (LOTENSIN) 40 MG tablet Take 1 tablet (40 mg total) by mouth daily. 90 tablet 3  . Blood Glucose Monitoring Suppl (ONE TOUCH ULTRA 2) W/DEVICE KIT Use to check blood sugars up to 6 times daily 1 each 0  . carvedilol (COREG) 6.25 MG tablet Take 1 tablet (6.25 mg total) by mouth 2 (two) times daily. 60 tablet 3  . Flaxseed, Linseed, (FLAX SEED OIL PO) Take by mouth daily.      Marland Kitchen glucose blood (ONE TOUCH ULTRA TEST) test strip Use to check blood sugars 3-4 times daily. Code: E11.42. 200 each 11  . hydrochlorothiazide (HYDRODIURIL) 25 MG tablet Take 1 tablet (25 mg total) by mouth daily. 60 tablet 5  . insulin aspart (NOVOLOG) 100 UNIT/ML injection For use for with VGo 40. 40 units of basal and 2 units (1 click) with small meals, 3-5 units with larger meals 30 mL 3  . Insulin Disposable Pump (V-GO 40) KIT 2 units (1 click) with smaller meals and 3-5 units with larger meals. 1 kit 2  . insulin lispro (HUMALOG) 100 UNIT/ML injection For use for with VGo 40. 40 units of basal and 2 units (1 click) with small meals, 3-5 units with larger meals 10 mL 3  . Insulin Syringe-Needle U-100 (INSULIN SYRINGE .5CC/31GX5/16") 31G X 5/16" 0.5 ML MISC Use to inject insulin  3 times a day Dx code 250.00 100 each 12  . latanoprost (XALATAN) 0.005 % ophthalmic solution Place 1 drop into both eyes at bedtime.    Marland Kitchen levocetirizine (XYZAL) 5 MG tablet Take 1 tablet (5 mg total) by mouth every evening. 30 tablet 11  . metoCLOPramide (REGLAN) 5 MG tablet Take 1 tablet (5 mg total) by mouth 4 (four) times daily - after meals and at bedtime. 120 tablet 1  . naproxen (NAPROSYN) 500 MG tablet Take 1 tablet (500 mg total) by mouth 2 (two) times daily with a meal. 60 tablet 3  . nitroGLYCERIN (NITROSTAT) 0.4 MG SL tablet Place 1 tablet (0.4 mg total)  under the tongue every 5 (five) minutes as needed for chest pain (DO NOT USE IF YOU HAVE TAKEN VIAGRA). 25 tablet 2  . ONETOUCH DELICA LANCETS 39N MISC Use to check blood sugars up to 6 times a day. Dx code: 250.60 200 each 6  . ONETOUCH DELICA LANCETS MISC Use to check blood sugar sup to 6 times daily. Dx code 250.00 200 each 12  . rosuvastatin (CRESTOR) 10 MG tablet Take 1 tablet (10 mg total) by mouth daily. 30 tablet 11  . sildenafil (VIAGRA) 100 MG tablet Take 0.5 tablets (50 mg total) by mouth as needed for erectile dysfunction (DO NOT USE IF YOU ARE TAKING NTG). 20 tablet 1   No current facility-administered medications on file prior to visit.      Objective:   Physical Exam Filed Vitals:   08/05/14 1044  BP: 123/73  Pulse: 72  Temp: 98 F (36.7 C)  TempSrc: Oral  Height: 6' (1.829 m)  Weight: 214 lb (97.07 kg)  SpO2: 99%   General: Vital signs reviewed.  Patient is well-developed and well-nourished, in no acute distress and cooperative with exam.  Neck: Multiple skin tags present Cardiovascular: RRR, S1 normal, S2 normal, no murmurs, gallops, or rubs. Pulmonary/Chest: Clear to auscultation bilaterally, no wheezes, rales, or rhonchi. Abdominal: Soft, non-tender, non-distended, BS +. Reducible umbilical hernia, non-strangulated. Extremities: No lower extremity edema bilaterally Skin: 1 x1 cm lesion in LUQ/epigastric area, light brown/gray, raised with "stuck on" appearance. Psychiatric: Normal mood and affect. speech and behavior is normal. Cognition and memory are normal.     Assessment & Plan:   Please see problem based assessment and plan.

## 2014-09-06 ENCOUNTER — Telehealth: Payer: Self-pay | Admitting: Dietician

## 2014-09-06 NOTE — Telephone Encounter (Signed)
[facility] Vgo disposable insulin pump. CDE called [facility] ( his new DME Company as of 08/19/14) and asked them to refax an order as we have not received it as yet.   Tyler Wilkinson says he is doing well with the vgo, average for past month is in 150s. He saud he had one CBG of  39, but most others are 100-200.  I asked him to reduce his insulin to prevent this from happening as much as possible and be satisfied with blood sugars 150 rather than trying to get them to 100. He verbalized understanding.

## 2014-10-07 ENCOUNTER — Encounter: Payer: Self-pay | Admitting: Licensed Clinical Social Worker

## 2014-10-07 ENCOUNTER — Encounter: Payer: Self-pay | Admitting: Internal Medicine

## 2014-10-07 ENCOUNTER — Ambulatory Visit (INDEPENDENT_AMBULATORY_CARE_PROVIDER_SITE_OTHER): Payer: 59 | Admitting: Internal Medicine

## 2014-10-07 ENCOUNTER — Ambulatory Visit: Payer: Self-pay | Admitting: Internal Medicine

## 2014-10-07 VITALS — BP 136/65 | HR 76 | Temp 97.7°F | Ht 72.5 in | Wt 216.9 lb

## 2014-10-07 DIAGNOSIS — E1143 Type 2 diabetes mellitus with diabetic autonomic (poly)neuropathy: Secondary | ICD-10-CM

## 2014-10-07 DIAGNOSIS — M549 Dorsalgia, unspecified: Secondary | ICD-10-CM

## 2014-10-07 DIAGNOSIS — F32A Depression, unspecified: Secondary | ICD-10-CM

## 2014-10-07 DIAGNOSIS — G8929 Other chronic pain: Secondary | ICD-10-CM

## 2014-10-07 DIAGNOSIS — F329 Major depressive disorder, single episode, unspecified: Secondary | ICD-10-CM

## 2014-10-07 DIAGNOSIS — F339 Major depressive disorder, recurrent, unspecified: Secondary | ICD-10-CM | POA: Insufficient documentation

## 2014-10-07 DIAGNOSIS — Z760 Encounter for issue of repeat prescription: Secondary | ICD-10-CM

## 2014-10-07 LAB — GLUCOSE, CAPILLARY: Glucose-Capillary: 181 mg/dL — ABNORMAL HIGH (ref 70–99)

## 2014-10-07 MED ORDER — TRAMADOL HCL 50 MG PO TABS: 50.0000 mg | ORAL_TABLET | Freq: Four times a day (QID) | ORAL | Status: DC | PRN

## 2014-10-07 MED ORDER — ROSUVASTATIN CALCIUM 10 MG PO TABS: 10.0000 mg | ORAL_TABLET | Freq: Every day | ORAL | Status: DC

## 2014-10-07 MED ORDER — ALPRAZOLAM 0.5 MG PO TABS: 0.5000 mg | ORAL_TABLET | Freq: Three times a day (TID) | ORAL | Status: DC | PRN

## 2014-10-07 NOTE — Assessment & Plan Note (Signed)
This has been a long-standing problem for the patient. 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 Pt does not have any red flag symptoms today. Patient does not have any significant weakness or paresthesias. Patient is not interested in trying any other modalities for his diabetic neuropathy. -Tramadol 50 mg every 4-6 hours -Follow-up in one month for reassessment

## 2014-10-07 NOTE — Assessment & Plan Note (Signed)
Patient does describe some depressive feelings and recent loss of a friend. Has no homicidal or suicidal ideation today. Is having some trouble sleeping and feeling anxious a lot of the time. Patient has been anxious on several other occasions per chart review and was offered Xanax at some point which the patient refused but is willing to try today. -Trial of Xanax 0.5 mg -Follow-up in one month

## 2014-10-07 NOTE — Progress Notes (Signed)
Subjective:   Patient ID: Tyler Wilkinson male   DOB: 05-03-1956 59 y.o.   MRN: [id number]  HPI: Tyler Wilkinson is a 59 y.o. man with a past medical history as listed below who presents for some ongoing back pain and depression.  In terms of his back pain has been a chronic issue the patient has minimal to no relief with Tylenol and is interesting in trying something new. He has not had any weakness, saddle anesthesia, urinary or bowel incontinence. 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 The patient has stopped amitriptyline because he feels that it causes him to "think crazy." He continues to work lifting heavy objects and on concrete floors prolonged periods throughout the day which seemed to exacerbate his foot pain.  In terms of his depression he is frustrated by his chronic medical issues and has lost a good friend. He is not actively suicidal or homicidal at this visit. He is slightly tearful but has not lost interest in things that he enjoys doing but is just feeling "lonely." He continues to sleep well and perform his ADLs without limitation.  Past Medical History  Diagnosis Date  . Hypertension   . Erectile dysfunction   . Dyslipidemia   . CAD (coronary artery disease)     stent RCA 2006 (other residual disease). /  nuclear 2008  no ischemia, inferolateral scar.  . Ejection fraction     EF 55%,echo, 2008, inferolateral hypokinesis,  . Myocardial infarction 2006  . Chronic bronchitis     "anytime I get sick it goes into bronchitis; probably get it q yr"  . Sleep apnea     "not wearing mask" (05/10/2014)  . Type I diabetes mellitus dx'd 1972  . Daily headache     "just recently" (05/10/2014)  . Gastroparesis    Current Outpatient Prescriptions  Medication Sig Dispense Refill  . acetaminophen-codeine (TYLENOL #3) 300-30 MG per tablet Take 1 tablet by mouth every 4 (four) hours as  needed for moderate pain. 30 tablet 0  . albuterol (PROVENTIL HFA;VENTOLIN HFA) 108 (90 BASE) MCG/ACT inhaler Inhale 2 puffs into the lungs every 6 (six) hours as needed for wheezing or shortness of breath. 1 Inhaler 2  . ALPRAZolam (XANAX) 0.5 MG tablet Take 1 tablet (0.5 mg total) by mouth 3 (three) times daily as needed for sleep or anxiety. 30 tablet 0  . aspirin EC 81 MG tablet Take 1 tablet (81 mg total) by mouth daily.    . benazepril (LOTENSIN) 40 MG tablet Take 1 tablet (40 mg total) by mouth daily. 90 tablet 3  . Blood Glucose Monitoring Suppl (ONE TOUCH ULTRA 2) W/DEVICE KIT Use to check blood sugars up to 6 times daily 1 each 0  . carvedilol (COREG) 6.25 MG tablet Take 1 tablet (6.25 mg total) by mouth 2 (two) times daily. 60 tablet 3  . Flaxseed, Linseed, (FLAX SEED OIL PO) Take by mouth daily.      Marland Kitchen glucose blood (ONE TOUCH ULTRA TEST) test strip Use to check blood sugars 3-4 times daily. Code: E11.42. 200 each 11  . hydrochlorothiazide (HYDRODIURIL) 25 MG tablet Take 1 tablet (25 mg total) by mouth daily. 60 tablet 5  . insulin aspart (NOVOLOG) 100 UNIT/ML injection For use for with VGo 40. 40 units of basal and 2 units (1 click) with small meals, 3-5 units with larger meals 30 mL 3  .  Insulin Disposable Pump (V-GO 40) KIT 2 units (1 click) with smaller meals and 3-5 units with larger meals. 1 kit 2  . insulin lispro (HUMALOG) 100 UNIT/ML injection For use for with VGo 40. 40 units of basal and 2 units (1 click) with small meals, 3-5 units with larger meals 10 mL 3  . Insulin Syringe-Needle U-100 (INSULIN SYRINGE .5CC/31GX5/16") 31G X 5/16" 0.5 ML MISC Use to inject insulin 3 times a day Dx code 250.00 100 each 12  . latanoprost (XALATAN) 0.005 % ophthalmic solution Place 1 drop into both eyes at bedtime.    Marland Kitchen levocetirizine (XYZAL) 5 MG tablet Take 1 tablet (5 mg total) by mouth every evening. 30 tablet 11  . metoCLOPramide (REGLAN) 5 MG tablet Take 1 tablet (5 mg total) by mouth 4  (four) times daily - after meals and at bedtime. 120 tablet 1  . naproxen (NAPROSYN) 500 MG tablet Take 1 tablet (500 mg total) by mouth 2 (two) times daily with a meal. 60 tablet 3  . nitroGLYCERIN (NITROSTAT) 0.4 MG SL tablet Place 1 tablet (0.4 mg total) under the tongue every 5 (five) minutes as needed for chest pain (DO NOT USE IF YOU HAVE TAKEN VIAGRA). 25 tablet 2  . ONETOUCH DELICA LANCETS 62M MISC Use to check blood sugars up to 6 times a day. Dx code: 250.60 200 each 6  . rosuvastatin (CRESTOR) 10 MG tablet Take 1 tablet (10 mg total) by mouth daily. 30 tablet 11  . sildenafil (VIAGRA) 100 MG tablet Take 0.5 tablets (50 mg total) by mouth as needed for erectile dysfunction (DO NOT USE IF YOU ARE TAKING NTG). 20 tablet 1  . traMADol (ULTRAM) 50 MG tablet Take 1 tablet (50 mg total) by mouth every 6 (six) hours as needed. 30 tablet 0   No current facility-administered medications for this visit.   Family History  Problem Relation Age of Onset  . Hypertension Mother    History   Social History  . Marital Status: Single    Spouse Name: N/A  . Number of Children: N/A  . Years of Education: N/A   Occupational History  . 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 Smoking status: Former Smoker -- .2 years    Types: Cigarettes    Quit date: 01/26/1973  . Smokeless tobacco: Never Used  . Alcohol Use: 0.0 oz/week    0 Standard drinks or equivalent per week     Comment: Beer rarely.  . Drug Use: Yes    Special: Marijuana     Comment: 05/10/2014 "related to stomach problems; last time was ~ 1 month ago"  . Sexual Activity: Not Currently   Other Topics Concern  . None   Social History Narrative   Review of Systems: Pertinent items are noted in HPI. Objective:  Physical Exam: Filed Vitals:   10/07/14 1042  BP: 136/65  Pulse: 76  Temp: 97.7 F (36.5 C)  TempSrc: Oral  Height: 6' 0.5" (1.842 m)  Weight: 216 lb 14.4 oz (98.385 kg)    SpO2: 100%   General: sitting in chair, NAD, tearful  Cardiac: RRR, no rubs, murmurs or gallops Pulm: clear to auscultation bilaterally, moving normal volumes of air Abd: soft, nontender, nondistended, BS present Ext: warm and well perfused, no pedal edema MSK: 5/5 LE strength, normal sensation on le bilaterally, 2+ DTRs, + straight leg raise  Neuro: alert and oriented X3, cranial nerves II-XII grossly intact  Assessment & Plan:  Please see problem oriented charting  Pt discussed with Dr. Dareen Piano

## 2014-10-07 NOTE — Patient Instructions (Signed)
General Instructions:   Please try to bring all your medicines next time. This will help us keep you safe from mistakes.   Progress Toward Treatment Goals:  Treatment Goal 06/24/2014  Hemoglobin A1C improved  Blood pressure at goal  Prevent falls -    Self Care Goals & Plans:  Self Care Goal 10/07/2014  Manage my medications take my medicines as prescribed; bring my medications to every visit; refill my medications on time  Monitor my health -  Eat healthy foods drink diet soda or water instead of juice or soda; eat more vegetables; eat foods that are low in salt; eat baked foods instead of fried foods; eat fruit for snacks and desserts  Be physically active -    Home Blood Glucose Monitoring 06/24/2014  Check my blood sugar 4 times a day  When to check my blood sugar before meals; at bedtime     Care Management & Community Referrals:  Referral 10/20/2013  Referrals made for care management support none needed

## 2014-10-07 NOTE — Assessment & Plan Note (Signed)
Will follow up in 1 month. Was addressing acute issues in this visit.

## 2014-10-10 NOTE — Addendum Note (Signed)
Addended by: Neomia DearPOWERS, Shanessa Hodak E on: 10/10/2014 05:40 PM   Modules accepted: Orders

## 2014-10-11 NOTE — Progress Notes (Signed)
INTERNAL MEDICINE TEACHING ATTENDING ADDENDUM - Mckinna Demars, MD: I reviewed and discussed at the time of visit with the resident Dr. Sadek, the patient's medical history, physical examination, diagnosis and results of pertinent tests and treatment and I agree with the patient's care as documented.  

## 2014-10-11 NOTE — Progress Notes (Signed)
[name] requesting to meet with CSW to discuss disability application.  CSW met with pt following his scheduled [facility] appointment.  CSW had lengthy conversation with [name] discussing previous job experiences and current.  Pt states his current job has encouraged him to file for disability because of the continued difficulty Tyler Wilkinson his having at work.  [name] has applied for SS Disability in the distant past and was denied.  CSW encouraged [name] to speak with his HR department regarding ST/LT disability benefits from his employer.  Pt aware it would be more difficult being approved for SS Disability while he is currently working.  In addition, pt informed at times people on disability become over income for Medicaid and encouraged pt to not assume benefits are the same for everyone.  CSW also encouraged [name] to contact [employer] to determine his retirement age for Medicare, pt under the impression his full benefit retirement age is 73.  Pt provided with information to [facility] and encouraged to contact his employers human resources.  Pt denied add'l social work needs at this time.  [name] aware CSW is available to assist as needed.

## 2014-10-18 ENCOUNTER — Other Ambulatory Visit: Payer: Self-pay | Admitting: *Deleted

## 2014-10-18 ENCOUNTER — Telehealth: Payer: Self-pay | Admitting: Dietician

## 2014-10-18 DIAGNOSIS — E1143 Type 2 diabetes mellitus with diabetic autonomic (poly)neuropathy: Secondary | ICD-10-CM

## 2014-10-18 NOTE — Telephone Encounter (Signed)
Patient called and said that [employer] is telling him that  now the VGo will cost him $300 instead of $60 something or$ 80.  And he cannot afford but feels it is really helping him and asks for help getting it covered.  Called [employer] to find out what happened: insurance will not cover, they have been using a discount card and a coupon card to hlep cover it and now that is not wokring. . If his insurance covers it then the vgo discount will work. .  Per [employer]- their response from [facility] says- surgical supply or medical not covered. Called [employer] who is now his mail order supply company- they say that they informed Onalee HuaDavid that his insurance does not cover- they say his insurance denied.  Called [facility] - they say the VGo is covered, it is considered a dme - no limits, 100% after $5000.00 deductible as long as purchased from [facility] provider. She says he has not paid anyhting on his deductible.  Patient called and said he got it worked out where the VGo would only cost him 80$/month. ( [employer] coupon works when insurer does not pick up the cost and they called his pharmacy to tell them how to run it through.)

## 2014-10-19 MED ORDER — V-GO 40 KIT
PACK | Status: DC
Start: 2014-10-19 — End: 2015-07-27

## 2014-11-16 ENCOUNTER — Encounter (HOSPITAL_COMMUNITY): Payer: Self-pay | Admitting: *Deleted

## 2014-11-16 ENCOUNTER — Emergency Department (HOSPITAL_COMMUNITY): Payer: 59

## 2014-11-16 ENCOUNTER — Emergency Department (HOSPITAL_COMMUNITY): Payer: 59 | Admitting: Anesthesiology

## 2014-11-16 ENCOUNTER — Observation Stay (HOSPITAL_COMMUNITY)
Admission: EM | Admit: 2014-11-16 | Discharge: 2014-11-17 | Disposition: A | Discharge status: Home / Self Care | Payer: 59 | Attending: General Surgery | Admitting: General Surgery

## 2014-11-16 ENCOUNTER — Encounter (HOSPITAL_COMMUNITY)
Admission: EM | Disposition: A | Discharge status: Home / Self Care | Payer: Self-pay | Source: Home / Self Care | Attending: Emergency Medicine

## 2014-11-16 DIAGNOSIS — K429 Umbilical hernia without obstruction or gangrene: Secondary | ICD-10-CM | POA: Diagnosis not present

## 2014-11-16 DIAGNOSIS — F1210 Cannabis abuse, uncomplicated: Secondary | ICD-10-CM | POA: Diagnosis not present

## 2014-11-16 DIAGNOSIS — Z955 Presence of coronary angioplasty implant and graft: Secondary | ICD-10-CM | POA: Insufficient documentation

## 2014-11-16 DIAGNOSIS — Z794 Long term (current) use of insulin: Secondary | ICD-10-CM | POA: Insufficient documentation

## 2014-11-16 DIAGNOSIS — F1099 Alcohol use, unspecified with unspecified alcohol-induced disorder: Secondary | ICD-10-CM | POA: Insufficient documentation

## 2014-11-16 DIAGNOSIS — J45909 Unspecified asthma, uncomplicated: Secondary | ICD-10-CM | POA: Insufficient documentation

## 2014-11-16 DIAGNOSIS — K3580 Unspecified acute appendicitis: Principal | ICD-10-CM | POA: Insufficient documentation

## 2014-11-16 DIAGNOSIS — I10 Essential (primary) hypertension: Secondary | ICD-10-CM | POA: Diagnosis not present

## 2014-11-16 DIAGNOSIS — R1084 Generalized abdominal pain: Secondary | ICD-10-CM

## 2014-11-16 DIAGNOSIS — M549 Dorsalgia, unspecified: Secondary | ICD-10-CM | POA: Insufficient documentation

## 2014-11-16 DIAGNOSIS — R112 Nausea with vomiting, unspecified: Secondary | ICD-10-CM

## 2014-11-16 DIAGNOSIS — K3589 Other acute appendicitis without perforation or gangrene: Secondary | ICD-10-CM

## 2014-11-16 DIAGNOSIS — E1143 Type 2 diabetes mellitus with diabetic autonomic (poly)neuropathy: Secondary | ICD-10-CM | POA: Insufficient documentation

## 2014-11-16 DIAGNOSIS — G4733 Obstructive sleep apnea (adult) (pediatric): Secondary | ICD-10-CM | POA: Insufficient documentation

## 2014-11-16 DIAGNOSIS — E785 Hyperlipidemia, unspecified: Secondary | ICD-10-CM | POA: Diagnosis not present

## 2014-11-16 DIAGNOSIS — Z87891 Personal history of nicotine dependence: Secondary | ICD-10-CM | POA: Insufficient documentation

## 2014-11-16 DIAGNOSIS — I252 Old myocardial infarction: Secondary | ICD-10-CM | POA: Diagnosis not present

## 2014-11-16 DIAGNOSIS — G8929 Other chronic pain: Secondary | ICD-10-CM | POA: Diagnosis not present

## 2014-11-16 DIAGNOSIS — N400 Enlarged prostate without lower urinary tract symptoms: Secondary | ICD-10-CM | POA: Insufficient documentation

## 2014-11-16 DIAGNOSIS — I2510 Atherosclerotic heart disease of native coronary artery without angina pectoris: Secondary | ICD-10-CM | POA: Diagnosis not present

## 2014-11-16 DIAGNOSIS — K3184 Gastroparesis: Secondary | ICD-10-CM | POA: Insufficient documentation

## 2014-11-16 HISTORY — PX: UMBILICAL HERNIA REPAIR: SHX196

## 2014-11-16 HISTORY — PX: LAPAROSCOPIC APPENDECTOMY: SHX408

## 2014-11-16 HISTORY — DX: Unspecified asthma, uncomplicated: J45.909

## 2014-11-16 LAB — BASIC METABOLIC PANEL
Anion gap: 10 (ref 5–15)
BUN: 14 mg/dL (ref 6–23)
CO2: 24 mmol/L (ref 19–32)
Calcium: 9.3 mg/dL (ref 8.4–10.5)
Chloride: 101 mmol/L (ref 96–112)
Creatinine, Ser: 1.11 mg/dL (ref 0.50–1.35)
GFR, EST AFRICAN AMERICAN: 82 mL/min — AB (ref >90)
GFR, EST NON AFRICAN AMERICAN: 71 mL/min — AB (ref >90)
Glucose, Bld: 297 mg/dL — ABNORMAL HIGH (ref 70–99)
POTASSIUM: 4.1 mmol/L (ref 3.5–5.1)
Sodium: 135 mmol/L (ref 135–145)

## 2014-11-16 LAB — CBC
HCT: 43.6 % (ref 39.0–52.0)
HEMOGLOBIN: 15.5 g/dL (ref 13.0–17.0)
MCH: 28.9 pg (ref 26.0–34.0)
MCHC: 35.6 g/dL (ref 30.0–36.0)
MCV: 81.2 fL (ref 78.0–100.0)
PLATELETS: 205 10*3/uL (ref 150–400)
RBC: 5.37 MIL/uL (ref 4.22–5.81)
RDW: 12.6 % (ref 11.5–15.5)
WBC: 11.5 10*3/uL — ABNORMAL HIGH (ref 4.0–10.5)

## 2014-11-16 LAB — HEPATIC FUNCTION PANEL
ALK PHOS: 96 U/L (ref 39–117)
ALT: 39 U/L (ref 0–53)
AST: 25 U/L (ref 0–37)
Albumin: 3.8 g/dL (ref 3.5–5.2)
Bilirubin, Direct: 0.1 mg/dL (ref 0.0–0.5)
Indirect Bilirubin: 0.7 mg/dL (ref 0.3–0.9)
Total Bilirubin: 0.8 mg/dL (ref 0.3–1.2)
Total Protein: 6.3 g/dL (ref 6.0–8.3)

## 2014-11-16 LAB — LIPASE, BLOOD: LIPASE: 37 U/L (ref 11–59)

## 2014-11-16 LAB — I-STAT CG4 LACTIC ACID, ED: Lactic Acid, Venous: 0.98 mmol/L (ref 0.5–2.0)

## 2014-11-16 LAB — I-STAT TROPONIN, ED: Troponin i, poc: 0 ng/mL (ref 0.00–0.08)

## 2014-11-16 LAB — GLUCOSE, CAPILLARY: GLUCOSE-CAPILLARY: 186 mg/dL — AB (ref 70–99)

## 2014-11-16 SURGERY — APPENDECTOMY, LAPAROSCOPIC
Anesthesia: General | Site: Abdomen

## 2014-11-16 MED ORDER — ONDANSETRON HCL 4 MG/2ML IJ SOLN
4.0000 mg | Freq: Once | INTRAMUSCULAR | Status: AC
  Administered 2014-11-16: 4 mg via INTRAVENOUS
  Filled 2014-11-16: qty 2

## 2014-11-16 MED ORDER — IOHEXOL 300 MG/ML  SOLN
25.0000 mL | Freq: Once | INTRAMUSCULAR | Status: AC | PRN
  Administered 2014-11-16: 25 mL via ORAL

## 2014-11-16 MED ORDER — HYDROMORPHONE HCL 1 MG/ML IJ SOLN
INTRAMUSCULAR | Status: AC
  Administered 2014-11-16: 0.5 mg via INTRAVENOUS
  Filled 2014-11-16: qty 1

## 2014-11-16 MED ORDER — FENTANYL CITRATE 0.05 MG/ML IJ SOLN
INTRAMUSCULAR | Status: DC | PRN
  Administered 2014-11-16: 25 ug via INTRAVENOUS
  Administered 2014-11-16: 50 ug via INTRAVENOUS
  Administered 2014-11-16: 100 ug via INTRAVENOUS

## 2014-11-16 MED ORDER — KETOROLAC TROMETHAMINE 30 MG/ML IJ SOLN
30.0000 mg | Freq: Once | INTRAMUSCULAR | Status: AC | PRN
  Administered 2014-11-16: 30 mg via INTRAVENOUS

## 2014-11-16 MED ORDER — SODIUM CHLORIDE 0.9 % IV BOLUS (SEPSIS)
1000.0000 mL | Freq: Once | INTRAVENOUS | Status: AC
  Administered 2014-11-16: 1000 mL via INTRAVENOUS

## 2014-11-16 MED ORDER — BUPIVACAINE-EPINEPHRINE 0.25% -1:200000 IJ SOLN
INTRAMUSCULAR | Status: DC | PRN
  Administered 2014-11-16: 30 mL

## 2014-11-16 MED ORDER — KETOROLAC TROMETHAMINE 30 MG/ML IJ SOLN
INTRAMUSCULAR | Status: AC
  Administered 2014-11-16: 30 mg via INTRAVENOUS
  Filled 2014-11-16: qty 1

## 2014-11-16 MED ORDER — MIDAZOLAM HCL 2 MG/2ML IJ SOLN
INTRAMUSCULAR | Status: AC
  Filled 2014-11-16: qty 2

## 2014-11-16 MED ORDER — PROMETHAZINE HCL 25 MG/ML IJ SOLN: 6.2500 mg | INTRAMUSCULAR | Status: DC | PRN

## 2014-11-16 MED ORDER — MIDAZOLAM HCL 5 MG/5ML IJ SOLN
INTRAMUSCULAR | Status: DC | PRN
  Administered 2014-11-16: 2 mg via INTRAVENOUS

## 2014-11-16 MED ORDER — CEFOXITIN SODIUM 2 G IV SOLR
2.0000 g | Freq: Once | INTRAVENOUS | Status: AC
  Administered 2014-11-16: 2 g via INTRAVENOUS
  Filled 2014-11-16: qty 2

## 2014-11-16 MED ORDER — ROCURONIUM BROMIDE 100 MG/10ML IV SOLN
INTRAVENOUS | Status: DC | PRN
  Administered 2014-11-16 (×2): 10 mg via INTRAVENOUS
  Administered 2014-11-16: 30 mg via INTRAVENOUS
  Administered 2014-11-16: 10 mg via INTRAVENOUS

## 2014-11-16 MED ORDER — PROPOFOL 10 MG/ML IV BOLUS
INTRAVENOUS | Status: DC | PRN
  Administered 2014-11-16: 150 mg via INTRAVENOUS

## 2014-11-16 MED ORDER — GLYCOPYRROLATE 0.2 MG/ML IJ SOLN
INTRAMUSCULAR | Status: DC | PRN
  Administered 2014-11-16: .6 mg via INTRAVENOUS

## 2014-11-16 MED ORDER — LACTATED RINGERS IV SOLN
INTRAVENOUS | Status: DC | PRN
  Administered 2014-11-16: 21:00:00 via INTRAVENOUS

## 2014-11-16 MED ORDER — NEOSTIGMINE METHYLSULFATE 10 MG/10ML IV SOLN
INTRAVENOUS | Status: DC | PRN
  Administered 2014-11-16: 4 mg via INTRAVENOUS

## 2014-11-16 MED ORDER — BUPIVACAINE-EPINEPHRINE (PF) 0.25% -1:200000 IJ SOLN
INTRAMUSCULAR | Status: AC
  Filled 2014-11-16: qty 30

## 2014-11-16 MED ORDER — 0.9 % SODIUM CHLORIDE (POUR BTL) OPTIME
TOPICAL | Status: DC | PRN
  Administered 2014-11-16: 1000 mL

## 2014-11-16 MED ORDER — HYDROMORPHONE HCL 1 MG/ML IJ SOLN
1.0000 mg | Freq: Once | INTRAMUSCULAR | Status: AC
  Administered 2014-11-16: 1 mg via INTRAVENOUS
  Filled 2014-11-16: qty 1

## 2014-11-16 MED ORDER — SUCCINYLCHOLINE CHLORIDE 20 MG/ML IJ SOLN
INTRAMUSCULAR | Status: DC | PRN
  Administered 2014-11-16: 120 mg via INTRAVENOUS

## 2014-11-16 MED ORDER — HYDROMORPHONE HCL 1 MG/ML IJ SOLN
0.5000 mg | Freq: Once | INTRAMUSCULAR | Status: AC
  Administered 2014-11-16: 0.5 mg via INTRAVENOUS
  Filled 2014-11-16: qty 1

## 2014-11-16 MED ORDER — ONDANSETRON HCL 4 MG/2ML IJ SOLN
INTRAMUSCULAR | Status: DC | PRN
  Administered 2014-11-16: 4 mg via INTRAVENOUS

## 2014-11-16 MED ORDER — SODIUM CHLORIDE 0.9 % IR SOLN
Status: DC | PRN
  Administered 2014-11-16: 1000 mL

## 2014-11-16 MED ORDER — LIDOCAINE HCL (CARDIAC) 20 MG/ML IV SOLN
INTRAVENOUS | Status: DC | PRN
  Administered 2014-11-16: 80 mg via INTRAVENOUS

## 2014-11-16 MED ORDER — IOHEXOL 300 MG/ML  SOLN
100.0000 mL | Freq: Once | INTRAMUSCULAR | Status: AC | PRN
  Administered 2014-11-16: 100 mL via INTRAVENOUS

## 2014-11-16 MED ORDER — FENTANYL CITRATE 0.05 MG/ML IJ SOLN
INTRAMUSCULAR | Status: AC
  Filled 2014-11-16: qty 5

## 2014-11-16 MED ORDER — SODIUM CHLORIDE 0.9 % IV SOLN
INTRAVENOUS | Status: DC
  Administered 2014-11-16 (×2): via INTRAVENOUS

## 2014-11-16 MED ORDER — HYDROMORPHONE HCL 1 MG/ML IJ SOLN
0.2500 mg | INTRAMUSCULAR | Status: DC | PRN
  Administered 2014-11-16: 0.5 mg via INTRAVENOUS

## 2014-11-16 SURGICAL SUPPLY — 65 items
APPLIER CLIP ROT 10 11.4 M/L (STAPLE)
BENZOIN TINCTURE PRP APPL 2/3 (GAUZE/BANDAGES/DRESSINGS) IMPLANT
BLADE SURG ROTATE 9660 (MISCELLANEOUS) ×2 IMPLANT
CANISTER SUCTION 2500CC (MISCELLANEOUS) ×2 IMPLANT
CHLORAPREP W/TINT 26ML (MISCELLANEOUS) ×2 IMPLANT
CLIP APPLIE ROT 10 11.4 M/L (STAPLE) IMPLANT
COVER SURGICAL LIGHT HANDLE (MISCELLANEOUS) ×2 IMPLANT
CUTTER FLEX LINEAR 45M (STAPLE) ×2 IMPLANT
DECANTER SPIKE VIAL GLASS SM (MISCELLANEOUS) ×2 IMPLANT
DERMABOND ADHESIVE PROPEN (GAUZE/BANDAGES/DRESSINGS) ×1
DERMABOND ADVANCED .7 DNX6 (GAUZE/BANDAGES/DRESSINGS) ×1 IMPLANT
DRAPE LAPAROSCOPIC ABDOMINAL (DRAPES) ×2 IMPLANT
DRAPE PED LAPAROTOMY (DRAPES) ×2 IMPLANT
DRAPE UTILITY XL STRL (DRAPES) ×4 IMPLANT
ELECT CAUTERY BLADE 6.4 (BLADE) ×4 IMPLANT
ELECT REM PT RETURN 9FT ADLT (ELECTROSURGICAL) ×2
ELECTRODE REM PT RTRN 9FT ADLT (ELECTROSURGICAL) ×1 IMPLANT
ENDOLOOP SUT PDS II  0 18 (SUTURE)
ENDOLOOP SUT PDS II 0 18 (SUTURE) IMPLANT
GAUZE SPONGE 2X2 8PLY STRL LF (GAUZE/BANDAGES/DRESSINGS) IMPLANT
GLOVE BIOGEL M STRL SZ7.5 (GLOVE) ×2 IMPLANT
GLOVE BIOGEL PI IND STRL 7.5 (GLOVE) ×1 IMPLANT
GLOVE BIOGEL PI IND STRL 8 (GLOVE) ×2 IMPLANT
GLOVE BIOGEL PI INDICATOR 7.5 (GLOVE) ×1
GLOVE BIOGEL PI INDICATOR 8 (GLOVE) ×2
GLOVE SURG SS PI 7.5 STRL IVOR (GLOVE) ×2 IMPLANT
GOWN STRL REUS W/ TWL LRG LVL3 (GOWN DISPOSABLE) ×2 IMPLANT
GOWN STRL REUS W/ TWL XL LVL3 (GOWN DISPOSABLE) ×1 IMPLANT
GOWN STRL REUS W/TWL LRG LVL3 (GOWN DISPOSABLE) ×2
GOWN STRL REUS W/TWL XL LVL3 (GOWN DISPOSABLE) ×1
KIT BASIN OR (CUSTOM PROCEDURE TRAY) ×2 IMPLANT
KIT ROOM TURNOVER OR (KITS) ×2 IMPLANT
LIQUID BAND (GAUZE/BANDAGES/DRESSINGS) ×2 IMPLANT
MARKER SKIN DUAL TIP RULER LAB (MISCELLANEOUS) ×2 IMPLANT
NEEDLE HYPO 25GX1X1/2 BEV (NEEDLE) ×2 IMPLANT
NS IRRIG 1000ML POUR BTL (IV SOLUTION) ×2 IMPLANT
PACK SURGICAL SETUP 50X90 (CUSTOM PROCEDURE TRAY) ×2 IMPLANT
PAD ARMBOARD 7.5X6 YLW CONV (MISCELLANEOUS) ×4 IMPLANT
PENCIL BUTTON HOLSTER BLD 10FT (ELECTRODE) ×4 IMPLANT
POUCH SPECIMEN RETRIEVAL 10MM (ENDOMECHANICALS) ×2 IMPLANT
RELOAD 45 VASCULAR/THIN (ENDOMECHANICALS) ×4 IMPLANT
RELOAD STAPLE TA45 3.5 REG BLU (ENDOMECHANICALS) IMPLANT
SCALPEL HARMONIC ACE (MISCELLANEOUS) ×2 IMPLANT
SCISSORS LAP 5X35 DISP (ENDOMECHANICALS) IMPLANT
SET IRRIG TUBING LAPAROSCOPIC (IRRIGATION / IRRIGATOR) ×4 IMPLANT
SPECIMEN JAR SMALL (MISCELLANEOUS) ×2 IMPLANT
SPONGE GAUZE 2X2 STER 10/PKG (GAUZE/BANDAGES/DRESSINGS)
SPONGE LAP 18X18 X RAY DECT (DISPOSABLE) ×2 IMPLANT
SUT MNCRL AB 4-0 PS2 18 (SUTURE) ×2 IMPLANT
SUT NOVA 1 T20/GS 25DT (SUTURE) ×2 IMPLANT
SUT NOVA NAB DX-16 0-1 5-0 T12 (SUTURE) ×2 IMPLANT
SUT VIC AB 3-0 SH 18 (SUTURE) ×4 IMPLANT
SUT VICRYL 0 UR6 27IN ABS (SUTURE) ×2 IMPLANT
SYR BULB 3OZ (MISCELLANEOUS) ×2 IMPLANT
SYR CONTROL 10ML LL (SYRINGE) ×2 IMPLANT
TOWEL OR 17X24 6PK STRL BLUE (TOWEL DISPOSABLE) ×2 IMPLANT
TOWEL OR 17X26 10 PK STRL BLUE (TOWEL DISPOSABLE) ×2 IMPLANT
TRAY FOLEY CATH 16FR SILVER (SET/KITS/TRAYS/PACK) ×2 IMPLANT
TRAY LAPAROSCOPIC (CUSTOM PROCEDURE TRAY) ×2 IMPLANT
TROCAR XCEL BLADELESS 5X75MML (TROCAR) ×4 IMPLANT
TROCAR XCEL BLUNT TIP 100MML (ENDOMECHANICALS) ×2 IMPLANT
TUBE CONNECTING 12X1/4 (SUCTIONS) IMPLANT
TUBING INSUFFLATION (TUBING) ×2 IMPLANT
WATER STERILE IRR 1000ML POUR (IV SOLUTION) IMPLANT
YANKAUER SUCT BULB TIP NO VENT (SUCTIONS) IMPLANT

## 2014-11-16 NOTE — Anesthesia Preprocedure Evaluation (Signed)
Anesthesia Evaluation  Patient identified by MRN, date of birth, ID band Patient awake    Reviewed: Allergy & Precautions, NPO status , Patient's Chart, lab work & pertinent test results  Airway Mallampati: II  TM Distance: >3 FB Neck ROM: Full    Dental no notable dental hx.    Pulmonary neg pulmonary ROS, former smoker,  breath sounds clear to auscultation  Pulmonary exam normal       Cardiovascular hypertension, + CAD, + Past MI and + Cardiac Stents Rhythm:Regular Rate:Normal  Ejection fraction  EF 55%,echo, 2008, inferolateral hypokinesis, Myocardial infarction 2006     Neuro/Psych negative neurological ROS  negative psych ROS   GI/Hepatic negative GI ROS, Neg liver ROS,   Endo/Other  diabetes, Type 1  Renal/GU negative Renal ROS  negative genitourinary   Musculoskeletal negative musculoskeletal ROS (+)   Abdominal   Peds negative pediatric ROS (+)  Hematology negative hematology ROS (+)   Anesthesia Other Findings   Reproductive/Obstetrics negative OB ROS                             Anesthesia Physical Anesthesia Plan  ASA: III and emergent  Anesthesia Plan: General   Post-op Pain Management:    Induction: Intravenous, Rapid sequence and Cricoid pressure planned  Airway Management Planned: Oral ETT  Additional Equipment:   Intra-op Plan:   Post-operative Plan: Extubation in OR  Informed Consent: I have reviewed the patients History and Physical, chart, labs and discussed the procedure including the risks, benefits and alternatives for the proposed anesthesia with the patient or authorized representative who has indicated his/her understanding and acceptance.   Dental advisory given  Plan Discussed with: CRNA and Surgeon  Anesthesia Plan Comments:         Anesthesia Quick Evaluation

## 2014-11-16 NOTE — ED Provider Notes (Signed)
CSN: [id number]     Arrival date & time 11/16/14  1227 History   First MD Initiated Contact with Patient 11/16/14 1255     Chief Complaint  Patient presents with  . Abdominal Pain  . Chest Pain     (Consider location/radiation/quality/duration/timing/severity/associated sxs/prior Treatment) HPI Comments: 59 year old male with history of abdominal hernia, gastroparesis, type 2 diabetes with neuropathy, CAD with stent on aspirin, high blood pressure presents with abdominal pain with mild radiation the back and into epigastric region with recurrent vomiting and decreased appetite since earlier today. Patient's had mild symptoms with gastroparesis however this is more significant in the pain is more significant. No significant chest pain or shortness of breath. This is different than his cardiac etiology in the past. No urinary symptoms, decreased stools and decreased passing gas today. Nothing is improved his symptoms.  Patient is a 59 y.o. male presenting with abdominal pain and chest pain. The history is provided by the patient.  Abdominal Pain Associated symptoms: chest pain, nausea and vomiting   Associated symptoms: no chills, no dysuria, no fever and no shortness of breath   Chest Pain Associated symptoms: abdominal pain, nausea and vomiting   Associated symptoms: no back pain, no fever, no headache and no shortness of breath     Past Medical History  Diagnosis Date  . Hypertension   . Erectile dysfunction   . Dyslipidemia   . CAD (coronary artery disease)     stent RCA 2006 (other residual disease). /  nuclear 2008  no ischemia, inferolateral scar.  . Ejection fraction     EF 55%,echo, 2008, inferolateral hypokinesis,  . Myocardial infarction 2006  . Chronic bronchitis     "anytime I get sick it goes into bronchitis; probably get it q yr"  . Sleep apnea     "not wearing mask" (05/10/2014)  . Type I diabetes mellitus dx'd 1972  . Daily headache     "just recently" (05/10/2014)   . Gastroparesis    Past Surgical History  Procedure Laterality Date  . Coronary angioplasty with stent placement  ~ 2006    "1"   Family History  Problem Relation Age of Onset  . Hypertension Mother    History  Substance Use Topics  . Smoking status: Former Smoker -- .2 years    Types: Cigarettes    Quit date: 01/26/1973  . Smokeless tobacco: Never Used  . Alcohol Use: 0.0 oz/week    0 Standard drinks or equivalent per week     Comment: Beer rarely.    Review of Systems  Constitutional: Positive for appetite change. Negative for fever and chills.  HENT: Negative for congestion.   Eyes: Negative for visual disturbance.  Respiratory: Negative for shortness of breath.   Cardiovascular: Positive for chest pain.  Gastrointestinal: Positive for nausea, vomiting and abdominal pain.  Genitourinary: Negative for dysuria and flank pain.  Musculoskeletal: Negative for back pain, neck pain and neck stiffness.  Skin: Negative for rash.  Neurological: Negative for light-headedness and headaches.      Allergies  Review of patient's allergies indicates no known allergies.  Home Medications   Prior to Admission medications   Medication Sig Start Date End Date Taking? Authorizing Provider  ALPRAZolam Duanne Moron) 0.5 MG tablet Take 1 tablet (0.5 mg total) by mouth 3 (three) times daily as needed for sleep or anxiety. 10/07/14 10/07/15 Yes Jerrye Noble, MD  Arginine 500 MG CAPS Take 500 mg by mouth daily.   Yes Historical Provider,  MD  aspirin EC 81 MG tablet Take 1 tablet (81 mg total) by mouth daily. 08/05/14  Yes Alexa Sherral Hammers, MD  benazepril (LOTENSIN) 40 MG tablet Take 1 tablet (40 mg total) by mouth daily. 08/05/14 08/04/15 Yes Alexa Sherral Hammers, MD  Blood Glucose Monitoring Suppl (ONE TOUCH ULTRA 2) W/DEVICE KIT Use to check blood sugars up to 6 times daily 08/05/14  Yes Alexa Sherral Hammers, MD  carvedilol (COREG) 6.25 MG tablet Take 1 tablet (6.25 mg total) by mouth 2 (two)  times daily. 08/05/14  Yes Alexa Sherral Hammers, MD  Flaxseed, Linseed, (FLAX SEED OIL PO) Take by mouth daily.     Yes Historical Provider, MD  glucose blood (ONE TOUCH ULTRA TEST) test strip Use to check blood sugars 3-4 times daily. Code: E11.42. 06/09/14  Yes Langley Gauss Moding, MD  hydrochlorothiazide (HYDRODIURIL) 25 MG tablet Take 1 tablet (25 mg total) by mouth daily. 08/05/14  Yes Alexa Sherral Hammers, MD  Insulin Disposable Pump (V-GO 40) KIT 2 units (1 click) with smaller meals and 3-5 units with larger meals. 10/19/14  Yes Langley Gauss Moding, MD  insulin lispro (HUMALOG) 100 UNIT/ML injection For use for with VGo 40. 40 units of basal and 2 units (1 click) with small meals, 3-5 units with larger meals 08/05/14 08/05/15 Yes Alexa Sherral Hammers, MD  Insulin Syringe-Needle U-100 (INSULIN SYRINGE .5CC/31GX5/16") 31G X 5/16" 0.5 ML MISC Use to inject insulin 3 times a day Dx code 250.00 02/18/11  Yes Trish Fountain, MD  latanoprost (XALATAN) 0.005 % ophthalmic solution Place 1 drop into both eyes at bedtime. 06/01/11  Yes Historical Provider, MD  levocetirizine (XYZAL) 5 MG tablet Take 1 tablet (5 mg total) by mouth every evening. 08/05/14  Yes Alexa Sherral Hammers, MD  ONETOUCH DELICA LANCETS 64Q MISC Use to check blood sugars up to 6 times a day. Dx code: 250.60 08/05/14  Yes Alexa Sherral Hammers, MD  rosuvastatin (CRESTOR) 10 MG tablet Take 1 tablet (10 mg total) by mouth daily. 10/07/14  Yes Jerrye Noble, MD  sildenafil (VIAGRA) 100 MG tablet Take 0.5 tablets (50 mg total) by mouth as needed for erectile dysfunction (DO NOT USE IF YOU ARE TAKING NTG). Patient taking differently: Take 50 mg by mouth at bedtime as needed for erectile dysfunction (DO NOT USE IF YOU ARE TAKING NTG).  08/05/14 08/05/15 Yes Alexa Sherral Hammers, MD  VITAMIN D, CHOLECALCIFEROL, PO Take 1 capsule by mouth daily.   Yes Historical Provider, MD  acetaminophen-codeine (TYLENOL #3) 300-30 MG per tablet Take 1 tablet by mouth every 4  (four) hours as needed for moderate pain. 10/20/13   Lucious Groves, DO  albuterol (PROVENTIL HFA;VENTOLIN HFA) 108 (90 BASE) MCG/ACT inhaler Inhale 2 puffs into the lungs every 6 (six) hours as needed for wheezing or shortness of breath. 10/20/13   Lucious Groves, DO  insulin aspart (NOVOLOG) 100 UNIT/ML injection For use for with VGo 40. 40 units of basal and 2 units (1 click) with small meals, 3-5 units with larger meals 08/05/14 08/05/15  Alexa Sherral Hammers, MD  metoCLOPramide (REGLAN) 5 MG tablet Take 1 tablet (5 mg total) by mouth 4 (four) times daily - after meals and at bedtime. 03/20/12 02/11/14  Trish Fountain, MD  naproxen (NAPROSYN) 500 MG tablet Take 1 tablet (500 mg total) by mouth 2 (two) times daily with a meal. 03/12/13   Ricke Hey, MD  nitroGLYCERIN (NITROSTAT) 0.4 MG SL tablet Place 1 tablet (0.4 mg  total) under the tongue every 5 (five) minutes as needed for chest pain (DO NOT USE IF YOU HAVE TAKEN VIAGRA). 08/05/14   Alexa Sherral Hammers, MD  traMADol (ULTRAM) 50 MG tablet Take 1 tablet (50 mg total) by mouth every 6 (six) hours as needed. 10/07/14   Jerrye Noble, MD   BP 111/55 mmHg  Pulse 56  Temp(Src) 97.6 F (36.4 C)  Resp 16  Ht 6' (1.829 m)  Wt 215 lb (97.523 kg)  BMI 29.15 kg/m2  SpO2 95% Physical Exam  Constitutional: He is oriented to person, place, and time. He appears well-developed and well-nourished.  HENT:  Head: Normocephalic and atraumatic.  Dry mucous membranes  Eyes: Conjunctivae are normal. Right eye exhibits no discharge. Left eye exhibits no discharge.  Neck: Normal range of motion. Neck supple. No tracheal deviation present.  Cardiovascular: Normal rate and regular rhythm.   Pulmonary/Chest: Effort normal and breath sounds normal.  Abdominal: Soft. He exhibits no distension. There is tenderness (central and epigastric). There is no guarding.  Musculoskeletal: He exhibits no edema.  Neurological: He is alert and oriented to person, place,  and time.  Skin: Skin is warm. No rash noted.  Psychiatric: He has a normal mood and affect.  Nursing note and vitals reviewed.   ED Course  Procedures (including critical care time) Labs Review Labs Reviewed  BASIC METABOLIC PANEL - Abnormal; Notable for the following:    Glucose, Bld 297 (*)    GFR calc non Af Amer 71 (*)    GFR calc Af Amer 82 (*)    All other components within normal limits  CBC - Abnormal; Notable for the following:    WBC 11.5 (*)    All other components within normal limits  HEPATIC FUNCTION PANEL  LIPASE, BLOOD  I-STAT TROPOININ, ED  I-STAT CG4 LACTIC ACID, ED    Imaging Review No results found.   EKG Interpretation   Date/Time:  Wednesday November 16 2014 12:31:17 EDT Ventricular Rate:  63 PR Interval:  178 QRS Duration: 92 QT Interval:  378 QTC Calculation: 386 R Axis:   65 Text Interpretation:  Normal sinus rhythm Normal ECG Confirmed by Lanea Vankirk   MD, Amala Petion ([id number]) on 11/16/2014 12:56:54 PM      MDM   Final diagnoses:  Abdominal pain, generalized  Non-intractable vomiting with nausea, vomiting of unspecified type   Patient presents with significant abdominal pain and recurrent vomiting and nausea worse than his normal gastroparesis. With cardiac history cardiac screen was done in triage however clinical suspicion for bowel obstruction versus colitis/pancreatitis versus other abdominal etiology. IV fluids pain meds and antiemetics given. CT scan pending  Patient improved on reassessment moderate pain, repeat fluid bolus and second pain meds given. Patient finishing up contrast will be going to CT scan shortly. Patient's care be signed out tacking physician to follow-up CT scan results for final disposition.   Medications  sodium chloride 0.9 % bolus 1,000 mL (not administered)  HYDROmorphone (DILAUDID) injection 0.5 mg (not administered)  HYDROmorphone (DILAUDID) injection 1 mg (1 mg Intravenous Given 11/16/14 1343)  sodium chloride 0.9 %  bolus 1,000 mL (1,000 mLs Intravenous New Bag/Given 11/16/14 1344)  ondansetron (ZOFRAN) injection 4 mg (4 mg Intravenous Given 11/16/14 1403)  iohexol (OMNIPAQUE) 300 MG/ML solution 25 mL (25 mLs Oral Contrast Given 11/16/14 1417)    Filed Vitals:   11/16/14 1233 11/16/14 1315 11/16/14 1345  BP: 104/68 140/71 111/55  Pulse: 70 64 56  Temp: 97.6 F (  36.4 C)    Resp: 18 13 16   Height: 6' (1.829 m)    Weight: 215 lb (97.523 kg)    SpO2: 97% 95% 95%    Final diagnoses:  Abdominal pain, generalized  Non-intractable vomiting with nausea, vomiting of unspecified type      Elnora Morrison, MD 11/16/14 1558

## 2014-11-16 NOTE — ED Provider Notes (Signed)
Complains of lower abdominal pain onset today multiple doses of vomiting. Pain transiently improved after treatment here. Has recurred since return from CT scan. On exam appears uncomfortable abdomen distended, umbilical hernia, easily reducible. Tender over lower quadrants bilaterally. Genitalia normal male. Spoke with Dr. Andrey CampanileWilson plan patient to be admitted for surgery. 6:35 PM pain improved after treatment with intravenous hydromorphone Prophylactic intravenous antibiotics ordered. Diagnosis #1 acute appendicitis #2 hyperglycemia   Doug SouSam Jakobee Brackins, MD 11/16/14 [phone]

## 2014-11-16 NOTE — Op Note (Signed)
DESMOND SZABO [id number] Jan 14, 1956 11/16/2014  Laparoscopic Appendectomy Open primary repair of umbilical hernia  Indications: The patient presented with a history of central & right-sided abdominal pain. A CT revealed findings consistent with acute appendicitis. He also had a 2cm umbilical hernia. Please see h&p  Pre-operative Diagnosis: acute appendicitis, umbilical hernia  Post-operative Diagnosis: acute gangrenous appendicitis, umbilical hernia  Surgeon: Atilano Ina   Assistants: none  Anesthesia: General endotracheal anesthesia  ASA Class: 3, E  Procedure Details  The patient was seen again in the Holding Room. The risks, benefits, complications, treatment options, and expected outcomes were discussed with the patient and/or family. The possibilities of perforation of viscus, bleeding, recurrent infection, the need for additional procedures, failure to diagnose a condition, and creating a complication requiring transfusion or operation were discussed. There was concurrence with the proposed plan and informed consent was obtained. The site of surgery was properly noted. The patient was taken to Operating Room, identified as RECARDO LINN and the procedure verified as Appendectomy. A Time Out was held and the above information confirmed.  The patient was placed in the supine position and general anesthesia was induced, along with placement of orogastric tube, SCDs, and a Foley catheter. The abdomen was prepped and draped in standard fashion. 0.250% Marcaine with epinephrine was used to anesthetize the skin. A curvilinear infraumbilical incision was created. Dissection was carried down to the hernia sac located above the fascia and was mobilized from surrounding structures.It just contained fat.  Intact fascia was identified circumferentially around the defect. Skin and soft tissue was mobilized from the surface of the fascia in a circumferential manner. A finger sweep was performed  underneath the fascia to ensure there were no adhesions to the anterior abdominal wall.  A pursestring suture was passed around the incision with a 0 Vicryl.  A 12mm Hasson was introduced into the abdomen and the tails of the suture were used to hold the Hasson in place.   The pneumoperitoneum was then established to steady pressure of 15 mmHg.  Additional 5 mm cannulas then placed in the left lower quadrant of the abdomen and the suprapubic region under direct visualization. A careful evaluation of the entire abdomen was carried out. The patient was placed in Trendelenburg and left lateral decubitus position. The small intestines were retracted in the cephalad and left lateral direction away from the pelvis and right lower quadrant. The patient was found to have an inflammed retrocecal appendix that was extending behind the cecum in the paracolic gutter. There was no evidence of perforation.  The mesoappendix was somewhat tethered to cecal mesentery so I decided to dissect out the appendiceal base first since it was more readily accessible. Using the [facility] grasper, I was able to create a window at the base of the appendix. The appendix was divided at its base using an endo-GIA stapler with a white load. No appendiceal stump was left in place. The mesoappendix was carefully dissected. The appendix was was skeletonized with the harmonic scalpel.   The appendix was placed within Endocatch bag through the umbilical port.  There was no evidence of bleeding, leakage, or complication after division of the appendix. Irrigation was also performed and irrigate suctioned from the abdomen as well.  The endocatch bag with the appendix was then removed. The pursestring suture at the umbilical fascia was removed. Since this was a contaminated case no mesh was used. The fascia was then closed  with 4 interrupted 1-novafil sutures.  The closure  was viewed laparoscopically. There was no residual palpable fascial defect.  The cavity was irrigated and additional local was infiltrated in the subcutaneous tissue and fascia. The umbilical stalk was then tacked back down to the fascia with two 3-0 vicryl sutures. Hemostasis was confirmed. The soft tissue was irrigated and closed in layers with inverted interrupted 3-0 vicryl sutures for the deep dermis. The skin incision was closed with a 4-0 monocryl subcuticular closure.. Liquid band exceed was applied to the skin incisions.  Instrument, sponge, and needle counts were correct at the conclusion of the case.   Findings: The appendix was found to be inflamed. There were signs of necrosis in the midportion.  There was not perforation. There was not abscess formation. 2cm umbilical fascia defect.   Estimated Blood Loss:  Minimal         Drains: none         Specimens: appendix         Complications:  None; patient tolerated the procedure well.         Disposition: PACU - hemodynamically stable.         Condition: stable  Mary SellaEric M. Andrey CampanileWilson, MD, FACS General, Bariatric, & Minimally Invasive Surgery [facility] [place]

## 2014-11-16 NOTE — Transfer of Care (Signed)
Immediate Anesthesia Transfer of Care Note  Patient: Tyler Wilkinson  Procedure(s) Performed: Procedure(s): APPENDECTOMY LAPAROSCOPIC (N/A) OPEN PRIMARY UMBILICAL HERNIA REPAIR  (N/A)  Patient Location: PACU  Anesthesia Type:General  Level of Consciousness: awake and alert   Airway & Oxygen Therapy: Patient Spontanous Breathing and Patient connected to face mask oxygen  Post-op Assessment: Report given to RN and Post -op Vital signs reviewed and stable  Post vital signs: Reviewed and stable  Last Vitals:  Filed Vitals:   11/16/14 1827  BP: 165/77  Pulse: 82  Temp:   Resp: 18    Complications: No apparent anesthesia complications

## 2014-11-16 NOTE — ED Notes (Signed)
Pt states that he has lower abdominal pain that radiates to his chest. Pt states hat he has a hernia and that is where the pain is starting. Pt reports nausea and dry heaves.

## 2014-11-16 NOTE — H&P (Signed)
Tyler Wilkinson is an 59 y.o. male.   Chief Complaint: abd pain HPI: 59 yo WM with IDDM, HTN, CAD, OSA, gastroparesis awoke this am with diarrhea and some abdominal discomfort. On way to work, abd pain worsened. Felt nauseated but didn't vomit. Left after short time at work and came to ED for worsening periumbilical/rt sided pain. No prior symptoms. Not typical gastroparesis symptoms. No fever/chills. CT showed early acute appendicitis.   Past Medical History  Diagnosis Date  . Hypertension   . Erectile dysfunction   . Dyslipidemia   . CAD (coronary artery disease)     stent RCA 2006 (other residual disease). /  nuclear 2008  no ischemia, inferolateral scar.  . Ejection fraction     EF 55%,echo, 2008, inferolateral hypokinesis,  . Myocardial infarction 2006  . Chronic bronchitis     "anytime I get sick it goes into bronchitis; probably get it q yr"  . Sleep apnea     "not wearing mask" (05/10/2014)  . Type I diabetes mellitus dx'd 1972  . Daily headache     "just recently" (05/10/2014)  . Gastroparesis   . Asthma     Past Surgical History  Procedure Laterality Date  . Coronary angioplasty with stent placement  ~ 2006    "1"    Family History  Problem Relation Age of Onset  . Hypertension Mother    Social History:  reports that he quit smoking about 41 years ago. His smoking use included Cigarettes. He quit after .2 years of use. He has never used smokeless tobacco. He reports that he drinks alcohol. He reports that he uses illicit drugs (Marijuana).  Allergies: No Known Allergies   (Not in a hospital admission)  Results for orders placed or performed during the hospital encounter of 11/16/14 (from the past 48 hour(s))  Basic metabolic panel     Status: Abnormal   Collection Time: 11/16/14 12:42 PM  Result Value Ref Range   Sodium 135 135 - 145 mmol/L   Potassium 4.1 3.5 - 5.1 mmol/L   Chloride 101 96 - 112 mmol/L   CO2 24 19 - 32 mmol/L   Glucose, Bld 297 (H) 70 - 99  mg/dL   BUN 14 6 - 23 mg/dL   Creatinine, Ser 1.11 0.50 - 1.35 mg/dL   Calcium 9.3 8.4 - 10.5 mg/dL   GFR calc non Af Amer 71 (L) >90 mL/min   GFR calc Af Amer 82 (L) >90 mL/min    Comment: (NOTE) The eGFR has been calculated using the CKD EPI equation. This calculation has not been validated in all clinical situations. eGFR's persistently <90 mL/min signify possible Chronic Kidney Disease.    Anion gap 10 5 - 15  CBC     Status: Abnormal   Collection Time: 11/16/14 12:42 PM  Result Value Ref Range   WBC 11.5 (H) 4.0 - 10.5 K/uL   RBC 5.37 4.22 - 5.81 MIL/uL   Hemoglobin 15.5 13.0 - 17.0 g/dL   HCT 43.6 39.0 - 52.0 %   MCV 81.2 78.0 - 100.0 fL   MCH 28.9 26.0 - 34.0 pg   MCHC 35.6 30.0 - 36.0 g/dL   RDW 12.6 11.5 - 15.5 %   Platelets 205 150 - 400 K/uL  I-stat troponin, ED (not at [facility])     Status: None   Collection Time: 11/16/14 12:55 PM  Result Value Ref Range   Troponin i, poc 0.00 0.00 - 0.08 ng/mL   Comment 3  Comment: Due to the release kinetics of cTnI, a negative result within the first hours of the onset of symptoms does not rule out myocardial infarction with certainty. If myocardial infarction is still suspected, repeat the test at appropriate intervals.   I-Stat CG4 Lactic Acid, ED     Status: None   Collection Time: 11/16/14  2:17 PM  Result Value Ref Range   Lactic Acid, Venous 0.98 0.5 - 2.0 mmol/L  Hepatic function panel     Status: None   Collection Time: 11/16/14  3:40 PM  Result Value Ref Range   Total Protein 6.3 6.0 - 8.3 g/dL   Albumin 3.8 3.5 - 5.2 g/dL   AST 25 0 - 37 U/L   ALT 39 0 - 53 U/L   Alkaline Phosphatase 96 39 - 117 U/L   Total Bilirubin 0.8 0.3 - 1.2 mg/dL   Bilirubin, Direct 0.1 0.0 - 0.5 mg/dL   Indirect Bilirubin 0.7 0.3 - 0.9 mg/dL  Lipase, blood     Status: None   Collection Time: 11/16/14  3:40 PM  Result Value Ref Range   Lipase 37 11 - 59 U/L   Ct Abdomen Pelvis W Contrast  11/16/2014   CLINICAL DATA:   Abdominal pain, radiating into the back and epigastric region. Recurrent vomiting. Decreased appetite. Symptoms for 1 day.  EXAM: CT ABDOMEN AND PELVIS WITH CONTRAST  TECHNIQUE: Multidetector CT imaging of the abdomen and pelvis was performed using the standard protocol following bolus administration of intravenous contrast.  CONTRAST:  188m OMNIPAQUE IOHEXOL 300 MG/ML  SOLN  COMPARISON:  None.  FINDINGS: Mild dependent atelectasis in the lung bases.  The appendix is dilated measuring 13 mm with appendicular without the base. There is mild surrounding periappendiceal inflammatory change. Findings are consistent with uncomplicated acute appendicitis. No perforation or abscess.  The liver, gallbladder, and adrenal glands are normal. Spleen at the upper limits of normal in size measuring 13.4 cm, no focal abnormality. Mild pancreatic fatty atrophy without peripancreatic inflammatory change or ductal dilatation.  Kidneys demonstrate symmetric enhancement and excretion. There is no hydronephrosis. No focal renal abnormality.  The stomach is physiologically distended with contrast. There are no dilated or thickened small bowel loops. Small volume of colonic stool, no colonic wall thickening.  Abdominal aorta is normal in caliber with mild atherosclerosis. No retroperitoneal adenopathy. Small fat containing umbilical hernia.  Within the pelvis the urinary bladder is mildly distended. Prostate gland appears enlarged measuring 6.3 x 4.9 cm. All no pelvic free fluid.  There are no acute or suspicious osseous abnormalities. Scattered bone islands including the left acetabulum.  IMPRESSION: 1. Findings consistent with uncomplicated acute appendicitis. 2. Incidental findings of enlarged prostate gland, fat containing umbilical hernia, and atherosclerosis.   Electronically Signed   By: MJeb LeveringM.D.   On: 11/16/2014 18:00    Review of Systems  Constitutional: Negative for fever and chills.  HENT: Negative for  congestion, ear discharge and nosebleeds.   Respiratory: Negative for shortness of breath.        Has OSA but doesn't use CPAP  Cardiovascular: Negative for chest pain, palpitations, leg swelling and PND.       Dr KRon Parker cardiologist; had nuclear stress test in fall - no reversible ischemia  Gastrointestinal: Positive for nausea, abdominal pain and diarrhea. Negative for vomiting, constipation, blood in stool and melena.       Has gastroparesis  Genitourinary: Negative for dysuria and hematuria.       +nocturia;  no hesistancy. Some sensation of incomplete emptying  Musculoskeletal: Negative for myalgias.  Neurological: Negative for focal weakness, seizures, loss of consciousness and headaches.  Psychiatric/Behavioral: The patient is not nervous/anxious and does not have insomnia.        Uses THC for gastroparesis last use this am    Blood pressure 165/77, pulse 82, temperature 97.6 F (36.4 C), resp. rate 18, height 6' (1.829 m), weight 97.523 kg (215 lb), SpO2 97 %. Physical Exam  Vitals reviewed. Constitutional: He is oriented to person, place, and time. He appears well-developed and well-nourished. No distress.  Morbidly obese  HENT:  Head: Normocephalic and atraumatic.  Right Ear: External ear normal.  Left Ear: External ear normal.  Eyes: Conjunctivae are normal. No scleral icterus.  Neck: Normal range of motion. Neck supple. No tracheal deviation present. No thyromegaly present.  Cardiovascular: Normal rate and normal heart sounds.   Respiratory: Effort normal and breath sounds normal. No stridor. No respiratory distress. He has no wheezes.  GI: Soft. He exhibits no distension. There is tenderness. There is guarding. There is no rebound.  Protuberant abdomen, reducible umbilical hernia, about 2cm. RLQ/rt mid abd TTP; +voluntary guarding; no peritonitis; insulin pump on abd wall  Musculoskeletal: He exhibits no edema or tenderness.  Lymphadenopathy:    He has no cervical  adenopathy.  Neurological: He is alert and oriented to person, place, and time. He exhibits normal muscle tone.  Skin: Skin is warm and dry. No rash noted. He is not diaphoretic. No erythema. No pallor.  Psychiatric: He has a normal mood and affect. His behavior is normal. Judgment and thought content normal.     Assessment/Plan Acute appendicitis Umbilical hernia HTN IDDM Gastroparesis CAD OSA BPH  We discussed the etiology and management of acute appendicitis. We discussed operative and nonoperative management.  I recommended operative management along with IV antibiotics.  We discussed laparoscopic appendectomy. We discussed the risk and benefits of surgery including but not limited to bleeding, infection, injury to surrounding structures, need to convert to an open procedure, blood clot formation, post operative abscess or wound infection, staple line complications such as leak or bleeding, hernia formation, post operative ileus, need for additional procedures, anesthesia complications, and the typical postoperative course. I explained that the patient should expect a good improvement in their symptoms.  We also discussed umbilical hernia repair. In this setting can't use permanent mesh. Will just be able to do primary repair. Discussed that he is at increased risk for recurrence/wound infection  All questions asked and answered  Tyler Wilkinson. Redmond Pulling, MD, FACS General, Bariatric, & Minimally Invasive Surgery [facility], [place]   [facility] M 11/16/2014, 8:13 PM

## 2014-11-17 ENCOUNTER — Encounter (HOSPITAL_COMMUNITY): Payer: Self-pay | Admitting: General Surgery

## 2014-11-17 LAB — GLUCOSE, CAPILLARY
GLUCOSE-CAPILLARY: 185 mg/dL — AB (ref 70–99)
GLUCOSE-CAPILLARY: 254 mg/dL — AB (ref 70–99)
GLUCOSE-CAPILLARY: 269 mg/dL — AB (ref 70–99)
Glucose-Capillary: 130 mg/dL — ABNORMAL HIGH (ref 70–99)

## 2014-11-17 LAB — CBC
HCT: 38.4 % — ABNORMAL LOW (ref 39.0–52.0)
Hemoglobin: 13.2 g/dL (ref 13.0–17.0)
MCH: 28.6 pg (ref 26.0–34.0)
MCHC: 34.4 g/dL (ref 30.0–36.0)
MCV: 83.1 fL (ref 78.0–100.0)
PLATELETS: 152 10*3/uL (ref 150–400)
RBC: 4.62 MIL/uL (ref 4.22–5.81)
RDW: 12.8 % (ref 11.5–15.5)
WBC: 11.6 10*3/uL — AB (ref 4.0–10.5)

## 2014-11-17 LAB — BASIC METABOLIC PANEL
ANION GAP: 7 (ref 5–15)
BUN: 12 mg/dL (ref 6–23)
CALCIUM: 7.7 mg/dL — AB (ref 8.4–10.5)
CO2: 23 mmol/L (ref 19–32)
Chloride: 105 mmol/L (ref 96–112)
Creatinine, Ser: 1.37 mg/dL — ABNORMAL HIGH (ref 0.50–1.35)
GFR calc Af Amer: 64 mL/min — ABNORMAL LOW (ref >90)
GFR, EST NON AFRICAN AMERICAN: 55 mL/min — AB (ref >90)
GLUCOSE: 280 mg/dL — AB (ref 70–99)
POTASSIUM: 4 mmol/L (ref 3.5–5.1)
SODIUM: 135 mmol/L (ref 135–145)

## 2014-11-17 MED ORDER — HYDROCHLOROTHIAZIDE 25 MG PO TABS
25.0000 mg | ORAL_TABLET | Freq: Every day | ORAL | Status: DC
Start: 2014-11-17 — End: 2014-11-17
  Administered 2014-11-17: 25 mg via ORAL
  Filled 2014-11-17: qty 1

## 2014-11-17 MED ORDER — ALBUTEROL SULFATE (2.5 MG/3ML) 0.083% IN NEBU: 3.0000 mL | INHALATION_SOLUTION | Freq: Four times a day (QID) | RESPIRATORY_TRACT | Status: DC | PRN

## 2014-11-17 MED ORDER — DEXTROSE 5 % IV SOLN
2.0000 g | Freq: Three times a day (TID) | INTRAVENOUS | Status: DC
  Administered 2014-11-17: 2 g via INTRAVENOUS
  Filled 2014-11-17 (×4): qty 2

## 2014-11-17 MED ORDER — METOCLOPRAMIDE HCL 5 MG PO TABS
5.0000 mg | ORAL_TABLET | Freq: Three times a day (TID) | ORAL | Status: DC
  Administered 2014-11-17 (×2): 5 mg via ORAL
  Filled 2014-11-17 (×2): qty 1

## 2014-11-17 MED ORDER — ENOXAPARIN SODIUM 40 MG/0.4ML ~~LOC~~ SOLN: 40.0000 mg | SUBCUTANEOUS | Status: DC

## 2014-11-17 MED ORDER — LATANOPROST 0.005 % OP SOLN
1.0000 [drp] | Freq: Every day | OPHTHALMIC | Status: DC
Start: 2014-11-17 — End: 2014-11-17
  Filled 2014-11-17: qty 2.5

## 2014-11-17 MED ORDER — POTASSIUM CHLORIDE IN NACL 20-0.9 MEQ/L-% IV SOLN
INTRAVENOUS | Status: DC
  Administered 2014-11-17: 03:00:00 via INTRAVENOUS
  Filled 2014-11-17 (×2): qty 1000

## 2014-11-17 MED ORDER — BENAZEPRIL HCL 40 MG PO TABS
40.0000 mg | ORAL_TABLET | Freq: Every day | ORAL | Status: DC
  Administered 2014-11-17: 40 mg via ORAL
  Filled 2014-11-17: qty 1

## 2014-11-17 MED ORDER — INSULIN ASPART 100 UNIT/ML ~~LOC~~ SOLN
3.0000 [IU] | Freq: Three times a day (TID) | SUBCUTANEOUS | Status: DC
  Administered 2014-11-17: 3 [IU] via SUBCUTANEOUS

## 2014-11-17 MED ORDER — OXYCODONE-ACETAMINOPHEN 5-325 MG PO TABS
1.0000 | ORAL_TABLET | ORAL | Status: DC | PRN
  Administered 2014-11-17 (×2): 2 via ORAL
  Filled 2014-11-17 (×2): qty 2

## 2014-11-17 MED ORDER — IBUPROFEN 600 MG PO TABS: 600.0000 mg | ORAL_TABLET | Freq: Four times a day (QID) | ORAL | Status: DC | PRN

## 2014-11-17 MED ORDER — ACETAMINOPHEN 500 MG PO TABS
1000.0000 mg | ORAL_TABLET | Freq: Four times a day (QID) | ORAL | Status: DC
  Filled 2014-11-17: qty 2

## 2014-11-17 MED ORDER — MORPHINE SULFATE 2 MG/ML IJ SOLN: 1.0000 mg | INTRAMUSCULAR | Status: DC | PRN

## 2014-11-17 MED ORDER — ONDANSETRON HCL 4 MG PO TABS: 4.0000 mg | ORAL_TABLET | Freq: Four times a day (QID) | ORAL | Status: DC | PRN

## 2014-11-17 MED ORDER — ALPRAZOLAM 0.5 MG PO TABS: 0.5000 mg | ORAL_TABLET | Freq: Three times a day (TID) | ORAL | Status: DC | PRN

## 2014-11-17 MED ORDER — CARVEDILOL 6.25 MG PO TABS
6.2500 mg | ORAL_TABLET | Freq: Two times a day (BID) | ORAL | Status: DC
  Administered 2014-11-17: 6.25 mg via ORAL
  Filled 2014-11-17: qty 1

## 2014-11-17 MED ORDER — INSULIN ASPART 100 UNIT/ML ~~LOC~~ SOLN
0.0000 [IU] | Freq: Three times a day (TID) | SUBCUTANEOUS | Status: DC
Start: 2014-11-17 — End: 2014-11-17
  Administered 2014-11-17 (×2): 11 [IU] via SUBCUTANEOUS

## 2014-11-17 MED ORDER — ONDANSETRON HCL 4 MG/2ML IJ SOLN: 4.0000 mg | Freq: Four times a day (QID) | INTRAMUSCULAR | Status: DC | PRN

## 2014-11-17 MED ORDER — PROMETHAZINE HCL 25 MG/ML IJ SOLN: 12.5000 mg | Freq: Four times a day (QID) | INTRAMUSCULAR | Status: DC | PRN

## 2014-11-17 MED ORDER — OXYCODONE-ACETAMINOPHEN 5-325 MG PO TABS: 1.0000 | ORAL_TABLET | Freq: Four times a day (QID) | ORAL | Status: DC | PRN

## 2014-11-17 NOTE — Progress Notes (Signed)
Spoke briefly with patient regarding V-Go insulin delivery.  He currently does not have the V-Go on.  States he will put on when he gets home.  He has supplies/insulin at home.  States his blood sugars are better controlled with V-Go.  He will d/c today.   Thanks, Beryl MeagerJenny Zackrey Dyar, RN, BC-ADM Inpatient Diabetes Coordinator Pager [phone]

## 2014-11-17 NOTE — Discharge Summary (Signed)
[facility] Discharge Summary   Patient ID: Tyler Wilkinson MRN: [id number] DOB/AGE: 12/13/1955 59 y.o.  Admit date: 11/16/2014 Discharge date: 11/17/2014  Admitting Diagnosis: Acute appendicitis Umbilical hernia repair Multiple chronic medical conditions as below  Discharge Diagnosis Patient Active Problem List   Diagnosis Date Noted  . Acute appendicitis 11/17/2014  . Chronic back pain 10/07/2014  . Depression 10/07/2014  . Seborrheic keratosis 08/05/2014  . Cutaneous skin tags 08/05/2014  . Acute viral pharyngitis 06/29/2014  . Health care maintenance 06/24/2014  . Essential hypertension 05/10/2014  . Acute upper respiratory infections of unspecified site 07/07/2013  . Sprain of right ankle 07/10/2012  . Diabetic gastroparesis 05/05/2012  . Dental caries 05/05/2012  . Dyslipidemia   . CAD (coronary artery disease), native coronary artery   . Ejection fraction   . Bilateral carpal tunnel syndrome 06/09/2010  . ULNAR NEUROPATHY 06/09/2010  . CERUMEN IMPACTION 02/24/2009  . SLEEP APNEA 12/28/2008  . UNSPECIFIED DISORDER OF LIVER 09/16/2008  . ASTHMA, INTRINSIC, WITH ACUTE EXACERBATION 12/24/2006  . DYSPEPSIA 12/19/2006  . Allergic rhinitis 07/28/2006  . ERECTILE DYSFUNCTION, ORGANIC 07/28/2006  . 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 Recurrent vomiting. Decreased appetite. Symptoms for 1 day.  EXAM: CT ABDOMEN AND PELVIS WITH CONTRAST  TECHNIQUE: Multidetector CT imaging of the abdomen and pelvis was performed using the standard protocol following bolus administration of intravenous contrast.  CONTRAST:  182m OMNIPAQUE IOHEXOL 300 MG/ML  SOLN  COMPARISON:  None.  FINDINGS: Mild dependent atelectasis in the lung bases.  The appendix is dilated measuring 13 mm with  appendicular without the base. There is mild surrounding periappendiceal inflammatory change. Findings are consistent with uncomplicated acute appendicitis. No perforation or abscess.  The liver, gallbladder, and adrenal glands are normal. Spleen at the upper limits of normal in size measuring 13.4 cm, no focal abnormality. Mild pancreatic fatty atrophy without peripancreatic inflammatory change or ductal dilatation.  Kidneys demonstrate symmetric enhancement and excretion. There is no hydronephrosis. No focal renal abnormality.  The stomach is physiologically distended with contrast. There are no dilated or thickened small bowel loops. Small volume of colonic stool, no colonic wall thickening.  Abdominal aorta is normal in caliber with mild atherosclerosis. No retroperitoneal adenopathy. Small fat containing umbilical hernia.  Within the pelvis the urinary bladder is mildly distended. Prostate gland appears enlarged measuring 6.3 x 4.9 cm. All no pelvic free fluid.  There are no acute or suspicious osseous abnormalities. Scattered bone islands including the left acetabulum.  IMPRESSION: 1. Findings consistent with uncomplicated acute appendicitis. 2. Incidental findings of enlarged prostate gland, fat containing umbilical hernia, and atherosclerosis.   Electronically Signed   By: MJeb LeveringM.D.   On: 11/16/2014 18:00    Procedures Dr. WRedmond Pulling(11/17/14) - Laparoscopic Appendectomy with primary umbilical hernia repair  Hospital Course:  59yo WM with IDDM, HTN, CAD, OSA, gastroparesis awoke on 11/16/14 with diarrhea and some abdominal discomfort. On way to work, abd pain worsened. Felt nauseated but didn't vomit. Left after short time at work and came to ED for worsening periumbilical/rt sided pain. No prior symptoms. Not typical gastroparesis symptoms. No fever/chills. CT showed early acute appendicitis.   Patient was admitted and underwent procedure listed above.  His insulin pump was discontinued for  the procedure.  This will be resumed when  he is discharged.  Tolerated procedure well and was transferred to the floor.  Diet was advanced as tolerated.  His creatinine is mildly elevated today, but he is urinating well and is expected to normalize over the next two days.  He is advised to follow up with his PCP within 1 week to have a post-hospital follow up.  On POD #1, the patient was voiding well, tolerating diet, ambulating well, pain well controlled, vital signs stable, incisions c/d/i and felt stable for discharge home.  Patient will follow up in our office in 2-3 weeks and knows to call with questions or concerns.   Physical Exam: General:  Alert, NAD, pleasant, comfortable Abd:  Soft, ND, mild tenderness, incisions C/D/I with dermabond in place     Medication List    TAKE these medications        acetaminophen-codeine 300-30 MG per tablet  Commonly known as:  TYLENOL #3  Take 1 tablet by mouth every 4 (four) hours as needed for moderate pain.     albuterol 108 (90 BASE) MCG/ACT inhaler  Commonly known as:  PROVENTIL HFA;VENTOLIN HFA  Inhale 2 puffs into the lungs every 6 (six) hours as needed for wheezing or shortness of breath.     ALPRAZolam 0.5 MG tablet  Commonly known as:  XANAX  Take 1 tablet (0.5 mg total) by mouth 3 (three) times daily as needed for sleep or anxiety.     Arginine 500 MG Caps  Take 500 mg by mouth daily.     aspirin EC 81 MG tablet  Take 1 tablet (81 mg total) by mouth daily.     benazepril 40 MG tablet  Commonly known as:  LOTENSIN  Take 1 tablet (40 mg total) by mouth daily.     carvedilol 6.25 MG tablet  Commonly known as:  COREG  Take 1 tablet (6.25 mg total) by mouth 2 (two) times daily.     FLAX SEED OIL PO  Take by mouth daily.     glucose blood test strip  Commonly known as:  ONE TOUCH ULTRA TEST  Use to check blood sugars 3-4 times daily. Code: E11.42.     hydrochlorothiazide 25 MG tablet  Commonly known as:  HYDRODIURIL  Take  1 tablet (25 mg total) by mouth daily.     insulin aspart 100 UNIT/ML injection  Commonly known as:  NOVOLOG  For use for with VGo 40. 40 units of basal and 2 units (1 click) with small meals, 3-5 units with larger meals     insulin lispro 100 UNIT/ML injection  Commonly known as:  HUMALOG  For use for with VGo 40. 40 units of basal and 2 units (1 click) with small meals, 3-5 units with larger meals     INSULIN SYRINGE .5CC/31GX5/16" 31G X 5/16" 0.5 ML Misc  - Use to inject insulin 3 times a day  - Dx code 250.00     latanoprost 0.005 % ophthalmic solution  Commonly known as:  XALATAN  Place 1 drop into both eyes at bedtime.     levocetirizine 5 MG tablet  Commonly known as:  XYZAL  Take 1 tablet (5 mg total) by mouth every evening.     metoCLOPramide 5 MG tablet  Commonly known as:  REGLAN  Take 1 tablet (5 mg total) by mouth 4 (four) times daily - after meals and at bedtime.     naproxen 500 MG tablet  Commonly known as:  NAPROSYN  Take 1  tablet (500 mg total) by mouth 2 (two) times daily with a meal.     nitroGLYCERIN 0.4 MG SL tablet  Commonly known as:  NITROSTAT  Place 1 tablet (0.4 mg total) under the tongue every 5 (five) minutes as needed for chest pain (DO NOT USE IF YOU HAVE TAKEN VIAGRA).     ONE TOUCH ULTRA 2 W/DEVICE Kit  Use to check blood sugars up to 6 times daily     ONETOUCH DELICA LANCETS 63Z Misc  Use to check blood sugars up to 6 times a day. Dx code: 250.60     oxyCODONE-acetaminophen 5-325 MG per tablet  Commonly known as:  PERCOCET/ROXICET  Take 1-2 tablets by mouth every 6 (six) hours as needed for moderate pain.     rosuvastatin 10 MG tablet  Commonly known as:  CRESTOR  Take 1 tablet (10 mg total) by mouth daily.     sildenafil 100 MG tablet  Commonly known as:  VIAGRA  Take 0.5 tablets (50 mg total) by mouth as needed for erectile dysfunction (DO NOT USE IF YOU ARE TAKING NTG).     traMADol 50 MG tablet  Commonly known as:  ULTRAM   Take 1 tablet (50 mg total) by mouth every 6 (six) hours as needed.     V-GO 40 Kit  2 units (1 click) with smaller meals and 3-5 units with larger meals.     VITAMIN D (CHOLECALCIFEROL) PO  Take 1 capsule by mouth daily.         Follow-up Information    Follow up with [facility]    . Call in 3 days.   Contact information:   [address] [place] [address] [phone]      Follow up with Rosario Jacks., Anne Hahn, MD. Schedule an appointment as soon as possible for a visit in 2 weeks.   Specialty:  General Surgery   Why:  For post-operation check.  The office should call you with an appointment date/time, but please call by Monday if you do not hear from the office.   Contact information:   [place]  [address] [phone]       Signed: Coralie Keens [facility] [phone]  11/17/2014, 9:03 AM

## 2014-11-17 NOTE — Discharge Instructions (Signed)
Follow up with your primary care regarding your diabetes management   LAPAROSCOPIC SURGERY: POST OP INSTRUCTIONS  1. DIET: Follow a light bland diet the first 24 hours after arrival home, such as soup, liquids, crackers, etc. Be sure to include lots of fluids daily. Avoid fast food or heavy meals as your are more likely to get nauseated. Eat a low fat the next few days after surgery.  2. Take your usually prescribed home medications unless otherwise directed. 3. PAIN CONTROL:  1. Pain is best controlled by a usual combination of three different methods TOGETHER:  1. Ice/Heat 2. Over the counter pain medication 3. Prescription pain medication 2. Most patients will experience some swelling and bruising around the incisions. Ice packs or heating pads (30-60 minutes up to 6 times a day) will help. Use ice for the first few days to help decrease swelling and bruising, then switch to heat to help relax tight/sore spots and speed recovery. Some people prefer to use ice alone, heat alone, alternating between ice & heat. Experiment to what works for you. Swelling and bruising can take several weeks to resolve.  3. It is helpful to take an over-the-counter pain medication regularly for the first few weeks. Choose one of the following that works best for you:  1. Naproxen (Aleve, etc) Two 220mg  tabs twice a day 2. Ibuprofen (Advil, etc) Three 200mg  tabs four times a day (every meal & bedtime) 3. Acetaminophen (Tylenol, etc) 500-650mg  four times a day (every meal & bedtime) 4. A prescription for pain medication (such as oxycodone, hydrocodone, etc) should be given to you upon discharge. Take your pain medication as prescribed.  1. If you are having problems/concerns with the prescription medicine (does not control pain, nausea, vomiting, rash, itching, etc), please call us [phone] to see if we need to switch you to a different pain medicine that will work better for you and/or control your side effect  better. 2. If you need a refill on your pain medication, please contact your pharmacy. They will contact our office to request authorization. Prescriptions will not be filled after 5 pm or on week-ends. 4. Avoid getting constipated. Between the surgery and the pain medications, it is common to experience some constipation. Increasing fluid intake and taking a fiber supplement (such as Metamucil, Citrucel, FiberCon, MiraLax, etc) 1-2 times a day regularly will usually help prevent this problem from occurring. A mild laxative (prune juice, Milk of Magnesia, MiraLax, etc) should be taken according to package directions if there are no bowel movements after 48 hours.  5. Watch out for diarrhea. If you have many loose bowel movements, simplify your diet to bland foods & liquids for a few days. Stop any stool softeners and decrease your fiber supplement. Switching to mild anti-diarrheal medications (Kayopectate, Pepto Bismol) can help. If this worsens or does not improve, please call us. 6. Wash / shower every day. You may shower over the dressings as they are waterproof. Continue to shower over incision(s) after the dressing is off. 7. Remove your waterproof bandages 5 days after surgery. You may leave the incision open to air. You may replace a dressing/Band-Aid to cover the incision for comfort if you wish.  8. ACTIVITIES as tolerated:  1. You may resume regular (light) daily activities beginning the next day--such as daily self-care, walking, climbing stairs--gradually increasing activities as tolerated. If you can walk 30 minutes without difficulty, it is safe to try more intense activity such as jogging, treadmill, bicycling, low-impact  aerobics, swimming, etc. 2. Save the most intensive and strenuous activity for last such as sit-ups, heavy lifting, contact sports, etc Refrain from any heavy lifting or straining until you are off narcotics for pain control.  3. DO NOT PUSH THROUGH PAIN. Let pain be your  guide: If it hurts to do something, don't do it. Pain is your body warning you to avoid that activity for another week until the pain goes down. 4. You may drive when you are no longer taking prescription pain medication, you can comfortably wear a seatbelt, and you can safely maneuver your car and apply brakes. 5. You may have sexual intercourse when it is comfortable.  9. FOLLOW UP in our office  1. Please call [facility] at [phone] to set up an appointment to see your surgeon in the office for a follow-up appointment approximately 2-3 weeks after your surgery. 2. Make sure that you call for this appointment the day you arrive home to insure a convenient appointment time.      10. IF YOU HAVE DISABILITY OR FAMILY LEAVE FORMS, BRING THEM TO THE               OFFICE FOR PROCESSING.   WHEN TO CALL us [phone]:  1. Poor pain control 2. Reactions / problems with new medications (rash/itching, nausea, etc)  3. Fever over 101.5 F (38.5 C) 4. Inability to urinate 5. Nausea and/or vomiting 6. Worsening swelling or bruising 7. Continued bleeding from incision. 8. Increased pain, redness, or drainage from the incision  The clinic staff is available to answer your questions during regular business hours (8:30am-5pm). Please dont hesitate to call and ask to speak to one of our nurses for clinical concerns.  If you have a medical emergency, go to the nearest emergency room or call 911.  A surgeon from [facility] is always on call at the [facility], [place]  [address], [place], [place] [address] ?  MAIN: [phone] ? TOLL FREE: [phone] ?  FAX [phone]  [email or web address]

## 2014-11-17 NOTE — Progress Notes (Signed)
UR completed 

## 2014-11-18 NOTE — Anesthesia Postprocedure Evaluation (Signed)
  Anesthesia Post-op Note  Patient: Tyler Wilkinson  Procedure(s) Performed: Procedure(s) (LRB): APPENDECTOMY LAPAROSCOPIC (N/A) OPEN PRIMARY UMBILICAL HERNIA REPAIR  (N/A)  Patient Location: PACU  Anesthesia Type: General  Level of Consciousness: awake and alert   Airway and Oxygen Therapy: Patient Spontanous Breathing  Post-op Pain: mild  Post-op Assessment: Post-op Vital signs reviewed, Patient's Cardiovascular Status Stable, Respiratory Function Stable, Patent Airway and No signs of Nausea or vomiting  Last Vitals:  Filed Vitals:   11/17/14 1255  BP: 132/72  Pulse: 88  Temp: 36.9 C  Resp: 16    Post-op Vital Signs: stable   Complications: No apparent anesthesia complications

## 2014-11-19 LAB — GLUCOSE, CAPILLARY: GLUCOSE-CAPILLARY: 259 mg/dL — AB (ref 70–99)

## 2014-12-01 ENCOUNTER — Telehealth: Payer: Self-pay | Admitting: Internal Medicine

## 2014-12-01 NOTE — Telephone Encounter (Signed)
Call to patient to confirm appointment for 12/02/14 at 10:45 lmtcb

## 2014-12-02 ENCOUNTER — Ambulatory Visit: Payer: 59 | Admitting: Dietician

## 2014-12-02 ENCOUNTER — Ambulatory Visit (INDEPENDENT_AMBULATORY_CARE_PROVIDER_SITE_OTHER): Payer: 59 | Admitting: Internal Medicine

## 2014-12-02 ENCOUNTER — Other Ambulatory Visit: Payer: Self-pay | Admitting: Oncology

## 2014-12-02 ENCOUNTER — Encounter: Payer: Self-pay | Admitting: Dietician

## 2014-12-02 VITALS — BP 144/77 | HR 78 | Temp 97.8°F | Ht 73.0 in | Wt 215.0 lb

## 2014-12-02 DIAGNOSIS — F329 Major depressive disorder, single episode, unspecified: Secondary | ICD-10-CM | POA: Diagnosis not present

## 2014-12-02 DIAGNOSIS — Z9889 Other specified postprocedural states: Secondary | ICD-10-CM

## 2014-12-02 DIAGNOSIS — E1143 Type 2 diabetes mellitus with diabetic autonomic (poly)neuropathy: Secondary | ICD-10-CM

## 2014-12-02 DIAGNOSIS — E119 Type 2 diabetes mellitus without complications: Secondary | ICD-10-CM

## 2014-12-02 DIAGNOSIS — Z760 Encounter for issue of repeat prescription: Secondary | ICD-10-CM

## 2014-12-02 DIAGNOSIS — I10 Essential (primary) hypertension: Secondary | ICD-10-CM

## 2014-12-02 DIAGNOSIS — Z8719 Personal history of other diseases of the digestive system: Secondary | ICD-10-CM | POA: Insufficient documentation

## 2014-12-02 DIAGNOSIS — F32A Depression, unspecified: Secondary | ICD-10-CM

## 2014-12-02 DIAGNOSIS — N529 Male erectile dysfunction, unspecified: Secondary | ICD-10-CM

## 2014-12-02 DIAGNOSIS — N528 Other male erectile dysfunction: Secondary | ICD-10-CM

## 2014-12-02 LAB — POCT GLYCOSYLATED HEMOGLOBIN (HGB A1C): Hemoglobin A1C: 8.1

## 2014-12-02 LAB — GLUCOSE, CAPILLARY: Glucose-Capillary: 194 mg/dL — ABNORMAL HIGH (ref 70–99)

## 2014-12-02 LAB — HM DIABETES EYE EXAM

## 2014-12-02 MED ORDER — SILDENAFIL CITRATE 20 MG PO TABS: 20.0000 mg | ORAL_TABLET | ORAL | Status: DC | PRN

## 2014-12-02 MED ORDER — ALPRAZOLAM 0.5 MG PO TABS: 0.5000 mg | ORAL_TABLET | Freq: Three times a day (TID) | ORAL | Status: DC | PRN

## 2014-12-02 MED ORDER — ROSUVASTATIN CALCIUM 10 MG PO TABS: 10.0000 mg | ORAL_TABLET | Freq: Every day | ORAL | Status: DC

## 2014-12-02 MED ORDER — DULOXETINE HCL 60 MG PO CPEP: 60.0000 mg | ORAL_CAPSULE | Freq: Every day | ORAL | Status: DC

## 2014-12-02 NOTE — Progress Notes (Signed)
Medicine attending: Medical history, presenting problems, physical findings, and medications, reviewed with Dr Erik Hoffman and I concur with his evaluation and management plan. 

## 2014-12-02 NOTE — Progress Notes (Signed)
Retinal images done and transmitted.  

## 2014-12-02 NOTE — Patient Instructions (Signed)
General Instructions:  Follow up in 2-3 months  Please bring your medicines with you each time you come to clinic.  Medicines may include prescription medications, over-the-counter medications, herbal remedies, eye drops, vitamins, or other pills.   Progress Toward Treatment Goals:  Treatment Goal 06/24/2014  Hemoglobin A1C improved  Blood pressure at goal  Prevent falls -    Self Care Goals & Plans:  Self Care Goal 10/07/2014  Manage my medications take my medicines as prescribed; bring my medications to every visit; refill my medications on time  Monitor my health -  Eat healthy foods drink diet soda or water instead of juice or soda; eat more vegetables; eat foods that are low in salt; eat baked foods instead of fried foods; eat fruit for snacks and desserts  Be physically active -    Home Blood Glucose Monitoring 06/24/2014  Check my blood sugar 4 times a day  When to check my blood sugar before meals; at bedtime     Care Management & Community Referrals:  Referral 10/20/2013  Referrals made for care management support none needed

## 2014-12-02 NOTE — Progress Notes (Signed)
[facility] Subjective:   Patient ID: Tyler Wilkinson male   DOB: [birth year] 59 y.o.   MRN: [id number]  HPI: Tyler Wilkinson is a 59 y.o. male with a PMH detailed below who presents for HFU.  He was recently admitted for abdominal pain and found to have acute appendicitis which was surgically treated. During this treatment he also had repair of an umbical hernia.  Otherwise he reports doing well and no acute issues.  Please seen A&P below for further details of chronic problems.  Past Medical History  Diagnosis Date  . Hypertension   . Erectile dysfunction   . Dyslipidemia   . CAD (coronary artery disease)     stent RCA 2006 (other residual disease). /  nuclear 2008  no ischemia, inferolateral scar.  . Ejection fraction     EF 55%,echo, 2008, inferolateral hypokinesis,  . Myocardial infarction 2006  . Chronic bronchitis     "anytime I get sick it goes into bronchitis; probably get it q yr"  . Sleep apnea     "not wearing mask" (05/10/2014)  . Type I diabetes mellitus dx'd 1972  . Daily headache     "just recently" (05/10/2014)  . Gastroparesis   . Asthma   . Cataract    Current Outpatient Prescriptions  Medication Sig Dispense Refill  . acetaminophen-codeine (TYLENOL #3) 300-30 MG per tablet Take 1 tablet by mouth every 4 (four) hours as needed for moderate pain. 30 tablet 0  . albuterol (PROVENTIL HFA;VENTOLIN HFA) 108 (90 BASE) MCG/ACT inhaler Inhale 2 puffs into the lungs every 6 (six) hours as needed for wheezing or shortness of breath. 1 Inhaler 2  . ALPRAZolam (XANAX) 0.5 MG tablet Take 1 tablet (0.5 mg total) by mouth 3 (three) times daily as needed for sleep or anxiety. 30 tablet 2  . Arginine 500 MG CAPS Take 500 mg by mouth daily.    Marland Kitchen aspirin EC 81 MG tablet Take 1 tablet (81 mg total) by mouth daily.    . benazepril (LOTENSIN) 40 MG tablet Take 1 tablet (40 mg total) by mouth daily. 90 tablet 3  . Blood Glucose Monitoring Suppl (ONE TOUCH  ULTRA 2) W/DEVICE KIT Use to check blood sugars up to 6 times daily 1 each 0  . carvedilol (COREG) 6.25 MG tablet Take 1 tablet (6.25 mg total) by mouth 2 (two) times daily. 60 tablet 3  . DULoxetine (CYMBALTA) 60 MG capsule Take 1 capsule (60 mg total) by mouth daily. 30 capsule 5  . Flaxseed, Linseed, (FLAX SEED OIL PO) Take by mouth daily.      Marland Kitchen glucose blood (ONE TOUCH ULTRA TEST) test strip Use to check blood sugars 3-4 times daily. Code: E11.42. 200 each 11  . hydrochlorothiazide (HYDRODIURIL) 25 MG tablet Take 1 tablet (25 mg total) by mouth daily. 60 tablet 5  . Insulin Disposable Pump (V-GO 40) KIT 2 units (1 click) with smaller meals and 3-5 units with larger meals. 1 kit 11  . insulin lispro (HUMALOG) 100 UNIT/ML injection For use for with VGo 40. 40 units of basal and 2 units (1 click) with small meals, 3-5 units with larger meals 10 mL 3  . Insulin Syringe-Needle U-100 (INSULIN SYRINGE .5CC/31GX5/16") 31G X 5/16" 0.5 ML MISC Use to inject insulin 3 times a day Dx code 250.00 100 each 12  . latanoprost (XALATAN) 0.005 % ophthalmic solution Place 1 drop into both eyes at bedtime.    Marland Kitchen levocetirizine (XYZAL)  5 MG tablet Take 1 tablet (5 mg total) by mouth every evening. 30 tablet 11  . metoCLOPramide (REGLAN) 5 MG tablet Take 1 tablet (5 mg total) by mouth 4 (four) times daily - after meals and at bedtime. 120 tablet 1  . naproxen (NAPROSYN) 500 MG tablet Take 1 tablet (500 mg total) by mouth 2 (two) times daily with a meal. 60 tablet 3  . nitroGLYCERIN (NITROSTAT) 0.4 MG SL tablet Place 1 tablet (0.4 mg total) under the tongue every 5 (five) minutes as needed for chest pain (DO NOT USE IF YOU HAVE TAKEN VIAGRA). 25 tablet 2  . ONETOUCH DELICA LANCETS 37J MISC Use to check blood sugars up to 6 times a day. Dx code: 250.60 200 each 6  . oxyCODONE-acetaminophen (PERCOCET/ROXICET) 5-325 MG per tablet Take 1-2 tablets by mouth every 6 (six) hours as needed for moderate pain. 40 tablet 0  .  rosuvastatin (CRESTOR) 10 MG tablet Take 1 tablet (10 mg total) by mouth daily. 30 tablet 11  . sildenafil (REVATIO) 20 MG tablet Take 1 tablet (20 mg total) by mouth as needed. Take 2-4 pills as needed for sexual activity 50 tablet 5  . sildenafil (VIAGRA) 100 MG tablet Take 0.5 tablets (50 mg total) by mouth as needed for erectile dysfunction (DO NOT USE IF YOU ARE TAKING NTG). (Patient taking differently: Take 50 mg by mouth at bedtime as needed for erectile dysfunction (DO NOT USE IF YOU ARE TAKING NTG). ) 20 tablet 1  . VITAMIN D, CHOLECALCIFEROL, PO Take 1 capsule by mouth daily.     No current facility-administered medications for this visit.   Family History  Problem Relation Age of Onset  . Hypertension Mother    History   Social History  . Marital Status: Single    Spouse Name: N/A  . Number of Children: N/A  . Years of Education: 9   Occupational History  . 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 Smoking status: Former Smoker -- .2 years    Types: Cigarettes    Quit date: 01/26/1973  . Smokeless tobacco: Never Used  . Alcohol Use: 0.0 oz/week    0 Standard drinks or equivalent per week     Comment: Beer rarely.  . Drug Use: Yes    Special: Marijuana     Comment: 05/10/2014 "related to stomach problems; last time was ~ 1 month ago"  . Sexual Activity: Not Currently   Other Topics Concern  . Not on file   Social History Narrative   Review of Systems: Review of Systems  Constitutional: Negative for fever, chills, weight loss and malaise/fatigue.  Eyes: Negative for blurred vision.  Respiratory: Negative for cough and shortness of breath.   Cardiovascular: Negative for chest pain and leg swelling.  Gastrointestinal: Negative for heartburn and abdominal pain.  Genitourinary: Negative for dysuria.  Musculoskeletal: Negative for myalgias.  Neurological: Negative for dizziness and headaches.  Endo/Heme/Allergies: Negative for  polydipsia.  Psychiatric/Behavioral: Positive for depression. Negative for suicidal ideas and substance abuse. The patient is nervous/anxious.      Objective:  Physical Exam: Filed Vitals:   12/02/14 1131  BP: 144/77  Pulse: 78  Temp: 97.8 F (36.6 C)  TempSrc: Oral  Height: 6' 1"  (1.854 m)  Weight: 215 lb (97.523 kg)  SpO2: 99%   Physical Exam  Constitutional: He is well-developed, well-nourished, and in no distress.  HENT:  Head: Normocephalic and atraumatic.  Cardiovascular:  Normal rate and regular rhythm.   Pulmonary/Chest: Effort normal and breath sounds normal.  Abdominal: Soft. Bowel sounds are normal. There is tenderness (mild around surgical sites.).  Surgical sites healing well  Psychiatric: Affect normal.  Nursing note and vitals reviewed.    Assessment & Plan:  Case discussed with Dr. 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 Lab Results  Component Value Date   HGBA1C 8.1 12/02/2014   HGBA1C 8.0 08/05/2014   HGBA1C 8.4* 05/11/2014     Assessment: Diabetes control:  fair control Progress toward A1C goal:   unchanged Comments:   Plan: Medications:  continue current medications VGO-40 Home glucose monitoring: Frequency:   Timing:   Instruction/counseling given: reminded to bring blood glucose meter & log to each visit, reminded to bring medications to each visit and discussed diet Educational resources provided:   Self management tools provided:    Other plans: Patient reports he is still getting use to how to use the VGO but feels he is doing a better job recently.      ERECTILE DYSFUNCTION, ORGANIC -Wants refill of Sildenafil but reports difficulty affording. - Will prescribe Sildenafil 15m tablets, may use 2-4 for ED.  He is prescribed NTG for possible chest pain. I discussed this with him and he reports he knows that he cannot take the 2 medications within 72 hours of each other and that if he is having chest pain he will inform EMS that he has taken viagra.    H/O umbilical hernia repair -Overall doing well, c/o some mild tenderness at surgical sites.   Depression - Given his depression, anxiety, and diabetic neuropathy I have discussed starting him on a medication that can treat all 3 and he is interested.  Will start Cymbalta 667mdaily.  He will try for at least 2-3 months.  I have given him a refill of his Xanax.     Medications Ordered Meds ordered this encounter  Medications  . ALPRAZolam (XANAX) 0.5 MG tablet    Sig: Take 1 tablet (0.5 mg total) by mouth 3 (three) times daily as needed for sleep or anxiety.    Dispense:  30 tablet    Refill:  2  . DULoxetine (CYMBALTA) 60 MG capsule    Sig: Take 1 capsule (60 mg total) by mouth daily.    Dispense:  30 capsule    Refill:  5  . sildenafil (REVATIO) 20 MG tablet    Sig: Take 1 tablet (20 mg total) by mouth as needed. Take 2-4 pills as needed for sexual activity    Dispense:  50 tablet    Refill:  5  . rosuvastatin (CRESTOR) 10 MG tablet    Sig: Take 1 tablet (10 mg total) by mouth daily.    Dispense:  30 tablet    Refill:  11   Other Orders Orders Placed This Encounter  Procedures  . Microalbumin / Creatinine Urine Ratio  . POC Hbg A1C  . Retinal/fundus photography  Standing Status: Future     Number of Occurrences: 1     Standing Expiration Date: 12/02/2015

## 2014-12-03 LAB — MICROALBUMIN / CREATININE URINE RATIO
CREATININE, URINE: 74.3 mg/dL
MICROALB UR: 0.3 mg/dL (ref <2.0)
Microalb Creat Ratio: 4 mg/g (ref 0.0–30.0)

## 2014-12-04 NOTE — Assessment & Plan Note (Signed)
Lab Results  Component Value Date   HGBA1C 8.1 12/02/2014   HGBA1C 8.0 08/05/2014   HGBA1C 8.4* 05/11/2014     Assessment: Diabetes control:  fair control Progress toward A1C goal:   unchanged Comments:   Plan: Medications:  continue current medications VGO-40 Home glucose monitoring: Frequency:   Timing:   Instruction/counseling given: reminded to bring blood glucose meter & log to each visit, reminded to bring medications to each visit and discussed diet Educational resources provided:   Self management tools provided:   Other plans: Patient reports he is still getting use to how to use the VGO but feels he is doing a better job recently.

## 2014-12-04 NOTE — Assessment & Plan Note (Signed)
-  Overall doing well, c/o some mild tenderness at surgical sites.

## 2014-12-04 NOTE — Assessment & Plan Note (Signed)
-  Wants refill of Sildenafil but reports difficulty affording. - Will prescribe Sildenafil 20mg  tablets, may use 2-4 for ED.  He is prescribed NTG for possible chest pain. I discussed this with him and he reports he knows that he cannot take the 2 medications within 72 hours of each other and that if he is having chest pain he will inform EMS that he has taken viagra.

## 2014-12-04 NOTE — Assessment & Plan Note (Signed)
-   Given his depression, anxiety, and diabetic neuropathy I have discussed starting him on a medication that can treat all 3 and he is interested.  Will start Cymbalta 60mg  daily.  He will try for at least 2-3 months.  I have given him a refill of his Xanax.

## 2014-12-04 NOTE — Assessment & Plan Note (Signed)
BP Readings from Last 3 Encounters:  12/02/14 144/77  11/17/14 132/72  10/07/14 136/65    Lab Results  Component Value Date   NA 135 11/17/2014   K 4.0 11/17/2014   CREATININE 1.37* 11/17/2014    Assessment: Blood pressure control:  mildly elevated Progress toward BP goal:    unchanged Comments:   Plan: Medications:  continue current medications Educational resources provided:   Self management tools provided:   Other plans:

## 2014-12-09 ENCOUNTER — Encounter: Payer: Self-pay | Admitting: *Deleted

## 2014-12-16 ENCOUNTER — Encounter: Payer: Self-pay | Admitting: *Deleted

## 2014-12-23 ENCOUNTER — Ambulatory Visit (INDEPENDENT_AMBULATORY_CARE_PROVIDER_SITE_OTHER): Payer: 59 | Admitting: Cardiology

## 2014-12-23 ENCOUNTER — Encounter: Payer: Self-pay | Admitting: Cardiology

## 2014-12-23 VITALS — BP 122/72 | HR 71 | Ht 73.0 in | Wt 214.4 lb

## 2014-12-23 DIAGNOSIS — E785 Hyperlipidemia, unspecified: Secondary | ICD-10-CM

## 2014-12-23 DIAGNOSIS — I10 Essential (primary) hypertension: Secondary | ICD-10-CM | POA: Diagnosis not present

## 2014-12-23 DIAGNOSIS — I2510 Atherosclerotic heart disease of native coronary artery without angina pectoris: Secondary | ICD-10-CM

## 2014-12-23 NOTE — Assessment & Plan Note (Signed)
Coronary disease is stable. No change in therapy. 

## 2014-12-23 NOTE — Assessment & Plan Note (Signed)
Blood pressures controlled. No change in therapy. 

## 2014-12-23 NOTE — Progress Notes (Signed)
Cardiology Office Note   Date:  12/23/2014   ID:  Bron, Snellings [birth year], MRN [id number]  PCP:  Lucious Groves, DO  Cardiologist:  Dola Argyle, MD   Chief Complaint  Patient presents with  . Appointment    Follow-up coronary artery disease      History of Present Illness: Tyler Wilkinson is a 58 y.o. male who presents today to follow up coronary disease. He had surgery for his appendix. His cardiac status is stable. He is not having any significant chest pain.    Past Medical History  Diagnosis Date  . Hypertension   . Erectile dysfunction   . Dyslipidemia   . CAD (coronary artery disease)     stent RCA 2006 (other residual disease). /  nuclear 2008  no ischemia, inferolateral scar.  . Ejection fraction     EF 55%,echo, 2008, inferolateral hypokinesis,  . Myocardial infarction 2006  . Chronic bronchitis     "anytime I get sick it goes into bronchitis; probably get it q yr"  . Sleep apnea     "not wearing mask" (05/10/2014)  . Type I diabetes mellitus dx'd 1972  . Daily headache     "just recently" (05/10/2014)  . Gastroparesis   . Asthma   . Cataract     Past Surgical History  Procedure Laterality Date  . Coronary angioplasty with stent placement  ~ 2006    "1"  . Laparoscopic appendectomy N/A 11/16/2014    Procedure: APPENDECTOMY LAPAROSCOPIC;  Surgeon: Greer Pickerel, MD;  Location: [facility];  Service: General;  Laterality: N/A;  . Umbilical hernia repair N/A 11/16/2014    Procedure: OPEN PRIMARY UMBILICAL HERNIA REPAIR ;  Surgeon: Greer Pickerel, MD;  Location: [facility];  Service: General;  Laterality: N/A;    Patient Active Problem List   Diagnosis Date Noted  . Dyslipidemia     Priority: High  . CAD (coronary artery disease), native coronary artery     Priority: High  . Ejection fraction     Priority: High  . H/O umbilical hernia repair 40/98/1191  . Acute appendicitis 11/17/2014  . Chronic back pain 10/07/2014  . Depression 10/07/2014  . Seborrheic  keratosis 08/05/2014  . Cutaneous skin tags 08/05/2014  . Acute viral pharyngitis 06/29/2014  . Health care maintenance 06/24/2014  . Essential hypertension 05/10/2014  . Acute upper respiratory infections of unspecified site 07/07/2013  . Sprain of right ankle 07/10/2012  . Diabetic gastroparesis 05/05/2012  . Dental caries 05/05/2012  . Bilateral carpal tunnel syndrome 06/09/2010  . ULNAR NEUROPATHY 06/09/2010  . CERUMEN IMPACTION 02/24/2009  . SLEEP APNEA 12/28/2008  . UNSPECIFIED DISORDER OF LIVER 09/16/2008  . ASTHMA, INTRINSIC, WITH ACUTE EXACERBATION 12/24/2006  . DYSPEPSIA 12/19/2006  . Allergic rhinitis 07/28/2006  . ERECTILE DYSFUNCTION, ORGANIC 07/28/2006  . 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 06/03/2006      Current Outpatient Prescriptions  Medication Sig Dispense Refill  . acetaminophen-codeine (TYLENOL #3) 300-30 MG per tablet Take 1 tablet by mouth every 4 (four) hours as needed for moderate pain. 30 tablet 0  . albuterol (PROVENTIL HFA;VENTOLIN HFA) 108 (90 BASE) MCG/ACT inhaler Inhale 2 puffs into the lungs every 6 (six) hours as needed for wheezing or shortness of breath. 1 Inhaler 2  . ALPRAZolam (XANAX) 0.5 MG tablet Take 1 tablet (0.5 mg total) by mouth 3 (three) times daily as needed for sleep or anxiety. 30 tablet 2  . Arginine 500 MG CAPS Take  500 mg by mouth daily.    Marland Kitchen aspirin EC 81 MG tablet Take 1 tablet (81 mg total) by mouth daily.    . benazepril (LOTENSIN) 40 MG tablet Take 1 tablet (40 mg total) by mouth daily. 90 tablet 3  . Blood Glucose Monitoring Suppl (ONE TOUCH ULTRA 2) W/DEVICE KIT Use to check blood sugars up to 6 times daily 1 each 0  . carvedilol (COREG) 6.25 MG tablet Take 1 tablet (6.25 mg total) by mouth 2 (two) times daily. 60 tablet 3  . DULoxetine (CYMBALTA) 60 MG capsule Take 1 capsule (60 mg total) by mouth daily. 30 capsule 5  . Flaxseed, Linseed, (FLAX SEED OIL PO) Take by mouth daily.      Marland Kitchen glucose  blood (ONE TOUCH ULTRA TEST) test strip Use to check blood sugars 3-4 times daily. Code: E11.42. 200 each 11  . hydrochlorothiazide (HYDRODIURIL) 25 MG tablet Take 1 tablet (25 mg total) by mouth daily. 60 tablet 5  . Insulin Disposable Pump (V-GO 40) KIT 2 units (1 click) with smaller meals and 3-5 units with larger meals. 1 kit 11  . insulin lispro (HUMALOG) 100 UNIT/ML injection For use for with VGo 40. 40 units of basal and 2 units (1 click) with small meals, 3-5 units with larger meals 10 mL 3  . Insulin Syringe-Needle U-100 (INSULIN SYRINGE .5CC/31GX5/16") 31G X 5/16" 0.5 ML MISC Use to inject insulin 3 times a day Dx code 250.00 100 each 12  . latanoprost (XALATAN) 0.005 % ophthalmic solution Place 1 drop into both eyes at bedtime.    Marland Kitchen levocetirizine (XYZAL) 5 MG tablet Take 1 tablet (5 mg total) by mouth every evening. 30 tablet 11  . naproxen (NAPROSYN) 500 MG tablet Take 1 tablet (500 mg total) by mouth 2 (two) times daily with a meal. 60 tablet 3  . nitroGLYCERIN (NITROSTAT) 0.4 MG SL tablet Place 1 tablet (0.4 mg total) under the tongue every 5 (five) minutes as needed for chest pain (DO NOT USE IF YOU HAVE TAKEN VIAGRA). 25 tablet 2  . ONETOUCH DELICA LANCETS 63F MISC Use to check blood sugars up to 6 times a day. Dx code: 250.60 200 each 6  . oxyCODONE-acetaminophen (PERCOCET/ROXICET) 5-325 MG per tablet Take 1-2 tablets by mouth every 6 (six) hours as needed for moderate pain. 40 tablet 0  . rosuvastatin (CRESTOR) 10 MG tablet Take 1 tablet (10 mg total) by mouth daily. 30 tablet 11  . sildenafil (REVATIO) 20 MG tablet Take 1 tablet (20 mg total) by mouth as needed. Take 2-4 pills as needed for sexual activity 50 tablet 5  . sildenafil (VIAGRA) 100 MG tablet Take 0.5 tablets (50 mg total) by mouth as needed for erectile dysfunction (DO NOT USE IF YOU ARE TAKING NTG). (Patient taking differently: Take 50 mg by mouth at bedtime as needed for erectile dysfunction (DO NOT USE IF YOU ARE  TAKING NTG). ) 20 tablet 1  . VITAMIN D, CHOLECALCIFEROL, PO Take 1 capsule by mouth daily.    . metoCLOPramide (REGLAN) 5 MG tablet Take 1 tablet (5 mg total) by mouth 4 (four) times daily - after meals and at bedtime. 120 tablet 1   No current facility-administered medications for this visit.    Allergies:   Review of patient's allergies indicates no known allergies.    Social History:  The patient  reports that he quit smoking about 41 years ago. His smoking use included Cigarettes. He quit after .2 years  of use. He has never used smokeless tobacco. He reports that he drinks alcohol. He reports that he uses illicit drugs (Marijuana).   Family History:  The patient's family history includes Alcoholism in his father; Diabetes in his mother.    ROS:  Please see the history of present illness.     Patient denies fever, chills, headache, sweats, rash, change in vision, change in hearing, chest pain, cough, nausea or vomiting, urinary symptoms. The patient mentions that he still has some lower abdominal discomfort after his GI surgery. All other systems are reviewed and are negative.   PHYSICAL EXAM: VS:  BP 122/72 mmHg  Pulse 71  Ht _0  (1.854 m)  Wt 214 lb 6.4 oz (97.251 kg)  BMI 28.29 kg/m2  SpO2 97% , Patient is oriented to person time and place. Affect is normal. Head is atraumatic. Sclera and conjunctiva are normal. There is no jugulovenous distention. Lungs are clear. Respiratory effort is not labored. Cardiac exam reveals an S1 and S2. Abdomen is soft. There is no peripheral edema. There are no musculoskeletal deformities. There are no skin rashes.  EKG:   EKG is not done today.   Recent Labs: 11/16/2014: ALT 39 11/17/2014: BUN 12; Creatinine 1.37*; Hemoglobin 13.2; Platelets 152; Potassium 4.0; Sodium 135    Lipid Panel    Component Value Date/Time   CHOL 122 08/05/2014 0820   TRIG 56.0 08/05/2014 0820   HDL 31.80* 08/05/2014 0820   CHOLHDL 4 08/05/2014 0820   VLDL  11.2 08/05/2014 0820   LDLCALC 79 08/05/2014 0820      Wt Readings from Last 3 Encounters:  12/23/14 214 lb 6.4 oz (97.251 kg)  12/02/14 215 lb (97.523 kg)  11/17/14 227 lb 4.8 oz (103.103 kg)      Current medicines are reviewed       ASSESSMENT AND PLAN:

## 2014-12-23 NOTE — Patient Instructions (Signed)
Medication Instructions:  Same  Labwork: None  Testing/Procedures: None  Follow-Up: Your physician wants you to follow-up in: 6 months. You will receive a reminder letter in the mail two months in advance. If you don't receive a letter, please call our office to schedule the follow-up appointment.      

## 2014-12-23 NOTE — Assessment & Plan Note (Signed)
We are trying to help the patient with all sample medications possible.

## 2015-01-20 ENCOUNTER — Telehealth: Payer: Self-pay | Admitting: Dietician

## 2015-01-20 NOTE — Telephone Encounter (Signed)
Out of work, asking for samples of Vgo. suggested he call the company who makes them to see if they have a discount card.  He says his blood sugars are doing well, once in a while in the 300s, but mostly in 100s. 195 today while on the phone.

## 2015-02-10 ENCOUNTER — Other Ambulatory Visit: Payer: Self-pay | Admitting: Internal Medicine

## 2015-02-13 ENCOUNTER — Telehealth: Payer: Self-pay | Admitting: Dietician

## 2015-02-13 ENCOUNTER — Other Ambulatory Visit: Payer: Self-pay | Admitting: *Deleted

## 2015-02-13 NOTE — Telephone Encounter (Signed)
Was enter for refill 02/13/15 AM - pending.

## 2015-02-13 NOTE — Telephone Encounter (Signed)
[facility] [address] supply with enough refills for the year will save him money.

## 2015-02-14 MED ORDER — CARVEDILOL 6.25 MG PO TABS: 6.2500 mg | ORAL_TABLET | Freq: Two times a day (BID) | ORAL | Status: DC

## 2015-02-21 ENCOUNTER — Other Ambulatory Visit: Payer: Self-pay | Admitting: Internal Medicine

## 2015-02-21 MED ORDER — CARVEDILOL 6.25 MG PO TABS: 6.2500 mg | ORAL_TABLET | Freq: Two times a day (BID) | ORAL | Status: DC

## 2015-02-21 NOTE — Telephone Encounter (Signed)
This was done on 6/27

## 2015-02-22 ENCOUNTER — Telehealth: Payer: Self-pay | Admitting: Dietician

## 2015-02-23 NOTE — Telephone Encounter (Signed)
Called for an appointment on same day he sees doctor in August. He is doing well on VGo. He is hoping for an A1C about a 7%. He is trying to change his diet. appointment scheduled for 03/23/15 at 230 pm.

## 2015-03-13 ENCOUNTER — Other Ambulatory Visit: Payer: Self-pay | Admitting: Internal Medicine

## 2015-03-13 NOTE — Telephone Encounter (Signed)
Rx has been filled, pt to recheck with Nicolette Bang

## 2015-03-13 NOTE — Telephone Encounter (Signed)
[facility] on [address]

## 2015-03-23 ENCOUNTER — Encounter: Payer: Self-pay | Admitting: Internal Medicine

## 2015-03-23 ENCOUNTER — Encounter: Payer: Self-pay | Admitting: Dietician

## 2015-03-23 ENCOUNTER — Ambulatory Visit (INDEPENDENT_AMBULATORY_CARE_PROVIDER_SITE_OTHER): Payer: 59 | Admitting: Dietician

## 2015-03-23 ENCOUNTER — Ambulatory Visit (INDEPENDENT_AMBULATORY_CARE_PROVIDER_SITE_OTHER): Payer: 59 | Admitting: Internal Medicine

## 2015-03-23 VITALS — BP 130/70 | HR 69 | Temp 98.0°F | Ht 73.0 in | Wt 215.9 lb

## 2015-03-23 VITALS — Wt 215.9 lb

## 2015-03-23 DIAGNOSIS — Z77128 Contact with and (suspected) exposure to other hazards in the physical environment: Secondary | ICD-10-CM | POA: Diagnosis not present

## 2015-03-23 DIAGNOSIS — Z9189 Other specified personal risk factors, not elsewhere classified: Secondary | ICD-10-CM

## 2015-03-23 DIAGNOSIS — F329 Major depressive disorder, single episode, unspecified: Secondary | ICD-10-CM

## 2015-03-23 DIAGNOSIS — F410 Panic disorder [episodic paroxysmal anxiety] without agoraphobia: Secondary | ICD-10-CM

## 2015-03-23 DIAGNOSIS — F32A Depression, unspecified: Secondary | ICD-10-CM

## 2015-03-23 DIAGNOSIS — G44209 Tension-type headache, unspecified, not intractable: Secondary | ICD-10-CM

## 2015-03-23 DIAGNOSIS — Z713 Dietary counseling and surveillance: Secondary | ICD-10-CM

## 2015-03-23 DIAGNOSIS — Z2089 Contact with and (suspected) exposure to other communicable diseases: Secondary | ICD-10-CM

## 2015-03-23 DIAGNOSIS — E1143 Type 2 diabetes mellitus with diabetic autonomic (poly)neuropathy: Secondary | ICD-10-CM

## 2015-03-23 DIAGNOSIS — B882 Other arthropod infestations: Secondary | ICD-10-CM

## 2015-03-23 DIAGNOSIS — M791 Myalgia, unspecified site: Secondary | ICD-10-CM

## 2015-03-23 DIAGNOSIS — Z9641 Presence of insulin pump (external) (internal): Secondary | ICD-10-CM | POA: Diagnosis not present

## 2015-03-23 LAB — GLUCOSE, CAPILLARY: GLUCOSE-CAPILLARY: 124 mg/dL — AB (ref 65–99)

## 2015-03-23 LAB — POCT GLYCOSYLATED HEMOGLOBIN (HGB A1C): HEMOGLOBIN A1C: 7.9

## 2015-03-23 MED ORDER — BUTALBITAL-APAP-CAFFEINE 50-325-40 MG PO TABS: 1.0000 | ORAL_TABLET | Freq: Four times a day (QID) | ORAL | Status: DC | PRN

## 2015-03-23 MED ORDER — ALPRAZOLAM 1 MG PO TABS: 0.5000 mg | ORAL_TABLET | Freq: Three times a day (TID) | ORAL | Status: DC | PRN

## 2015-03-23 NOTE — Progress Notes (Signed)
Medical Nutrition Therapy:  Appt start time: 1445 end time:  1530.  Assessment:  Primary concerns today: glycemic control.  Patient has been using the VGo 40 and not using all the bolus amounts. He is not feeling well today so kept visit short. He reports having 2-3 clicks or 4-6 units leftover from the day before daily. He wants to learn more about carb counting to better control his blood sugars. A1C decreased to 7.9%. Patient goal is 7-7.5% Recalculated his TDD using 70 units, 50% would be 35 basal and 35 bolus, CF 25-26 and ICR 1:6. Patient weight is staying the same. One of his goals was to decrease his weight. However, he reports he has to keep his sugar about 200 at bedtime to not have a low fasting or over night. He did not bring his meter today He says he has been checking his CBG am and PM before bed  60-200 fasting Goes to bed with CBGs 180-200 Learning Readiness:  Contemplating vs. change in progress Barriers to learning/adherence to lifestyle change: competing values, lack of material resources and support for behavior change  Usual eating pattern includes 2-3 meals and 0-2 snacks per day. Frequent foods and beverages include regular Mt Dew, eats oput often, steakf and fried chicken biscuits. Denies alcohol except on occassions. 24-hr recall: Fastign CBG today unknown B ( 11 AM)- steak and egg biscuit and half and half tea- took 3-4 clicks CBG 62 when he left work ~ 2 Pm drank 12 ounces mt dew- 45 grams carb CBG here in office was 120 at beginning of visit, 96 by the end of MNt visit.   Typical day? Yes.   Usual physical activity includes work Progress Towards Goal(s):  In progress.   Nutritional Diagnosis:  NB-1.1 Food and nutrition-related knowledge deficit As related to lack of prior education about carbn counting and basal testing.  As evidenced by his report.    Intervention:  Nutrition education about basal insulin and its role vs bolus insulin.Carb counting  introduction Coordination of care- discuss consider trail of decreased VGo to 30 Handouts given during visit include:AVS Demonstrated degree of understanding via:  Teach Back   Monitoring/Evaluation:  Dietary intake, exercise, meter, and body weight in 4 week(s).

## 2015-03-23 NOTE — Patient Instructions (Signed)
General Instructions: I want you to take Doxycyline  twice a day for 7 days I will call with the resutls   Please bring your medicines with you each time you come to clinic.  Medicines may include prescription medications, over-the-counter medications, herbal remedies, eye drops, vitamins, or other pills.   Progress Toward Treatment Goals:  Treatment Goal 06/24/2014  Hemoglobin A1C improved  Blood pressure at goal  Prevent falls -    Self Care Goals & Plans:  Self Care Goal 03/23/2015  Manage my medications take my medicines as prescribed; bring my medications to every visit; refill my medications on time  Monitor my health keep track of my blood glucose; keep track of my blood pressure; bring my glucose meter and log to each visit  Eat healthy foods eat more vegetables; eat foods that are low in salt; eat smaller portions  Be physically active find an activity I enjoy    Home Blood Glucose Monitoring 06/24/2014  Check my blood sugar 4 times a day  When to check my blood sugar before meals; at bedtime     Care Management & Community Referrals:  Referral 10/20/2013  Referrals made for care management support none needed

## 2015-03-23 NOTE — Patient Instructions (Signed)
You need about 1 click for every 12 grams carbohydrate  About 60-75 grams carbohydrate for each meal should be about right for you.    To test your basal insulin:  Check fasting sugar Skip breakfast and check every hour until lunch. Eat lunch  Eat breakfast  Check before lunch Skip lunch Check every hour until dinner Eat dinner  Eat breakfast Eat lunch Check before dinner Skip dinner Check every hour until bedtime Eat snack before bed

## 2015-03-24 LAB — CBC WITH DIFFERENTIAL/PLATELET
BASOS ABS: 0 10*3/uL (ref 0.0–0.2)
Basos: 0 %
EOS (ABSOLUTE): 0.6 10*3/uL — ABNORMAL HIGH (ref 0.0–0.4)
EOS: 7 %
HEMOGLOBIN: 14.6 g/dL (ref 12.6–17.7)
Hematocrit: 42 % (ref 37.5–51.0)
IMMATURE GRANS (ABS): 0 10*3/uL (ref 0.0–0.1)
Immature Granulocytes: 0 %
LYMPHS ABS: 2.4 10*3/uL (ref 0.7–3.1)
Lymphs: 25 %
MCH: 28.6 pg (ref 26.6–33.0)
MCHC: 34.8 g/dL (ref 31.5–35.7)
MCV: 82 fL (ref 79–97)
MONOCYTES: 8 %
Monocytes Absolute: 0.8 10*3/uL (ref 0.1–0.9)
NEUTROS PCT: 60 %
Neutrophils Absolute: 5.6 10*3/uL (ref 1.4–7.0)
PLATELETS: 254 10*3/uL (ref 150–379)
RBC: 5.11 x10E6/uL (ref 4.14–5.80)
RDW: 13.9 % (ref 12.3–15.4)
WBC: 9.4 10*3/uL (ref 3.4–10.8)

## 2015-03-24 LAB — COMPREHENSIVE METABOLIC PANEL
A/G RATIO: 1.8 (ref 1.1–2.5)
ALBUMIN: 4.4 g/dL (ref 3.5–5.5)
ALT: 29 IU/L (ref 0–44)
AST: 25 IU/L (ref 0–40)
Alkaline Phosphatase: 94 IU/L (ref 39–117)
BILIRUBIN TOTAL: 0.5 mg/dL (ref 0.0–1.2)
BUN/Creatinine Ratio: 18 (ref 9–20)
BUN: 18 mg/dL (ref 6–24)
CALCIUM: 9.7 mg/dL (ref 8.7–10.2)
CHLORIDE: 98 mmol/L (ref 97–108)
CO2: 24 mmol/L (ref 18–29)
Creatinine, Ser: 1.02 mg/dL (ref 0.76–1.27)
GFR calc Af Amer: 93 mL/min/{1.73_m2} (ref >59)
GFR calc non Af Amer: 80 mL/min/{1.73_m2} (ref >59)
Globulin, Total: 2.4 g/dL (ref 1.5–4.5)
Glucose: 60 mg/dL — ABNORMAL LOW (ref 65–99)
POTASSIUM: 4.3 mmol/L (ref 3.5–5.2)
SODIUM: 141 mmol/L (ref 134–144)
Total Protein: 6.8 g/dL (ref 6.0–8.5)

## 2015-03-26 ENCOUNTER — Encounter: Payer: Self-pay | Admitting: Internal Medicine

## 2015-03-26 DIAGNOSIS — F410 Panic disorder [episodic paroxysmal anxiety] without agoraphobia: Secondary | ICD-10-CM | POA: Insufficient documentation

## 2015-03-26 DIAGNOSIS — G44209 Tension-type headache, unspecified, not intractable: Secondary | ICD-10-CM | POA: Insufficient documentation

## 2015-03-26 NOTE — Assessment & Plan Note (Signed)
Lab Results  Component Value Date   HGBA1C 7.9 03/23/2015   HGBA1C 8.1 12/02/2014   HGBA1C 8.0 08/05/2014    Patient has done well with VGo-40 feels he is really getting use to it, he reports most of his sugars have been well controlled until this week. Assessment: Diabetes control:  fair control Progress toward A1C goal:   unchanged Comments: A1c 7.9, patinet reports his goal is <7%  Plan: Medications:  continue current medications VGO-40 Home glucose monitoring: Frequency:  TID Timing:  before meals Instruction/counseling given: reminded to bring blood glucose meter & log to each visit, reminded to bring medications to each visit and discussed diet Educational resources provided:   Self management tools provided:   Other plans: Patient did have hypoglycemic episode this morning, he notes no other hypoglycemic episodes recently. He notes that most of his sugars are in the 200s range.  He meet with Lupita Leash this morning.  We discussed the possibility of reducing him to the VGO 30 pump to decrease long acting due to hypoglycemia but it seems that this is related to his not eating well with his acute illness, he will try to eat more frequent small meals and check often while he is not feeling well.

## 2015-03-26 NOTE — Assessment & Plan Note (Signed)
Stable continue cymbalta 

## 2015-03-26 NOTE — Assessment & Plan Note (Addendum)
I discussed with him that his travel and hiking in [place] does not put him at risk for lyme disease but he may be at risk for other tick born illness. Given his malaise, fatigue and headaches I fell that it is reasonable to check a CMP as well as prescribe him a 7 day course of doxycycline.  I discussed the most common types of tick born disease in [place] and we agreed on testing for Ehrlichiosis only. - Also check CBC

## 2015-03-26 NOTE — Assessment & Plan Note (Signed)
Controlled with PRN Xanax.

## 2015-03-26 NOTE — Progress Notes (Signed)
[facility] Subjective:   Patient ID: Tyler Wilkinson male   DOB: August 01, 1956 59 y.o.   MRN: [id number]  HPI: Tyler Wilkinson is a 59 y.o. male with a PMH detailed below who presents with CC of headache.  He has multiple medial problems, see dissusion in A&P below for details about these.  He does complain today of a 8/10 headache that starts in his neck and raps around his head to his eyebrows,  He describes the headaches as squeezing and it involves both sides of his head.  He denies neurologic symptoms.  He notes that he does have some association with some diffuse soreness and fatigue.  He really notes that all of this started this weekend.  He took a trip to the mountains of [place] and visited the area near [place], [place].  He did spend time outside and hiking some.  He did not appreciate any tick bites this trip but this he may have had a tick bite about a month ago.  He denies any fever of chills.  He does report some pain in his right knee and left ankle.    Past Medical History  Diagnosis Date  . Hypertension   . Erectile dysfunction   . Dyslipidemia   . CAD (coronary artery disease)     stent RCA 2006 (other residual disease). /  nuclear 2008  no ischemia, inferolateral scar.  . Ejection fraction     EF 55%,echo, 2008, inferolateral hypokinesis,  . Myocardial infarction 2006  . Chronic bronchitis     "anytime I get sick it goes into bronchitis; probably get it q yr"  . Sleep apnea     "not wearing mask" (05/10/2014)  . Type 2 diabetes mellitus with complications dx'd 7680  . Daily headache     "just recently" (05/10/2014)  . Gastroparesis   . Asthma   . Cataract    Current Outpatient Prescriptions  Medication Sig Dispense Refill  . acetaminophen-codeine (TYLENOL #3) 300-30 MG per tablet Take 1 tablet by mouth every 4 (four) hours as needed for moderate pain. 30 tablet 0  . albuterol (PROVENTIL HFA;VENTOLIN HFA) 108 (90 BASE) MCG/ACT inhaler Inhale 2 puffs  into the lungs every 6 (six) hours as needed for wheezing or shortness of breath. 1 Inhaler 2  . ALPRAZolam (XANAX) 1 MG tablet Take 0.5 tablets (0.5 mg total) by mouth 3 (three) times daily as needed for sleep or anxiety. 30 tablet 2  . Arginine 500 MG CAPS Take 500 mg by mouth daily.    Marland Kitchen aspirin EC 81 MG tablet Take 1 tablet (81 mg total) by mouth daily.    . benazepril (LOTENSIN) 40 MG tablet Take 1 tablet (40 mg total) by mouth daily. 90 tablet 3  . Blood Glucose Monitoring Suppl (ONE TOUCH ULTRA 2) W/DEVICE KIT Use to check blood sugars up to 6 times daily 1 each 0  . butalbital-acetaminophen-caffeine (FIORICET) 50-325-40 MG per tablet Take 1-2 tablets by mouth every 6 (six) hours as needed for headache. 30 tablet 0  . carvedilol (COREG) 6.25 MG tablet Take 1 tablet (6.25 mg total) by mouth 2 (two) times daily. 180 tablet 3  . DULoxetine (CYMBALTA) 60 MG capsule Take 1 capsule (60 mg total) by mouth daily. 30 capsule 5  . Flaxseed, Linseed, (FLAX SEED OIL PO) Take by mouth daily.      Marland Kitchen glucose blood (ONE TOUCH ULTRA TEST) test strip Use to check blood sugars 3-4 times daily.  Code: E11.42. 200 each 11  . hydrochlorothiazide (HYDRODIURIL) 25 MG tablet Take 1 tablet (25 mg total) by mouth daily. 60 tablet 5  . Insulin Disposable Pump (V-GO 40) KIT 2 units (1 click) with smaller meals and 3-5 units with larger meals. 1 kit 11  . insulin lispro (HUMALOG) 100 UNIT/ML injection For use for with VGo 40. 40 units of basal and 2 units (1 click) with small meals, 3-5 units with larger meals 10 mL 3  . Insulin Syringe-Needle U-100 (INSULIN SYRINGE .5CC/31GX5/16") 31G X 5/16" 0.5 ML MISC Use to inject insulin 3 times a day Dx code 250.00 100 each 12  . latanoprost (XALATAN) 0.005 % ophthalmic solution Place 1 drop into both eyes at bedtime.    Marland Kitchen levocetirizine (XYZAL) 5 MG tablet Take 1 tablet (5 mg total) by mouth every evening. 30 tablet 11  . metoCLOPramide (REGLAN) 5 MG tablet Take 1 tablet (5 mg  total) by mouth 4 (four) times daily - after meals and at bedtime. 120 tablet 1  . naproxen (NAPROSYN) 500 MG tablet Take 1 tablet (500 mg total) by mouth 2 (two) times daily with a meal. 60 tablet 3  . nitroGLYCERIN (NITROSTAT) 0.4 MG SL tablet Place 1 tablet (0.4 mg total) under the tongue every 5 (five) minutes as needed for chest pain (DO NOT USE IF YOU HAVE TAKEN VIAGRA). 25 tablet 2  . ONETOUCH DELICA LANCETS 80K MISC Use to check blood sugars up to 6 times a day. Dx code: 250.60 200 each 6  . oxyCODONE-acetaminophen (PERCOCET/ROXICET) 5-325 MG per tablet Take 1-2 tablets by mouth every 6 (six) hours as needed for moderate pain. 40 tablet 0  . rosuvastatin (CRESTOR) 10 MG tablet Take 1 tablet (10 mg total) by mouth daily. 30 tablet 11  . sildenafil (REVATIO) 20 MG tablet Take 1 tablet (20 mg total) by mouth as needed. Take 2-4 pills as needed for sexual activity 50 tablet 5  . sildenafil (VIAGRA) 100 MG tablet Take 0.5 tablets (50 mg total) by mouth as needed for erectile dysfunction (DO NOT USE IF YOU ARE TAKING NTG). (Patient taking differently: Take 50 mg by mouth at bedtime as needed for erectile dysfunction (DO NOT USE IF YOU ARE TAKING NTG). ) 20 tablet 1  . VITAMIN D, CHOLECALCIFEROL, PO Take 1 capsule by mouth daily.     No current facility-administered medications for this visit.   Family History  Problem Relation Age of Onset  . Diabetes Mother   . Alcoholism Father    History   Social History  . Marital Status: Single    Spouse Name: N/A  . Number of Children: N/A  . Years of Education: 9   Occupational History  . 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 Smoking status: Former Smoker -- .2 years    Types: Cigarettes    Quit date: 01/26/1973  . Smokeless tobacco: Never Used  . Alcohol Use: 0.0 oz/week    0 Standard drinks or equivalent per week     Comment: Beer rarely.  . Drug Use: Yes    Special: Marijuana     Comment:  Occasionally.  . Sexual Activity: Not on file   Other Topics Concern  . None   Social History Narrative   Review of Systems: Review of Systems  Constitutional: Negative for fever, chills and malaise/fatigue.  HENT: Negative for tinnitus.   Eyes: Negative for blurred vision, double vision and photophobia.  Respiratory: Negative for cough and shortness of breath.   Cardiovascular: Negative for chest pain.  Gastrointestinal: Negative for heartburn, abdominal pain, diarrhea and constipation.  Genitourinary: Negative for dysuria.  Musculoskeletal: Positive for joint pain and neck pain. Negative for back pain and falls.  Skin: Negative for rash.  Neurological: Positive for headaches. Negative for dizziness.  Psychiatric/Behavioral: Negative for depression. The patient is nervous/anxious.      Objective:  Physical Exam: Filed Vitals:   03/23/15 1549  BP: 130/70  Pulse: 69  Temp: 98 F (36.7 C)  TempSrc: Oral  Height: 6' 1"  (1.854 m)  Weight: 97.932 kg (215 lb 14.4 oz)  SpO2: 98%   Physical Exam  Constitutional: He is oriented to person, place, and time and well-developed, well-nourished, and in no distress.  HENT:  Head: Normocephalic and atraumatic.  Eyes: Conjunctivae are normal. Pupils are equal, round, and reactive to light.  Neck: Normal range of motion. Neck supple.  Cardiovascular: Normal rate and regular rhythm.   Pulmonary/Chest: Effort normal and breath sounds normal. No respiratory distress. He has no wheezes.  Abdominal: Soft. Bowel sounds are normal. There is no tenderness.  Musculoskeletal: He exhibits no edema.       Right elbow: Normal.      Left elbow: Normal.       Right wrist: Normal.       Left wrist: Normal.       Right knee: He exhibits normal range of motion, no swelling and no effusion. No tenderness found.       Left knee: He exhibits normal range of motion, no swelling and no effusion. No tenderness found.       Right ankle: He exhibits normal  range of motion and no swelling. No tenderness.       Left ankle: He exhibits normal range of motion and no swelling. No tenderness.       Cervical back: He exhibits tenderness.  Cervical paraspinal tenderness with tenderness at occipital base and frontal insertion.  Lymphadenopathy:    He has no cervical adenopathy.  Neurological: He is alert and oriented to person, place, and time.  Skin: Skin is warm and dry. No rash noted.  No tick bite visable  Psychiatric: Affect normal.  Nursing note and vitals reviewed.   Assessment & Plan:  Case discussed with Dr. 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 Assessment: Diabetes control:  fair control Progress toward A1C goal:   unchanged Comments: A1c 7.9, patinet reports his goal is <7%  Plan: Medications:  continue current medications VGO-40 Home glucose monitoring: Frequency:  TID Timing:  before meals Instruction/counseling given: reminded to bring blood glucose meter & log to each visit, reminded to bring medications to each visit and discussed diet Educational resources provided:   Self management tools provided:   Other plans: Patient did have hypoglycemic episode this morning, he notes no other hypoglycemic episodes recently. He notes that most of his sugars are in the 200s range.  He meet with Butch Penny this morning.  We discussed the possibility of reducing him to the VGO 30 pump to decrease long acting due to hypoglycemia but it seems that this is related to his  not eating well with his acute illness, he will try to eat more frequent small meals and check often while he is not feeling well.     At high risk for tick borne illness I discussed with  him that his travel and hiking in [place] does not put him at risk for lyme disease but he may be at risk for other tick born illness. Given his malaise, fatigue and headaches I fell that it is reasonable to check a CMP as well as prescribe him a 7 day course of doxycycline.  I discussed the most common types of tick born disease in [place] and we agreed on testing for Ehrlichiosis only. - Also check CBC  Depression Stable continue cymbalta.  Panic attacks Controlled with PRN Xanax.  Tension headache His history and exam is consistent with a tension headache. - Advised heat to neck - Rx short course of Fioricet    Medications Ordered Meds ordered this encounter  Medications  . ALPRAZolam (XANAX) 1 MG tablet    Sig: Take 0.5 tablets (0.5 mg total) by mouth 3 (three) times daily as needed for sleep or anxiety.    Dispense:  30 tablet    Refill:  2  . butalbital-acetaminophen-caffeine (FIORICET) 50-325-40 MG per tablet    Sig: Take 1-2 tablets by mouth every 6 (six) hours as needed for headache.    Dispense:  30 tablet    Refill:  0   Other Orders Orders Placed This Encounter  Procedures  . Glucose, capillary  . Comprehensive Metabolic Panel w/GFR  . CBC with Diff  . Ehrlichia Antibody Panel  . POCT HgB A1C   Follow Up: Return in about 3 months (around 06/23/2015), or if symptoms worsen or fail to improve.

## 2015-03-26 NOTE — Assessment & Plan Note (Signed)
His history and exam is consistent with a tension headache. - Advised heat to neck - Rx short course of Fioricet

## 2015-03-27 ENCOUNTER — Telehealth: Payer: Self-pay | Admitting: Dietician

## 2015-03-27 MED ORDER — DOXYCYCLINE HYCLATE 100 MG PO TABS: 100.0000 mg | ORAL_TABLET | Freq: Two times a day (BID) | ORAL | Status: DC

## 2015-03-27 NOTE — Progress Notes (Signed)
Internal Medicine Clinic Attending  I saw and evaluated the patient.  I personally confirmed the key portions of the history and exam documented by Dr. Hoffman and I reviewed pertinent patient test results.  The assessment, diagnosis, and plan were formulated together and I agree with the documentation in the resident's note.      

## 2015-03-27 NOTE — Telephone Encounter (Signed)
Patient is asking about the antibiotic and his xanax rxs. He called two pharmacies and says they do not have them. Told him the xanax was sent in and to the [facility] pharmacy. Do not se an antibiotic. He prefers rxs to be sent to [facility] on [address]. He also reports that he went to bed with a CBG of 180 and awake with a CBG of 120. Discussed that basal ideally should not lower or allow to increase CBG more than 30 mg/dl. Patient will continue to monitor his basal.

## 2015-03-28 NOTE — Telephone Encounter (Signed)
Checked with [facility] on generic Xanax from 03/23/15 - no record. Rx called in to pharmacy. Pt aware that generic Xanax and antibiotic will be ready today after lunch at [facility].

## 2015-03-30 LAB — EHRLICHIA ANTIBODY PANEL
E. CHAFFEENSIS (HME) IGM TITER: NEGATIVE
E. CHAFFEENSIS IGG AB: NEGATIVE
HGE IGG TITER: NEGATIVE
HGE IgM Titer: NEGATIVE

## 2015-04-30 ENCOUNTER — Other Ambulatory Visit: Payer: Self-pay | Admitting: Internal Medicine

## 2015-06-08 ENCOUNTER — Other Ambulatory Visit: Payer: Self-pay | Admitting: *Deleted

## 2015-06-08 DIAGNOSIS — I10 Essential (primary) hypertension: Secondary | ICD-10-CM

## 2015-06-09 MED ORDER — BENAZEPRIL HCL 40 MG PO TABS: 40.0000 mg | ORAL_TABLET | Freq: Every day | ORAL | Status: DC

## 2015-06-21 ENCOUNTER — Other Ambulatory Visit: Payer: Self-pay | Admitting: *Deleted

## 2015-06-21 DIAGNOSIS — E1143 Type 2 diabetes mellitus with diabetic autonomic (poly)neuropathy: Secondary | ICD-10-CM

## 2015-06-22 MED ORDER — GLUCOSE BLOOD VI STRP: ORAL_STRIP | Status: DC

## 2015-06-25 ENCOUNTER — Other Ambulatory Visit: Payer: Self-pay | Admitting: Internal Medicine

## 2015-06-26 ENCOUNTER — Telehealth: Payer: Self-pay | Admitting: Dietician

## 2015-06-26 NOTE — Telephone Encounter (Signed)
Informed patient per his request that his test strip presription was sent in to [facility]. Also reminded him to schedule an appointment with his PCP as soon as possible.

## 2015-07-05 ENCOUNTER — Telehealth: Payer: Self-pay | Admitting: Dietician

## 2015-07-05 NOTE — Telephone Encounter (Signed)
Says he will be here this Friday for the samples of Novolog that were signed out to him and that he schedule an appointment with Dr. Mikey BussingHoffman. He would like to speak to Dr. Mikey BussingHoffman about getting the shingles vaccine.  He reports that [facility] [address]. CDE called and requested docuementation

## 2015-07-06 ENCOUNTER — Other Ambulatory Visit: Payer: Self-pay | Admitting: Internal Medicine

## 2015-07-06 DIAGNOSIS — J302 Other seasonal allergic rhinitis: Secondary | ICD-10-CM

## 2015-07-06 MED ORDER — LEVOCETIRIZINE DIHYDROCHLORIDE 5 MG PO TABS: 5.0000 mg | ORAL_TABLET | Freq: Every evening | ORAL | Status: DC

## 2015-07-06 NOTE — Telephone Encounter (Signed)
Patient requesting a refill on his Levocetrizine for Allergy.  [facility] [address] [facility].  [facility] [address] [place].

## 2015-07-06 NOTE — Telephone Encounter (Signed)
I will not be in clinic on Friday, if you could let Mr Tyler Wilkinson know that I would recommend the shingles vaccine for him when he turns 59 years old.  We do not provide the vaccine here but [facility] and have [facility] us a record.  I have printed some info for him to review about the shingles vaccine.

## 2015-07-07 ENCOUNTER — Encounter: Payer: Self-pay | Admitting: Pharmacist

## 2015-07-07 NOTE — Progress Notes (Signed)
Patient ID: Tyler SleighDavid W Wilkinson, male   DOB: 07/14/1956, 59 y.o.   MRN: [id number]  Medication Samples have been provided to the patient.  Drug: Insulin aspart (Novolog) Strength: 10 units/mL Qty: 20 mL LOT: [id number] Exp.Date: 12/16  The patient has been instructed regarding the correct time, dose, and frequency of taking this medication, including desired effects and most common side effects.   Kim,Jennifer J 1:45 PM 07/07/2015

## 2015-07-09 ENCOUNTER — Other Ambulatory Visit: Payer: Self-pay | Admitting: Internal Medicine

## 2015-07-12 ENCOUNTER — Other Ambulatory Visit: Payer: Self-pay | Admitting: Internal Medicine

## 2015-07-12 NOTE — Telephone Encounter (Signed)
[facility] from [facility] requesting Humalog to be filled.

## 2015-07-12 NOTE — Telephone Encounter (Signed)
Request already pending via [facility]

## 2015-07-27 ENCOUNTER — Ambulatory Visit (INDEPENDENT_AMBULATORY_CARE_PROVIDER_SITE_OTHER): Payer: 59 | Admitting: Internal Medicine

## 2015-07-27 ENCOUNTER — Encounter: Payer: Self-pay | Admitting: Internal Medicine

## 2015-07-27 VITALS — BP 132/67 | HR 80 | Temp 97.6°F | Ht 72.5 in | Wt 219.5 lb

## 2015-07-27 DIAGNOSIS — H6123 Impacted cerumen, bilateral: Secondary | ICD-10-CM | POA: Diagnosis not present

## 2015-07-27 DIAGNOSIS — Z23 Encounter for immunization: Secondary | ICD-10-CM

## 2015-07-27 DIAGNOSIS — Z9641 Presence of insulin pump (external) (internal): Secondary | ICD-10-CM

## 2015-07-27 DIAGNOSIS — E1143 Type 2 diabetes mellitus with diabetic autonomic (poly)neuropathy: Secondary | ICD-10-CM | POA: Diagnosis not present

## 2015-07-27 DIAGNOSIS — X58XXXA Exposure to other specified factors, initial encounter: Secondary | ICD-10-CM

## 2015-07-27 DIAGNOSIS — I10 Essential (primary) hypertension: Secondary | ICD-10-CM | POA: Diagnosis not present

## 2015-07-27 DIAGNOSIS — S93401A Sprain of unspecified ligament of right ankle, initial encounter: Secondary | ICD-10-CM

## 2015-07-27 LAB — GLUCOSE, CAPILLARY: Glucose-Capillary: 100 mg/dL — ABNORMAL HIGH (ref 65–99)

## 2015-07-27 LAB — POCT GLYCOSYLATED HEMOGLOBIN (HGB A1C): Hemoglobin A1C: 7.7

## 2015-07-27 MED ORDER — HYDROCHLOROTHIAZIDE 25 MG PO TABS: 25.0000 mg | ORAL_TABLET | Freq: Every day | ORAL | Status: DC

## 2015-07-27 MED ORDER — CITALOPRAM HYDROBROMIDE 20 MG PO TABS: 20.0000 mg | ORAL_TABLET | Freq: Every day | ORAL | Status: DC

## 2015-07-27 MED ORDER — V-GO 30 KIT: 1.0000 [IU] | PACK | Freq: Every day | Status: DC

## 2015-07-27 NOTE — Patient Instructions (Signed)
General Instructions:  I want you to go down to VGo -30 Please bring your medicines with you each time you come to clinic.  Medicines may include prescription medications, over-the-counter medications, herbal remedies, eye drops, vitamins, or other pills.   Progress Toward Treatment Goals:  Treatment Goal 06/24/2014  Hemoglobin A1C improved  Blood pressure at goal  Prevent falls -    Self Care Goals & Plans:  Self Care Goal 07/27/2015  Manage my medications take my medicines as prescribed; refill my medications on time  Monitor my health check my feet daily  Eat healthy foods eat more vegetables; eat foods that are low in salt; eat baked foods instead of fried foods  Be physically active find an activity I enjoy    Home Blood Glucose Monitoring 06/24/2014  Check my blood sugar 4 times a day  When to check my blood sugar before meals; at bedtime     Care Management & Community Referrals:  Referral 10/20/2013  Referrals made for care management support none needed

## 2015-07-27 NOTE — Progress Notes (Signed)
[facility] Subjective:   Patient ID: Tyler Wilkinson male   DOB: May 29, 1956 59 y.o.   MRN: [id number]  HPI: Tyler Wilkinson is a 59 y.o. male with a PMH detailed below who presents for 3 month follow up of DM and HTN  Please see A&P below for the status of his chronic medical problems.    Past Medical History  Diagnosis Date  . Hypertension   . Erectile dysfunction   . Dyslipidemia   . CAD (coronary artery disease)     stent RCA 2006 (other residual disease). /  nuclear 2008  no ischemia, inferolateral scar.  . Ejection fraction     EF 55%,echo, 2008, inferolateral hypokinesis,  . Myocardial infarction ([place]) 2006  . Chronic bronchitis ([place])     "anytime I get sick it goes into bronchitis; probably get it q yr"  . Sleep apnea     "not wearing mask" (05/10/2014)  . Type 2 diabetes mellitus with complications ([place]) dx'd 1972  . Daily headache     "just recently" (05/10/2014)  . Gastroparesis   . Asthma   . Cataract    Current Outpatient Prescriptions  Medication Sig Dispense Refill  . acetaminophen-codeine (TYLENOL #3) 300-30 MG per tablet Take 1 tablet by mouth every 4 (four) hours as needed for moderate pain. 30 tablet 0  . albuterol (PROVENTIL HFA;VENTOLIN HFA) 108 (90 BASE) MCG/ACT inhaler Inhale 2 puffs into the lungs every 6 (six) hours as needed for wheezing or shortness of breath. 1 Inhaler 2  . ALPRAZolam (XANAX) 1 MG tablet Take 0.5 tablets (0.5 mg total) by mouth 3 (three) times daily as needed for sleep or anxiety. 30 tablet 2  . Arginine 500 MG CAPS Take 500 mg by mouth daily.    Marland Kitchen aspirin EC 81 MG tablet Take 1 tablet (81 mg total) by mouth daily.    . benazepril (LOTENSIN) 40 MG tablet Take 1 tablet (40 mg total) by mouth daily. 90 tablet 3  . Blood Glucose Monitoring Suppl (ONE TOUCH ULTRA 2) W/DEVICE KIT Use to check blood sugars up to 6 times daily 1 each 0  . butalbital-acetaminophen-caffeine (FIORICET) 50-325-40 MG per tablet Take 1-2  tablets by mouth every 6 (six) hours as needed for headache. 30 tablet 0  . carvedilol (COREG) 6.25 MG tablet Take 1 tablet (6.25 mg total) by mouth 2 (two) times daily. 180 tablet 3  . doxycycline (VIBRA-TABS) 100 MG tablet Take 1 tablet (100 mg total) by mouth 2 (two) times daily. 14 tablet 0  . DULoxetine (CYMBALTA) 60 MG capsule Take 1 capsule (60 mg total) by mouth daily. 30 capsule 5  . Flaxseed, Linseed, (FLAX SEED OIL PO) Take by mouth daily.      Marland Kitchen HUMALOG 100 UNIT/ML injection USE WITH V-GO 40: USE 40 UNITS BASAL, 2 UNITS WITH SMALL MEALS, AND 3-5 UNITS WITH LARGER MEALS 30 mL 1  . hydrochlorothiazide (HYDRODIURIL) 25 MG tablet Take 1 tablet (25 mg total) by mouth daily. 60 tablet 5  . Insulin Disposable Pump (V-GO 40) KIT 2 units (1 click) with smaller meals and 3-5 units with larger meals. 1 kit 11  . Insulin Syringe-Needle U-100 (INSULIN SYRINGE .5CC/31GX5/16") 31G X 5/16" 0.5 ML MISC Use to inject insulin 3 times a day Dx code 250.00 100 each 12  . latanoprost (XALATAN) 0.005 % ophthalmic solution Place 1 drop into both eyes at bedtime.    Marland Kitchen levocetirizine (XYZAL) 5 MG tablet Take 1 tablet (  5 mg total) by mouth every evening. 30 tablet 11  . metoCLOPramide (REGLAN) 5 MG tablet Take 1 tablet (5 mg total) by mouth 4 (four) times daily - after meals and at bedtime. 120 tablet 1  . naproxen (NAPROSYN) 500 MG tablet Take 1 tablet (500 mg total) by mouth 2 (two) times daily with a meal. 60 tablet 3  . nitroGLYCERIN (NITROSTAT) 0.4 MG SL tablet Place 1 tablet (0.4 mg total) under the tongue every 5 (five) minutes as needed for chest pain (DO NOT USE IF YOU HAVE TAKEN VIAGRA). 25 tablet 2  . ONE TOUCH ULTRA TEST test strip TEST SIX TIMES A DAY 200 each 11  . ONETOUCH DELICA LANCETS 25Z MISC Use to check blood sugars up to 6 times a day. Dx code: 250.60 200 each 6  . oxyCODONE-acetaminophen (PERCOCET/ROXICET) 5-325 MG per tablet Take 1-2 tablets by mouth every 6 (six) hours as needed for  moderate pain. 40 tablet 0  . rosuvastatin (CRESTOR) 10 MG tablet Take 1 tablet (10 mg total) by mouth daily. 30 tablet 11  . sildenafil (REVATIO) 20 MG tablet Take 1 tablet (20 mg total) by mouth as needed. Take 2-4 pills as needed for sexual activity 50 tablet 5  . sildenafil (VIAGRA) 100 MG tablet Take 0.5 tablets (50 mg total) by mouth as needed for erectile dysfunction (DO NOT USE IF YOU ARE TAKING NTG). (Patient taking differently: Take 50 mg by mouth at bedtime as needed for erectile dysfunction (DO NOT USE IF YOU ARE TAKING NTG). ) 20 tablet 1  . VITAMIN D, CHOLECALCIFEROL, PO Take 1 capsule by mouth daily.     No current facility-administered medications for this visit.   Family History  Problem Relation Age of Onset  . Diabetes Mother   . Alcoholism Father    Social History   Social History  . Marital Status: Single    Spouse Name: N/A  . Number of Children: N/A  . Years of Education: 9   Occupational History  . 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 Smoking status: Former Smoker -- .2 years    Types: Cigarettes    Quit date: 01/26/1973  . Smokeless tobacco: Never Used  . Alcohol Use: 0.0 oz/week    0 Standard drinks or equivalent per week     Comment: Beer rarely.  . Drug Use: Yes    Special: Marijuana     Comment: Occasionally.  . Sexual Activity: Not Asked   Other Topics Concern  . None   Social History Narrative   Review of Systems: Review of Systems  Constitutional: Negative for fever and chills.  HENT: Positive for congestion. Negative for ear discharge and ear pain.   Eyes: Negative for blurred vision and double vision.  Respiratory: Negative for cough and shortness of breath.   Cardiovascular: Negative for chest pain and leg swelling.  Gastrointestinal: Negative for abdominal pain.  Genitourinary: Positive for frequency (yesterday associated with hyperglycemia).  Musculoskeletal: Negative for myalgias.    Neurological: Positive for headaches.  Endo/Heme/Allergies: Negative for polydipsia.     Objective:  Physical Exam: Filed Vitals:   07/27/15 1632  BP: 132/67  Pulse: 80  Temp: 97.6 F (36.4 C)  TempSrc: Oral  Height: 6' 0.5" (1.842 m)  Weight: 219 lb 8 oz (99.565 kg)  SpO2: 100%  Physical Exam  Constitutional: He is well-developed, well-nourished, and in no distress.  HENT:  Cerumen impaction bilaterally  Cardiovascular:  Normal rate and regular rhythm.   Pulmonary/Chest: Effort normal and breath sounds normal.  Abdominal:  Patient is wearing 2 V-GO40 devices  Musculoskeletal:       Right ankle: He exhibits normal range of motion, no swelling and no deformity. Tenderness. AITFL tenderness found. No lateral malleolus and no medial malleolus tenderness found.       Feet:  Nursing note and vitals reviewed.   Assessment & Plan:  Case discussed with Dr. 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  Our Diabetes educator has aptly noted that his sugars are dropping >30 from QHS testing to AM testing.  He does not bring his meter today.  He does report some hypoglycemia.  He also notes that he had polyuria yesterday and realized he had forgot to replace his V-GO and had run our of basal insulin.  Today his sugars are improved, he does have 2 V-GOs on but he reports the basal is out on the one from yesterday (he is using the extra bolus clicks to attain control of his sugar)  A: Controlled Type 2 DM with peripheral neuropathy  P: I am concerned about hypoglycemia, I was on the fence at his last visit to change him to a lower basal rate but now I will go ahead and try to reduce him to V-GO30.  I have instructed him that I would NOT recommend having 2  devices on at the same time as this increases the risk of getting double basal and getting hypoglycemic.  If he has hyperglycemia we discussed using his short acting humalog and a sliding scale to bring down the sugars.  CERUMEN IMPACTION May be contributing to his stuffy feeling.  -Lavage by nursing staff  Sprain of right ankle HPI: He reports he has been having some ankle soreness and tenderness especially with certain movements.  He does not remember rolling his ankle or any trauma.  He has tenderness of the anterior talofibular ligament.  A: Right ankle sprain  P: Discussed RICE treatment.  Continue to wear shoes with good ankle support.    Medications Ordered Meds ordered this encounter  Medications  . citalopram (CELEXA) 20 MG tablet    Sig: Take 1 tablet (20 mg total) by mouth daily.    Dispense:  30 tablet    Refill:  2  . Insulin Disposable Pump (V-GO 30) KIT    Sig: 1 Units by Does not apply route daily.    Dispense:  30 kit    Refill:  11  . hydrochlorothiazide (HYDRODIURIL) 25 MG tablet    Sig: Take 1 tablet (25 mg total) by mouth daily.    Dispense:  90 tablet    Refill:  3   Other Orders Orders Placed This Encounter  Procedures  . Pneumococcal conjugate vaccine 13-valent  . Glucose, capillary  . POC Hbg A1C   Follow Up: Return in about 3 months (around 10/25/2015).

## 2015-07-28 NOTE — Assessment & Plan Note (Addendum)
HPI: Patient has been using V-GO 40.  Our Diabetes educator has aptly noted that his sugars are dropping >30 from QHS testing to AM testing.  He does not bring his meter today.  He does report some hypoglycemia.  He also notes that he had polyuria yesterday and realized he had forgot to replace his V-GO and had run our of basal insulin.  Today his sugars are improved, he does have 2 V-GOs on but he reports the basal is out on the one from yesterday (he is using the extra bolus clicks to attain control of his sugar)  A: Controlled Type 2 DM with peripheral neuropathy  P: I am concerned about hypoglycemia, I was on the fence at his last visit to change him to a lower basal rate but now I will go ahead and try to reduce him to V-GO30.  I have instructed him that I would NOT recommend having 2 devices on at the same time as this increases the risk of getting double basal and getting hypoglycemic.  If he has hyperglycemia we discussed using his short acting humalog and a sliding scale to bring down the sugars.

## 2015-07-28 NOTE — Assessment & Plan Note (Signed)
May be contributing to his stuffy feeling.  -Lavage by nursing staff

## 2015-07-28 NOTE — Assessment & Plan Note (Signed)
HPI: He reports he has been having some ankle soreness and tenderness especially with certain movements.  He does not remember rolling his ankle or any trauma.  He has tenderness of the anterior talofibular ligament.  A: Right ankle sprain  P: Discussed RICE treatment.  Continue to wear shoes with good ankle support.

## 2015-07-28 NOTE — Assessment & Plan Note (Signed)
HPI: No complaints, tolerating antihypertensives  A: Essential Hypertension  P:  Continue Benazepril 40mg  daily, Coreg 6.25mg  BID, HCTZ 25mg  daily

## 2015-08-07 ENCOUNTER — Telehealth: Payer: Self-pay | Admitting: Internal Medicine

## 2015-08-07 DIAGNOSIS — Z794 Long term (current) use of insulin: Secondary | ICD-10-CM

## 2015-08-07 DIAGNOSIS — J069 Acute upper respiratory infection, unspecified: Secondary | ICD-10-CM

## 2015-08-07 DIAGNOSIS — E1140 Type 2 diabetes mellitus with diabetic neuropathy, unspecified: Secondary | ICD-10-CM

## 2015-08-07 NOTE — Progress Notes (Signed)
Case discussed with Dr. Hoffman at the time of the visit.  We reviewed the resident's history and exam and pertinent patient test results.  I agree with the assessment, diagnosis and plan of care documented in the resident's note. 

## 2015-08-07 NOTE — Telephone Encounter (Signed)
Pt requesting albuterol inhaler and test strips to be filled @ [facility] on [address]. Pt states he is checking his blood sugar 7-8 times a day.

## 2015-08-08 ENCOUNTER — Encounter: Payer: 59 | Admitting: Internal Medicine

## 2015-08-08 MED ORDER — ONETOUCH DELICA LANCETS 33G MISC: Status: AC

## 2015-08-08 MED ORDER — GLUCOSE BLOOD VI STRP: 1.0000 | ORAL_STRIP | Freq: Three times a day (TID) | Status: DC

## 2015-08-08 MED ORDER — ALBUTEROL SULFATE HFA 108 (90 BASE) MCG/ACT IN AERS: 2.0000 | INHALATION_SPRAY | Freq: Four times a day (QID) | RESPIRATORY_TRACT | Status: DC | PRN

## 2015-08-08 NOTE — Telephone Encounter (Signed)
Have called pt twice, lm for rtc

## 2015-08-08 NOTE — Telephone Encounter (Signed)
He was apparently testing 6 times a day. He has had some hypoglycemia but not severe, I will have him scale back some to testing just 4 times a day.   I have sent in refills.

## 2015-08-23 NOTE — Telephone Encounter (Signed)
Called again no answer  

## 2015-08-25 ENCOUNTER — Other Ambulatory Visit: Payer: Self-pay | Admitting: Internal Medicine

## 2015-08-28 ENCOUNTER — Telehealth: Payer: Self-pay | Admitting: Dietician

## 2015-08-28 NOTE — Telephone Encounter (Signed)
i have called pt and lm on his vmail for rtc

## 2015-08-28 NOTE — Telephone Encounter (Signed)
Requesting CDE give message to nurse that he would like 90 day supply for 1 year refills on hctz and [facility] [address] [facility]. It appears he has refill son the HCTZ but he says the pharmacy told him he didn't. The [facility], so [facility] [place].

## 2015-08-28 NOTE — Telephone Encounter (Signed)
This was just filled for a year supply on 07/27/15.   At this number of pills, he would have a 3/4 year supply in 1 fill.  I don't think that is possible?   Can you find out if he needs a new Rx for the new year?  Thanks  EBM

## 2015-09-15 ENCOUNTER — Other Ambulatory Visit: Payer: Self-pay | Admitting: Internal Medicine

## 2015-09-18 NOTE — Telephone Encounter (Signed)
I had reduced him to the V-Go 30 device (30 units of basal insulin) this refill request is for the V-Go 40 device (40 units of basal insulin), could you call and find out which one he is using, if it is the 40, is he still having low blood sugars?  -Rolm Gala

## 2015-09-21 ENCOUNTER — Telehealth: Payer: Self-pay | Admitting: Dietician

## 2015-09-21 DIAGNOSIS — I10 Essential (primary) hypertension: Secondary | ICD-10-CM

## 2015-09-21 DIAGNOSIS — E1143 Type 2 diabetes mellitus with diabetic autonomic (poly)neuropathy: Secondary | ICD-10-CM

## 2015-09-21 NOTE — Telephone Encounter (Signed)
He is using vgo 30 and the same amount of insulin.  Wants a 10 am lab appointment for a1c, cholesterol and kidney- whatever other labs you want him to have, he wants to come in fasting. He'd like to come in mid to late march to also get his shingles vax prescription then.

## 2015-09-21 NOTE — Telephone Encounter (Signed)
Calls saying he is waiting to come in until after his birthday. Needs novolog insulin refill for at least 2 months. The emr says there is already a prescription pending.

## 2015-09-22 MED ORDER — INSULIN LISPRO 100 UNIT/ML ~~LOC~~ SOLN: SUBCUTANEOUS | Status: DC

## 2015-09-22 NOTE — Telephone Encounter (Signed)
I ordered the insulin, placed future lab orders with prelim date of March 15th.

## 2015-09-22 NOTE — Addendum Note (Signed)
Addended by: Gust Rung on: 09/22/2015 09:41 AM   Modules accepted: Orders

## 2015-10-02 NOTE — Telephone Encounter (Signed)
Refill complete 

## 2015-11-16 ENCOUNTER — Telehealth: Payer: Self-pay | Admitting: Dietician

## 2015-11-16 NOTE — Telephone Encounter (Signed)
I think patient mainly wanted to know when Dr. Mikey BussingHoffman will be in the office. The front office number was busy- he decided to call back. He also mentioned and asked about tetanus and shingles vaccines as well as talked about his frustration that his insurance will not pay for the VGo- suggested he call his insurance company and challenge them. He mentioned that his pharmacy  Can give him the shingles vaccine. Also offered patient a copay card for the VGo that will bring the cost to 80$ per month.

## 2015-11-20 ENCOUNTER — Encounter: Payer: Self-pay | Admitting: Internal Medicine

## 2015-11-20 ENCOUNTER — Ambulatory Visit (INDEPENDENT_AMBULATORY_CARE_PROVIDER_SITE_OTHER): Payer: 59 | Admitting: Internal Medicine

## 2015-11-20 VITALS — BP 110/66 | HR 81 | Temp 98.2°F | Ht 77.0 in | Wt 217.1 lb

## 2015-11-20 DIAGNOSIS — R05 Cough: Secondary | ICD-10-CM | POA: Diagnosis not present

## 2015-11-20 DIAGNOSIS — E1143 Type 2 diabetes mellitus with diabetic autonomic (poly)neuropathy: Secondary | ICD-10-CM

## 2015-11-20 DIAGNOSIS — R5381 Other malaise: Secondary | ICD-10-CM | POA: Diagnosis not present

## 2015-11-20 DIAGNOSIS — J029 Acute pharyngitis, unspecified: Secondary | ICD-10-CM

## 2015-11-20 DIAGNOSIS — R197 Diarrhea, unspecified: Secondary | ICD-10-CM

## 2015-11-20 DIAGNOSIS — J069 Acute upper respiratory infection, unspecified: Secondary | ICD-10-CM

## 2015-11-20 DIAGNOSIS — E1165 Type 2 diabetes mellitus with hyperglycemia: Secondary | ICD-10-CM | POA: Diagnosis not present

## 2015-11-20 DIAGNOSIS — J3489 Other specified disorders of nose and nasal sinuses: Secondary | ICD-10-CM | POA: Diagnosis not present

## 2015-11-20 LAB — POCT GLYCOSYLATED HEMOGLOBIN (HGB A1C): HEMOGLOBIN A1C: 8.1

## 2015-11-20 LAB — GLUCOSE, CAPILLARY: Glucose-Capillary: 286 mg/dL — ABNORMAL HIGH (ref 65–99)

## 2015-11-20 MED ORDER — TRIAMCINOLONE ACETONIDE 55 MCG/ACT NA AERO: 2.0000 | INHALATION_SPRAY | Freq: Every day | NASAL | Status: DC

## 2015-11-20 NOTE — Progress Notes (Signed)
Subjective:   Patient ID: Tyler Wilkinson male   DOB: 07/05/1956 60 y.o.   MRN: [id number]  HPI: Tyler Wilkinson is a 60 y.o. with past medical history as outlined below who presents to clinic for an acute visit for not feeling well since Friday, 3 days ago. Since Friday he has had a dry cough and sinus pressure. He also notes a sore throat, fatigue, chills, diarrhea, and decreased appetite. Denies n/v, fevers. 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   Please see problem list for status of the pt's chronic medical problems.  Past Medical History  Diagnosis Date  . Hypertension   . Erectile dysfunction   . Dyslipidemia   . CAD (coronary artery disease)     stent RCA 2006 (other residual disease). /  nuclear 2008  no ischemia, inferolateral scar.  . Ejection fraction     EF 55%,echo, 2008, inferolateral hypokinesis,  . Myocardial infarction ([place]) 2006  . Chronic bronchitis ([place])     "anytime I get sick it goes into bronchitis; probably get it q yr"  . Sleep apnea     "not wearing mask" (05/10/2014)  . Type 2 diabetes mellitus with complications ([place]) dx'd 1972  . Daily headache     "just recently" (05/10/2014)  . Gastroparesis   . Asthma   . Cataract    Current Outpatient Prescriptions  Medication Sig Dispense Refill  . albuterol (PROVENTIL HFA;VENTOLIN HFA) 108 (90 BASE) MCG/ACT inhaler Inhale 2 puffs into the lungs every 6 (six) hours as needed for wheezing or shortness of breath. 1 Inhaler 1  . ALPRAZolam (XANAX) 1 MG tablet Take 0.5 tablets (0.5 mg total) by mouth 3 (three) times daily as needed for sleep or anxiety. 30 tablet 2  . Arginine 500 MG CAPS Take 500 mg by mouth daily.    Marland Kitchen aspirin EC 81 MG tablet Take 1 tablet (81 mg total) by mouth daily.    . benazepril (LOTENSIN) 40 MG tablet Take 1 tablet (40 mg total) by mouth daily. 90 tablet 3  . Blood Glucose Monitoring Suppl (ONE TOUCH ULTRA 2) W/DEVICE KIT Use  to check blood sugars up to 6 times daily 1 each 0  . carvedilol (COREG) 6.25 MG tablet Take 1 tablet (6.25 mg total) by mouth 2 (two) times daily. 180 tablet 3  . citalopram (CELEXA) 20 MG tablet Take 1 tablet (20 mg total) by mouth daily. 30 tablet 2  . Flaxseed, Linseed, (FLAX SEED OIL PO) Take by mouth daily.      Marland Kitchen glucose blood (ONE TOUCH ULTRA TEST) test strip 1 each by Other route 4 (four) times daily -  before meals and at bedtime. Use as instructed 200 each 11  . hydrochlorothiazide (HYDRODIURIL) 25 MG tablet Take 1 tablet (25 mg total) by mouth daily. 90 tablet 3  . Insulin Disposable Pump (V-GO 30) KIT 1 Units by Does not apply route daily. 30 kit 11  . insulin lispro (HUMALOG) 100 UNIT/ML injection Use with V-GO 30, use 30 units basal, 2 units with small meals and 4-6 units with larger meals 30 mL 5  . Insulin Syringe-Needle U-100 (INSULIN SYRINGE .5CC/31GX5/16") 31G X 5/16" 0.5 ML MISC Use to inject insulin 3 times a day Dx code 250.00 100 each 12  . latanoprost (XALATAN) 0.005 % ophthalmic solution Place 1 drop into both eyes at bedtime.    Marland Kitchen levocetirizine (XYZAL)  5 MG tablet Take 1 tablet (5 mg total) by mouth every evening. 30 tablet 11  . nitroGLYCERIN (NITROSTAT) 0.4 MG SL tablet Place 1 tablet (0.4 mg total) under the tongue every 5 (five) minutes as needed for chest pain (DO NOT USE IF YOU HAVE TAKEN VIAGRA). 25 tablet 2  . ONETOUCH DELICA LANCETS 97L MISC Use to check blood sugars up to 4 times a day. Dx code:E11.65 200 each 11  . rosuvastatin (CRESTOR) 10 MG tablet Take 1 tablet (10 mg total) by mouth daily. 30 tablet 11  . sildenafil (REVATIO) 20 MG tablet Take 1 tablet (20 mg total) by mouth as needed. Take 2-4 pills as needed for sexual activity 50 tablet 5  . VITAMIN D, CHOLECALCIFEROL, PO Take 1 capsule by mouth daily.     No current facility-administered medications for this visit.   Family History  Problem Relation Age of Onset  . Diabetes Mother   . Alcoholism  Father    Social History   Social History  . Marital Status: Single    Spouse Name: N/A  . Number of Children: N/A  . 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 Smokeless tobacco: Never Used  . Alcohol Use: 0.0 oz/week    0 Standard drinks or equivalent per week     Comment: Beer rarely.  . Drug Use: Yes    Special: Marijuana     Comment: Occasionally.  . Sexual Activity: Not on file   Other Topics Concern  . Not on file   Social History Narrative   Review of Systems: Review of Systems  Constitutional: Positive for chills and malaise/fatigue. Negative for fever.  HENT: Positive for sore throat. Negative for congestion.        Sinus pressure   Eyes: Positive for pain (eye pressure). Negative for blurred vision.  Respiratory: Positive for cough. Negative for sputum production and shortness of breath.    Objective:  Physical Exam: There were no vitals filed for this visit. Physical Exam  Constitutional: He appears well-developed and well-nourished. No distress.  HENT:  Head: Normocephalic and atraumatic.  Mouth/Throat: Oropharynx is clear and moist. No oropharyngeal exudate.  Eyes: Conjunctivae and EOM are normal. Right eye exhibits no discharge. Left eye exhibits no discharge. No scleral icterus.  Able to read size 12 font during exam, vision unchanged compared to one month ago   Neck: Neck supple.  Throat tender to palpation   Cardiovascular: Normal rate, regular rhythm and normal heart sounds.  Exam reveals no gallop and no friction rub.   No murmur heard. Pulmonary/Chest: Effort normal and breath sounds normal. No stridor. No respiratory distress. He has no wheezes. He has no rales. He exhibits no tenderness.  Abdominal: Soft. Bowel sounds are normal. He exhibits no distension and no  mass. There is no tenderness. There is no rebound and no guarding.  Lymphadenopathy:    He has no cervical adenopathy.  Skin: Skin is warm and dry. No rash noted. He is not diaphoretic. No erythema. No pallor.    Assessment & Plan:   Please see problem based assessment and plan.

## 2015-11-20 NOTE — Patient Instructions (Signed)
Take tylenol for fevers.   Take nasacort as directed for your sinus pressure.   Follow up with Dr. Mikey BussingHoffman for your diabetes.

## 2015-11-20 NOTE — Assessment & Plan Note (Addendum)
Lab Results  Component Value Date   HGBA1C 8.1 11/20/2015   HGBA1C 7.7 07/27/2015   HGBA1C 7.9 03/23/2015     Assessment: Diabetes control:  not well controlled Progress toward A1C goal:   deteriorated.  Comments: on Vgo 30  Plan: Medications:  continue current medications Home glucose monitoring: Frequency:  TID  Timing:  before meals Instruction/counseling given: reminded to bring blood glucose meter & log to each visit Educational resources provided:   Self management tools provided:   Other plans: f/u w/ PCP in 1 week

## 2015-11-20 NOTE — Assessment & Plan Note (Addendum)
As per HPI patient has dry cough, sore throat, sinus pressure, malaise, pressure behind eyes, diarrhea, and decreased appetite all consistent with a systemic viral illness that all began 3 days ago. He has possible sick contacts at work. Denies n/v, and he is afebrile here in clinic. Does not meet any centor criteria. He received the flu shot last year. On exam he is tender to palpation of all his sinuses and throat.   - likely pt has a viral illness. Will prescribe triamcinolone nasal spray. Advised pt to take tylenol if he has fevers. F/u in 1 week w/ his PCP

## 2015-11-21 NOTE — Progress Notes (Signed)
Internal Medicine Clinic Attending  Case discussed with Dr. Truong at the time of the visit.  We reviewed the resident's history and exam and pertinent patient test results.  I agree with the assessment, diagnosis, and plan of care documented in the resident's note.  

## 2015-11-22 LAB — GLUCOSE, CAPILLARY: Glucose-Capillary: 265 mg/dL — ABNORMAL HIGH (ref 65–99)

## 2015-11-23 ENCOUNTER — Encounter: Payer: Self-pay | Admitting: Internal Medicine

## 2015-11-23 ENCOUNTER — Ambulatory Visit (INDEPENDENT_AMBULATORY_CARE_PROVIDER_SITE_OTHER): Payer: 59 | Admitting: Internal Medicine

## 2015-11-23 VITALS — BP 125/69 | HR 82 | Temp 97.7°F | Wt 218.2 lb

## 2015-11-23 DIAGNOSIS — J069 Acute upper respiratory infection, unspecified: Secondary | ICD-10-CM | POA: Diagnosis not present

## 2015-11-23 LAB — GLUCOSE, CAPILLARY: Glucose-Capillary: 138 mg/dL — ABNORMAL HIGH (ref 65–99)

## 2015-11-23 NOTE — Progress Notes (Signed)
Patient ID: Tyler Wilkinson, male   DOB: 05/02/1956, 60 y.o.   MRN: [id number]     Subjective:   Patient ID: Tyler Wilkinson male    DOB: 1955-11-25 60 y.o.    MRN: [id number] Health Maintenance Due: Health Maintenance Due  Topic Date Due  . HIV Screening  11/01/1970  . LIPID PANEL  08/06/2015  . ZOSTAVAX  11/01/2015    _________________________________________________  HPI: Tyler Wilkinson is a 60 y.o. male here for an acute visit for URI symptoms.  He was recently seen on 4/3 for the same symptoms.  Pt has a PMH outlined below.  Please see problem-based charting assessment and plan for further status of patient's chronic medical problems addressed at today's visit.  PMH: Past Medical History  Diagnosis Date  . Hypertension   . Erectile dysfunction   . Dyslipidemia   . CAD (coronary artery disease)     stent RCA 2006 (other residual disease). /  nuclear 2008  no ischemia, inferolateral scar.  . Ejection fraction     EF 55%,echo, 2008, inferolateral hypokinesis,  . Myocardial infarction ([place]) 2006  . Chronic bronchitis ([place])     "anytime I get sick it goes into bronchitis; probably get it q yr"  . Sleep apnea     "not wearing mask" (05/10/2014)  . 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 Daily headache     "just recently" (05/10/2014)  . Gastroparesis   . Asthma   . Cataract     Medications: Current Outpatient Prescriptions on File Prior to Visit  Medication Sig Dispense Refill  . albuterol (PROVENTIL HFA;VENTOLIN HFA) 108 (90 BASE) MCG/ACT inhaler Inhale 2 puffs into the lungs every 6 (six) hours as needed for wheezing or shortness of breath. 1 Inhaler 1  . ALPRAZolam (XANAX) 1 MG tablet Take 0.5 tablets (0.5 mg total) by mouth 3 (three) times daily as needed for sleep or anxiety. 30 tablet 2  . Arginine 500 MG CAPS Take 500 mg by mouth daily.    Marland Kitchen aspirin EC 81 MG tablet Take 1 tablet (81 mg total) by mouth daily.    . benazepril (LOTENSIN) 40 MG  tablet Take 1 tablet (40 mg total) by mouth daily. 90 tablet 3  . Blood Glucose Monitoring Suppl (ONE TOUCH ULTRA 2) W/DEVICE KIT Use to check blood sugars up to 6 times daily 1 each 0  . carvedilol (COREG) 6.25 MG tablet Take 1 tablet (6.25 mg total) by mouth 2 (two) times daily. 180 tablet 3  . citalopram (CELEXA) 20 MG tablet Take 1 tablet (20 mg total) by mouth daily. 30 tablet 2  . Flaxseed, Linseed, (FLAX SEED OIL PO) Take by mouth daily.      Marland Kitchen glucose blood (ONE TOUCH ULTRA TEST) test strip 1 each by Other route 4 (four) times daily -  before meals and at bedtime. Use as instructed 200 each 11  . hydrochlorothiazide (HYDRODIURIL) 25 MG tablet Take 1 tablet (25 mg total) by mouth daily. 90 tablet 3  . Insulin Disposable Pump (V-GO 30) KIT 1 Units by Does not apply route daily. 30 kit 11  . insulin lispro (HUMALOG) 100 UNIT/ML injection Use with V-GO 30, use 30 units basal, 2 units with small meals and 4-6 units with larger meals 30 mL 5  . Insulin Syringe-Needle U-100 (INSULIN SYRINGE .5CC/31GX5/16") 31G X 5/16" 0.5 ML MISC Use to inject insulin 3 times a day Dx code 250.00 100 each 12  .  latanoprost (XALATAN) 0.005 % ophthalmic solution Place 1 drop into both eyes at bedtime.    Marland Kitchen levocetirizine (XYZAL) 5 MG tablet Take 1 tablet (5 mg total) by mouth every evening. 30 tablet 11  . nitroGLYCERIN (NITROSTAT) 0.4 MG SL tablet Place 1 tablet (0.4 mg total) under the tongue every 5 (five) minutes as needed for chest pain (DO NOT USE IF YOU HAVE TAKEN VIAGRA). 25 tablet 2  . ONETOUCH DELICA LANCETS 54D MISC Use to check blood sugars up to 4 times a day. Dx code:E11.65 200 each 11  . rosuvastatin (CRESTOR) 10 MG tablet Take 1 tablet (10 mg total) by mouth daily. 30 tablet 11  . sildenafil (REVATIO) 20 MG tablet Take 1 tablet (20 mg total) by mouth as needed. Take 2-4 pills as needed for sexual activity 50 tablet 5  . triamcinolone (NASACORT AQ) 55 MCG/ACT AERO nasal inhaler Place 2 sprays into the  nose daily. 1 Inhaler 0  . VITAMIN D, CHOLECALCIFEROL, PO Take 1 capsule by mouth daily.     No current facility-administered medications on file prior to visit.    Allergies: No Known Allergies  FH: Family History  Problem Relation Age of Onset  . Diabetes Mother   . Alcoholism Father     SH: Social History   Social History  . Marital Status: Single    Spouse Name: N/A  . Number of Children: N/A  . Years of Education: 9   Occupational History  . 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 Smoking status: Former Smoker -- .2 years    Types: Cigarettes    Quit date: 01/26/1973  . Smokeless tobacco: Never Used  . Alcohol Use: 0.0 oz/week    0 Standard drinks or equivalent per week     Comment: Beer rarely.  . Drug Use: Yes    Special: Marijuana     Comment: Occasionally.  . Sexual Activity: Not Asked   Other Topics Concern  . None   Social History Narrative    Review of Systems: Constitutional: Negative for fever, chills . Respiratory: +cough and neg shortness of breath.  Cardiovascular: Negative for chest pain. Gastrointestinal: Negative for nausea, vomiting. Musculoskeletal: Negative for myalgias. Neurological: Negative headaches.     Objective:   Vital Signs: Filed Vitals:   11/23/15 1130  BP: 125/69  Pulse: 82  Temp: 97.7 F (36.5 C)  TempSrc: Oral  Weight: 218 lb 3.2 oz (98.975 kg)  SpO2: 97%      BP Readings from Last 3 Encounters:  11/23/15 125/69  11/20/15 110/66  07/27/15 132/67    Physical Exam: Constitutional: Vital signs reviewed.  Patient is in NAD and cooperative with exam.  He appears older than documented age.   Head: Normocephalic and atraumatic. No sinus tenderness.  Eyes: EOMI, conjunctivae nl, no scleral icterus.  Neck: Supple, no lymphadenopathy. Throat: No exudates or erythema.  Cardiovascular: RRR.   Pulmonary/Chest: Normal effort, CTAB, without wheezes, rales, or  rhonchi. Abdominal: Soft. NT/ND +BS. Neurological: A&O x3, cranial nerves II-XII are grossly intact, moving all extremities. Skin: Warm, dry and intact.    Assessment & Plan:   Assessment and plan was discussed and formulated with my attending.

## 2015-11-23 NOTE — Patient Instructions (Signed)
Thank you for your visit today.   Please return to the internal medicine clinic if your symptoms are not improved in 10 days or worsen. Please use nasal saline spray several times daily.  It may also help to use a neti-pot before you go to bed. You may use some over the counter medications for cough.    Please be sure to bring all of your medications with you to every visit; this includes herbal supplements, vitamins, eye drops, and any over-the-counter medications.   Should you have any questions regarding your medications and/or any new or worsening symptoms, please be sure to call the clinic at [phone].   If you believe that you are suffering from a life threatening condition or one that may result in the loss of limb or function, then you should call 911 and proceed to the nearest Emergency Department.   A healthy lifestyle and preventative care can promote health and wellness.   Maintain regular health, dental, and eye exams.  Eat a healthy diet. Foods like vegetables, fruits, whole grains, low-fat dairy products, and lean protein foods contain the nutrients you need without too many calories. Decrease your intake of foods high in solid fats, added sugars, and salt. Get information about a proper diet from your caregiver, if necessary.  Regular physical exercise is one of the most important things you can do for your health. Most adults should get at least 150 minutes of moderate-intensity exercise (any activity that increases your heart rate and causes you to sweat) each week. In addition, most adults need muscle-strengthening exercises on 2 or more days a week.   Maintain a healthy weight. The body mass index (BMI) is a screening tool to identify possible weight problems. It provides an estimate of body fat based on height and weight. Your caregiver can help determine your BMI, and can help you achieve or maintain a healthy weight. For adults 20 years and older:  A BMI below  18.5 is considered underweight.  A BMI of 18.5 to 24.9 is normal.  A BMI of 25 to 29.9 is considered overweight.  A BMI of 30 and above is considered obese.

## 2015-11-23 NOTE — Assessment & Plan Note (Addendum)
Pt p/w symptoms of a URI that has been going on for several days.  He has been using steroid nasal spray but has not been using a neti-pot or saline nasal spray.  He thinks he needs an antibiotic.  VSS.   -advised no abx now but to give us a call is symptoms are not improved in 10 days -advised to use nasal saline spray several times daily  -cont steroid spray -cont conservative measures for now

## 2015-11-27 NOTE — Progress Notes (Signed)
Case discussed with Dr. Gill soon after the resident saw the patient.  We reviewed the resident's history and exam and pertinent patient test results.  I agree with the assessment, diagnosis, and plan of care documented in the resident's note. 

## 2015-12-07 ENCOUNTER — Telehealth: Payer: Self-pay | Admitting: Internal Medicine

## 2015-12-07 ENCOUNTER — Telehealth: Payer: Self-pay | Admitting: Dietician

## 2015-12-07 NOTE — Telephone Encounter (Signed)
Wanted to talk about Cramps in his legs, wonders if circulation in legs if bad.  Says his neuropathy is causing significant pain in his feet and they are stiff and he thinks it is affecting his walking ability. Told him I would relate this information to his doctors

## 2015-12-07 NOTE — Telephone Encounter (Signed)
APPT. REMINDER CALL, LMTCB °

## 2015-12-07 NOTE — Telephone Encounter (Signed)
I would have a large differential with leg cramps in Tyler Wilkinson and recommend he be given an appointment to be seen.  Given [name] he may have PAD and need ABIs, would also probably check blood work.  Could not reliably differentiate over the phone.  Could we please schedule him for an appointment, ideally in the next 2 weeks or so, also if pain is severe before his visit would recommend visit to ED

## 2015-12-08 ENCOUNTER — Ambulatory Visit: Payer: 59 | Admitting: Internal Medicine

## 2015-12-08 ENCOUNTER — Encounter: Payer: Self-pay | Admitting: Internal Medicine

## 2015-12-08 NOTE — Telephone Encounter (Signed)
He has an appointment with Dr. Danella Pentonruong at 3:45 PM today. He also has an appointment with you on 01/11/2016

## 2015-12-27 ENCOUNTER — Telehealth: Payer: Self-pay | Admitting: Dietician

## 2015-12-27 ENCOUNTER — Encounter: Payer: Self-pay | Admitting: Internal Medicine

## 2015-12-27 ENCOUNTER — Ambulatory Visit (INDEPENDENT_AMBULATORY_CARE_PROVIDER_SITE_OTHER): Payer: 59 | Admitting: Internal Medicine

## 2015-12-27 VITALS — BP 129/64 | HR 80 | Temp 97.9°F | Ht 72.5 in | Wt 217.3 lb

## 2015-12-27 DIAGNOSIS — J029 Acute pharyngitis, unspecified: Secondary | ICD-10-CM | POA: Diagnosis not present

## 2015-12-27 DIAGNOSIS — H6121 Impacted cerumen, right ear: Secondary | ICD-10-CM

## 2015-12-27 LAB — GLUCOSE, CAPILLARY: GLUCOSE-CAPILLARY: 143 mg/dL — AB (ref 65–99)

## 2015-12-27 MED ORDER — TRIAMCINOLONE ACETONIDE 55 MCG/ACT NA AERO: 1.0000 | INHALATION_SPRAY | Freq: Two times a day (BID) | NASAL | Status: AC

## 2015-12-27 MED ORDER — GUAIFENESIN DM 400-20 MG PO TABS: 1.0000 | ORAL_TABLET | ORAL | Status: DC | PRN

## 2015-12-27 NOTE — Assessment & Plan Note (Addendum)
Overview 4 days ago, he notes the acute onset of sore throat while he was watching his grandkids baseball. He also felt tired and had low energy as he could not stay awake to watch the entire game. His symptoms have been persistent and involved to a cough productive of "yellowish tinted" sputum this morning. He is concerned that his symptoms may have invaded into his bronchial airspaces due to his comorbid diabetes. Though he denies associated fever, chills he does acknowledge nasal itching, rhinorrhea, otorrhea, diminished hearing loss bilaterally, congestion, and possibly sick contacts through his grandkids and coworkers. He has tried Nasacort 1 spray per nostril daily, Sudafed 1 tablet every 12 hours 5, Afrin 1 spray 2 days with his last spray scheduled for today before he is to discontinue this medication to avoid rebound congestion. He has continued his antihistamine levocetirizine 5 mg every evening.   Assessment The recurrence of his symptoms given his comorbid allergic rhinitis and antecedent viral pharyngitis raises the possibility of eustachian tube dysfunction. Ear exam is unremarkable for acute otitis media, and I suspect some of his other symptoms are consistent with allergic rhinitis. In the absence of systemic symptoms, I'm also less likely to think antimicrobial therapy is warranted. Lung exam is also reassuring for no lower respiratory tract infection.  Plan -Counseled the patient that his symptoms should improve in the next 1-2 weeks, and should they not, he is scheduled to follow-up with his PCP at which time he may be referred for ENT for further investigation -Reviewed proper technique of triamcinolone nasal spray as he reported having a sour taste in his mouth following administration of this medication. I refilled the nasal spray and increased frequency of dosing to twice daily. -Encouraged patient to ask the pharmacist about the Netti pot to alleviate his symptoms of  congestion -Prescribed guaifenesin/dextromethorphan 400/20 mg tablets every 4 hours as needed for cough

## 2015-12-27 NOTE — Progress Notes (Signed)
   Subjective:    Patient ID: Tyler Wilkinson, male    DOB: Jan 01, 1956, 60 y.o.   MRN: [id number]  HPI [name] is a 60 year old male with hypertension, insulin-dependent type 2 diabetes complicated by neuropathy and gastroparesis, history of coronary artery disease status post PCI who presents today for sore throat. Please see assessment & plan for status of chronic medical problems.    Review of Systems  HENT: Positive for congestion, ear pain, hearing loss, rhinorrhea, sinus pressure and sore throat.   Respiratory: Positive for cough.   Neurological: Positive for headaches.       Objective:   Physical Exam  Constitutional: He is oriented to person, place, and time. He appears well-developed and well-nourished. No distress.  Tired appearing  HENT:  Head: Normocephalic and atraumatic.  Mouth/Throat: Oropharynx is clear and moist.  Right tympanic membrane could not be visualized due to large cerumen burden though light reflex intact following disimpaction. Left tympanic membrane with intact light reflex. No tenderness noted on otoscopic exam. Tenderness noted to palpation of the maxillary and frontal sinuses. Posterior oropharynx without erythema  Eyes: Conjunctivae are normal. No scleral icterus.  Cardiovascular: Normal rate and regular rhythm.  Exam reveals no gallop and no friction rub.   No murmur heard. Pulmonary/Chest: Effort normal and breath sounds normal. No respiratory distress. He has no wheezes. He has no rales.  Lymphadenopathy:    He has no cervical adenopathy.  Neurological: He is alert and oriented to person, place, and time.  Skin: Skin is warm and dry. He is not diaphoretic.  Psychiatric:  Anxious appearing      Assessment & Plan:

## 2015-12-27 NOTE — Patient Instructions (Signed)
Please come back in 2 weeks to see how you're doing.  Try the [facility] Eye InstituteNeti Pot in the mean time to see if helps with your congestion.   Continue with the Nasocort and allergy medications.  Barotitis Media Barotitis media is inflammation of your middle ear. This occurs when the auditory tube (eustachian tube) leading from the back of your nose (nasopharynx) to your eardrum is blocked. This blockage may result from a cold, environmental allergies, or an upper respiratory infection.

## 2015-12-27 NOTE — Telephone Encounter (Signed)
He is sick adn sent home from work. He asks if we have appointments open this afternoon. His call was transferred to front office.

## 2015-12-27 NOTE — Assessment & Plan Note (Signed)
Overview Right ear canal with large cerumen burden is noted on physical exam. It appears he has had this in the past, and I believe it may improve his symptoms.  Assessment Cerumen impaction of the right ear canal  Plan Asked RN to clean ear after which tympanic membrane was successfully visualized

## 2015-12-28 NOTE — Progress Notes (Signed)
Case discussed with Dr. Patel at time of visit. We reviewed the resident's history and exam and pertinent patient test results. I agree with the assessment, diagnosis, and plan of care documented in the resident's note. 

## 2016-01-11 ENCOUNTER — Ambulatory Visit (INDEPENDENT_AMBULATORY_CARE_PROVIDER_SITE_OTHER): Payer: 59 | Admitting: Internal Medicine

## 2016-01-11 ENCOUNTER — Telehealth: Payer: Self-pay | Admitting: Dietician

## 2016-01-11 ENCOUNTER — Encounter: Payer: Self-pay | Admitting: Internal Medicine

## 2016-01-11 VITALS — BP 106/62 | HR 77 | Temp 98.2°F | Ht 73.0 in | Wt 217.6 lb

## 2016-01-11 DIAGNOSIS — R05 Cough: Secondary | ICD-10-CM

## 2016-01-11 DIAGNOSIS — Z794 Long term (current) use of insulin: Secondary | ICD-10-CM

## 2016-01-11 DIAGNOSIS — E1143 Type 2 diabetes mellitus with diabetic autonomic (poly)neuropathy: Secondary | ICD-10-CM | POA: Diagnosis not present

## 2016-01-11 DIAGNOSIS — B354 Tinea corporis: Secondary | ICD-10-CM | POA: Insufficient documentation

## 2016-01-11 DIAGNOSIS — I10 Essential (primary) hypertension: Secondary | ICD-10-CM | POA: Diagnosis not present

## 2016-01-11 DIAGNOSIS — Z87891 Personal history of nicotine dependence: Secondary | ICD-10-CM

## 2016-01-11 DIAGNOSIS — F410 Panic disorder [episodic paroxysmal anxiety] without agoraphobia: Secondary | ICD-10-CM

## 2016-01-11 DIAGNOSIS — R053 Chronic cough: Secondary | ICD-10-CM | POA: Insufficient documentation

## 2016-01-11 LAB — GLUCOSE, CAPILLARY: Glucose-Capillary: 110 mg/dL — ABNORMAL HIGH (ref 65–99)

## 2016-01-11 MED ORDER — ALPRAZOLAM 0.5 MG PO TABS: 0.5000 mg | ORAL_TABLET | Freq: Every evening | ORAL | Status: DC | PRN

## 2016-01-11 MED ORDER — CLOTRIMAZOLE 1 % EX CREA: 1.0000 "application " | TOPICAL_CREAM | Freq: Two times a day (BID) | CUTANEOUS | Status: DC

## 2016-01-11 MED ORDER — BENZONATATE 200 MG PO CAPS: 200.0000 mg | ORAL_CAPSULE | Freq: Three times a day (TID) | ORAL | Status: DC | PRN

## 2016-01-11 MED ORDER — SILDENAFIL CITRATE 20 MG PO TABS: 20.0000 mg | ORAL_TABLET | ORAL | Status: DC | PRN

## 2016-01-11 NOTE — Patient Instructions (Signed)
I want you to go for a chest xray to evaluate for your cough.  I have referred you over to the foot doctor.

## 2016-01-11 NOTE — Telephone Encounter (Signed)
Called patient as part of project trying to help patient's with a1C between 8-9% lower their blood sugars to target Reminded patient to bring his blood sugar meter to his visit today.   Called pharmacies to request refill history: [facility] [place]- she said he gets both his Vgo- monthly and his Humalog- 3 vials regularly (most recent fill dates are  12-26-15, 11/22/15)

## 2016-01-16 NOTE — Assessment & Plan Note (Addendum)
S: Using V-GO 30.  No issues with device.  Forgot meter.  Wants to see podiatrist due to pain with standing long periods of time at work.  A: Type 2 DM with peripheral neuropathy, fair control  P:Continue Vgo-30 Referral to podiatry

## 2016-01-16 NOTE — Assessment & Plan Note (Signed)
S: No issues with medications per patient  A: Essential HTN, at goal  P: I am concerned that his benazepril could be causing his chronic cough, patient is currently hesitant to change to ARB.  Will continue to monitor this and if not resolved by next visit will go ahead with change to ARB

## 2016-01-16 NOTE — Assessment & Plan Note (Signed)
S: He has done very well with low dose xanax in the past.  He would like a refill.  A: Anxiety disorder with panic attacks  P: Based on his previous use will decrease to Xanax 0.5mg  tablets with #30 per month.

## 2016-01-16 NOTE — Assessment & Plan Note (Signed)
S: Skin rash on chest  A: Tinea corporis  P Clotrimazole cream BID

## 2016-01-16 NOTE — Progress Notes (Signed)
[facility] Subjective:   Patient ID: Tyler Wilkinson male   DOB: June 04, 1956 60 y.o.   MRN: [id number]  HPI: Tyler Wilkinson is a 60 y.o. male with a PMH detailed below who presents for follow up of HTN and cough.  Please see problem based charting below for the status of his chronic medical problems.    Past Medical History  Diagnosis Date  . Hypertension   . Erectile dysfunction   . Dyslipidemia   . CAD (coronary artery disease)     stent RCA 2006 (other residual disease). /  nuclear 2008  no ischemia, inferolateral scar.  . Ejection fraction     EF 55%,echo, 2008, inferolateral hypokinesis,  . Myocardial infarction ([place]) 2006  . Chronic bronchitis ([place])     "anytime I get sick it goes into bronchitis; probably get it q yr"  . Sleep apnea     "not wearing mask" (05/10/2014)  . Type 2 diabetes mellitus with complications ([place]) dx'd 1972  . Daily headache     "just recently" (05/10/2014)  . Gastroparesis   . Asthma   . Cataract    Current Outpatient Prescriptions  Medication Sig Dispense Refill  . albuterol (PROVENTIL HFA;VENTOLIN HFA) 108 (90 BASE) MCG/ACT inhaler Inhale 2 puffs into the lungs every 6 (six) hours as needed for wheezing or shortness of breath. 1 Inhaler 1  . ALPRAZolam (XANAX) 0.5 MG tablet Take 1 tablet (0.5 mg total) by mouth at bedtime as needed for anxiety or sleep. 30 tablet 2  . Arginine 500 MG CAPS Take 500 mg by mouth daily.    Marland Kitchen aspirin EC 81 MG tablet Take 1 tablet (81 mg total) by mouth daily.    . benazepril (LOTENSIN) 40 MG tablet Take 1 tablet (40 mg total) by mouth daily. 90 tablet 3  . benzonatate (TESSALON) 200 MG capsule Take 1 capsule (200 mg total) by mouth 3 (three) times daily as needed for cough. 30 capsule 1  . Blood Glucose Monitoring Suppl (ONE TOUCH ULTRA 2) W/DEVICE KIT Use to check blood sugars up to 6 times daily 1 each 0  . carvedilol (COREG) 6.25 MG tablet Take 1 tablet (6.25 mg total) by mouth 2 (two)  times daily. 180 tablet 3  . citalopram (CELEXA) 20 MG tablet Take 1 tablet (20 mg total) by mouth daily. 30 tablet 2  . clotrimazole (LOTRIMIN) 1 % cream Apply 1 application topically 2 (two) times daily. 30 g 0  . Dextromethorphan-Guaifenesin (GUAIFENESIN DM) 400-20 MG TABS Take 1 tablet by mouth every 4 (four) hours as needed. 64 tablet 0  . Flaxseed, Linseed, (FLAX SEED OIL PO) Take by mouth daily.      Marland Kitchen glucose blood (ONE TOUCH ULTRA TEST) test strip 1 each by Other route 4 (four) times daily -  before meals and at bedtime. Use as instructed 200 each 11  . hydrochlorothiazide (HYDRODIURIL) 25 MG tablet Take 1 tablet (25 mg total) by mouth daily. 90 tablet 3  . Insulin Disposable Pump (V-GO 30) KIT 1 Units by Does not apply route daily. 30 kit 11  . insulin lispro (HUMALOG) 100 UNIT/ML injection Use with V-GO 30, use 30 units basal, 2 units with small meals and 4-6 units with larger meals 30 mL 5  . Insulin Syringe-Needle U-100 (INSULIN SYRINGE .5CC/31GX5/16") 31G X 5/16" 0.5 ML MISC Use to inject insulin 3 times a day Dx code 250.00 100 each 12  . latanoprost (XALATAN) 0.005 % ophthalmic  solution Place 1 drop into both eyes at bedtime.    Marland Kitchen levocetirizine (XYZAL) 5 MG tablet Take 1 tablet (5 mg total) by mouth every evening. 30 tablet 11  . nitroGLYCERIN (NITROSTAT) 0.4 MG SL tablet Place 1 tablet (0.4 mg total) under the tongue every 5 (five) minutes as needed for chest pain (DO NOT USE IF YOU HAVE TAKEN VIAGRA). 25 tablet 2  . ONETOUCH DELICA LANCETS 70W MISC Use to check blood sugars up to 4 times a day. Dx code:E11.65 200 each 11  . rosuvastatin (CRESTOR) 10 MG tablet Take 1 tablet (10 mg total) by mouth daily. 30 tablet 11  . sildenafil (REVATIO) 20 MG tablet Take 1 tablet (20 mg total) by mouth as needed. Take 2-5 tablets as needed for sexual activity 50 tablet 5  . triamcinolone (NASACORT AQ) 55 MCG/ACT AERO nasal inhaler Place 1 spray into the nose 2 (two) times daily. 1 Inhaler 0  .  VITAMIN D, CHOLECALCIFEROL, PO Take 1 capsule by mouth daily.     No current facility-administered medications for this visit.   Family History  Problem Relation Age of Onset  . Diabetes Mother   . Alcoholism Father    Social History   Social History  . Marital Status: Single    Spouse Name: N/A  . Number of Children: N/A  . Years of Education: 9   Occupational History  . 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 Smoking status: Former Smoker -- .2 years    Types: Cigarettes    Quit date: 01/26/1973  . Smokeless tobacco: Never Used  . Alcohol Use: 0.0 oz/week    0 Standard drinks or equivalent per week     Comment: Beer rarely.  . Drug Use: Yes    Special: Marijuana     Comment: Occasionally.  . Sexual Activity: Not Asked   Other Topics Concern  . None   Social History Narrative   Review of Systems: Review of Systems  Constitutional: Negative for fever and chills.  Eyes: Negative for blurred vision.  Respiratory: Negative for cough.   Cardiovascular: Negative for chest pain.  Gastrointestinal: Negative for abdominal pain.  Genitourinary: Negative for dysuria.  Musculoskeletal: Negative for myalgias.  Neurological: Negative for dizziness and headaches.  Psychiatric/Behavioral: Negative for depression and substance abuse.     Objective:  Physical Exam: Filed Vitals:   01/11/16 1606  BP: 106/62  Pulse: 77  Temp: 98.2 F (36.8 C)  TempSrc: Oral  Height: 6' 1"  (1.854 m)  Weight: 217 lb 9.6 oz (98.703 kg)  SpO2: 98%  Physical Exam  Constitutional: He is well-developed, well-nourished, and in no distress.  Cardiovascular: Normal rate, regular rhythm and intact distal pulses.   Pulmonary/Chest: Effort normal and breath sounds normal. He has no wheezes.  Abdominal: Soft. Bowel sounds are normal.  V go present on abdomen  Musculoskeletal: He exhibits no edema.  Skin: Skin is warm and dry.  Circular rash on anterior chest   Nursing note and vitals reviewed.   Assessment & Plan:  Case discussed with Dr. Eppie Gibson  Essential hypertension S: No issues with medications per patient  A: Essential HTN, at goal  P: I am concerned that his benazepril could be causing his chronic cough, patient is currently hesitant to change to ARB.  Will continue to monitor this and if not resolved by next visit will go ahead with change to ARB  Chronic cough S: He has had  a chronic nonproductive cough for >2 months.  He felt previously this may have been a viral bronchitits but seems to not resolve.  He feels that he needs a little more time and tessalon pearles to help it resolve.  He has no hemoptysis.  A: Chronic cough  P: Obtain CXR today I am concerned that his ACEi may be the cause and will consider changing this to an ARB at this next visit He also has a history of asthma and allergic rhinnits which makes these potential causes of his cough.  Panic attacks S: He has done very well with low dose xanax in the past.  He would like a refill.  A: Anxiety disorder with panic attacks  P: Based on his previous use will decrease to Xanax 0.68m tablets with #30 per month.  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  No issues with device.  Forgot meter.  Wants to see podiatrist due to pain with standing long periods of time at work.  A: Type 2 DM with peripheral neuropathy, fair control  P:Continue Vgo-30 Referral to podiatry    Medications Ordered Meds ordered this encounter  Medications  . clotrimazole (LOTRIMIN) 1 % cream    Sig: Apply 1 application topically 2 (two) times daily.    Dispense:  30 g    Refill:  0  . benzonatate (TESSALON) 200 MG capsule    Sig: Take 1 capsule (200 mg total) by mouth 3 (three) times daily as needed for cough.    Dispense:  30 capsule    Refill:  1  .  ALPRAZolam (XANAX) 0.5 MG tablet    Sig: Take 1 tablet (0.5 mg total) by mouth at bedtime as needed for anxiety or sleep.    Dispense:  30 tablet    Refill:  2  . sildenafil (REVATIO) 20 MG tablet    Sig: Take 1 tablet (20 mg total) by mouth as needed. Take 2-5 tablets as needed for sexual activity    Dispense:  50 tablet    Refill:  5   Other Orders Orders Placed This Encounter  Procedures  . DG Chest 2 View    Standing Status: Future     Number of Occurrences:      Standing Expiration Date: 03/12/2017    Order Specific Question:  Reason for Exam (SYMPTOM  OR DIAGNOSIS REQUIRED)    Answer:  chronic cough for over 2 months    Order Specific Question:  Pref[facility]?    [facility] Glucose, capillary  . Ambulatory referral to Podiatry    Referral Priority:  Routine    Referral Type:  Consultation    Referral Reason:  Specialty Services Required    Requested Specialty:  Podiatry    Number of Visits Requested:  1   Follow Up: Return 1-3 months.

## 2016-01-16 NOTE — Assessment & Plan Note (Signed)
S: He has had a chronic nonproductive cough for >2 months.  He felt previously this may have been a viral bronchitits but seems to not resolve.  He feels that he needs a little more time and tessalon pearles to help it resolve.  He has no hemoptysis.  A: Chronic cough  P: Obtain CXR today I am concerned that his ACEi may be the cause and will consider changing this to an ARB at this next visit He also has a history of asthma and allergic rhinnits which makes these potential causes of his cough.

## 2016-01-17 NOTE — Progress Notes (Signed)
Case discussed with Dr. Hoffman at the time of the visit.  We reviewed the resident's history and exam and pertinent patient test results.  I agree with the assessment, diagnosis and plan of care documented in the resident's note. 

## 2016-02-28 NOTE — Addendum Note (Signed)
Addended by: Dorie RankPOWERS, Graeson Nouri E on: 02/28/2016 06:30 PM   Modules accepted: Orders

## 2016-03-04 ENCOUNTER — Telehealth: Payer: Self-pay | Admitting: Dietician

## 2016-03-04 NOTE — Telephone Encounter (Signed)
[name] calls saying he has abdominal pain on right lower abdomen and under his armpits. [facility]-  tdap, pneumonia and hepatitis. I told him because this is non diabetes related, I would refer him to the triage nurse. He says he could make an 11 AM appointment if needed.

## 2016-03-04 NOTE — Telephone Encounter (Signed)
Called pt back, he seems very anxious, states he didn't know he was getting [facility], "they talked me into it some way, i dont think they should have given me 3", just started having abd pain today, started having armpit pain after the shots appt made when he stated he could come in for an appt, ask him to go to ED if pain gets worse or he has chest pain, shortness of pain, n&v, h/a, he was agreeable- appt 7/19 at 0945

## 2016-03-05 NOTE — Telephone Encounter (Signed)
Thank you :)

## 2016-03-06 ENCOUNTER — Ambulatory Visit (INDEPENDENT_AMBULATORY_CARE_PROVIDER_SITE_OTHER): Payer: 59 | Admitting: Internal Medicine

## 2016-03-06 ENCOUNTER — Encounter: Payer: Self-pay | Admitting: Internal Medicine

## 2016-03-06 VITALS — BP 115/74 | HR 73 | Temp 97.8°F | Wt 211.3 lb

## 2016-03-06 DIAGNOSIS — R1032 Left lower quadrant pain: Secondary | ICD-10-CM | POA: Diagnosis not present

## 2016-03-06 NOTE — Progress Notes (Signed)
CC: Left lower quadrant abdominal pain HPI: [name] is a 60 y.o. male with a h/o of hypertension, erectile dysfunction, dyslipidemia, coronary artery disease and sleep apnea who presents with left lower quadrant abdominal pain.The patient has no additional acute complaints or concerns at today's visit.  Please see problem-based charting for status of medical issues pertinent to this visit.     Past Medical History  Diagnosis Date  . Hypertension   . Erectile dysfunction   . Dyslipidemia   . CAD (coronary artery disease)     stent RCA 2006 (other residual disease). /  nuclear 2008  no ischemia, inferolateral scar.  . Ejection fraction     EF 55%,echo, 2008, inferolateral hypokinesis,  . Myocardial infarction ([place]) 2006  . Chronic bronchitis ([place])     "anytime I get sick it goes into bronchitis; probably get it q yr"  . Sleep apnea     "not wearing mask" (05/10/2014)  . Type 2 diabetes mellitus with complications ([place]) dx'd 1972  . Daily headache     "just recently" (05/10/2014)  . Gastroparesis   . Asthma   . Cataract    Current Outpatient Rx  Name  Route  Sig  Dispense  Refill  . albuterol (PROVENTIL HFA;VENTOLIN HFA) 108 (90 BASE) MCG/ACT inhaler   Inhalation   Inhale 2 puffs into the lungs every 6 (six) hours as needed for wheezing or shortness of breath.   1 Inhaler   1   . ALPRAZolam (XANAX) 0.5 MG tablet   Oral   Take 1 tablet (0.5 mg total) by mouth at bedtime as needed for anxiety or sleep.   30 tablet   2   . Arginine 500 MG CAPS   Oral   Take 500 mg by mouth daily.         Marland Kitchen aspirin EC 81 MG tablet   Oral   Take 1 tablet (81 mg total) by mouth daily.         . benazepril (LOTENSIN) 40 MG tablet   Oral   Take 1 tablet (40 mg total) by mouth daily.   90 tablet   3   . benzonatate (TESSALON) 200 MG capsule   Oral   Take 1 capsule (200 mg total) by mouth 3 (three) times daily as needed for cough.   30 capsule   1   . Blood Glucose  Monitoring Suppl (ONE TOUCH ULTRA 2) W/DEVICE KIT      Use to check blood sugars up to 6 times daily   1 each   0     The patient is insulin requiring, ICD 10 code E11. ...   . carvedilol (COREG) 6.25 MG tablet   Oral   Take 1 tablet (6.25 mg total) by mouth 2 (two) times daily.   180 tablet   3     Please D/C Previous Rx for Coreg patient wants 90  ...   . citalopram (CELEXA) 20 MG tablet   Oral   Take 1 tablet (20 mg total) by mouth daily.   30 tablet   2   . clotrimazole (LOTRIMIN) 1 % cream   Topical   Apply 1 application topically 2 (two) times daily.   30 g   0   . Dextromethorphan-Guaifenesin (GUAIFENESIN DM) 400-20 MG TABS   Oral   Take 1 tablet by mouth every 4 (four) hours as needed.   64 tablet   0   . Flaxseed, Linseed, (FLAX  SEED OIL PO)   Oral   Take by mouth daily.           Marland Kitchen glucose blood (ONE TOUCH ULTRA TEST) test strip   Other   1 each by Other route 4 (four) times daily -  before meals and at bedtime. Use as instructed   200 each   11     The patient is insulin requiring, ICD 10 code E11. ...   . hydrochlorothiazide (HYDRODIURIL) 25 MG tablet   Oral   Take 1 tablet (25 mg total) by mouth daily.   90 tablet   3   . Insulin Disposable Pump (V-GO 30) KIT   Does not apply   1 Units by Does not apply route daily.   30 kit   11   . insulin lispro (HUMALOG) 100 UNIT/ML injection      Use with V-GO 30, use 30 units basal, 2 units with small meals and 4-6 units with larger meals   30 mL   5   . Insulin Syringe-Needle U-100 (INSULIN SYRINGE .5CC/31GX5/16") 31G X 5/16" 0.5 ML MISC      Use to inject insulin 3 times a day Dx code 250.00   100 each   12   . latanoprost (XALATAN) 0.005 % ophthalmic solution   Both Eyes   Place 1 drop into both eyes at bedtime.         Marland Kitchen levocetirizine (XYZAL) 5 MG tablet   Oral   Take 1 tablet (5 mg total) by mouth every evening.   30 tablet   11   . nitroGLYCERIN (NITROSTAT) 0.4 MG SL  tablet   Sublingual   Place 1 tablet (0.4 mg total) under the tongue every 5 (five) minutes as needed for chest pain (DO NOT USE IF YOU HAVE TAKEN VIAGRA).   25 tablet   2   . ONETOUCH DELICA LANCETS 50Y MISC      Use to check blood sugars up to 4 times a day. Dx code:E11.65   200 each   11   . rosuvastatin (CRESTOR) 10 MG tablet   Oral   Take 1 tablet (10 mg total) by mouth daily.   30 tablet   11   . sildenafil (REVATIO) 20 MG tablet   Oral   Take 1 tablet (20 mg total) by mouth as needed. Take 2-5 tablets as needed for sexual activity   50 tablet   5   . triamcinolone (NASACORT AQ) 55 MCG/ACT AERO nasal inhaler   Nasal   Place 1 spray into the nose 2 (two) times daily.   1 Inhaler   0   . VITAMIN D, CHOLECALCIFEROL, PO   Oral   Take 1 capsule by mouth daily.            Review of Systems: A complete ROS was negative except as per HPI.  Physical Exam: Filed Vitals:   03/06/16 1102  BP: 115/74  Pulse: 73  Temp: 97.8 F (36.6 C)  TempSrc: Oral  Weight: 211 lb 4.8 oz (95.845 kg)  SpO2: 99%   General appearance: alert and cooperative Head: Normocephalic, without obvious abnormality, atraumatic Lungs: clear to auscultation bilaterally Heart: regular rate and rhythm, S1, S2 normal, no murmur, click, rub or gallop Abdomen: Soft, nondistended, mild pain to palpation in the left lower quadrant which was worse with direct palpation, no rebound tenderness or guarding, no additional pain elicited on examination in other abdominal quadrants, bowel sounds present, no bruits  auscultated Extremities: extremities normal, atraumatic, no cyanosis or edema  Assessment & Plan:  See encounters tab for problem based medical decision making. Patient seen with Dr. Lynnae January  Signed: Ophelia Shoulder, MD 03/06/2016, 1:07 PM  Pager: [phone]

## 2016-03-06 NOTE — Patient Instructions (Signed)
It was a pleasure seeing you today. [facility].  -- If pain returns or worsens please return to clinic  -- Stay well hydrated -- Eat bland foods

## 2016-03-06 NOTE — Telephone Encounter (Signed)
Called patient's pharmacy as part of project trying to help patient's with a1C between 8-9% lower their blood sugars to target [facility] [place], will put in your box.  [facility]- has not gotten medicine here since  04/2015 [facility] [address] [place]- last fill was November 2016 [facility] [address]- he does not use this pharmacy- consider removing  it from his profile.

## 2016-03-06 NOTE — Assessment & Plan Note (Signed)
Patient presents with a four-day history of left lower quadrant abdominal pain.The patient states that the pain is a very dull ache. This pain is not sharp in nature and does not radiate to other abdominal quadrants or to the patient's back. In addition to this pain the patient complains of a 2 day history of diarrhea. The patient denies any constipation or blood in the stool. He denies any episodes of vomiting although he says he has felt nauseous the last 2 days.He also denies fevers, chills, night sweats, shortness of breath, and chest pain. At this stage the patient says that his pain is almost entirely resolved.Given that the patient's pain is resolved the etiology of this left lower quadrant abdominal pain is broad and the differential diagnosis includes acute viral gastroenteritis, diverticulitis, muscle strain or constipation which is resolving with diarrhea. Given that the patient has no red flag symptoms or other acute complaints today we do not feel further workup is necessary at this time. -- the patient was instructed to return to clinic if symptoms do not improve or acutely worsen -- instructed the patient to drink plenty of fluids and consume a bland diet until the pain entirely resolves -- he will need to return to clinic in September for an appointment with his primary care physician-Dr. Mikey BussingHoffman

## 2016-03-08 NOTE — Progress Notes (Addendum)
Internal Medicine Clinic Attending  I saw and evaluated the patient.  I personally confirmed the key portions of the history and exam documented by Dr. Taylor and I reviewed pertinent patient test results.  The assessment, diagnosis, and plan were formulated together and I agree with the documentation in the resident's note.  

## 2016-03-29 ENCOUNTER — Other Ambulatory Visit: Payer: Self-pay | Admitting: *Deleted

## 2016-03-29 NOTE — Addendum Note (Signed)
Addended by: Bufford SpikesFULCHER, Jazzlynn Rawe N on: 03/29/2016 10:50 AM   Modules accepted: Orders

## 2016-04-01 MED ORDER — INSULIN LISPRO 100 UNIT/ML ~~LOC~~ SOLN: SUBCUTANEOUS | 5 refills | Status: DC

## 2016-04-22 DIAGNOSIS — Z23 Encounter for immunization: Secondary | ICD-10-CM | POA: Diagnosis not present

## 2016-05-03 ENCOUNTER — Other Ambulatory Visit: Payer: Self-pay | Admitting: Dietician

## 2016-05-03 DIAGNOSIS — E1143 Type 2 diabetes mellitus with diabetic autonomic (poly)neuropathy: Secondary | ICD-10-CM

## 2016-05-06 NOTE — Telephone Encounter (Signed)
Patient left voicemail requesting return call last Friday. Tried calling him back both Friday ans Monday. His voicemail is not set up so cannot leave a message.

## 2016-05-07 ENCOUNTER — Other Ambulatory Visit: Payer: Self-pay | Admitting: Dietician

## 2016-05-07 DIAGNOSIS — E1143 Type 2 diabetes mellitus with diabetic autonomic (poly)neuropathy: Secondary | ICD-10-CM

## 2016-05-07 NOTE — Telephone Encounter (Signed)
[facility] and their preferred meter is Bayer contour and preferred rapid acting insulin is Novolog. [name] request new prescriptions.

## 2016-05-07 NOTE — Addendum Note (Signed)
Addended by: Baird CancerPLYLER, DONNA M on: 05/07/2016 08:57 AM   Modules accepted: Orders

## 2016-05-07 NOTE — Telephone Encounter (Signed)
[name] says his insurance [facility] [place] so he now needs the bayer contour meter and supplies and novolog insulin as these are preferred.

## 2016-05-08 MED ORDER — GLUCOSE BLOOD VI STRP: ORAL_STRIP | 5 refills | Status: DC

## 2016-05-08 MED ORDER — INSULIN ASPART 100 UNIT/ML ~~LOC~~ SOLN: SUBCUTANEOUS | 3 refills | Status: DC

## 2016-05-08 MED ORDER — BAYER CONTOUR NEXT USB MONITOR W/DEVICE KIT: PACK | 1 refills | Status: AC

## 2016-05-10 ENCOUNTER — Other Ambulatory Visit: Payer: Self-pay | Admitting: *Deleted

## 2016-05-10 DIAGNOSIS — F410 Panic disorder [episodic paroxysmal anxiety] without agoraphobia: Secondary | ICD-10-CM

## 2016-05-10 MED ORDER — ALPRAZOLAM 0.5 MG PO TABS: 0.5000 mg | ORAL_TABLET | Freq: Every evening | ORAL | 2 refills | Status: DC | PRN

## 2016-05-10 NOTE — Telephone Encounter (Signed)
Please phone in perscription

## 2016-05-10 NOTE — Telephone Encounter (Signed)
Received faxed refill request from pt's pharmacy.  Of note, pt's address on faxed request is [address] [place] [id number].  Attempted to contact pt and confirm address, no answer-unable to leave message.Criss Alvine.[place]ston, Darlene Cassady9/22/20173:12 PM

## 2016-05-21 ENCOUNTER — Other Ambulatory Visit: Payer: Self-pay | Admitting: Dietician

## 2016-05-21 NOTE — Telephone Encounter (Signed)
Called patient about new prescriptions. He has not picked them up. [facility] [address] - he got the following vaccines shingles, both pneumonia, 2 hepatitis B shots Has eye appointment this Friday at Dr. Laruth BouchardGroat's office.   Encouraged him to schedule an appointment with Dr. Mikey BussingHoffman for A1C discuss foot problems.

## 2016-05-24 DIAGNOSIS — H25813 Combined forms of age-related cataract, bilateral: Secondary | ICD-10-CM | POA: Diagnosis not present

## 2016-05-24 DIAGNOSIS — E119 Type 2 diabetes mellitus without complications: Secondary | ICD-10-CM | POA: Diagnosis not present

## 2016-05-24 DIAGNOSIS — H40053 Ocular hypertension, bilateral: Secondary | ICD-10-CM | POA: Diagnosis not present

## 2016-05-28 ENCOUNTER — Telehealth: Payer: Self-pay | Admitting: Dietician

## 2016-05-28 NOTE — Telephone Encounter (Signed)
Patient called for help with his blood sugars. He says they have been higher for the past week or two going up to 400-500s at times. . He does not think he has changed his diet or activity. If anything he feels he has cut down on sweets and sweet drinks. He does not think he has an infection or bad insulin because some of the time his blood sugars are ~ in target, at bedtime he says his blood sugar is "normal". It was 80 fasting today. He started taking a b complex vitamin last week and also Wonders if he is having low blood sugars. Advised him to keep his blood guars above 90-100 and see if that helps.  Encouraged him to schedule an appointment with his PCP

## 2016-06-12 ENCOUNTER — Telehealth: Payer: Self-pay | Admitting: Dietician

## 2016-06-12 NOTE — Telephone Encounter (Signed)
Asked for coupons for his test strips and insulin. Mailed them both to him.

## 2016-06-14 ENCOUNTER — Other Ambulatory Visit: Payer: Self-pay

## 2016-06-14 DIAGNOSIS — I10 Essential (primary) hypertension: Secondary | ICD-10-CM

## 2016-06-14 MED ORDER — BENAZEPRIL HCL 40 MG PO TABS: 40.0000 mg | ORAL_TABLET | Freq: Every day | ORAL | 0 refills | Status: DC

## 2016-06-14 NOTE — Telephone Encounter (Signed)
benazepril (LOTENSIN) 40 MG tablet, [facility] [address].

## 2016-06-20 ENCOUNTER — Other Ambulatory Visit: Payer: Self-pay | Admitting: *Deleted

## 2016-06-20 ENCOUNTER — Encounter: Payer: Self-pay | Admitting: Interventional Cardiology

## 2016-06-20 MED ORDER — CARVEDILOL 6.25 MG PO TABS: 6.2500 mg | ORAL_TABLET | Freq: Two times a day (BID) | ORAL | 1 refills | Status: DC

## 2016-07-05 ENCOUNTER — Ambulatory Visit (INDEPENDENT_AMBULATORY_CARE_PROVIDER_SITE_OTHER): Payer: BLUE CROSS/BLUE SHIELD | Admitting: Interventional Cardiology

## 2016-07-05 ENCOUNTER — Encounter: Payer: Self-pay | Admitting: Interventional Cardiology

## 2016-07-05 VITALS — BP 116/64 | HR 85 | Ht 77.0 in | Wt 210.4 lb

## 2016-07-05 DIAGNOSIS — I2510 Atherosclerotic heart disease of native coronary artery without angina pectoris: Secondary | ICD-10-CM

## 2016-07-05 DIAGNOSIS — E784 Other hyperlipidemia: Secondary | ICD-10-CM

## 2016-07-05 DIAGNOSIS — Z760 Encounter for issue of repeat prescription: Secondary | ICD-10-CM | POA: Diagnosis not present

## 2016-07-05 DIAGNOSIS — E7849 Other hyperlipidemia: Secondary | ICD-10-CM

## 2016-07-05 DIAGNOSIS — I10 Essential (primary) hypertension: Secondary | ICD-10-CM | POA: Diagnosis not present

## 2016-07-05 LAB — LIPID PANEL
CHOL/HDL RATIO: 3.3 ratio (ref <5.0)
Cholesterol: 133 mg/dL (ref <200)
HDL: 40 mg/dL — ABNORMAL LOW (ref >40)
LDL Cholesterol: 72 mg/dL (ref <100)
Triglycerides: 105 mg/dL (ref <150)
VLDL: 21 mg/dL (ref <30)

## 2016-07-05 MED ORDER — ROSUVASTATIN CALCIUM 10 MG PO TABS: 10.0000 mg | ORAL_TABLET | Freq: Every day | ORAL | 11 refills | Status: DC

## 2016-07-05 NOTE — Progress Notes (Signed)
Cardiology Office Note    Date:  07/05/2016   ID:  Tyler Wilkinson, Tyler Wilkinson 03-16-1956, MRN [id number]  PCP:  Lucious Groves, DO  Cardiologist: Sinclair Grooms, MD   Chief Complaint  Patient presents with  . Coronary Artery Disease    History of Present Illness:  Tyler Wilkinson is a 60 y.o. male with a hx of CAD, status post MI in 2006 treated with angioplasty and stent to the posterolateral branch of the RCA, HTN, HL, diabetes. Last cardiac catheterization in 05/2011 demonstrated a subtotally occluded stent in the PL branch 1.   He is not taking his statin. He denies chest discomfort. He has frequently walk several flights of stairs at work multiple times per day. He also has a walk up stairs at his apartment. No chest discomfort or excessive dyspnea.  He does have lower extremity discomfort that he feels is related to his back or neuropathy.  He has not needed to use any nitroglycerin.   Past Medical History:  Diagnosis Date  . Asthma   . CAD (coronary artery disease)    stent RCA 2006 (other residual disease). /  nuclear 2008  no ischemia, inferolateral scar.  . Cataract   . Chronic bronchitis ([place])    "anytime I get sick it goes into bronchitis; probably get it q yr"  . Daily headache    "just recently" (05/10/2014)  . Dyslipidemia   . Ejection fraction    EF 55%,echo, 2008, inferolateral hypokinesis,  . Erectile dysfunction   . Gastroparesis   . Hypertension   . Myocardial infarction 2006  . Sleep apnea    "not wearing mask" (05/10/2014)  . Type 2 diabetes mellitus with complications ([place]) dx'd 3704    Past Surgical History:  Procedure Laterality Date  . CORONARY ANGIOPLASTY WITH STENT PLACEMENT  ~ 2006   "1"  . LAPAROSCOPIC APPENDECTOMY N/A 11/16/2014   Procedure: APPENDECTOMY LAPAROSCOPIC;  Surgeon: Greer Pickerel, MD;  Location: [place];  Service: General;  Laterality: N/A;  . UMBILICAL HERNIA REPAIR N/A 11/16/2014   Procedure: OPEN PRIMARY UMBILICAL HERNIA REPAIR  ;  Surgeon: Greer Pickerel, MD;  Location: [place];  Service: General;  Laterality: N/A;    Current Medications: Outpatient Medications Prior to Visit  Medication Sig Dispense Refill  . albuterol (PROVENTIL HFA;VENTOLIN HFA) 108 (90 BASE) MCG/ACT inhaler Inhale 2 puffs into the lungs every 6 (six) hours as needed for wheezing or shortness of breath. 1 Inhaler 1  . ALPRAZolam (XANAX) 0.5 MG tablet Take 1 tablet (0.5 mg total) by mouth at bedtime as needed for anxiety or sleep. 30 tablet 2  . Arginine 500 MG CAPS Take 500 mg by mouth daily.    Marland Kitchen aspirin EC 81 MG tablet Take 1 tablet (81 mg total) by mouth daily.    . benazepril (LOTENSIN) 40 MG tablet Take 1 tablet (40 mg total) by mouth daily. 90 tablet 0  . benzonatate (TESSALON) 200 MG capsule Take 1 capsule (200 mg total) by mouth 3 (three) times daily as needed for cough. 30 capsule 1  . Blood Glucose Monitoring Suppl (BAYER CONTOUR NEXT USB MONITOR) w/Device KIT Check blood sugar up to 6 times a day 1 kit 1  . carvedilol (COREG) 6.25 MG tablet Take 1 tablet (6.25 mg total) by mouth 2 (two) times daily. 180 tablet 1  . citalopram (CELEXA) 20 MG tablet Take 1 tablet (20 mg total) by mouth daily. 30 tablet 2  . clotrimazole (LOTRIMIN)  1 % cream Apply 1 application topically 2 (two) times daily. 30 g 0  . Dextromethorphan-Guaifenesin (GUAIFENESIN DM) 400-20 MG TABS Take 1 tablet by mouth every 4 (four) hours as needed. 64 tablet 0  . Flaxseed, Linseed, (FLAX SEED OIL PO) Take by mouth daily.      Marland Kitchen glucose blood (BAYER CONTOUR NEXT TEST) test strip Check blood sugar up to 6 times a day 360 each 5  . hydrochlorothiazide (HYDRODIURIL) 25 MG tablet Take 1 tablet (25 mg total) by mouth daily. 90 tablet 3  . insulin aspart (NOVOLOG) 100 UNIT/ML injection Use to fill Vgo30 daily using 30 units basal and 2 clicks for smaller meals and 4-5 clicks for larger meals 30 mL 3  . Insulin Disposable Pump (V-GO 30) KIT 1 Units by Does not apply route daily. 30 kit  11  . insulin lispro (HUMALOG) 100 UNIT/ML injection Use with V-GO 30, use 30 units basal, 2 units with small meals and 4-6 units with larger meals 30 mL 5  . Insulin Syringe-Needle U-100 (INSULIN SYRINGE .5CC/31GX5/16") 31G X 5/16" 0.5 ML MISC Use to inject insulin 3 times a day Dx code 250.00 100 each 12  . latanoprost (XALATAN) 0.005 % ophthalmic solution Place 1 drop into both eyes at bedtime.    Marland Kitchen levocetirizine (XYZAL) 5 MG tablet Take 1 tablet (5 mg total) by mouth every evening. 30 tablet 11  . nitroGLYCERIN (NITROSTAT) 0.4 MG SL tablet Place 1 tablet (0.4 mg total) under the tongue every 5 (five) minutes as needed for chest pain (DO NOT USE IF YOU HAVE TAKEN VIAGRA). 25 tablet 2  . ONETOUCH DELICA LANCETS 36O MISC Use to check blood sugars up to 4 times a day. Dx code:E11.65 200 each 11  . sildenafil (REVATIO) 20 MG tablet Take 1 tablet (20 mg total) by mouth as needed. Take 2-5 tablets as needed for sexual activity 50 tablet 5  . triamcinolone (NASACORT AQ) 55 MCG/ACT AERO nasal inhaler Place 1 spray into the nose 2 (two) times daily. 1 Inhaler 0  . VITAMIN D, CHOLECALCIFEROL, PO Take 1 capsule by mouth daily.    . rosuvastatin (CRESTOR) 10 MG tablet Take 1 tablet (10 mg total) by mouth daily. 30 tablet 11   No facility-administered medications prior to visit.      Allergies:   Patient has no known allergies.   Social History   Social History  . Marital status: Single    Spouse name: N/A  . Number of children: N/A  . Years of education: 9   Occupational History  . 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 Smoking status: Former Smoker    Years: 0.20    Types: Cigarettes    Quit date: 01/26/1973  . Smokeless tobacco: Never Used  . Alcohol use 0.0 oz/week     Comment: Beer rarely.  . Drug use:     Types: Marijuana     Comment: Occasionally.  . Sexual activity: Not Asked   Other Topics Concern  . None   Social History Narrative    . None     Family History:  The patient's family history includes Alcoholism in his father; Diabetes in his mother.   ROS:   Please see the history of present illness.    Appetite change, chest pain, hearing loss, depression, back pain, muscle pain, snoring, and leg discomfort as mentioned above.  All other systems reviewed and are negative.  PHYSICAL EXAM:   VS:  BP 116/64 (BP Location: Left Arm)   Pulse 85   Ht 6' 5" (1.956 m)   Wt 210 lb 6.4 oz (95.4 kg)   BMI 24.95 kg/m    GEN: Well nourished, well developed, in no acute distress  HEENT: normal  Neck: no JVD, carotid bruits, or masses Cardiac: RRR; no murmurs, rubs, or gallops,no edema  Respiratory:  clear to auscultation bilaterally, normal work of breathing GI: soft, nontender, nondistended, + BS MS: no deformity or atrophy  Skin: warm and dry, no rash Neuro:  Alert and Oriented x 3, Strength and sensation are intact Psych: euthymic mood, full affect  Wt Readings from Last 3 Encounters:  07/05/16 210 lb 6.4 oz (95.4 kg)  03/06/16 211 lb 4.8 oz (95.8 kg)  01/11/16 217 lb 9.6 oz (98.7 kg)      Studies/Labs Reviewed:   EKG:  EKG  Normal sinus rhythm with overall normal appearance.  Recent Labs: No results found for requested labs within last 8760 hours.   Lipid Panel    Component Value Date/Time   CHOL 122 08/05/2014 0820   TRIG 56.0 08/05/2014 0820   HDL 31.80 (L) 08/05/2014 0820   CHOLHDL 4 08/05/2014 0820   VLDL 11.2 08/05/2014 0820   LDLCALC 79 08/05/2014 0820    Additional studies/ records that were reviewed today include:  Cardiac imaging studies:  Studies:  - LHC (05/2011):  Proximal OM2 30%, proximal LAD 50%, mid LAD 20%, distal LAD 30%, ostial D1 50%, mid RCA 20%, PL branch 1 stent subtotally occluded  with collaterals from the distal branch, proximal PL branch 2 30% - medical therapy  - Echo (01/2007):  EF 50-55%, inferoposterior hypokinesis  - Nuclear (01/2007):  EF 55%, inferior and  inferolateral scar, no ischemia  - Nuclear (05/11/14)  EF 61%, normal perfusion, no ischemia, low risk study     ASSESSMENT:    1. Coronary artery disease involving native coronary artery of native heart without angina pectoris   2. Essential hypertension   3. Other hyperlipidemia   4. Medication refill      PLAN:  In order of problems listed above:  1. Known occlusion of posterolateral branch of the right coronary. Asymptomatic. Nuclear study done 2 years ago was low risk. There are no symptoms that suggest a problem. We need aggressive risk factor modification. 2. Excellent control on the current medical regimen. 2 g sodium diet advocated. 3. He has not had a lipid panel done in 2 years. We will do a lipid panel today. We will start Crestor 10 mg per day. In 2 months he will have a follow-up liver and lipid panel.  Clinical follow-up with me in one year.    Medication Adjustments/Labs and Tests Ordered: Current medicines are reviewed at length with the patient today.  Concerns regarding medicines are outlined above.  Medication changes, Labs and Tests ordered today are listed in the Patient Instructions below. Patient Instructions  Medication Instructions:  Resume Rosuvastatin 10 mg by mouth daily  Labwork: Lab work to be done today--Lipid profile.  Your physician recommends that you return for lab work in:  2 months--Lipid and Liver profiles.  This is fasting.  The lab opens at 7:30 AM     Testing/Procedures: none  Follow-Up: Your physician wants you to follow-up in: 12 months.  You will receive a reminder letter in the mail two months in advance. If you don't receive a letter, please call our office to  schedule the follow-up appointment.   Any Other Special Instructions Will Be Listed Below (If Applicable).     If you need a refill on your cardiac medications before your next appointment, please call your pharmacy.      Signed, Sinclair Grooms, MD    07/05/2016 3:08 PM    [facility] [address], [place], [place]  [address] Phone: [phone]; Fax: [phone]

## 2016-07-05 NOTE — Patient Instructions (Addendum)
Medication Instructions:  Resume Rosuvastatin 10 mg by mouth daily  Labwork: Lab work to be done today--Lipid profile.  Your physician recommends that you return for lab work in:  2 months--Lipid and Liver profiles.  This is fasting.  The lab opens at 7:30 AM.  Scheduled for January 17,2018     Testing/Procedures: none  Follow-Up: Your physician wants you to follow-up in: 12 months.  You will receive a reminder letter in the mail two months in advance. If you don't receive a letter, please call our office to schedule the follow-up appointment.   Any Other Special Instructions Will Be Listed Below (If Applicable).     If you need a refill on your cardiac medications before your next appointment, please call your pharmacy.

## 2016-07-08 ENCOUNTER — Telehealth: Payer: Self-pay | Admitting: Interventional Cardiology

## 2016-07-08 MED ORDER — ROSUVASTATIN CALCIUM 5 MG PO TABS: 5.0000 mg | ORAL_TABLET | Freq: Every day | ORAL | 3 refills | Status: DC

## 2016-07-08 NOTE — Telephone Encounter (Signed)
[name] is calling to find out his lab results . Please call    Thanks

## 2016-07-08 NOTE — Telephone Encounter (Signed)
Informed pt of lab results. Pt verbalized understanding. 

## 2016-07-16 ENCOUNTER — Telehealth: Payer: Self-pay | Admitting: Dietician

## 2016-07-16 NOTE — Telephone Encounter (Signed)
Asking for sample of insulin because he is having to taking extra humalog for spikes after meals.  Running out of mealtime clicks to adequately control blood sugar. Reports his fasting sugar is about 150, but sometimes higher.  Requests a prescription for Humalog kwikpens ( in addition to vials fo the VGO) for correction insulin for additional mealtime coverage. Having a difficult time affording crestor. Will ask our pharmacist to assist. Requests a microalbumin, information  about the new diabetes medicines. and an appointment with his doctor. Wants a copy of his blood work mailed. (This was done.)

## 2016-07-16 NOTE — Telephone Encounter (Signed)
Agree with appointment. Thank you!  

## 2016-07-17 NOTE — Telephone Encounter (Signed)
Contacted pharmacy and they state the price is due to filling 5236-month supply ($18 copay). Tried calling patient to notify him but no answer and no voicemail. [facility]  Thank you

## 2016-07-25 ENCOUNTER — Telehealth: Payer: Self-pay | Admitting: Internal Medicine

## 2016-07-25 NOTE — Telephone Encounter (Signed)
APT. REMINDER CALL, NO ANSWER, NO VOICEMAIL °

## 2016-07-26 ENCOUNTER — Ambulatory Visit: Payer: BLUE CROSS/BLUE SHIELD

## 2016-08-08 DIAGNOSIS — H2512 Age-related nuclear cataract, left eye: Secondary | ICD-10-CM | POA: Diagnosis not present

## 2016-08-09 ENCOUNTER — Other Ambulatory Visit: Payer: Self-pay | Admitting: Dietician

## 2016-08-09 MED ORDER — V-GO 30 KIT: 1.0000 [IU] | PACK | Freq: Every day | 11 refills | Status: DC

## 2016-08-09 NOTE — Telephone Encounter (Signed)
Needs VGo reordered ASAP as his  prescription has run out.

## 2016-08-09 NOTE — Telephone Encounter (Signed)
Called and notified patient.

## 2016-08-14 DIAGNOSIS — H2512 Age-related nuclear cataract, left eye: Secondary | ICD-10-CM | POA: Diagnosis not present

## 2016-08-14 DIAGNOSIS — H25812 Combined forms of age-related cataract, left eye: Secondary | ICD-10-CM | POA: Diagnosis not present

## 2016-08-14 DIAGNOSIS — H25012 Cortical age-related cataract, left eye: Secondary | ICD-10-CM | POA: Diagnosis not present

## 2016-08-15 ENCOUNTER — Other Ambulatory Visit: Payer: Self-pay

## 2016-08-15 MED ORDER — V-GO 30 KIT: 1.0000 [IU] | PACK | Freq: Every day | 11 refills | Status: DC

## 2016-08-23 DIAGNOSIS — H2511 Age-related nuclear cataract, right eye: Secondary | ICD-10-CM | POA: Diagnosis not present

## 2016-08-26 DIAGNOSIS — H25011 Cortical age-related cataract, right eye: Secondary | ICD-10-CM | POA: Diagnosis not present

## 2016-08-26 DIAGNOSIS — H2511 Age-related nuclear cataract, right eye: Secondary | ICD-10-CM | POA: Diagnosis not present

## 2016-08-26 DIAGNOSIS — H25811 Combined forms of age-related cataract, right eye: Secondary | ICD-10-CM | POA: Diagnosis not present

## 2016-09-06 ENCOUNTER — Other Ambulatory Visit: Payer: BLUE CROSS/BLUE SHIELD

## 2016-09-17 ENCOUNTER — Other Ambulatory Visit: Payer: Self-pay | Admitting: *Deleted

## 2016-09-17 DIAGNOSIS — I10 Essential (primary) hypertension: Secondary | ICD-10-CM

## 2016-09-17 MED ORDER — BENAZEPRIL HCL 40 MG PO TABS: 40.0000 mg | ORAL_TABLET | Freq: Every day | ORAL | 1 refills | Status: DC

## 2016-09-17 MED ORDER — HYDROCHLOROTHIAZIDE 25 MG PO TABS: 25.0000 mg | ORAL_TABLET | Freq: Every day | ORAL | 1 refills | Status: DC

## 2016-09-23 ENCOUNTER — Telehealth: Payer: Self-pay | Admitting: Dietician

## 2016-09-25 NOTE — Telephone Encounter (Signed)
Patient called for diabetes self management support. Support provided. He plans to be here Thursday to see Dr. Mikey BussingHoffman.

## 2016-09-25 NOTE — Progress Notes (Deleted)
[facility] Subjective:  HPI: [name] a 61 y.o. male who presents for ***     Review of Systems: *** Objective:  Physical Exam: There were no vitals filed for this visit. Physical Exam  Assessment & Plan:  No problem-specific Assessment & Plan notes found for this encounter.   Medications Ordered No orders of the defined types were placed in this encounter.  Other Orders No orders of the defined types were placed in this encounter.  Follow Up: No Follow-up on file.

## 2016-09-26 ENCOUNTER — Ambulatory Visit: Payer: BLUE CROSS/BLUE SHIELD | Admitting: Internal Medicine

## 2016-09-26 ENCOUNTER — Encounter: Payer: Self-pay | Admitting: Internal Medicine

## 2016-09-27 ENCOUNTER — Telehealth: Payer: Self-pay | Admitting: Dietician

## 2016-09-27 NOTE — Telephone Encounter (Signed)
Missed appointment because he was working late hours. He wanted Dr. Mikey BussingHoffman to know.  Says he can tell now when his sugar gets out of whack-  Was 400 yesterday after eating a lot and had no clicks in his VGO at work. Went home because he felt bad and started urinating more. He took a shot of 20 units fast acting insulin when he got home.  He checked his sugar while we were on the phone.It  was 119. transferred to reschedule HIS APPOINTMENT

## 2016-09-30 NOTE — Telephone Encounter (Signed)
Ok thank you 

## 2016-10-03 ENCOUNTER — Telehealth: Payer: Self-pay | Admitting: Dietician

## 2016-10-03 NOTE — Telephone Encounter (Signed)
Patient requested return call but unable to reach him by phone today.

## 2016-10-04 NOTE — Telephone Encounter (Signed)
Had bad day yesterday. Had his cataract surgery.Is at Dr.Groat's office.  Asking about his kidneys. Wants to know about one of the new diabetes medicines that are helpful to people with heart diease.  Encouraged him to talk to Dr. Mikey BussingHoffman at his upcoming visit.

## 2016-10-04 NOTE — Telephone Encounter (Signed)
Agree, I would love the opportunity to discuss some of the options, I hope he keeps his next appointment.

## 2016-10-09 ENCOUNTER — Encounter: Payer: Self-pay | Admitting: *Deleted

## 2016-10-31 ENCOUNTER — Encounter: Payer: BLUE CROSS/BLUE SHIELD | Admitting: Internal Medicine

## 2016-10-31 ENCOUNTER — Telehealth: Payer: Self-pay | Admitting: Dietician

## 2016-11-04 NOTE — Telephone Encounter (Signed)
Returned call to patient.

## 2016-11-05 ENCOUNTER — Encounter: Payer: Self-pay | Admitting: Internal Medicine

## 2016-11-14 ENCOUNTER — Ambulatory Visit (INDEPENDENT_AMBULATORY_CARE_PROVIDER_SITE_OTHER): Payer: BLUE CROSS/BLUE SHIELD | Admitting: Internal Medicine

## 2016-11-14 ENCOUNTER — Encounter (INDEPENDENT_AMBULATORY_CARE_PROVIDER_SITE_OTHER): Payer: Self-pay

## 2016-11-14 ENCOUNTER — Encounter: Payer: Self-pay | Admitting: Internal Medicine

## 2016-11-14 VITALS — BP 110/52 | HR 71 | Temp 98.0°F | Ht 72.5 in | Wt 212.4 lb

## 2016-11-14 DIAGNOSIS — R197 Diarrhea, unspecified: Secondary | ICD-10-CM | POA: Diagnosis not present

## 2016-11-14 DIAGNOSIS — R531 Weakness: Secondary | ICD-10-CM | POA: Diagnosis not present

## 2016-11-14 DIAGNOSIS — R1110 Vomiting, unspecified: Secondary | ICD-10-CM | POA: Insufficient documentation

## 2016-11-14 DIAGNOSIS — Z794 Long term (current) use of insulin: Secondary | ICD-10-CM | POA: Diagnosis not present

## 2016-11-14 DIAGNOSIS — E1165 Type 2 diabetes mellitus with hyperglycemia: Secondary | ICD-10-CM | POA: Diagnosis not present

## 2016-11-14 DIAGNOSIS — R1013 Epigastric pain: Secondary | ICD-10-CM | POA: Diagnosis not present

## 2016-11-14 DIAGNOSIS — N179 Acute kidney failure, unspecified: Secondary | ICD-10-CM

## 2016-11-14 DIAGNOSIS — R6883 Chills (without fever): Secondary | ICD-10-CM | POA: Diagnosis not present

## 2016-11-14 DIAGNOSIS — R112 Nausea with vomiting, unspecified: Secondary | ICD-10-CM | POA: Diagnosis not present

## 2016-11-14 HISTORY — DX: Vomiting, unspecified: R11.10

## 2016-11-14 LAB — POCT URINALYSIS DIPSTICK
Bilirubin, UA: NEGATIVE
Glucose, UA: NEGATIVE
KETONES UA: NEGATIVE
Leukocytes, UA: NEGATIVE
Nitrite, UA: NEGATIVE
RBC UA: NEGATIVE
SPEC GRAV UA: 1.02 (ref 1.030–1.035)
UROBILINOGEN UA: 0.2 (ref ?–2.0)
pH, UA: 5.5 (ref 5.0–8.0)

## 2016-11-14 LAB — BASIC METABOLIC PANEL
Anion gap: 9 (ref 5–15)
BUN: 21 mg/dL — AB (ref 6–20)
CALCIUM: 8.9 mg/dL (ref 8.9–10.3)
CHLORIDE: 102 mmol/L (ref 101–111)
CO2: 30 mmol/L (ref 22–32)
CREATININE: 1.49 mg/dL — AB (ref 0.61–1.24)
GFR, EST AFRICAN AMERICAN: 57 mL/min — AB (ref >60)
GFR, EST NON AFRICAN AMERICAN: 49 mL/min — AB (ref >60)
Glucose, Bld: 165 mg/dL — ABNORMAL HIGH (ref 65–99)
Potassium: 4.1 mmol/L (ref 3.5–5.1)
SODIUM: 141 mmol/L (ref 135–145)

## 2016-11-14 LAB — GLUCOSE, CAPILLARY: GLUCOSE-CAPILLARY: 176 mg/dL — AB (ref 65–99)

## 2016-11-14 MED ORDER — ONDANSETRON 4 MG PO TBDP: 4.0000 mg | ORAL_TABLET | Freq: Three times a day (TID) | ORAL | 0 refills | Status: DC | PRN

## 2016-11-14 NOTE — Progress Notes (Signed)
CC: nausea, vomiting, weakness  HPI:  [name] a 61 y.o. with a PMH of T2DM, HTN, HLD, presenting to clinic for evaluation of nausea, vomiting and weakness.  Patient endorses 2 day history of nausea, vomiting, diarrhea, abdominal pain, chills, and weakness. He states that until today he has not been able to tolerate food intake, but has been drinking fluids and keeping them down (though is decreased amount from his normal liquid intake). He endorses 3 episodes of vomiting (NBNB) and 3-4 episodes of watery diarrhea (no melena or hematochezia) a day. He endorses having upper abdominal pain initially that has now moved lower and today has resolved. He has not vomited today and challenged himself with eating some bacon about one hour prior to his appointment. He had a left over nausea medicine from his cataract surgery which he took yesterday and helped calm his nausea. He has not taken anything for his diarrhea. He uses VGo for insulin and states he has continued insulin administration during his illness as well.   Please see problem based Assessment and Plan for status of patients chronic conditions.  Past Medical History:  Diagnosis Date  . Asthma   . CAD (coronary artery disease)    stent RCA 2006 (other residual disease). /  nuclear 2008  no ischemia, inferolateral scar.  . Cataract   . Chronic bronchitis ([facility])    "anytime I get sick it goes into bronchitis; probably get it q yr"  . Daily headache    "just recently" (05/10/2014)  . Dyslipidemia   . Ejection fraction    EF 55%,echo, 2008, inferolateral hypokinesis,  . Erectile dysfunction   . Gastroparesis   . Hypertension   . Myocardial infarction 2006  . Severe obstructive sleep apnea-hypopnea syndrome 04/15/2009   Sleep study 03/15/2009 AHI of 82.2/hr oxygen desaturation nadir of 77%, loud snoring. Did not tolerate CPAP due to mask.  . Type 2 diabetes mellitus with complications ([facility]) dx'd 1972    Review of Systems:     Review of Systems  Constitutional: Positive for chills. Negative for fever.  Eyes: Negative for blurred vision.  Cardiovascular: Negative for chest pain.  Gastrointestinal: Positive for abdominal pain, diarrhea, nausea and vomiting. Negative for blood in stool and melena.  Musculoskeletal: Negative for myalgias.  Neurological: Positive for weakness. Negative for dizziness and headaches.  Endo/Heme/Allergies: Negative for polydipsia.    Physical Exam:  Vitals:   11/14/16 1422  BP: (!) 110/52  Pulse: 71  Temp: 98 F (36.7 C)  TempSrc: Oral  SpO2: 98%  Weight: 212 lb 6.4 oz (96.3 kg)  Height: 6' 0.5" (1.842 m)   Physical Exam  Constitutional: He is oriented to person, place, and time. He appears well-developed and well-nourished. No distress.  HENT:  Head: Normocephalic and atraumatic.  Mouth/Throat: Oropharynx is clear and moist.  Eyes: EOM are normal. No scleral icterus.  Neck: Normal range of motion. Neck supple.  Cardiovascular: Normal rate, regular rhythm, normal heart sounds and intact distal pulses.   Pulmonary/Chest: Effort normal and breath sounds normal. He has no wheezes. He has no rales.  Abdominal: Soft. Bowel sounds are normal. He exhibits no distension and no mass. There is tenderness (mild epigastric tenderness). There is no rebound and no guarding.  Musculoskeletal: Normal range of motion. He exhibits no edema.  Neurological: He is alert and oriented to person, place, and time.  Skin: Skin is warm and dry. Capillary refill takes less than 2 seconds. No rash noted. He  is not diaphoretic. No erythema. No pallor.  Psychiatric: He has a normal mood and affect. His behavior is normal. Judgment and thought content normal.    Assessment & Plan:   See Encounters Tab for problem based charting.   Patient discussed with Dr. Lorin PicketVincent   Careem Yasui, MD Internal Medicine PGY1

## 2016-11-14 NOTE — Patient Instructions (Signed)
Drink plenty of water to get your fluids up.  If you continue to feel nauseated, I have sent in for a nausea medicine called zofran. Place on pill under your tongue to dissolve. Can use every 8 hours if needed.  I hope you get to feeling better! If you don't have any improvement or start feeling worse, don't hesitate to call us!

## 2016-11-15 ENCOUNTER — Encounter: Payer: Self-pay | Admitting: Internal Medicine

## 2016-11-15 DIAGNOSIS — N179 Acute kidney failure, unspecified: Secondary | ICD-10-CM | POA: Insufficient documentation

## 2016-11-15 NOTE — Assessment & Plan Note (Addendum)
Patient endorses 2 day history of nausea, vomiting, diarrhea, abdominal pain, chills, and weakness. He states that until today he has not been able to tolerate food intake, but has been drinking fluids and keeping them down (though is decreased amount from his normal liquid intake). He endorses 3 episodes of vomiting (NBNB) and 3-4 episodes of watery diarrhea (no melena or hematochezia) a day. He endorses having upper abdominal pain initially that has now moved lower and today has resolved. He has not vomited today and challenged himself with eating some bacon about one hour prior to his appointment. He had a left over nausea medicine from his cataract surgery which he took yesterday and helped calm his nausea. He has not taken anything for his diarrhea. He uses VGo for insulin and states he has continued insulin administration during his illness as well.   Exam reveals only mild epigastric tenderness; he has moist mucous membranes and does not appear ill.  There is concern for DKA with his presentation of abd pain, n/v in a diabetic. We checked his CBG - 176, and dipstick UA (no glucose, no ketones) prior to him leaving the office. He does not appear dehydrated on exam. He has self administered a PO challenge prior to arrival and passed. A stat BMet was drawn prior to his departure and patient was aware that if it showed evidence of metabolic acidosis, he would need to return to the hospital. Patient was in agreement with plan.  Plan: --CBG - 176 --dipstick UA - no glucose, no ketones, trace protein, no leuks/nitrites/blood --stat BMP - mild AKI with Cr 1.5, bicarb 30, no anion gap, electrolytes wnl --advised patient to increase his fluid intake --zofran sl q8hr prn for nausea/vomiting --return precautions discussed  With reassuring Bmet for DKA, his symptoms are consistent with viral GI illness.

## 2016-11-15 NOTE — Assessment & Plan Note (Signed)
Patient with nausea/vomiting/diarrhea for 2 days with decreased fluid intake. Bmet with Cr of 1.5 consistent with mild AKI; normal bicarb, electrolytes and no anion gap.  Mild AKA 2/2 fluid loss from GI illness and decreased fluid intake. Patient has been able to tolerate fluid intake; he passed self-administered PO challenge prior to arrival to office.   Plan: --encouraged PO rehydration --zofran for nausea

## 2016-11-18 NOTE — Progress Notes (Signed)
Internal Medicine Clinic Attending  Case discussed with Dr. Svalina  at the time of the visit.  We reviewed the resident's history and exam and pertinent patient test results.  I agree with the assessment, diagnosis, and plan of care documented in the resident's note.  

## 2016-11-20 ENCOUNTER — Other Ambulatory Visit: Payer: Self-pay

## 2016-11-20 DIAGNOSIS — E1143 Type 2 diabetes mellitus with diabetic autonomic (poly)neuropathy: Secondary | ICD-10-CM

## 2016-11-21 MED ORDER — INSULIN ASPART 100 UNIT/ML ~~LOC~~ SOLN
SUBCUTANEOUS | 3 refills | Status: DC
Start: 2016-11-21 — End: 2016-11-22

## 2016-11-22 MED ORDER — INSULIN ASPART 100 UNIT/ML ~~LOC~~ SOLN: SUBCUTANEOUS | 3 refills | Status: DC

## 2016-11-22 NOTE — Telephone Encounter (Signed)
Added Max daily dose of 66 units to prescription, (30 units basal and 36 units prandial)

## 2016-11-22 NOTE — Addendum Note (Signed)
Addended by: Gust Rung on: 11/22/2016 02:48 PM   Modules accepted: Orders

## 2016-11-25 ENCOUNTER — Telehealth: Payer: Self-pay | Admitting: Dietician

## 2016-11-25 NOTE — Telephone Encounter (Signed)
Patient wanted to know when insulin was sent in

## 2016-12-06 DIAGNOSIS — H02403 Unspecified ptosis of bilateral eyelids: Secondary | ICD-10-CM | POA: Diagnosis not present

## 2016-12-06 DIAGNOSIS — H40013 Open angle with borderline findings, low risk, bilateral: Secondary | ICD-10-CM | POA: Diagnosis not present

## 2016-12-19 ENCOUNTER — Ambulatory Visit (INDEPENDENT_AMBULATORY_CARE_PROVIDER_SITE_OTHER): Payer: BLUE CROSS/BLUE SHIELD | Admitting: Internal Medicine

## 2016-12-19 ENCOUNTER — Encounter: Payer: Self-pay | Admitting: Internal Medicine

## 2016-12-19 VITALS — BP 110/53 | HR 76 | Temp 97.6°F | Ht 72.0 in | Wt 212.1 lb

## 2016-12-19 DIAGNOSIS — Z87891 Personal history of nicotine dependence: Secondary | ICD-10-CM | POA: Diagnosis not present

## 2016-12-19 DIAGNOSIS — G4733 Obstructive sleep apnea (adult) (pediatric): Secondary | ICD-10-CM

## 2016-12-19 DIAGNOSIS — N529 Male erectile dysfunction, unspecified: Secondary | ICD-10-CM

## 2016-12-19 DIAGNOSIS — Z79899 Other long term (current) drug therapy: Secondary | ICD-10-CM | POA: Diagnosis not present

## 2016-12-19 DIAGNOSIS — Z7982 Long term (current) use of aspirin: Secondary | ICD-10-CM | POA: Diagnosis not present

## 2016-12-19 DIAGNOSIS — F410 Panic disorder [episodic paroxysmal anxiety] without agoraphobia: Secondary | ICD-10-CM

## 2016-12-19 DIAGNOSIS — I700 Atherosclerosis of aorta: Secondary | ICD-10-CM | POA: Diagnosis not present

## 2016-12-19 DIAGNOSIS — F339 Major depressive disorder, recurrent, unspecified: Secondary | ICD-10-CM

## 2016-12-19 DIAGNOSIS — E1143 Type 2 diabetes mellitus with diabetic autonomic (poly)neuropathy: Secondary | ICD-10-CM | POA: Diagnosis not present

## 2016-12-19 DIAGNOSIS — Z794 Long term (current) use of insulin: Secondary | ICD-10-CM | POA: Diagnosis not present

## 2016-12-19 DIAGNOSIS — I10 Essential (primary) hypertension: Secondary | ICD-10-CM

## 2016-12-19 LAB — GLUCOSE, CAPILLARY: GLUCOSE-CAPILLARY: 174 mg/dL — AB (ref 65–99)

## 2016-12-19 LAB — POCT GLYCOSYLATED HEMOGLOBIN (HGB A1C): HEMOGLOBIN A1C: 8.1

## 2016-12-19 MED ORDER — BUPROPION HCL ER (XL) 150 MG PO TB24: ORAL_TABLET | ORAL | 2 refills | Status: DC

## 2016-12-19 MED ORDER — SILDENAFIL CITRATE 20 MG PO TABS
20.0000 mg | ORAL_TABLET | ORAL | 5 refills | Status: DC | PRN
Start: 2016-12-19 — End: 2018-03-26

## 2016-12-19 MED ORDER — CARVEDILOL 6.25 MG PO TABS: 6.2500 mg | ORAL_TABLET | Freq: Two times a day (BID) | ORAL | 1 refills | Status: DC

## 2016-12-19 MED ORDER — ALPRAZOLAM 0.5 MG PO TABS: 0.5000 mg | ORAL_TABLET | Freq: Every evening | ORAL | 2 refills | Status: DC | PRN

## 2016-12-19 MED ORDER — V-GO 40 KIT: 1.0000 [IU] | PACK | Freq: Every morning | 11 refills | Status: DC

## 2016-12-19 MED ORDER — ROSUVASTATIN CALCIUM 5 MG PO TABS: 5.0000 mg | ORAL_TABLET | Freq: Every day | ORAL | 3 refills | Status: DC

## 2016-12-19 NOTE — Progress Notes (Signed)
[facility] Subjective:  HPI: Tyler Wilkinson is a 61 y.o. male who presents for overdue follow up of HTN and DM.  Please see problem based charting below for the status of his chronic medical issues.  Review of Systems: Review of Systems  Constitutional: Positive for malaise/fatigue.  Eyes: Negative for blurred vision.  Respiratory: Negative for cough.   Cardiovascular: Negative for chest pain.  Gastrointestinal: Negative for constipation.  Genitourinary: Negative for frequency.  Musculoskeletal: Negative for falls and myalgias.  Neurological: Negative for dizziness.  Endo/Heme/Allergies: Negative for polydipsia.  Psychiatric/Behavioral: The patient does not have insomnia.     Objective:  Physical Exam: Vitals:   12/19/16 1111  BP: (!) 110/53  Pulse: 76  Temp: 97.6 F (36.4 C)  TempSrc: Oral  SpO2: 100%  Weight: 212 lb 1.6 oz (96.2 kg)  Height: 6' (1.829 m)  Physical Exam  Constitutional: He is well-developed, well-nourished, and in no distress. No distress.  HENT:  Head: Normocephalic and atraumatic.  Eyes: Conjunctivae are normal.  Cardiovascular: Normal rate, regular rhythm, normal heart sounds and intact distal pulses.   No murmur heard. Pulmonary/Chest: Effort normal and breath sounds normal. No respiratory distress. He has no wheezes. He has no rales.  Abdominal: Soft. Bowel sounds are normal. He exhibits no distension. There is no tenderness.  Musculoskeletal: He exhibits no edema.  Skin: Skin is warm and dry. He is not diaphoretic.  Psychiatric: Affect and judgment normal.  Nursing note and vitals reviewed.  Assessment & Plan:  Essential hypertension History of present illness: Currently taking hydrochlorothiazide 25 mg, carvedilol 6.25 mg twice a day and benazepril 40 mg daily he has no complaints.  Assessment essential hypertension well controlled  Plan Continue atorvastatin 25 mg daily, carvedilol 6.25 mg twice a day, 40 mg  of benazepril daily  Abdominal aortic atherosclerosis (HCC) His previous CT in 2016 is notable for abdominal aortic atherosclerosis, he is currently treated with 81 mg aspirin and 5 mg of Crestor  Severe obstructive sleep apnea-hypopnea syndrome History of present illness he reports he does not get good sleep he feels tired throughout the day he does that she snores a lot and he knows he has sleep apnea. His previous sleep study was in 2010 which showed severe sleep apnea with an AHI of 82. He was treated after this with CPAP however he was unable to tolerate the mask he reports it felt like he was suffocating him.  He recently went to see the orthodontist Dr. Ron Parker for potential oral mandibular device therapy. However he was told this will cause $2000. He is not sure he can afford this.  Assessment severe obstructive sleep apnea  Plan: I discussed the baby's mother options on the market given the due to severe the best therapy would be CPAP however oral mandibular device therapy has been shown to be effective in mild to moderate OSA. I think the first step may be to repeat a split-night study and see if he can tolerate a different mask.   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 He is very comfortable with this device and he feels overall to be working well however sometimes he needs additional insulin which she uses with subcutaneous injection. He did not bring his meter today he does report to me that he is had no hypoglycemia, his a.m. sugars have ranged mostly in the high 100s in the morning,  the worst was 300. He thinks that the sugars are improved later in the day.  Assessment uncontrolled type 2 diabetes with neuropathy  Plan We will increase into the VGo 40 pump.  Major depression, recurrent, chronic ([place]) He reports he is continuing to have depressed feelings, he is fatigued he has low energy and  decreased concentration.  Assessment major depressive disorder  Plan I suspect his depression could be multifactorial for one he has untreated sleep apnea, which could be contributing to his fatigue, his also his diabetes is not as controlled as I would like. I think we'll work on both of these issues as outlined above however he would also benefit from medical therapy. He is 30 having some sexual dysfunction so I want to avoid an SSRI we will try Wellbutrin XR 150 mg twice a day.   ERECTILE DYSFUNCTION, ORGANIC He reports he has been using the sildenafil and he is able to obtain an erection, however he does not feel that the sildenafil last as long as it was previously. He is currently sexually active with a new partner and doesn't feel that he is adequately satisfied her.    Assessment erectile dysfunction  Plan I have given him a refill of the sildenafil, however like to get his depression as well as diabetes under better control. Once this is done and she is still having worsening erectile dysfunction we may consider referral to urology.   Medications Ordered Meds ordered this encounter  Medications  . COSOPT PF 22.3-6.8 MG/ML SOLN ophthalmic solution    Sig: INSTILL 1 DROP INTO BOTH EYES TWICE A DAY    Refill:  3  . sildenafil (REVATIO) 20 MG tablet    Sig: Take 1 tablet (20 mg total) by mouth as needed. Take 2-5 tablets as needed for sexual activity    Dispense:  50 tablet    Refill:  5  . ALPRAZolam (XANAX) 0.5 MG tablet    Sig: Take 1 tablet (0.5 mg total) by mouth at bedtime as needed for anxiety or sleep.    Dispense:  30 tablet    Refill:  2  . carvedilol (COREG) 6.25 MG tablet    Sig: Take 1 tablet (6.25 mg total) by mouth 2 (two) times daily.    Dispense:  180 tablet    Refill:  1  . rosuvastatin (CRESTOR) 5 MG tablet    Sig: Take 1 tablet (5 mg total) by mouth daily.    Dispense:  90 tablet    Refill:  3  . buPROPion (WELLBUTRIN XL) 150 MG 24 hr tablet    Sig:  Take 1 pill once a day for 3 days then start taking twice daily.    Dispense:  60 tablet    Refill:  2  . Insulin Disposable Pump (V-GO 40) KIT    Sig: 1 Units by Does not apply route every morning.    Dispense:  30 kit    Refill:  11   Other Orders Orders Placed This Encounter  Procedures  . CMP14 + Anion Gap  . CBC no Diff  . Lipid Profile  . Glucose, capillary  . POC Hbg A1C  . Split night study    Standing Status:   Future    Standing Expiration Date:   12/19/2017    Order Specific Question:   Where should this test be performed:    Answer:   [facility]   Follow Up: Return in about 3 months (around  03/21/2017).

## 2016-12-19 NOTE — Patient Instructions (Signed)
I am sending you back for a repeat sleep study. I am going to start you on Wellbutrin for your depression.

## 2016-12-20 ENCOUNTER — Telehealth: Payer: Self-pay | Admitting: Dietician

## 2016-12-20 ENCOUNTER — Encounter: Payer: Self-pay | Admitting: Dietician

## 2016-12-20 LAB — CMP14 + ANION GAP
ALT: 34 IU/L (ref 0–44)
ANION GAP: 15 mmol/L (ref 10.0–18.0)
AST: 25 IU/L (ref 0–40)
Albumin/Globulin Ratio: 1.7 (ref 1.2–2.2)
Albumin: 3.8 g/dL (ref 3.6–4.8)
Alkaline Phosphatase: 86 IU/L (ref 39–117)
BUN/Creatinine Ratio: 22 (ref 10–24)
BUN: 27 mg/dL (ref 8–27)
Bilirubin Total: 0.4 mg/dL (ref 0.0–1.2)
CALCIUM: 9.2 mg/dL (ref 8.6–10.2)
CHLORIDE: 101 mmol/L (ref 96–106)
CO2: 22 mmol/L (ref 18–29)
CREATININE: 1.25 mg/dL (ref 0.76–1.27)
GFR, EST AFRICAN AMERICAN: 71 mL/min/{1.73_m2} (ref >59)
GFR, EST NON AFRICAN AMERICAN: 62 mL/min/{1.73_m2} (ref >59)
Globulin, Total: 2.2 g/dL (ref 1.5–4.5)
Glucose: 200 mg/dL — ABNORMAL HIGH (ref 65–99)
Potassium: 4 mmol/L (ref 3.5–5.2)
Sodium: 138 mmol/L (ref 134–144)
TOTAL PROTEIN: 6 g/dL (ref 6.0–8.5)

## 2016-12-20 LAB — CBC
HEMATOCRIT: 41.4 % (ref 37.5–51.0)
HEMOGLOBIN: 14.2 g/dL (ref 13.0–17.7)
MCH: 28.3 pg (ref 26.6–33.0)
MCHC: 34.3 g/dL (ref 31.5–35.7)
MCV: 83 fL (ref 79–97)
Platelets: 193 10*3/uL (ref 150–379)
RBC: 5.01 x10E6/uL (ref 4.14–5.80)
RDW: 13.8 % (ref 12.3–15.4)
WBC: 6.5 10*3/uL (ref 3.4–10.8)

## 2016-12-20 LAB — LIPID PANEL
CHOL/HDL RATIO: 3.6 ratio (ref 0.0–5.0)
Cholesterol, Total: 127 mg/dL (ref 100–199)
HDL: 35 mg/dL — ABNORMAL LOW (ref >39)
LDL CALC: 75 mg/dL (ref 0–99)
TRIGLYCERIDES: 84 mg/dL (ref 0–149)
VLDL Cholesterol Cal: 17 mg/dL (ref 5–40)

## 2016-12-20 NOTE — Telephone Encounter (Signed)
Returned call asking about labs and a1c/blood sugars. He says he has about 10 clicks left from yesterday on his VGO. Explained that this means he likely did not take enough clicks yesterday as he should be using about 12-15 out of the 18 clicks(24-30 units)  a day to cover his food intake.  Asked him to bring meter in and consider talking to his doctor about wearing the CGM if he finds his insurance will cover it.

## 2016-12-20 NOTE — Assessment & Plan Note (Signed)
He reports he is continuing to have depressed feelings, he is fatigued he has low energy and decreased concentration.  Assessment major depressive disorder  Plan I suspect his depression could be multifactorial for one he has untreated sleep apnea, which could be contributing to his fatigue, his also his diabetes is not as controlled as I would like. I think we'll work on both of these issues as outlined above however he would also benefit from medical therapy. He is 30 having some sexual dysfunction so I want to avoid an SSRI we will try Wellbutrin XR 150 mg twice a day.

## 2016-12-20 NOTE — Assessment & Plan Note (Signed)
History of present illness: Currently taking hydrochlorothiazide 25 mg, carvedilol 6.25 mg twice a day and benazepril 40 mg daily he has no complaints.  Assessment essential hypertension well controlled  Plan Continue atorvastatin 25 mg daily, carvedilol 6.25 mg twice a day, 40 mg of benazepril daily

## 2016-12-20 NOTE — Assessment & Plan Note (Signed)
He reports he has been using the sildenafil and he is able to obtain an erection, however he does not feel that the sildenafil last as long as it was previously. He is currently sexually active with a new partner and doesn't feel that he is adequately satisfied her.    Assessment erectile dysfunction  Plan I have given him a refill of the sildenafil, however like to get his depression as well as diabetes under better control. Once this is done and she is still having worsening erectile dysfunction we may consider referral to urology.

## 2016-12-20 NOTE — Assessment & Plan Note (Signed)
His previous CT in 2016 is notable for abdominal aortic atherosclerosis, he is currently treated with 81 mg aspirin and 5 mg of Crestor

## 2016-12-20 NOTE — Assessment & Plan Note (Signed)
History of present illness:  He has been using his Vgo 30 device. He is very comfortable with this device and he feels overall to be working well however sometimes he needs additional insulin which she uses with subcutaneous injection. He did not bring his meter today he does report to me that he is had no hypoglycemia, his a.m. sugars have ranged mostly in the high 100s in the morning, the worst was 300. He thinks that the sugars are improved later in the day.  Assessment uncontrolled type 2 diabetes with neuropathy  Plan We will increase into the VGo 40 pump.

## 2016-12-20 NOTE — Assessment & Plan Note (Addendum)
History of present illness he reports he does not get good sleep he feels tired throughout the day he does that she snores a lot and he knows he has sleep apnea. His previous sleep study was in 2010 which showed severe sleep apnea with an AHI of 82. He was treated after this with CPAP however he was unable to tolerate the mask he reports it felt like he was suffocating him.  He recently went to see the orthodontist Dr. Myrtis SerKatz for potential oral mandibular device therapy. However he was told this will cause $2000. He is not sure he can afford this.  Assessment severe obstructive sleep apnea  Plan: I discussed the baby's mother options on the market given the due to severe the best therapy would be CPAP however oral mandibular device therapy has been shown to be effective in mild to moderate OSA. I think the first step may be to repeat a split-night study and see if he can tolerate a different mask.  ADDENDUM: Received Peer to peer request, discussed my concerns, insurance will cover a CPAP tirtation or A-PAP (auto-CPAP) device however no reason to do split night as he has not lost significant amount of weight or other condition.

## 2016-12-27 NOTE — Addendum Note (Signed)
Addended by: Carlynn PurlHOFFMAN, ERIK C on: 12/27/2016 03:59 PM   Modules accepted: Orders

## 2017-03-13 ENCOUNTER — Other Ambulatory Visit: Payer: Self-pay | Admitting: *Deleted

## 2017-03-13 DIAGNOSIS — I10 Essential (primary) hypertension: Secondary | ICD-10-CM

## 2017-03-13 MED ORDER — BENAZEPRIL HCL 40 MG PO TABS: 40.0000 mg | ORAL_TABLET | Freq: Every day | ORAL | 1 refills | Status: DC

## 2017-04-01 ENCOUNTER — Other Ambulatory Visit: Payer: Self-pay | Admitting: *Deleted

## 2017-04-01 DIAGNOSIS — E1143 Type 2 diabetes mellitus with diabetic autonomic (poly)neuropathy: Secondary | ICD-10-CM

## 2017-04-01 MED ORDER — INSULIN ASPART 100 UNIT/ML ~~LOC~~ SOLN: SUBCUTANEOUS | 3 refills | Status: DC

## 2017-04-02 ENCOUNTER — Other Ambulatory Visit: Payer: Self-pay | Admitting: *Deleted

## 2017-04-02 DIAGNOSIS — F410 Panic disorder [episodic paroxysmal anxiety] without agoraphobia: Secondary | ICD-10-CM

## 2017-04-03 MED ORDER — ALPRAZOLAM 0.5 MG PO TABS: 0.5000 mg | ORAL_TABLET | Freq: Every evening | ORAL | 2 refills | Status: DC | PRN

## 2017-04-03 NOTE — Telephone Encounter (Signed)
Xanax rx called to [facility]. 

## 2017-04-03 NOTE — Telephone Encounter (Signed)
Please phone in

## 2017-05-05 ENCOUNTER — Other Ambulatory Visit: Payer: Self-pay

## 2017-05-05 DIAGNOSIS — I10 Essential (primary) hypertension: Secondary | ICD-10-CM

## 2017-05-05 MED ORDER — HYDROCHLOROTHIAZIDE 25 MG PO TABS: 25.0000 mg | ORAL_TABLET | Freq: Every day | ORAL | 1 refills | Status: DC

## 2017-05-05 NOTE — Telephone Encounter (Signed)
hydrochlorothiazide (HYDRODIURIL) 25 MG tablet,refill request @ [facility] on [address].

## 2017-05-06 NOTE — Telephone Encounter (Signed)
Patient called for HCTZ. Informed him it was sent to [facility]. He wanted it to go to [facility] on [address]. He is going to ask for it to be transferred.

## 2017-05-13 ENCOUNTER — Ambulatory Visit (HOSPITAL_BASED_OUTPATIENT_CLINIC_OR_DEPARTMENT_OTHER): Payer: BLUE CROSS/BLUE SHIELD | Attending: Internal Medicine | Admitting: Internal Medicine

## 2017-05-13 ENCOUNTER — Other Ambulatory Visit: Payer: Self-pay | Admitting: *Deleted

## 2017-05-13 DIAGNOSIS — G4733 Obstructive sleep apnea (adult) (pediatric): Secondary | ICD-10-CM

## 2017-05-13 DIAGNOSIS — I10 Essential (primary) hypertension: Secondary | ICD-10-CM

## 2017-05-13 MED ORDER — HYDROCHLOROTHIAZIDE 25 MG PO TABS: 25.0000 mg | ORAL_TABLET | Freq: Every day | ORAL | 3 refills | Status: DC

## 2017-05-19 ENCOUNTER — Other Ambulatory Visit (HOSPITAL_BASED_OUTPATIENT_CLINIC_OR_DEPARTMENT_OTHER): Payer: Self-pay

## 2017-05-19 ENCOUNTER — Telehealth: Payer: Self-pay | Admitting: Dietician

## 2017-05-19 ENCOUNTER — Other Ambulatory Visit: Payer: Self-pay | Admitting: *Deleted

## 2017-05-19 DIAGNOSIS — E1143 Type 2 diabetes mellitus with diabetic autonomic (poly)neuropathy: Secondary | ICD-10-CM

## 2017-05-19 DIAGNOSIS — G4733 Obstructive sleep apnea (adult) (pediatric): Secondary | ICD-10-CM

## 2017-05-19 MED ORDER — GLUCOSE BLOOD VI STRP: ORAL_STRIP | 5 refills | Status: DC

## 2017-05-21 ENCOUNTER — Encounter: Payer: Self-pay | Admitting: Dietician

## 2017-05-21 ENCOUNTER — Encounter (INDEPENDENT_AMBULATORY_CARE_PROVIDER_SITE_OTHER): Payer: Self-pay

## 2017-05-21 ENCOUNTER — Ambulatory Visit (INDEPENDENT_AMBULATORY_CARE_PROVIDER_SITE_OTHER): Payer: BLUE CROSS/BLUE SHIELD | Admitting: Internal Medicine

## 2017-05-21 VITALS — BP 141/73 | HR 85 | Temp 98.0°F | Ht 72.5 in | Wt 217.7 lb

## 2017-05-21 DIAGNOSIS — H6121 Impacted cerumen, right ear: Secondary | ICD-10-CM

## 2017-05-21 DIAGNOSIS — Z23 Encounter for immunization: Secondary | ICD-10-CM | POA: Diagnosis not present

## 2017-05-21 DIAGNOSIS — J069 Acute upper respiratory infection, unspecified: Secondary | ICD-10-CM

## 2017-05-21 DIAGNOSIS — Z87891 Personal history of nicotine dependence: Secondary | ICD-10-CM | POA: Diagnosis not present

## 2017-05-21 DIAGNOSIS — G4733 Obstructive sleep apnea (adult) (pediatric): Secondary | ICD-10-CM

## 2017-05-21 DIAGNOSIS — Z9989 Dependence on other enabling machines and devices: Secondary | ICD-10-CM | POA: Diagnosis not present

## 2017-05-21 HISTORY — DX: Acute upper respiratory infection, unspecified: J06.9

## 2017-05-21 MED ORDER — ALBUTEROL SULFATE HFA 108 (90 BASE) MCG/ACT IN AERS: 2.0000 | INHALATION_SPRAY | Freq: Four times a day (QID) | RESPIRATORY_TRACT | 1 refills | Status: DC | PRN

## 2017-05-21 MED ORDER — FLUTICASONE PROPIONATE 50 MCG/ACT NA SUSP: 2.0000 | Freq: Every day | NASAL | 0 refills | Status: AC

## 2017-05-21 MED ORDER — CHLORPHENIRAMINE-ACETAMINOPHEN 2-325 MG PO TABS: 1.0000 | ORAL_TABLET | Freq: Four times a day (QID) | ORAL | 0 refills | Status: DC | PRN

## 2017-05-21 NOTE — Assessment & Plan Note (Signed)
Patient is presenting with complaint of cough, sinus congestion, and ear fullness x 4 days. On exam lungs are clear aside from mildly inspiratory wheezing on the right. He is afebrile and denies fevers at home. Likely viral URI, educated patient on supportive care. Provided return precautions.  -- Refilled albuterol prn -- Coricidin q6h prn  -- Flonase 2 sprays daily x 2 weeks  -- Flu shot today -- Follow up as needed

## 2017-05-21 NOTE — Progress Notes (Signed)
   CC: Cough, congestion   HPI:  [name] a 61 y.o. male with past medical history outlined below here for symptoms of cough and congestion. For the details of today's visit, please refer to the assessment and plan.  Past Medical History:  Diagnosis Date  . Asthma   . CAD (coronary artery disease)    stent RCA 2006 (other residual disease). /  nuclear 2008  no ischemia, inferolateral scar.  . Cataract   . Chronic bronchitis (HCC)    "anytime I get sick it goes into bronchitis; probably get it q yr"  . Daily headache    "just recently" (05/10/2014)  . Dyslipidemia   . Ejection fraction    EF 55%,echo, 2008, inferolateral hypokinesis,  . Erectile dysfunction   . Gastroparesis   . Hypertension   . Myocardial infarction (HCC) 2006  . Severe obstructive sleep apnea-hypopnea syndrome 04/15/2009   Sleep study 03/15/2009 AHI of 82.2/hr oxygen desaturation nadir of 77%, loud snoring. Did not tolerate CPAP due to mask.  . 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  Respiratory: Positive for cough. Negative for shortness of breath and wheezing.      Physical Exam:  Vitals:   05/21/17 1343  BP: (!) 141/73  Pulse: 85  Temp: 98 F (36.7 C)  TempSrc: Oral  SpO2: 99%  Weight: 217 lb 11.2 oz (98.7 kg)  Height: 6' 0.5" (1.842 m)    Constitutional: NAD, appears comfortable HEENT: Normal posterior oropharynx, no evidence of tonsillitis, right cerumen impaction   Cardiovascular: RRR, no murmurs, rubs, or gallops.  Pulmonary/Chest: CTAB, mild inspiratory wheezing at R middle lobe Psychiatric: Normal mood and affect  Assessment & Plan:   See Encounters Tab for problem based charting.  Patient discussed with Dr. Cleda Daub

## 2017-05-21 NOTE — Progress Notes (Signed)
  Patient Name: Tyler Wilkinson, Tyler Wilkinson Study Date: 05/13/2017 Gender: Male D.O.B: 09/08/1955 Age (years): 61 Referring Provider: Eric C Hoffman DO Height (inches): 73 Interpreting Physician: Freeland Pracht MD, ABSM Weight (lbs): 215 RPSGT: [place], [place] BMI: 28 MRN: [id number] Neck Size: 18.50 CLINICAL INFORMATION Sleep Study Type: Split Night CPAP  Indication for sleep study: OSA  Epworth Sleepiness Score: 16  SLEEP STUDY TECHNIQUE As per the AASM Manual for the Scoring of Sleep and Associated Events v2.3 (April 2016) with a hypopnea requiring 4% desaturations.  The channels recorded and monitored were frontal, central and occipital EEG, electrooculogram (EOG), submentalis EMG (chin), nasal and oral airflow, thoracic and abdominal wall motion, anterior tibialis EMG, snore microphone, electrocardiogram, and pulse oximetry. Continuous positive airway pressure (CPAP) was initiated when the patient met split night criteria and was titrated according to treat sleep-disordered breathing.  MEDICATIONS Medications self-administered by patient taken the night of the study : none reported  RESPIRATORY PARAMETERS Diagnostic  Total AHI (/hr): 46.2 RDI (/hr): 57.1 OA Index (/hr): 10.5 CA Index (/hr): 0.4 REM AHI (/hr): 28.6 NREM AHI (/hr): 51.4 Supine AHI (/hr): 88.6 Non-supine AHI (/hr): 21.01 Min O2 Sat (%): 80.00 Mean O2 (%): 93.23 Time below 88% (min): 11.2   Titration  Optimal Pressure (cm): 14 AHI at Optimal Pressure (/hr): 2.0 Min O2 at Optimal Pressure (%): 88.0 Supine % at Optimal (%): 0 Sleep % at Optimal (%):    SLEEP ARCHITECTURE The recording time for the entire night was 358.8 minutes.  During a baseline period of 143.4 minutes, the patient slept for 136.5 minutes in REM and nonREM, yielding a sleep efficiency of 95.2%. Sleep onset after lights out was 0.1 minutes with a REM latency of 48.0 minutes. The patient spent 13.92% of the night in stage N1 sleep, 63.00% in stage  N2 sleep, 0.00% in stage N3 and 23.08% in REM.  During the titration period of 211.0 minutes, the patient slept for 185.0 minutes in REM and nonREM, yielding a sleep efficiency of 87.7%. Sleep onset after CPAP initiation was 0.8 minutes with a REM latency of 71.5 minutes. The patient spent 9.73% of the night in stage N1 sleep, 64.59% in stage N2 sleep, 0.00% in stage N3 and 25.68% in REM.  CARDIAC DATA The 2 lead EKG demonstrated sinus rhythm. The mean heart rate was 65.89 beats per minute. Other EKG findings include: None.  LEG MOVEMENT DATA The total Periodic Limb Movements of Sleep (PLMS) were 0. The PLMS index was 0.00 .  IMPRESSIONS - Severe obstructive sleep apnea occurred during the diagnostic portion of the study (AHI = 46.2/hour). An optimal PAP pressure was selected for this patient ( 14 cm of water) - No significant central sleep apnea occurred during the diagnostic portion of the study (CAI = 0.4/hour). - Mild oxygen desaturation was noted during the diagnostic portion of the study (Min O2 = 80.00%). - The patient snored with loud snoring volume during the diagnostic portion of the study. - No cardiac abnormalities were noted during this study. - Clinically significant periodic limb movements did not occur during sleep.  DIAGNOSIS - Obstructive Sleep Apnea (327.23 [G47.33 ICD-10])  RECOMMENDATIONS - Trial of CPAP therapy on 14 cm H2O with a Medium size Fisher&Paykel Full Face Mask Simplus mask and heated humidification. - Be careful with alcohol, sedatives and other CNS depressants that may worsen sleep apnea and disrupt normal sleep architecture. - Sleep hygiene should be reviewed to assess factors that may improve sleep quality. - Weight management   and regular exercise should be initiated or continued.  [Electronically signed] 05/21/2017 08:11 PM  Earnie Rockhold MD, ABSM Diplomate, American Board of Sleep Medicine   NPI: [id number]  Jirah Rider D Diplomate, American  Board of Sleep Medicine  ELECTRONICALLY SIGNED ON:  05/21/2017, 2:53 PM [facility] PH: [phone]   FX: [phone] ACCREDITED BY THE AMERICAN ACADEMY OF SLEEP MEDICINE   

## 2017-05-21 NOTE — Telephone Encounter (Signed)
Left message for return call as patient did not leave detailed message on voicemail

## 2017-05-21 NOTE — Assessment & Plan Note (Signed)
Patient had a recent repeat split night sleep study to adjust mask fitting on 05/13/17. Partial report is available in epic with mask settings (Manufacture - Fisher&Paykel, Style - Simplus, Full Face Mask size Medium, optimal pressure of 14 cm H20), however marked as "no show" under encounter and full report is pending. Will follow up and re-order mask pending results. Of note, patient feels that he needs a new C-PAP machine as well. Will address once full report and epic encounter is fixed.  -- F/u PCP

## 2017-05-21 NOTE — Progress Notes (Signed)
Internal Medicine Clinic Attending  Case discussed with Dr. Guilloud at the time of the visit.  We reviewed the resident's history and exam and pertinent patient test results.  I agree with the assessment, diagnosis, and plan of care documented in the resident's note.  

## 2017-05-21 NOTE — Patient Instructions (Signed)
Mr. President,  It was a pleasure to see you today. I am sorry to hear that you have been sick. For your cold & cough, you may take coricidin as needed every 6 hours. I have also sent a prescription for Flonase, a nasal steroid spray. Please use this 2 sprays each nostril daily. I have also refilled your albuterol inhaler. If you have any questions or concerns, call our clinic at [phone] or after hours call [phone] and ask for the internal medicine resident on call. Thank you!  - Dr. Antony Contras

## 2017-05-21 NOTE — Assessment & Plan Note (Signed)
Complaining of ear fullness in the setting of URI. Exam with cerumen impaction on the right. RN provided irrigation. Supportive care for URI (see A&P).

## 2017-05-22 NOTE — Procedures (Signed)
Patient Name: Tyler Wilkinson, Tyler Wilkinson Date: 05/13/2017 Gender: Male D.O.B: [birth year] Age (years): 29 Referring Provider: Lemar Lofty DO Height (inches): 73 Interpreting Physician: Baird Lyons MD, ABSM Weight (lbs): 215 RPSGT: Jacolyn Reedy BMI: 28 MRN: [id number] Neck Size: 18.50 CLINICAL INFORMATION Sleep Study Type: Split Night CPAP  Indication for sleep study: OSA  Epworth Sleepiness Score: 16  SLEEP STUDY TECHNIQUE As per the AASM Manual for the Scoring of Sleep and Associated Events v2.3 (April 2016) with a hypopnea requiring 4% desaturations.  The channels recorded and monitored were frontal, central and occipital EEG, electrooculogram (EOG), submentalis EMG (chin), nasal and oral airflow, thoracic and abdominal wall motion, anterior tibialis EMG, snore microphone, electrocardiogram, and pulse oximetry. Continuous positive airway pressure (CPAP) was initiated when the patient met split night criteria and was titrated according to treat sleep-disordered breathing.  MEDICATIONS Medications self-administered by patient taken the night of the study : none reported  RESPIRATORY PARAMETERS Diagnostic  Total AHI (/hr): 46.2 RDI (/hr): 57.1 OA Index (/hr): 10.5 CA Index (/hr): 0.4 REM AHI (/hr): 28.6 NREM AHI (/hr): 51.4 Supine AHI (/hr): 88.6 Non-supine AHI (/hr): 21.01 Min O2 Sat (%): 80.00 Mean O2 (%): 93.23 Time below 88% (min): 11.2   Titration  Optimal Pressure (cm): 14 AHI at Optimal Pressure (/hr): 2.0 Min O2 at Optimal Pressure (%): 88.0 Supine % at Optimal (%): 0 Sleep % at Optimal (%):    SLEEP ARCHITECTURE The recording time for the entire night was 358.8 minutes.  During a baseline period of 143.4 minutes, the patient slept for 136.5 minutes in REM and nonREM, yielding a sleep efficiency of 95.2%. Sleep onset after lights out was 0.1 minutes with a REM latency of 48.0 minutes. The patient spent 13.92% of the night in stage N1 sleep, 63.00% in stage  N2 sleep, 0.00% in stage N3 and 23.08% in REM.  During the titration period of 211.0 minutes, the patient slept for 185.0 minutes in REM and nonREM, yielding a sleep efficiency of 87.7%. Sleep onset after CPAP initiation was 0.8 minutes with a REM latency of 71.5 minutes. The patient spent 9.73% of the night in stage N1 sleep, 64.59% in stage N2 sleep, 0.00% in stage N3 and 25.68% in REM.  CARDIAC DATA The 2 lead EKG demonstrated sinus rhythm. The mean heart rate was 65.89 beats per minute. Other EKG findings include: None.  LEG MOVEMENT DATA The total Periodic Limb Movements of Sleep (PLMS) were 0. The PLMS index was 0.00 .  IMPRESSIONS - Severe obstructive sleep apnea occurred during the diagnostic portion of the study (AHI = 46.2/hour). An optimal PAP pressure was selected for this patient ( 14 cm of water) - No significant central sleep apnea occurred during the diagnostic portion of the study (CAI = 0.4/hour). - Mild oxygen desaturation was noted during the diagnostic portion of the study (Min O2 = 80.00%). - The patient snored with loud snoring volume during the diagnostic portion of the study. - No cardiac abnormalities were noted during this study. - Clinically significant periodic limb movements did not occur during sleep.  DIAGNOSIS - Obstructive Sleep Apnea (327.23 [G47.33 ICD-10])  RECOMMENDATIONS - Trial of CPAP therapy on 14 cm H2O with a Medium size Fisher&Paykel Full Face Mask Simplus mask and heated humidification. - Be careful with alcohol, sedatives and other CNS depressants that may worsen sleep apnea and disrupt normal sleep architecture. - Sleep hygiene should be reviewed to assess factors that may improve sleep quality. - Weight management  and regular exercise should be initiated or continued.  [Electronically signed] 05/21/2017 08:11 PM  Baird Lyons MD, [place], American Board of Sleep Medicine   NPI: [id number]  [place], [place] of Sleep [facility]  ELECTRONICALLY SIGNED ON:  05/21/2017, 2:53 PM [place] PH: [phone]   FX: [phone] [place]

## 2017-05-23 ENCOUNTER — Encounter: Payer: Self-pay | Admitting: Dietician

## 2017-05-23 ENCOUNTER — Ambulatory Visit (INDEPENDENT_AMBULATORY_CARE_PROVIDER_SITE_OTHER): Payer: BLUE CROSS/BLUE SHIELD | Admitting: Internal Medicine

## 2017-05-23 ENCOUNTER — Telehealth: Payer: Self-pay | Admitting: Dietician

## 2017-05-23 DIAGNOSIS — K029 Dental caries, unspecified: Secondary | ICD-10-CM

## 2017-05-23 DIAGNOSIS — K0889 Other specified disorders of teeth and supporting structures: Secondary | ICD-10-CM | POA: Diagnosis not present

## 2017-05-23 DIAGNOSIS — Z87891 Personal history of nicotine dependence: Secondary | ICD-10-CM

## 2017-05-23 DIAGNOSIS — R220 Localized swelling, mass and lump, head: Secondary | ICD-10-CM | POA: Diagnosis not present

## 2017-05-23 DIAGNOSIS — R590 Localized enlarged lymph nodes: Secondary | ICD-10-CM

## 2017-05-23 HISTORY — DX: Other specified disorders of teeth and supporting structures: K08.89

## 2017-05-23 MED ORDER — AMOXICILLIN-POT CLAVULANATE 875-125 MG PO TABS: 1.0000 | ORAL_TABLET | Freq: Two times a day (BID) | ORAL | 0 refills | Status: DC

## 2017-05-23 NOTE — Patient Instructions (Signed)
Thank you for your visit today Please take the antibiotic twice a day for 5 days Please follow up with the dentist as early as possible Please take ibuprofen as needed to help with your pain and swelling Please if you develop fevers, worsening swelling, or pain when swallowing then go to the ER   Dental Caries Dental caries are spots of decay (cavities) in teeth. They are in the outer layer of your tooth (enamel). Treat them as soon as you can. If they are not treated, they can spread decay and lead to painful infection. Follow these instructions at home: General instructions  Take good care of your mouth and teeth. This keeps them healthy. ? Brush your teeth 2 times a day. Use toothpaste with fluoride in it. ? Floss your teeth once a day.  If your dentist prescribed an antibiotic medicine to treat an infection, take it as told. Do not stop taking the antibiotic even if your condition gets better.  Keep all follow-up visits as told by your dentist. This is important. This includes all cleanings. Preventing dental caries  Brush your teeth every morning and night. Use fluoride toothpaste.  Get regular dental cleanings.  If you are at risk of dental caries. ? Wash your mouth with prescription mouthwash (chlorhexidine). ? Put topical fluoride on your teeth.  Drink water with fluoride in it.  Drink water instead of sugary drinks.  Eat healthy meals and snacks. Contact a doctor if:  You have symptoms of tooth decay. Summary  Dental caries are spots of decay (cavities) in teeth. They are in the outer layer of your tooth.  Take an antibiotic to treat an infection, if told by your dentist. Do not stop taking the antibiotic even if your condition gets better.  Regular dental cleanings and brushing can help prevent dental caries. This information is not intended to replace advice given to you by your health care provider. Make sure you discuss any questions you have with your health  care provider. Document Released: 05/14/2008 Document Revised: 04/21/2016 Document Reviewed: 04/21/2016 Elsevier Interactive Patient Education  2017 [employer].

## 2017-05-23 NOTE — Telephone Encounter (Signed)
Tyler Wilkinson called saying he is feeling bad, cannot eat and has pain and swelling in his mouth/face. Thinks he may have an abscess. triage nurse suggested an appointment in acute care clinic. He was given an appointment for this afternoon.

## 2017-05-26 NOTE — Progress Notes (Signed)
Internal Medicine Clinic Attending  I saw and evaluated the patient.  I personally confirmed the key portions of the history and exam documented by Dr. Saraiya and I reviewed pertinent patient test results.  The assessment, diagnosis, and plan were formulated together and I agree with the documentation in the resident's note.  

## 2017-05-26 NOTE — Assessment & Plan Note (Signed)
Patient is here for concern about a tooth abscess. He has not been able to follow up with his oral surgeon which is why he is here. He says that he has always had problems with his teeth and now he thinks there is an abscess. He was recently seen for a URI but says since then he has some swelling of the left side of the face,and some odynophagia. Denies any fevers or constitutional symptoms. On exam, he has some mild swelling of the left side of the face. Some lymphadenopathy on the left side. No abscess seen intraorally anywhere. Has poor dentition and caries.   A: While he does not have an abscess, we are concerned he may be developing a tooth infection, and so we will treat him with a 5 day course of Augmentin   P -Augmentin 5 days -follow up with dental surgeon

## 2017-05-26 NOTE — Progress Notes (Signed)
   CC: concern for tooth abscess HPI: [name] a 61 y.o. man with PMH noted below here for concern for tooth abscess  Please see Problem List/A&P for the status of the patient's chronic medical problems   Past Medical History:  Diagnosis Date  . Asthma   . CAD (coronary artery disease)    stent RCA 2006 (other residual disease). /  nuclear 2008  no ischemia, inferolateral scar.  . Cataract   . Chronic bronchitis (HCC)    "anytime I get sick it goes into bronchitis; probably get it q yr"  . Daily headache    "just recently" (05/10/2014)  . Dyslipidemia   . Ejection fraction    EF 55%,echo, 2008, inferolateral hypokinesis,  . Erectile dysfunction   . Gastroparesis   . Hypertension   . Myocardial infarction (HCC) 2006  . Severe obstructive sleep apnea-hypopnea syndrome 04/15/2009   Sleep study 03/15/2009 AHI of 82.2/hr oxygen desaturation nadir of 77%, loud snoring. Did not tolerate CPAP due to mask.  . Type 2 diabetes mellitus with complications (HCC) dx'd 1972    Review of Systems:  Constitutional: Negative for fever, chills, weight loss and malaise/fatigue.  HEENT: No headaches, cough, has some swelling of the left side of face. Some tender lymph nodes  Respiratory: Negative for cough, shortness of breath  Physical Exam: Vitals:   05/23/17 1607  BP: (!) 131/101  Pulse: 90  Temp: (!) 97.4 F (36.3 C)  TempSrc: Oral  SpO2: 100%  Weight: 214 lb 3.2 oz (97.2 kg)    General: A&O, in NAD HEENT: some mild swelling of the left side of the face. Some lymphadenopathy on the left side. No abscess seen intraorally anywhere. Has poor dentition and caries.  CV: RRR, normal s1, s2, no m/r/g Resp: equal and symmetric breath sounds, no wheezing or crackles  Abdomen: soft, nontender, nondistended, +BS   Assessment & Plan:   See encounters tab for problem based medical decision making. Patient seen with Dr. Heide Spark

## 2017-06-04 ENCOUNTER — Telehealth: Payer: Self-pay | Admitting: Dietician

## 2017-06-04 DIAGNOSIS — E1143 Type 2 diabetes mellitus with diabetic autonomic (poly)neuropathy: Secondary | ICD-10-CM

## 2017-06-04 MED ORDER — INSULIN ASPART 100 UNIT/ML ~~LOC~~ SOLN: SUBCUTANEOUS | 3 refills | Status: DC

## 2017-06-04 NOTE — Addendum Note (Signed)
Addended by: Mliss FritzKIM, Caniyah Murley J on: 06/04/2017 05:44 PM   Modules accepted: Orders

## 2017-06-04 NOTE — Telephone Encounter (Signed)
Patient called for assistance with cost of Novolog insulin and test strips. Faxed coupons to pharmacy- [facility] on [address] per his request. Requested  Pharmacy assistance.

## 2017-06-04 NOTE — Progress Notes (Signed)
Prescription re-sent with copay card

## 2017-06-24 ENCOUNTER — Telehealth: Payer: Self-pay

## 2017-06-24 NOTE — Telephone Encounter (Signed)
Requesting sleep study results.

## 2017-06-25 NOTE — Telephone Encounter (Signed)
Ok thank you 

## 2017-06-25 NOTE — Telephone Encounter (Signed)
[name] calls back saying he can come tomorrow and he got a ride ( no car at this time) . I encouraged him to talk to Dr. Mikey BussingHoffman about his situation with appointment times.

## 2017-06-25 NOTE — Telephone Encounter (Signed)
Left a message for patient to cancel tomorrow's appointment if he cannot make it as early as possible. (He is not sure he can get off from his jobs)  Also that he can reschedule if needed per [facility] Dr. Mikey BussingHoffman is attending to get his sleep study results.

## 2017-06-26 ENCOUNTER — Ambulatory Visit: Payer: BLUE CROSS/BLUE SHIELD | Admitting: Internal Medicine

## 2017-06-30 ENCOUNTER — Encounter: Payer: Self-pay | Admitting: Internal Medicine

## 2017-06-30 ENCOUNTER — Other Ambulatory Visit: Payer: Self-pay

## 2017-06-30 ENCOUNTER — Ambulatory Visit (INDEPENDENT_AMBULATORY_CARE_PROVIDER_SITE_OTHER): Payer: BLUE CROSS/BLUE SHIELD | Admitting: Internal Medicine

## 2017-06-30 ENCOUNTER — Encounter (INDEPENDENT_AMBULATORY_CARE_PROVIDER_SITE_OTHER): Payer: Self-pay

## 2017-06-30 VITALS — BP 155/71 | HR 81 | Temp 97.6°F | Ht 72.5 in | Wt 214.6 lb

## 2017-06-30 DIAGNOSIS — Z9641 Presence of insulin pump (external) (internal): Secondary | ICD-10-CM

## 2017-06-30 DIAGNOSIS — E785 Hyperlipidemia, unspecified: Secondary | ICD-10-CM

## 2017-06-30 DIAGNOSIS — I252 Old myocardial infarction: Secondary | ICD-10-CM | POA: Diagnosis not present

## 2017-06-30 DIAGNOSIS — I2510 Atherosclerotic heart disease of native coronary artery without angina pectoris: Secondary | ICD-10-CM

## 2017-06-30 DIAGNOSIS — G4733 Obstructive sleep apnea (adult) (pediatric): Secondary | ICD-10-CM

## 2017-06-30 DIAGNOSIS — Z794 Long term (current) use of insulin: Secondary | ICD-10-CM

## 2017-06-30 DIAGNOSIS — E1143 Type 2 diabetes mellitus with diabetic autonomic (poly)neuropathy: Secondary | ICD-10-CM

## 2017-06-30 DIAGNOSIS — I10 Essential (primary) hypertension: Secondary | ICD-10-CM

## 2017-06-30 LAB — POCT GLYCOSYLATED HEMOGLOBIN (HGB A1C): Hemoglobin A1C: 7.8

## 2017-06-30 LAB — GLUCOSE, CAPILLARY: Glucose-Capillary: 190 mg/dL — ABNORMAL HIGH (ref 65–99)

## 2017-06-30 MED ORDER — CARVEDILOL 6.25 MG PO TABS: 6.2500 mg | ORAL_TABLET | Freq: Two times a day (BID) | ORAL | 1 refills | Status: DC

## 2017-06-30 MED ORDER — HYDROCHLOROTHIAZIDE 25 MG PO TABS: 25.0000 mg | ORAL_TABLET | Freq: Every day | ORAL | 3 refills | Status: DC

## 2017-06-30 MED ORDER — BENAZEPRIL HCL 40 MG PO TABS: 40.0000 mg | ORAL_TABLET | Freq: Every day | ORAL | 1 refills | Status: DC

## 2017-06-30 MED ORDER — INSULIN ASPART 100 UNIT/ML ~~LOC~~ SOLN: SUBCUTANEOUS | 3 refills | Status: DC

## 2017-06-30 MED ORDER — ROSUVASTATIN CALCIUM 5 MG PO TABS
5.0000 mg | ORAL_TABLET | Freq: Every day | ORAL | 3 refills | Status: DC
Start: 2017-06-30 — End: 2018-10-15

## 2017-06-30 NOTE — Assessment & Plan Note (Addendum)
Assessment: Patient presents today for results of his sleep study  Plan: Needs CPAP machine again 05/16/17- at 14cm H2O Placed order for DME CPAP

## 2017-06-30 NOTE — Assessment & Plan Note (Addendum)
Assessment: Last HgbA1c >8 No recent screening  Patient compliant with V-GO insulin pump.   Plan: Repeat A1c today and adjust insulin as indicated Foot exam  ABI's today Instructed patient to schedule with Ophthalmology for eye exam.   HgbA1c improved to 7.8 POC glucose 190

## 2017-06-30 NOTE — Patient Instructions (Addendum)
Given the severity of your obstructive sleep apnea, continue use of a CPAP machine is advised to decrease the progression of lung disease and high blood pressure. In addition, the use of the CPAP will significantly improve your daily performance granted it improves your sleep. Your compliance with the machine and continue using as directed to better permit improvement in your symptoms.   [facility].

## 2017-06-30 NOTE — Progress Notes (Signed)
   CC: fatigue, not feeling well overall  HPI:  [name] a 61 y.o. male who presented today for follow-up regarding symptoms related to OSA. He had completed a sleep study is here to follow-up on the results of that test, further indications of the results and for continued treatment of his blood pressure.  Attested to; chronic fatigue, daytime sleepiness, and daily headaches.  Denied fever, chills, night sweats, abdominal pain, diarrhea, constipation, palpitations, chest pain, leg pain, cough, or vomiting.  Past Medical History:  Diagnosis Date  . Asthma   . CAD (coronary artery disease)    stent RCA 2006 (other residual disease). /  nuclear 2008  no ischemia, inferolateral scar.  . Cataract   . Chronic bronchitis (HCC)    "anytime I get sick it goes into bronchitis; probably get it q yr"  . Daily headache    "just recently" (05/10/2014)  . Dyslipidemia   . Ejection fraction    EF 55%,echo, 2008, inferolateral hypokinesis,  . Erectile dysfunction   . Gastroparesis   . Hypertension   . Myocardial infarction (HCC) 2006  . Severe obstructive sleep apnea-hypopnea syndrome 04/15/2009   Sleep study 03/15/2009 AHI of 82.2/hr oxygen desaturation nadir of 77%, loud snoring. Did not tolerate CPAP due to mask.  . Type 2 diabetes mellitus with complications (HCC) dx'd 1972   Review of Systems:  ROS negative except as per HPI.  Physical Exam:  Vitals:   06/30/17 1327  BP: (!) 155/71  Pulse: 81  Temp: 97.6 F (36.4 C)  TempSrc: Oral  SpO2: 99%  Weight: 214 lb 9.6 oz (97.3 kg)  Height: 6' 0.5" (1.842 m)   Physical Exam  Constitutional: He appears well-developed and well-nourished. No distress.  Cardiovascular: Normal rate and regular rhythm.  No murmur heard. Pulmonary/Chest: Effort normal. He has wheezes (Inspiratory ) in the right lower field and the left lower field.  Abdominal: Soft. Bowel sounds are normal. He exhibits no distension.  Psychiatric: He has a normal  mood and affect.  Vitals reviewed.   Assessment & Plan:   See Encounters Tab for problem based charting.  Patient seen with Dr. Cleda DaubE. Hoffman

## 2017-06-30 NOTE — Progress Notes (Signed)
Internal Medicine Clinic Attending  I saw and evaluated the patient.  I personally confirmed the key portions of the history and exam documented by Dr. Crista ElliotHarbrecht and I reviewed pertinent patient test results.  The assessment, diagnosis, and plan were formulated together and I agree with the documentation in the resident's note. Patient complaints that wound on left lower extremity (shin) has taken weeks to heal.  On exam he has palpable pulses of DP and PT bilaterally but left leg slightly cooler to touch, decreased hair growth on lower 1/3 of legs bilaterally. We are obtaining ABIs in clinic to assess for PAD. He is interested in going back on CPAP for his severe OSA, reports most recent sleep study more tolerable.

## 2017-06-30 NOTE — Assessment & Plan Note (Signed)
Assessment: Moderately well controlled today. Patient is compliant on medications. Stated he missed a dose of meds this morning.  Plan: Continue current regimen at this visit. Reassess on his next visit and increased meds as indicated at that time.

## 2017-07-01 ENCOUNTER — Telehealth: Payer: Self-pay

## 2017-07-01 LAB — BMP8+ANION GAP
ANION GAP: 16 mmol/L (ref 10.0–18.0)
BUN / CREAT RATIO: 14 (ref 10–24)
BUN: 15 mg/dL (ref 8–27)
CALCIUM: 9.6 mg/dL (ref 8.6–10.2)
CO2: 23 mmol/L (ref 20–29)
CREATININE: 1.04 mg/dL (ref 0.76–1.27)
Chloride: 100 mmol/L (ref 96–106)
GFR calc Af Amer: 89 mL/min/{1.73_m2} (ref >59)
GFR calc non Af Amer: 77 mL/min/{1.73_m2} (ref >59)
Glucose: 217 mg/dL — ABNORMAL HIGH (ref 65–99)
Potassium: 4.1 mmol/L (ref 3.5–5.2)
Sodium: 139 mmol/L (ref 134–144)

## 2017-07-01 NOTE — Telephone Encounter (Signed)
Requesting lab results. Please call pt back.  

## 2017-07-02 ENCOUNTER — Other Ambulatory Visit: Payer: Self-pay | Admitting: Internal Medicine

## 2017-07-02 DIAGNOSIS — E1143 Type 2 diabetes mellitus with diabetic autonomic (poly)neuropathy: Secondary | ICD-10-CM

## 2017-07-02 MED ORDER — ALPRAZOLAM 0.5 MG PO TABS: 0.5000 mg | ORAL_TABLET | Freq: Every evening | ORAL | 2 refills | Status: DC | PRN

## 2017-07-02 MED ORDER — GLUCOSE BLOOD VI STRP: ORAL_STRIP | 5 refills | Status: DC

## 2017-07-02 NOTE — Progress Notes (Signed)
Called to pharm but pt had 2 refills on file, they put these on file

## 2017-07-02 NOTE — Telephone Encounter (Signed)
Dr Mikey Bussinghoffman has addressed this

## 2017-07-02 NOTE — Progress Notes (Signed)
Called Tyler Wilkinson about his labwork.  He requested smaller refill for his test strips, sent in this order, he would also like a refill of Xanax.

## 2017-07-16 ENCOUNTER — Other Ambulatory Visit: Payer: Self-pay | Admitting: Dietician

## 2017-07-16 ENCOUNTER — Encounter: Payer: Self-pay | Admitting: Dietician

## 2017-07-16 MED ORDER — FREESTYLE LIBRE 14 DAY READER DEVI: 1.0000 | Freq: Every day | 0 refills | Status: DC

## 2017-07-16 MED ORDER — FREESTYLE LIBRE 14 DAY SENSOR MISC: 1.0000 | Freq: Every day | 12 refills | Status: DC

## 2017-07-16 NOTE — Telephone Encounter (Signed)
Rescheduled friday appointment to have his teeth taken out, would like to have a prescription for the freestyle libre.

## 2017-07-18 ENCOUNTER — Ambulatory Visit: Payer: BLUE CROSS/BLUE SHIELD | Admitting: Dietician

## 2017-07-24 ENCOUNTER — Telehealth: Payer: Self-pay | Admitting: Dietician

## 2017-07-24 DIAGNOSIS — R350 Frequency of micturition: Secondary | ICD-10-CM

## 2017-07-24 NOTE — Addendum Note (Signed)
Addended by: Carlynn PurlHOFFMAN, Quantina Dershem C on: 07/24/2017 05:04 PM   Modules accepted: Orders

## 2017-07-24 NOTE — Telephone Encounter (Signed)
Wants me to Remind him of his appointment tomorrow. Also wants me to  Mention to Dr. Mikey BussingHoffman his concern about:  Peeing more than usual, has been going on for 3-4 weeks ever since he got sick with teeth.  "Constantly peeing, up 4 times a night, a whole lot more peeing than usual". He forgot to mention it the other day.  Blood sugars not higher than usual.   I told him I would send his request to Dr. Mikey BussingHoffman and our triage nurse. He has an appointment with me at 3 Pm tomorrow.

## 2017-07-24 NOTE — Telephone Encounter (Signed)
Thank you donna, I placed an order for a urinalysis if you could have him provide a urine sample when he is here tommorrow.

## 2017-07-25 ENCOUNTER — Ambulatory Visit (INDEPENDENT_AMBULATORY_CARE_PROVIDER_SITE_OTHER): Payer: BLUE CROSS/BLUE SHIELD | Admitting: Dietician

## 2017-07-25 ENCOUNTER — Other Ambulatory Visit: Payer: BLUE CROSS/BLUE SHIELD

## 2017-07-25 DIAGNOSIS — Z794 Long term (current) use of insulin: Secondary | ICD-10-CM

## 2017-07-25 DIAGNOSIS — E119 Type 2 diabetes mellitus without complications: Secondary | ICD-10-CM

## 2017-07-25 DIAGNOSIS — R350 Frequency of micturition: Secondary | ICD-10-CM

## 2017-07-25 DIAGNOSIS — Z713 Dietary counseling and surveillance: Secondary | ICD-10-CM

## 2017-07-25 NOTE — Patient Instructions (Signed)
Please reset your [employer] for care password to get the coupon for your NOVOLOG.  You can call me Tuesday and we can reset your password over the phone and then I can get the Novolog coupon and fax it to your pharmacy.    The [employer] Libre reader cost at [facility] is $64.99. You only have to buy that one time.  The Freestyle Libre sensors( you get two) cost you $27.47 per month.

## 2017-07-25 NOTE — Progress Notes (Signed)
  PDiabetes Self-Management Education  Visit Type:  (P) Follow-up  Appt. Start Time: 1515 Appt. End Time: 1545  07/25/2017  [name], identified by name and date of birth, is a 61 y.o. male with a diagnosis of Diabetes: (P) Type 2.   ASSESSMENT rovided patient with new meter per his request. Educated patient about CGM and called pharmacy to help patient with knowing cost of Freestyle [place] personal.   CGM today was 101 mg/dl after half and half sweet tea and a walk. He is requesting assistance with cost of insulin. Unable to obtain a copay card today for his Novolog today. He is to call back next week when he has his password or can access his smartphone.  suggested appointment schedule to see his PCP at leat every 6 months.     Diabetes Self-Management Education - 07/25/17 1700      Health Coping   How would you rate your overall health?  Fair  (Pended)       Pre-Education Assessment   Patient understands monitoring blood glucose, interpreting and using results  Needs Instruction  (Pended)       Complications   Have you had a dilated eye exam in the past 12 months?  Yes  (Pended)     Have you had a dental exam in the past 12 months?  Yes  (Pended)     Are you checking your feet?  Yes  (Pended)       Subsequent Visit   Since your last visit have you had your blood pressure checked?  No  (Pended)     Since your last visit have you experienced any weight changes?  No change  (Pended)     Since your last visit, are you checking your blood glucose at least once a day?  Yes  (Pended)        Learning Objective:  Patient will have a greater understanding of diabetes self-management. Patient education plan is to attend individual and/or group sessions per assessed needs and concerns.   Plan:   Patient Instructions  Please reset your [employer] for care password to get the coupon for your NOVOLOG.  You can call me Tuesday and we can reset your password over the phone and  then I can get the Novolog coupon and fax it to your pharmacy.    The [employer] Libre reader cost at [employer] is $64.99. You only have to buy that one time.  The Freestyle Libre sensors( you get two) cost you $27.47 per month.     Expected Outcomes:     Education material provided: avs  If problems or questions, patient to contact team via:  Phone  Future DSME appointment: -    6 months Plyler, Tyler Wilkinson, RD 07/25/2017 5:19 PM.

## 2017-07-25 NOTE — Telephone Encounter (Signed)
Thank you! Noted and discussed with patient who called to acknowledge his appointment and continued issues with urinating.

## 2017-07-26 LAB — URINALYSIS, ROUTINE W REFLEX MICROSCOPIC
BILIRUBIN UA: NEGATIVE
Glucose, UA: NEGATIVE
Ketones, UA: NEGATIVE
Leukocytes, UA: NEGATIVE
NITRITE UA: NEGATIVE
PH UA: 5 (ref 5.0–7.5)
Protein, UA: NEGATIVE
RBC UA: NEGATIVE
Specific Gravity, UA: 1.005 (ref 1.005–1.030)
UUROB: 0.2 mg/dL (ref 0.2–1.0)

## 2017-07-28 NOTE — Telephone Encounter (Signed)
U/A completely normal, I called and informed him of this,  I am probably most concerned for overactive bladder versus a prostate issue.  Could we schedule him for a visit in the next week or two, if my clinic is full he [facility] frequency.

## 2017-08-04 NOTE — Telephone Encounter (Signed)
Note that he has an appointment scheduled for 08/14/17

## 2017-08-14 ENCOUNTER — Ambulatory Visit: Payer: BLUE CROSS/BLUE SHIELD

## 2017-08-14 ENCOUNTER — Encounter: Payer: Self-pay | Admitting: Internal Medicine

## 2017-08-20 ENCOUNTER — Encounter: Payer: Self-pay | Admitting: Dietician

## 2017-08-20 ENCOUNTER — Telehealth: Payer: Self-pay | Admitting: Dietician

## 2017-08-20 DIAGNOSIS — E1143 Type 2 diabetes mellitus with diabetic autonomic (poly)neuropathy: Secondary | ICD-10-CM

## 2017-08-20 NOTE — Telephone Encounter (Signed)
Running out of Novolog and cannot afford the 70$ copay.  Called pharmacy- with coupon his Novolog is 50$ for a 45 day supply.  Patient informed.

## 2017-08-20 NOTE — Telephone Encounter (Signed)
Patient asked about order for his CPAP machine. He seems to be having trouble obtaining one. Referred him to our medical records personal on where/how to get CPAP. Informed medical records about his concerns.

## 2017-09-19 ENCOUNTER — Telehealth: Payer: Self-pay | Admitting: Dietician

## 2017-09-19 NOTE — Telephone Encounter (Signed)
He already has a copay card for his VGO.  He confirms hs appointment with Dr. Mikey BussingHoffman on 10/02/17.  He wants to talk to Dr. Mikey BussingHoffman a different cpap machine. He also asks about training for his Freestyle [place] once he picks it up.

## 2017-09-19 NOTE — Telephone Encounter (Signed)
Opened in error

## 2017-10-02 ENCOUNTER — Encounter (INDEPENDENT_AMBULATORY_CARE_PROVIDER_SITE_OTHER): Payer: Self-pay

## 2017-10-02 ENCOUNTER — Ambulatory Visit (INDEPENDENT_AMBULATORY_CARE_PROVIDER_SITE_OTHER): Payer: BLUE CROSS/BLUE SHIELD | Admitting: Dietician

## 2017-10-02 ENCOUNTER — Other Ambulatory Visit: Payer: Self-pay | Admitting: Pharmacist

## 2017-10-02 ENCOUNTER — Encounter: Payer: BLUE CROSS/BLUE SHIELD | Admitting: Internal Medicine

## 2017-10-02 ENCOUNTER — Encounter: Payer: Self-pay | Admitting: Dietician

## 2017-10-02 DIAGNOSIS — Z713 Dietary counseling and surveillance: Secondary | ICD-10-CM

## 2017-10-02 DIAGNOSIS — Z794 Long term (current) use of insulin: Secondary | ICD-10-CM

## 2017-10-02 DIAGNOSIS — E119 Type 2 diabetes mellitus without complications: Secondary | ICD-10-CM | POA: Diagnosis not present

## 2017-10-02 DIAGNOSIS — E1143 Type 2 diabetes mellitus with diabetic autonomic (poly)neuropathy: Secondary | ICD-10-CM

## 2017-10-02 MED ORDER — INSULIN LISPRO 100 UNIT/ML ~~LOC~~ SOLN: SUBCUTANEOUS | 3 refills | Status: DC

## 2017-10-02 NOTE — Progress Notes (Signed)
Formulary interchange from Novolog to Humalog

## 2017-10-02 NOTE — Progress Notes (Signed)
  Diabetes Self-Management Education  Visit Type:     Appt. Start Time: 1100 Appt. End Time: 1130  10/02/2017  [name], identified by name and date of birth, is a 62 y.o. male with a diagnosis of Diabetes:  .   ASSESSMENT Provided patient with education about his new personal Freestyle libre CGM. He demonstrated understanding by putting a sensor on the back of his arm, starting it and scanning it. He was educated about needing to scan it at least 1x every 8 hours.  His blood sugar today is 143 fasting.  He was also educated about how an OmniPOd insulin pump works per his request. He is requesting assistance with cost of insulin. Will ask pharmacy if  Switching to Humalog would be less expensive.    Learning Objective:  Patient will have a greater understanding of diabetes self-management. Patient education plan is to attend individual and/or group sessions per assessed needs and concerns.  Plan:   There are no Patient Instructions on file for this visit.  Expected Outcomes:   improved blood sugars and   Education material provided: avs  If problems or questions, patient to contact team via:  Phone  Future DSME appointment: -    6 months Norm ParcelDonna Florentine Diekman, RD 10/02/2017 2:04 PM.  Addendum: [name] calls saying his Freestyle Tyler Wilkinson is working and he checked his blood sugar several time: 120/114- took 3 clicks and ate-190

## 2017-10-08 ENCOUNTER — Telehealth: Payer: Self-pay | Admitting: Dietician

## 2017-10-08 NOTE — Telephone Encounter (Signed)
Patient calls to report a severe low blood sugar after changing his vgo:  [phone] am- 227 straight arrow to the side, put new vgo on 840 am- "lo" 906 am --"lo"- treated low 927am -48 940 am -63 Now- 84- arrow up at 45 degrees-rising  Average 154 for past 7 days. Says he checks his sugar about 30 times a day now. He agrees to keep blood sugar 100-200 the rest of today and to remove the old vgo when he puts a new one on. Change vgo at a time when he will be awake afterwards such as during the day.

## 2017-10-15 NOTE — Progress Notes (Signed)
Subjective:  HPI: [name] a 62 y.o. male who presents for DM follow up  Please see Assessment and Plan below for the status of his chronic medical problems.  Review of Systems: Review of Systems  Constitutional: Positive for malaise/fatigue. Negative for weight loss.  Respiratory: Negative for cough and shortness of breath.   Cardiovascular: Negative for chest pain.  Gastrointestinal: Negative for abdominal pain.  Genitourinary: Negative for dysuria and frequency.  Psychiatric/Behavioral: Positive for depression. Negative for hallucinations, substance abuse and suicidal ideas. The patient is nervous/anxious and has insomnia.     Objective:  Physical Exam: Vitals:   10/16/17 1022  BP: 122/67  Pulse: 74  Temp: 97.6 F (36.4 C)  TempSrc: Oral  SpO2: 99%  Weight: 211 lb 6.4 oz (95.9 kg)  Height: 6\' 5"  (1.956 m)   Physical Exam  Constitutional: He is well-developed, well-nourished, and in no distress.  Cardiovascular: Normal rate and regular rhythm.  Pulmonary/Chest: Effort normal and breath sounds normal.  Abdominal: Soft. Bowel sounds are normal.  Psychiatric: His mood appears anxious. He exhibits a depressed mood. He expresses no homicidal and no suicidal ideation.  Nursing note and vitals reviewed.  Assessment & Plan:  Essential hypertension History of present illness: Currently taking hydrochlorothiazide 25 mg, carvedilol 6.25 mg twice a day and benazepril 40 mg daily he has no complaints.  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 CGM downloaded Marti SleighDavid W [place] wore the CGM for 14 days. The average reading was 171, % time in target was 54, % time below target was 5, and % time above target was. 41.  He reports increased depression, eating one meal a  day, low blood sugars were due to not eating.  Understands importance of more frequent smaller meals.  No polyuria or polydispia.  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 Starting to impact his work, not wanting to talk with coworkers.  Worries about loosing his job.  Feels Wellbutrin was helping, he is not sure why he did not continue to fill this medication, is willing to go to therapy.  No SI or HI.  A: MDD  P:Restart wellbutrin Referral for psychology. Continue xanax 0.5mg  QHS prn for insomnia/ anxiety   Medications Ordered Meds ordered this encounter  Medications  . buPROPion (WELLBUTRIN XL) 150 MG 24 hr tablet    Sig: Take 1 pill once a day for 3 days then start taking twice daily.    Dispense:  60 tablet    Refill:  11  . ALPRAZolam (XANAX) 0.5 MG tablet    Sig: Take 1 tablet (0.5 mg total) by mouth at bedtime as needed for anxiety or sleep.    Dispense:  30 tablet    Refill:  2   Other Orders Orders Placed This Encounter  Procedures  . Glucose, capillary  . Ambulatory referral to Social Work    Referral Priority:   Routine    Referral Type:   Consultation    Referral Reason:   Specialty Services Required    Number of Visits Requested:   1  . POC Hbg A1C   Follow Up: Return in about 3 months (around 01/13/2018), or if symptoms worsen or  fail to improve.

## 2017-10-16 ENCOUNTER — Ambulatory Visit (INDEPENDENT_AMBULATORY_CARE_PROVIDER_SITE_OTHER): Payer: BLUE CROSS/BLUE SHIELD | Admitting: Internal Medicine

## 2017-10-16 ENCOUNTER — Other Ambulatory Visit: Payer: Self-pay

## 2017-10-16 ENCOUNTER — Encounter: Payer: Self-pay | Admitting: Internal Medicine

## 2017-10-16 VITALS — BP 122/67 | HR 74 | Temp 97.6°F | Ht 77.0 in | Wt 211.4 lb

## 2017-10-16 DIAGNOSIS — Z79899 Other long term (current) drug therapy: Secondary | ICD-10-CM

## 2017-10-16 DIAGNOSIS — E1143 Type 2 diabetes mellitus with diabetic autonomic (poly)neuropathy: Secondary | ICD-10-CM

## 2017-10-16 DIAGNOSIS — K3184 Gastroparesis: Secondary | ICD-10-CM | POA: Diagnosis not present

## 2017-10-16 DIAGNOSIS — F339 Major depressive disorder, recurrent, unspecified: Secondary | ICD-10-CM

## 2017-10-16 DIAGNOSIS — I10 Essential (primary) hypertension: Secondary | ICD-10-CM

## 2017-10-16 DIAGNOSIS — F419 Anxiety disorder, unspecified: Secondary | ICD-10-CM | POA: Diagnosis not present

## 2017-10-16 DIAGNOSIS — G4700 Insomnia, unspecified: Secondary | ICD-10-CM | POA: Diagnosis not present

## 2017-10-16 DIAGNOSIS — F331 Major depressive disorder, recurrent, moderate: Secondary | ICD-10-CM | POA: Diagnosis not present

## 2017-10-16 LAB — POCT GLYCOSYLATED HEMOGLOBIN (HGB A1C): Hemoglobin A1C: 7.9

## 2017-10-16 LAB — GLUCOSE, CAPILLARY: GLUCOSE-CAPILLARY: 167 mg/dL — AB (ref 65–99)

## 2017-10-16 MED ORDER — BUPROPION HCL ER (XL) 150 MG PO TB24: ORAL_TABLET | ORAL | 11 refills | Status: DC

## 2017-10-16 MED ORDER — ALPRAZOLAM 0.5 MG PO TABS: 0.5000 mg | ORAL_TABLET | Freq: Every evening | ORAL | 2 refills | Status: DC | PRN

## 2017-10-16 NOTE — Assessment & Plan Note (Addendum)
HPI: Using V-GO40 and Freesytle libre CGM. CGM downloaded Tyler SleighDavid W Wilkinson wore the CGM for 14 days. The average reading was 171, % time in target was 54, % time below target was 5, and % time above target was. 41.  He reports increased depression, eating one meal a day, low blood sugars were due to not eating.  Understands importance of more frequent smaller meals.  No polyuria or polydispia.  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

## 2017-10-16 NOTE — Assessment & Plan Note (Addendum)
HPI: Notes increased depression, insomnia, anhedonia, anorexia. Starting to impact his work, not wanting to talk with coworkers.  Worries about loosing his job.  Feels Wellbutrin was helping, he is not sure why he did not continue to fill this medication, is willing to go to therapy.  No SI or HI.  A: MDD  P:Restart wellbutrin Referral for psychology. Continue xanax 0.5mg  QHS prn for insomnia/ anxiety

## 2017-10-16 NOTE — Assessment & Plan Note (Signed)
History of present illness: Currently taking hydrochlorothiazide 25 mg, carvedilol 6.25 mg twice a day and benazepril 40 mg daily he has no complaints.  Assessment essential hypertension well controlled  Plan Continue HCTZ 25 mg daily, carvedilol 6.25 mg twice a day, 40 mg of benazepril daily

## 2017-10-30 DIAGNOSIS — Z23 Encounter for immunization: Secondary | ICD-10-CM | POA: Diagnosis not present

## 2017-11-03 DIAGNOSIS — H40053 Ocular hypertension, bilateral: Secondary | ICD-10-CM | POA: Diagnosis not present

## 2017-11-03 DIAGNOSIS — E119 Type 2 diabetes mellitus without complications: Secondary | ICD-10-CM | POA: Diagnosis not present

## 2017-11-03 DIAGNOSIS — Z961 Presence of intraocular lens: Secondary | ICD-10-CM | POA: Diagnosis not present

## 2017-11-03 LAB — HM DIABETES EYE EXAM

## 2017-11-07 DIAGNOSIS — G4733 Obstructive sleep apnea (adult) (pediatric): Secondary | ICD-10-CM | POA: Diagnosis not present

## 2017-11-25 ENCOUNTER — Encounter: Payer: Self-pay | Admitting: *Deleted

## 2017-12-08 DIAGNOSIS — G4733 Obstructive sleep apnea (adult) (pediatric): Secondary | ICD-10-CM | POA: Diagnosis not present

## 2017-12-23 DIAGNOSIS — G4733 Obstructive sleep apnea (adult) (pediatric): Secondary | ICD-10-CM | POA: Diagnosis not present

## 2017-12-31 ENCOUNTER — Other Ambulatory Visit: Payer: Self-pay | Admitting: Dietician

## 2017-12-31 DIAGNOSIS — E1143 Type 2 diabetes mellitus with diabetic autonomic (poly)neuropathy: Secondary | ICD-10-CM

## 2017-12-31 MED ORDER — V-GO 40 KIT: 1.0000 [IU] | PACK | Freq: Every morning | 11 refills | Status: DC

## 2018-01-07 DIAGNOSIS — G4733 Obstructive sleep apnea (adult) (pediatric): Secondary | ICD-10-CM | POA: Diagnosis not present

## 2018-01-13 ENCOUNTER — Other Ambulatory Visit: Payer: Self-pay | Admitting: Dietician

## 2018-01-13 NOTE — Telephone Encounter (Signed)
Calling about no refills x 4 months on statin per Dr. Mikey Bussing.

## 2018-01-20 ENCOUNTER — Encounter: Payer: Self-pay | Admitting: Dietician

## 2018-02-07 DIAGNOSIS — G4733 Obstructive sleep apnea (adult) (pediatric): Secondary | ICD-10-CM | POA: Diagnosis not present

## 2018-02-16 ENCOUNTER — Other Ambulatory Visit: Payer: Self-pay | Admitting: Internal Medicine

## 2018-02-16 DIAGNOSIS — I10 Essential (primary) hypertension: Secondary | ICD-10-CM

## 2018-03-05 ENCOUNTER — Other Ambulatory Visit: Payer: Self-pay | Admitting: *Deleted

## 2018-03-05 MED ORDER — ALPRAZOLAM 0.5 MG PO TABS: 0.5000 mg | ORAL_TABLET | Freq: Every evening | ORAL | 1 refills | Status: DC | PRN

## 2018-03-05 NOTE — Telephone Encounter (Signed)
Next appt scheduled  8/8/ with PCP. 

## 2018-03-09 DIAGNOSIS — G4733 Obstructive sleep apnea (adult) (pediatric): Secondary | ICD-10-CM | POA: Diagnosis not present

## 2018-03-20 ENCOUNTER — Telehealth: Payer: Self-pay | Admitting: Dietician

## 2018-03-20 NOTE — Telephone Encounter (Signed)
[name] returned call from late May when I had called him per Dr. Mikey BussingHoffman about why he has not refilled it. He doesn't take statin, has read about nitrous oxide and takes L-argarine instead. He says he has spoken to his cardiologist about this. He feels his blood pressure and cholesterol have been better since he started it. He increased the L-arginine to try to increase his HDL. He says he will talk to Dr. Mikey BussingHoffman.  Confirmed appointment on 8./8/19 with Dr. Mikey BussingHoffman.

## 2018-03-26 ENCOUNTER — Ambulatory Visit: Payer: BLUE CROSS/BLUE SHIELD | Admitting: Internal Medicine

## 2018-03-26 ENCOUNTER — Ambulatory Visit: Payer: BLUE CROSS/BLUE SHIELD | Admitting: Dietician

## 2018-03-26 ENCOUNTER — Other Ambulatory Visit: Payer: Self-pay

## 2018-03-26 ENCOUNTER — Ambulatory Visit (INDEPENDENT_AMBULATORY_CARE_PROVIDER_SITE_OTHER): Payer: BLUE CROSS/BLUE SHIELD | Admitting: Internal Medicine

## 2018-03-26 ENCOUNTER — Encounter: Payer: Self-pay | Admitting: Dietician

## 2018-03-26 ENCOUNTER — Encounter: Payer: Self-pay | Admitting: Internal Medicine

## 2018-03-26 VITALS — BP 122/60 | HR 75 | Temp 97.4°F | Ht 77.0 in | Wt 213.8 lb

## 2018-03-26 DIAGNOSIS — E1143 Type 2 diabetes mellitus with diabetic autonomic (poly)neuropathy: Secondary | ICD-10-CM | POA: Diagnosis not present

## 2018-03-26 DIAGNOSIS — E11649 Type 2 diabetes mellitus with hypoglycemia without coma: Secondary | ICD-10-CM | POA: Diagnosis not present

## 2018-03-26 DIAGNOSIS — F419 Anxiety disorder, unspecified: Secondary | ICD-10-CM

## 2018-03-26 DIAGNOSIS — I10 Essential (primary) hypertension: Secondary | ICD-10-CM | POA: Diagnosis not present

## 2018-03-26 DIAGNOSIS — Z79899 Other long term (current) drug therapy: Secondary | ICD-10-CM

## 2018-03-26 DIAGNOSIS — F339 Major depressive disorder, recurrent, unspecified: Secondary | ICD-10-CM

## 2018-03-26 DIAGNOSIS — N529 Male erectile dysfunction, unspecified: Secondary | ICD-10-CM

## 2018-03-26 DIAGNOSIS — K3184 Gastroparesis: Secondary | ICD-10-CM

## 2018-03-26 DIAGNOSIS — Z794 Long term (current) use of insulin: Secondary | ICD-10-CM

## 2018-03-26 LAB — POCT GLYCOSYLATED HEMOGLOBIN (HGB A1C): HEMOGLOBIN A1C: 7.9 % — AB (ref 4.0–5.6)

## 2018-03-26 LAB — GLUCOSE, CAPILLARY: GLUCOSE-CAPILLARY: 153 mg/dL — AB (ref 70–99)

## 2018-03-26 MED ORDER — SILDENAFIL CITRATE 100 MG PO TABS: 100.0000 mg | ORAL_TABLET | ORAL | 5 refills | Status: DC | PRN

## 2018-03-26 NOTE — Progress Notes (Signed)
Continuous glucose monitoring Documentation:  Co-visit with [name] and Dr. Mikey BussingHoffman. Assisted with downloading [name]'s personal Continuous glucose monitor. Educated him on how to interpret the report, about how insulin pumps work, the pros and cons per Dr. Neita GarnetHoffman's request. Happy to assist with insurance investigation per patient preference.  Norm Parcelonna Jessika Rothery, RD 03/26/2018 4:44 PM.

## 2018-03-26 NOTE — Patient Instructions (Signed)
I want you to be careful about repeating "clicks", I want you to look into the Omnipod.

## 2018-03-30 ENCOUNTER — Telehealth: Payer: Self-pay | Admitting: Dietician

## 2018-03-30 NOTE — Telephone Encounter (Signed)
Wants to know what his lab work showed. Wanted to know what lipid profile was. I informed him the lipid profile was not done/ordered. Tyler Wilkinson says he intends to make an appointment with cardiology.

## 2018-03-31 NOTE — Progress Notes (Signed)
Subjective:  HPI: [name] a 62 y.o. male who presents for DM follow up  Please see Assessment and Plan below for the status of his chronic medical problems.  Review of Systems: Review of Systems  Constitutional: Negative for malaise/fatigue and weight loss.  Respiratory: Negative for cough and shortness of breath.   Cardiovascular: Negative for chest pain.  Gastrointestinal: Negative for abdominal pain.  Genitourinary: Negative for dysuria and frequency.  Psychiatric/Behavioral: Positive for depression. Negative for hallucinations, substance abuse and suicidal ideas. The patient is nervous/anxious.     Objective:  Physical Exam: Vitals:   03/26/18 1553 03/26/18 1709  BP: (!) 146/51 122/60  Pulse: 75   Temp: (!) 97.4 F (36.3 C)   TempSrc: Oral   SpO2: 98%   Weight: 213 lb 12.8 oz (97 kg)   Height: 6\' 5"  (1.956 m)    Physical Exam  Constitutional: He is well-developed, well-nourished, and in no distress.  Cardiovascular: Normal rate and regular rhythm.  Pulmonary/Chest: Effort normal and breath sounds normal.  Abdominal: Soft. Bowel sounds are normal.  Psychiatric: He exhibits a depressed mood. He expresses no homicidal and no suicidal ideation.  Nursing note and vitals reviewed.  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 CGM downloaded Tyler Wilkinson wore the CGM for 14 days July 26th to aug 8th. The average reading was 165, % time in target was 63, % time below target was 4, and % time above target was 33.  Overall he has had some mild improvement in his glycemic control.  We reviewed his CGM data, he works 3rd shift so timing is no traditional.  Overall he is having some lows but catching them quickly.  His sugar was also dropping quite precipatously while in office.  In talking with him I think he gave him self 2 units of insulin (1 click), when he entered and had a high  reading, then forgot and gave himself a second click as otherwise he should not be that sensitive.  In review of his readings I suspect this is happening at home too and I think he is over correcting.  No polyuria or polydispia.   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  He is a little resitant, mainly about cost.  I also discussed with him the omnipod, he will look into the affordability of this and get back to me (request info from the company)  Major depression, recurrent, chronic (HCC) HPI: Taking 1 tablet of wellbutrin a day,  Did not feel "quite right " on two tablets.  Still feels depressed, anhendonia, some trobule sleeping but better. Not completely hopeless but doesn't really want to work anymore.  He did not feel psychology was that helpful. However he is now interested in seeing psychiatry.  A: MDD not in remission  P: Referral to psychiatry.   ERECTILE DYSFUNCTION, ORGANIC HPI: Viagra was working well, requests refill.  A: Erectile dysfunction P: Refill Viagra 100mg  daily PRN Discussed again cannot be taken with nitrates- must notify EMS as this could cause dangerous drop in BP.  Essential hypertension HPI: No complaints.  Doing well, taking medications as directed.  A: Essential HTN well  controlled  P: Continue benazepril 40mg  daily, HCTZ 25mg  daily, Coreg 6.25mg  BID   Medications Ordered Meds ordered this encounter  Medications  . sildenafil (VIAGRA) 100 MG tablet    Sig: Take 1 tablet (100 mg total) by mouth as needed for erectile dysfunction.    Dispense:  20 tablet    Refill:  5   Other Orders Orders Placed This Encounter  Procedures  . Glucose, capillary  . Ambulatory referral to Social Work    Referral Priority:   Routine     Referral Type:   Consultation    Referral Reason:   Specialty Services Required    Number of Visits Requested:   1  . POC Hbg A1C   Follow Up: Return in about 3 months (around 06/26/2018).

## 2018-03-31 NOTE — Assessment & Plan Note (Signed)
HPI: No complaints.  Doing well, taking medications as directed.  A: Essential HTN well controlled  P: Continue benazepril 40mg  daily, HCTZ 25mg  daily, Coreg 6.25mg  BID

## 2018-03-31 NOTE — Assessment & Plan Note (Signed)
HPI: Taking 1 tablet of wellbutrin a day,  Did not feel "quite right " on two tablets.  Still feels depressed, anhendonia, some trobule sleeping but better. Not completely hopeless but doesn't really want to work anymore.  He did not feel psychology was that helpful. However he is now interested in seeing psychiatry.  A: MDD not in remission  P: Referral to psychiatry.

## 2018-03-31 NOTE — Assessment & Plan Note (Signed)
HPI: Using V-GO40 and Freesytle libre CGM. CGM downloaded Tyler SleighDavid W Wilkinson wore the CGM for 14 days July 26th to aug 8th. The average reading was 165, % time in target was 63, % time below target was 4, and % time above target was 33.  Overall he has had some mild improvement in his glycemic control.  We reviewed his CGM data, he works 3rd shift so timing is no traditional.  Overall he is having some lows but catching them quickly.  His sugar was also dropping quite precipatously while in office.  In talking with him I think he gave him self 2 units of insulin (1 click), when he entered and had a high reading, then forgot and gave himself a second click as otherwise he should not be that sensitive.  In review of his readings I suspect this is happening at home too and I think he is over correcting.  No polyuria or polydispia.   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  He is a little resitant, mainly about cost.  I also discussed with him the omnipod, he will look into the affordability of this and get back to me (request info from the company)

## 2018-03-31 NOTE — Assessment & Plan Note (Addendum)
HPI: Viagra was working well, requests refill.  A: Erectile dysfunction P: Refill Viagra 100mg  daily PRN Discussed again cannot be taken with nitrates- must notify EMS as this could cause dangerous drop in BP.

## 2018-04-01 ENCOUNTER — Encounter: Payer: Self-pay | Admitting: Internal Medicine

## 2018-04-02 MED ORDER — INSULIN ASPART 100 UNIT/ML ~~LOC~~ SOLN: SUBCUTANEOUS | 11 refills | Status: DC

## 2018-04-02 NOTE — Addendum Note (Signed)
Addended by: Carlynn PurlHOFFMAN, Sadiya Durand C on: 04/02/2018 04:55 PM   Modules accepted: Orders

## 2018-04-08 NOTE — Telephone Encounter (Signed)
Lupita LeashDonna I tried to call x 2 but did not get Onalee Huaavid.  If you can reach him please let him know... Lipid panel was not ordered as he told me he had not been taking crestor recently.  He was going to start back.  His last Lipid panel in May 2018 was excellent. Would rather wait to recheck when he is back on Crestor.

## 2018-04-09 DIAGNOSIS — G4733 Obstructive sleep apnea (adult) (pediatric): Secondary | ICD-10-CM | POA: Diagnosis not present

## 2018-04-16 DIAGNOSIS — G4733 Obstructive sleep apnea (adult) (pediatric): Secondary | ICD-10-CM | POA: Diagnosis not present

## 2018-04-21 ENCOUNTER — Telehealth: Payer: Self-pay | Admitting: Dietician

## 2018-04-21 NOTE — Telephone Encounter (Signed)
Left patient a message about why his lipid panel was not checked at his last visit.

## 2018-05-01 NOTE — Addendum Note (Signed)
Addended by: Neomia DearPOWERS, Presley Summerlin E on: 05/01/2018 06:23 PM   Modules accepted: Orders

## 2018-05-07 ENCOUNTER — Other Ambulatory Visit: Payer: Self-pay | Admitting: Internal Medicine

## 2018-05-07 DIAGNOSIS — I2510 Atherosclerotic heart disease of native coronary artery without angina pectoris: Secondary | ICD-10-CM

## 2018-05-07 DIAGNOSIS — I10 Essential (primary) hypertension: Secondary | ICD-10-CM

## 2018-05-10 DIAGNOSIS — G4733 Obstructive sleep apnea (adult) (pediatric): Secondary | ICD-10-CM | POA: Diagnosis not present

## 2018-05-14 ENCOUNTER — Ambulatory Visit: Payer: BLUE CROSS/BLUE SHIELD | Admitting: Internal Medicine

## 2018-06-09 DIAGNOSIS — G4733 Obstructive sleep apnea (adult) (pediatric): Secondary | ICD-10-CM | POA: Diagnosis not present

## 2018-06-12 ENCOUNTER — Telehealth: Payer: Self-pay | Admitting: Dietician

## 2018-06-12 NOTE — Telephone Encounter (Signed)
Tyler Wilkinson called with a new phone number. I was told it was the wrong number for him when trying to return his call.

## 2018-06-14 ENCOUNTER — Other Ambulatory Visit: Payer: Self-pay | Admitting: Internal Medicine

## 2018-06-23 ENCOUNTER — Telehealth: Payer: Self-pay | Admitting: Dietician

## 2018-06-23 NOTE — Telephone Encounter (Signed)
Per donnap. Called pt at [phone] r/t ?dehydration, cbg 140-150. Lm for rtc to triage nurse.

## 2018-07-08 ENCOUNTER — Telehealth: Payer: Self-pay | Admitting: Dietician

## 2018-07-10 NOTE — Telephone Encounter (Signed)
Bladder still bothering him, peeing a lot. Drinks at least 32 oz water a day.  Never came in for an appointment. He also says his legs have been painful. Advised he call our office on Monday and if symptoms worsen to go to there urgent care or emergency room. He reports his sugar average is 190-200 per his CGM. He says his depression is also a problem lately. He'd Dr. Mikey BussingHoffman to call him.

## 2018-07-20 ENCOUNTER — Other Ambulatory Visit: Payer: Self-pay

## 2018-07-20 MED ORDER — FREESTYLE LIBRE 14 DAY READER DEVI: 1.0000 | Freq: Every day | 0 refills | Status: DC

## 2018-07-20 MED ORDER — FREESTYLE LIBRE 14 DAY SENSOR MISC: 1.0000 | Freq: Every day | 6 refills | Status: DC

## 2018-07-20 NOTE — Telephone Encounter (Signed)
Will send to dr Mikey Bussinghoffman

## 2018-07-20 NOTE — Telephone Encounter (Signed)
Continuous Blood Gluc Sensor (FREESTYLE LIBRE 14 DAY SENSOR) MISC, Requesting new Rx to be sent to [facility] on [address].

## 2018-08-06 ENCOUNTER — Other Ambulatory Visit: Payer: Self-pay

## 2018-08-06 ENCOUNTER — Emergency Department (HOSPITAL_COMMUNITY)
Admission: EM | Admit: 2018-08-06 | Discharge: 2018-08-06 | Disposition: A | Discharge status: Home / Self Care | Payer: BLUE CROSS/BLUE SHIELD | Attending: Emergency Medicine | Admitting: Emergency Medicine

## 2018-08-06 ENCOUNTER — Encounter (HOSPITAL_COMMUNITY): Payer: Self-pay | Admitting: Emergency Medicine

## 2018-08-06 ENCOUNTER — Other Ambulatory Visit: Payer: Self-pay | Admitting: *Deleted

## 2018-08-06 ENCOUNTER — Emergency Department (HOSPITAL_COMMUNITY): Payer: BLUE CROSS/BLUE SHIELD

## 2018-08-06 ENCOUNTER — Telehealth: Payer: Self-pay | Admitting: *Deleted

## 2018-08-06 DIAGNOSIS — Z7982 Long term (current) use of aspirin: Secondary | ICD-10-CM | POA: Insufficient documentation

## 2018-08-06 DIAGNOSIS — I2510 Atherosclerotic heart disease of native coronary artery without angina pectoris: Secondary | ICD-10-CM | POA: Diagnosis not present

## 2018-08-06 DIAGNOSIS — R05 Cough: Secondary | ICD-10-CM | POA: Diagnosis not present

## 2018-08-06 DIAGNOSIS — E119 Type 2 diabetes mellitus without complications: Secondary | ICD-10-CM | POA: Insufficient documentation

## 2018-08-06 DIAGNOSIS — R0789 Other chest pain: Secondary | ICD-10-CM | POA: Diagnosis not present

## 2018-08-06 DIAGNOSIS — R079 Chest pain, unspecified: Secondary | ICD-10-CM | POA: Diagnosis present

## 2018-08-06 DIAGNOSIS — I10 Essential (primary) hypertension: Secondary | ICD-10-CM | POA: Diagnosis not present

## 2018-08-06 DIAGNOSIS — K219 Gastro-esophageal reflux disease without esophagitis: Secondary | ICD-10-CM | POA: Insufficient documentation

## 2018-08-06 DIAGNOSIS — E785 Hyperlipidemia, unspecified: Secondary | ICD-10-CM | POA: Diagnosis not present

## 2018-08-06 DIAGNOSIS — Z794 Long term (current) use of insulin: Secondary | ICD-10-CM | POA: Diagnosis not present

## 2018-08-06 DIAGNOSIS — I252 Old myocardial infarction: Secondary | ICD-10-CM | POA: Diagnosis not present

## 2018-08-06 DIAGNOSIS — Z79899 Other long term (current) drug therapy: Secondary | ICD-10-CM | POA: Diagnosis not present

## 2018-08-06 DIAGNOSIS — Z87891 Personal history of nicotine dependence: Secondary | ICD-10-CM | POA: Insufficient documentation

## 2018-08-06 DIAGNOSIS — J45909 Unspecified asthma, uncomplicated: Secondary | ICD-10-CM | POA: Insufficient documentation

## 2018-08-06 LAB — BASIC METABOLIC PANEL
Anion gap: 12 (ref 5–15)
BUN: 11 mg/dL (ref 8–23)
CO2: 28 mmol/L (ref 22–32)
Calcium: 9.3 mg/dL (ref 8.9–10.3)
Chloride: 102 mmol/L (ref 98–111)
Creatinine, Ser: 0.99 mg/dL (ref 0.61–1.24)
GFR calc non Af Amer: >60 mL/min (ref >60)
Glucose, Bld: 132 mg/dL — ABNORMAL HIGH (ref 70–99)
Potassium: 3.5 mmol/L (ref 3.5–5.1)
Sodium: 142 mmol/L (ref 135–145)

## 2018-08-06 LAB — CBC WITH DIFFERENTIAL/PLATELET
Abs Immature Granulocytes: 0.02 10*3/uL (ref 0.00–0.07)
Basophils Absolute: 0 10*3/uL (ref 0.0–0.1)
Basophils Relative: 0 %
Eosinophils Absolute: 0.4 10*3/uL (ref 0.0–0.5)
Eosinophils Relative: 5 %
HCT: 43.8 % (ref 39.0–52.0)
Hemoglobin: 14.9 g/dL (ref 13.0–17.0)
Immature Granulocytes: 0 %
Lymphocytes Relative: 22 %
Lymphs Abs: 1.6 10*3/uL (ref 0.7–4.0)
MCH: 28.3 pg (ref 26.0–34.0)
MCHC: 34 g/dL (ref 30.0–36.0)
MCV: 83.1 fL (ref 80.0–100.0)
MONOS PCT: 8 %
Monocytes Absolute: 0.5 10*3/uL (ref 0.1–1.0)
Neutro Abs: 4.7 10*3/uL (ref 1.7–7.7)
Neutrophils Relative %: 65 %
Platelets: 192 10*3/uL (ref 150–400)
RBC: 5.27 MIL/uL (ref 4.22–5.81)
RDW: 12 % (ref 11.5–15.5)
WBC: 7.2 10*3/uL (ref 4.0–10.5)
nRBC: 0 % (ref 0.0–0.2)

## 2018-08-06 LAB — I-STAT TROPONIN, ED: Troponin i, poc: 0 ng/mL (ref 0.00–0.08)

## 2018-08-06 MED ORDER — PANTOPRAZOLE SODIUM 40 MG PO TBEC: 40.0000 mg | DELAYED_RELEASE_TABLET | Freq: Every day | ORAL | 1 refills | Status: DC

## 2018-08-06 MED ORDER — ALUM & MAG HYDROXIDE-SIMETH 200-200-20 MG/5ML PO SUSP
30.0000 mL | Freq: Once | ORAL | Status: AC
  Administered 2018-08-06: 30 mL via ORAL
  Filled 2018-08-06: qty 30

## 2018-08-06 MED ORDER — PANTOPRAZOLE SODIUM 40 MG PO TBEC
40.0000 mg | DELAYED_RELEASE_TABLET | Freq: Once | ORAL | Status: AC
  Administered 2018-08-06: 40 mg via ORAL
  Filled 2018-08-06: qty 1

## 2018-08-06 NOTE — ED Notes (Signed)
ED Provider at bedside. 

## 2018-08-06 NOTE — Telephone Encounter (Signed)
Call from pt - stated he has been throwing up; 4 times last night. Stated ? Indigestion but had been having chest pressure and shoulder blade pain last night. Hx of MI. Stated had some pain this am.  Stated he's on 5270 ; he was on his way to work but decided not to.Informed pt to go to ED to be evaluated. At first he did not want to go; he finally decided to go to the ED and stated if his condition changes , he know to call 911.

## 2018-08-06 NOTE — Discharge Instructions (Signed)
There is no sign of heart or lung problems today.  We are starting you on a medication to treat acid, or reflux.  You can supplement this with an antacid such as Maalox before meals and at bedtime.  People like to use a tablet antacid such as Tums, instead.  You can return here if needed for problems.  Follow-up with your primary care doctor for ongoing management.  You may have to stay on medication for several months or years.

## 2018-08-06 NOTE — ED Triage Notes (Signed)
Pt reports that he had indigestion with 4 episodes of vomiting. Last night and then began having chest pressure and epigastric pain. Pt also reports pain under his right arm. Hx of NSTEMI

## 2018-08-06 NOTE — Telephone Encounter (Signed)
F/U call  - states he just arrived at the ED; and he feels "bad" after going home to eat something (he didn't want his BS to drop).

## 2018-08-06 NOTE — ED Provider Notes (Signed)
[facility] EMERGENCY DEPARTMENT Provider Note   CSN: [id number] Arrival date & time: 08/06/18  1631     History   Chief Complaint Chief Complaint  Patient presents with  . Chest Pain  . Gastroesophageal Reflux    HPI Tyler Wilkinson is a 62 y.o. male.  HPI   He presents for evaluation of chest pressure, currently rated 6-7 over 10, constant since noon and worsening.  He complains of vomiting which started about 1 AM today as he was getting off work.  He vomited several times at that point.  Did not see any blood in it.  He then went home and went to sleep, awoke at 9 AM and tried to drink some soda.  That point he began vomiting again.  At that time he noticed some pressure-like discomfort in his bilateral lower chest.  This discomfort persisted until he went to sleep after about an hour, then woke up again at noon today.  At that time the chest pressure was there, but not as bad.  Has gradually worsened but never gone away.  He denies shortness of breath at this time.  He states he began coughing last night at the onset of the vomiting.  He thinks he might have eaten some bad potato salad about 5 PM last night, while at work.  He works in a [employer], educational job.  He is concerned that he might of had a heart attack because previously he had a heart attack requiring a stent about 12 years ago.  He denies dizziness, weakness, paresthesias.  There are no other known modifying factors.  Past Medical History:  Diagnosis Date  . Asthma   . CAD (coronary artery disease)    stent RCA 2006 (other residual disease). /  nuclear 2008  no ischemia, inferolateral scar.  . Cataract   . Chronic bronchitis ([place])    "anytime I get sick it goes into bronchitis; probably get it q yr"  . Daily headache    "just recently" (05/10/2014)  . Dyslipidemia   . Ejection fraction    EF 55%,echo, 2008, inferolateral hypokinesis,  . Erectile dysfunction   . Gastroparesis   . Hypertension   .  Myocardial infarction ([place]) 2006  . Severe obstructive sleep apnea-hypopnea syndrome 04/15/2009   Sleep study 03/15/2009 AHI of 82.2/hr oxygen desaturation nadir of 77%, loud snoring. Did not tolerate CPAP due to mask.  . 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 Tooth pain 05/23/2017  . URI (upper respiratory infection) 05/21/2017  . Abdominal aortic atherosclerosis ([place]) 12/19/2016  . Vomiting 11/14/2016  . Tinea corporis 01/11/2016  . H/O umbilical hernia repair 36/64/4034  . Chronic back pain 10/07/2014  . Major depression, recurrent, chronic ([place]) 10/07/2014  . Essential hypertension 05/10/2014  . Sprain of right ankle 07/10/2012  . Diabetic gastroparesis ([place]) 05/05/2012  . Dental caries 05/05/2012  . Hyperlipidemia   . CAD (coronary artery disease), native coronary artery   . Bilateral carpal tunnel syndrome 06/09/2010  . ULNAR NEUROPATHY 06/09/2010  . Obstructive sleep apnea hypopnea, severe 04/15/2009  . CERUMEN IMPACTION 02/24/2009  . ASTHMA, INTRINSIC, WITH ACUTE EXACERBATION 12/24/2006  . Allergic rhinitis 07/28/2006  . ERECTILE DYSFUNCTION, ORGANIC 07/28/2006  . 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 ([place]) 06/03/2006    Past Surgical History:  Procedure Laterality Date  . CORONARY ANGIOPLASTY WITH STENT PLACEMENT  ~ 2006   "  1"  . LAPAROSCOPIC APPENDECTOMY N/A 11/16/2014   Procedure: APPENDECTOMY LAPAROSCOPIC;  Surgeon: Greer Pickerel, MD;  Location: [facility];  Service: General;  Laterality: N/A;  . UMBILICAL HERNIA REPAIR N/A 11/16/2014   Procedure: OPEN PRIMARY UMBILICAL HERNIA REPAIR ;  Surgeon: Greer Pickerel, MD;  Location: [facility];  Service: General;  Laterality: N/A;        Home Medications    Prior to Admission medications   Medication Sig Start Date End Date Taking? Authorizing Provider  albuterol (PROVENTIL HFA;VENTOLIN HFA) 108 (90 Base) MCG/ACT inhaler Inhale 2  puffs into the lungs every 6 (six) hours as needed for wheezing or shortness of breath. 05/21/17   Velna Ochs, MD  ALPRAZolam Duanne Moron) 0.5 MG tablet TAKE 1 TABLET BY MOUTH AT BEDTIME AS NEEDED FOR ANXIETY OR SLEEP 06/16/18   Lucious Groves, DO  Arginine 500 MG CAPS Take 500 mg by mouth daily.    [provider]  aspirin EC 81 MG tablet Take 1 tablet (81 mg total) by mouth daily. 08/05/14   Burns, Arloa Koh, MD  benazepril (LOTENSIN) 40 MG tablet TAKE 1 TABLET BY MOUTH ONCE DAILY 02/16/18   Lucious Groves, DO  Blood Glucose Monitoring Suppl (BAYER CONTOUR NEXT USB MONITOR) w/Device KIT Check blood sugar up to 6 times a day 05/08/16   Lucious Groves, DO  buPROPion (WELLBUTRIN XL) 150 MG 24 hr tablet Take 1 pill once a day for 3 days then start taking twice daily. 10/16/17 01/16/18  Lucious Groves, DO  carvedilol (COREG) 6.25 MG tablet TAKE 1 TABLET BY MOUTH TWICE DAILY 05/08/18   Lucious Groves, DO  Chlorpheniramine-APAP (CORICIDIN) 2-325 MG TABS Take 1 tablet by mouth every 6 (six) hours as needed. 05/21/17   Velna Ochs, MD  Continuous Blood Gluc Receiver (FREESTYLE LIBRE 14 DAY READER) DEVI 1 each by Does not apply route 6 (six) times daily. 07/20/18   Lucious Groves, DO  Continuous Blood Gluc Sensor (FREESTYLE LIBRE 14 DAY SENSOR) MISC 1 each by Does not apply route 6 (six) times daily. 07/20/18   Lucious Groves, DO  COSOPT PF 22.3-6.8 MG/ML SOLN ophthalmic solution INSTILL 1 DROP INTO BOTH EYES TWICE A DAY 11/23/16   [provider]  Flaxseed, Linseed, (FLAX SEED OIL PO) Take by mouth daily.      [provider]  fluticasone (FLONASE) 50 MCG/ACT nasal spray Place 2 sprays into both nostrils daily. 05/21/17 06/04/17  Velna Ochs, MD  glucose blood (BAYER CONTOUR NEXT TEST) test strip Check blood sugar up to 6 times a day 07/02/17   Lucious Groves, DO  hydrochlorothiazide (HYDRODIURIL) 25 MG tablet Take 1 tablet (25 mg total) daily by mouth. 06/30/17   Kathi Ludwig, MD  insulin aspart (NOVOLOG) 100 UNIT/ML injection Use to fill VGO-40, use 40 units basal, 2 clinics for small meals, 4-5 clicks for larger meals.  Max 76 units daily 04/02/18   Lucious Groves, DO  Insulin Disposable Pump (V-GO 40) KIT 1 Units by Does not apply route every morning. 12/31/17   Lucious Groves, DO  insulin lispro (HUMALOG) 100 UNIT/ML injection Use to fill Vgo30 daily, 30 units basal, 2 clicks for smaller meals, 4-5 clicks for larger meals. Max 66 units daily, Normal 10/02/17 10/02/18  Forde Dandy, PharmD  Insulin Syringe-Needle U-100 (INSULIN SYRINGE .5CC/31GX5/16") 31G X 5/16" 0.5 ML MISC Use to inject insulin 3 times a day Dx code 250.00 02/18/11   Trish Fountain, MD  latanoprost (XALATAN) 0.005 % ophthalmic solution Place 1 drop into both eyes at bedtime. 06/01/11   [provider]  levocetirizine (XYZAL) 5 MG tablet Take 1 tablet (5 mg total) by mouth every evening. 07/06/15   Lucious Groves, DO  nitroGLYCERIN (NITROSTAT) 0.4 MG SL tablet Place 1 tablet (0.4 mg total) under the tongue every 5 (five) minutes as needed for chest pain (DO NOT USE IF YOU HAVE TAKEN VIAGRA). 08/05/14   Burns, Arloa Koh, MD  ONETOUCH DELICA LANCETS 70J MISC Use to check blood sugars up to 4 times a day. Dx code:E11.65 08/08/15   Lucious Groves, DO  pantoprazole (PROTONIX) 40 MG tablet Take 1 tablet (40 mg total) by mouth daily. 08/06/18   Daleen Bo, MD  rosuvastatin (CRESTOR) 5 MG tablet Take 1 tablet (5 mg total) daily by mouth. 06/30/17 06/25/18  Kathi Ludwig, MD  sildenafil (VIAGRA) 100 MG tablet Take 1 tablet (100 mg total) by mouth as needed for erectile dysfunction. 03/26/18 03/26/19  Lucious Groves, DO  triamcinolone (NASACORT AQ) 55 MCG/ACT AERO nasal inhaler Place 1 spray into the nose 2 (two) times daily. 12/27/15   Riccardo Dubin, MD  VITAMIN D, CHOLECALCIFEROL, PO Take 1 capsule by mouth daily.    [provider]    Family History Family History    Problem Relation Age of Onset  . Diabetes Mother   . Alcoholism Father     Social History Social History   Tobacco Use  . Smoking status: Former Smoker    Years: 0.20    Types: Cigarettes    Last attempt to quit: 01/26/1973    Years since quitting: 45.5  . Smokeless tobacco: Never Used  Substance Use Topics  . Alcohol use: Yes    Alcohol/week: 0.0 standard drinks    Comment: Beer rarely.  . Drug use: Yes    Types: Marijuana    Comment: Occasionally.     Allergies   Patient has no known allergies.   Review of Systems Review of Systems  All other systems reviewed and are negative.    Physical Exam Updated Vital Signs BP (!) 141/82   Pulse 76   Temp 98 F (36.7 C)   Resp 19   Ht 6' 1"  (1.854 m)   Wt 95.3 kg   SpO2 97%   BMI 27.71 kg/m   Physical Exam Vitals signs and nursing note reviewed.  Constitutional:      General: He is not in acute distress.    Appearance: Normal appearance. He is well-developed. He is not ill-appearing, toxic-appearing or diaphoretic.  HENT:     Head: Normocephalic and atraumatic.     Right Ear: External ear normal.     Left Ear: External ear normal.     Nose: Nose normal.     Mouth/Throat:     Mouth: Mucous membranes are moist.  Eyes:     Conjunctiva/sclera: Conjunctivae normal.     Pupils: Pupils are equal, round, and reactive to light.  Neck:     Musculoskeletal: Normal range of motion and neck supple.     Trachea: Phonation normal.  Cardiovascular:     Rate and Rhythm: Normal rate and regular rhythm.     Heart sounds: Normal heart sounds.  Pulmonary:     Effort: Pulmonary effort is normal.     Breath sounds: Normal breath sounds.  Abdominal:     General: There is no distension.     Palpations: Abdomen is soft.  Tenderness: There is no abdominal tenderness. There is no guarding.  Musculoskeletal: Normal range of motion.  Skin:    General: Skin is warm and dry.     Coloration: Skin is not jaundiced or pale.   Neurological:     Mental Status: He is alert and oriented to person, place, and time.     Cranial Nerves: No cranial nerve deficit.     Sensory: No sensory deficit.     Motor: No abnormal muscle tone.     Coordination: Coordination normal.  Psychiatric:        Behavior: Behavior normal.        Thought Content: Thought content normal.        Judgment: Judgment normal.      ED Treatments / Results  Labs (all labs ordered are listed, but only abnormal results are displayed) Labs Reviewed  BASIC METABOLIC PANEL - Abnormal; Notable for the following components:      Result Value   Glucose, Bld 132 (*)    All other components within normal limits  CBC WITH DIFFERENTIAL/PLATELET  I-STAT TROPONIN, ED    EKG EKG Interpretation  Date/Time:  Thursday August 06 2018 16:44:59 EST Ventricular Rate:  73 PR Interval:    QRS Duration: 99 QT Interval:  378 QTC Calculation: 417 R Axis:   50 Text Interpretation:  Sinus rhythm Low voltage, precordial leads Baseline wander in lead(s) V3 since last tracing no significant change Confirmed by Daleen Bo [phone]) on 08/06/2018 4:48:55 PM   Radiology Dg Chest Port 1 View  Result Date: 08/06/2018 CLINICAL DATA:  62 y/o M; 1 month of indigestion, cough. Pressure on the right and left-sided chest. History of heart attack. EXAM: PORTABLE CHEST 1 VIEW COMPARISON:  05/10/2014 chest radiograph. FINDINGS: Stable heart size and mediastinal contours are within normal limits. Both lungs are clear. The visualized skeletal structures are unremarkable. IMPRESSION: No active disease. Electronically Signed   By: Kristine Garbe M.D.   On: 08/06/2018 18:13    Procedures Procedures (including critical care time)  Medications Ordered in ED Medications  alum & mag hydroxide-simeth (MAALOX/MYLANTA) 200-200-20 MG/5ML suspension 30 mL (30 mLs Oral Given 08/06/18 1713)  pantoprazole (PROTONIX) EC tablet 40 mg (40 mg Oral Given 08/06/18 1713)      Initial Impression / Assessment and Plan / ED Course  I have reviewed the triage vital signs and the nursing notes.  Pertinent labs & imaging results that were available during my care of the patient were reviewed by me and considered in my medical decision making (see chart for details).  Clinical Course as of Aug 06 1926  Thu Aug 06, 2018  1911 Normal  I-stat troponin, ED [EW]  1911 Normal except glucose low  Basic metabolic panel(!) [EW]  9417 Normal  CBC with Differential [EW]  1911 No CHF or pneumonia, images reviewed by me  DG Chest [facility] 1 View [EW]    Clinical Course User Index [EW] Daleen Bo, MD     Patient Vitals for the past 24 hrs:  BP Temp Pulse Resp SpO2 Height Weight  08/06/18 1800 (!) 141/82 - 76 19 97 % - -  08/06/18 1745 135/81 - 73 19 95 % - -  08/06/18 1730 (!) 162/88 - 73 18 93 % - -  08/06/18 1715 (!) 165/83 - 76 15 97 % - -  08/06/18 1706 (!) 148/86 - 73 20 99 % - -  08/06/18 1700 - - - - 99 % - -  08/06/18 1646 (!) 161/79 98 F (36.7 C) 71 15 100 % - -  08/06/18 1645 (!) 161/79 - - 10 - 6' 1"  (1.854 m) 95.3 kg    7:12 PM Reevaluation with update and discussion. After initial assessment and treatment, an updated evaluation reveals he states he feels "like a new man". Daleen Bo   Medical Decision Making: Evaluation consistent with GERD/heartburn.  Doubt ACS, PE or pneumonia.  CRITICAL CARE-no Performed by: Daleen Bo   Nursing Notes Reviewed/ Care Coordinated Applicable Imaging Reviewed Interpretation of Laboratory Data incorporated into ED treatment  The patient appears reasonably screened and/or stabilized for discharge and I doubt any other medical condition or other [facility] requiring further screening, evaluation, or treatment in the ED at this time prior to discharge.  Plan: Home Medications-continue usual medications, take Maalox AC at bedtime as needed; Home Treatments-gradually advance diet; return here if the recommended  treatment, does not improve the symptoms; Recommended follow up-ECP checkup 1 week and as needed    Final Clinical Impressions(s) / ED Diagnoses   Final diagnoses:  Gastroesophageal reflux disease, esophagitis presence not specified    ED Discharge Orders         Ordered    pantoprazole (PROTONIX) 40 MG tablet  Daily     08/06/18 1924           Daleen Bo, MD 08/06/18 1927

## 2018-08-07 MED ORDER — ALPRAZOLAM 0.5 MG PO TABS: ORAL_TABLET | ORAL | 0 refills | Status: DC

## 2018-08-07 NOTE — Telephone Encounter (Signed)
Needs visit with me to be scheduled

## 2018-08-21 ENCOUNTER — Telehealth: Payer: Self-pay | Admitting: Dietician

## 2018-08-21 NOTE — Telephone Encounter (Signed)
Offered support and reminded him he is due for an appointment with Dr. Mikey Bussing.

## 2018-08-27 ENCOUNTER — Other Ambulatory Visit: Payer: Self-pay | Admitting: *Deleted

## 2018-08-27 DIAGNOSIS — I10 Essential (primary) hypertension: Secondary | ICD-10-CM

## 2018-08-27 MED ORDER — BENAZEPRIL HCL 40 MG PO TABS: 40.0000 mg | ORAL_TABLET | Freq: Every day | ORAL | 1 refills | Status: DC

## 2018-08-27 NOTE — Telephone Encounter (Signed)
Next appt scheduled  2/27 with PCP. 

## 2018-08-28 ENCOUNTER — Telehealth: Payer: Self-pay | Admitting: Dietician

## 2018-08-28 ENCOUNTER — Encounter: Payer: Self-pay | Admitting: Internal Medicine

## 2018-08-28 ENCOUNTER — Other Ambulatory Visit: Payer: Self-pay

## 2018-08-28 ENCOUNTER — Ambulatory Visit (INDEPENDENT_AMBULATORY_CARE_PROVIDER_SITE_OTHER): Payer: BLUE CROSS/BLUE SHIELD | Admitting: Internal Medicine

## 2018-08-28 VITALS — BP 157/73 | HR 81 | Temp 97.7°F | Ht 72.0 in | Wt 227.9 lb

## 2018-08-28 DIAGNOSIS — N401 Enlarged prostate with lower urinary tract symptoms: Secondary | ICD-10-CM

## 2018-08-28 DIAGNOSIS — Z9641 Presence of insulin pump (external) (internal): Secondary | ICD-10-CM

## 2018-08-28 DIAGNOSIS — R3914 Feeling of incomplete bladder emptying: Secondary | ICD-10-CM | POA: Diagnosis not present

## 2018-08-28 DIAGNOSIS — Z794 Long term (current) use of insulin: Secondary | ICD-10-CM

## 2018-08-28 DIAGNOSIS — I10 Essential (primary) hypertension: Secondary | ICD-10-CM

## 2018-08-28 DIAGNOSIS — Z9111 Patient's noncompliance with dietary regimen: Secondary | ICD-10-CM

## 2018-08-28 DIAGNOSIS — E1143 Type 2 diabetes mellitus with diabetic autonomic (poly)neuropathy: Secondary | ICD-10-CM | POA: Diagnosis not present

## 2018-08-28 DIAGNOSIS — R350 Frequency of micturition: Secondary | ICD-10-CM

## 2018-08-28 DIAGNOSIS — Z79899 Other long term (current) drug therapy: Secondary | ICD-10-CM

## 2018-08-28 DIAGNOSIS — Z9114 Patient's other noncompliance with medication regimen: Secondary | ICD-10-CM

## 2018-08-28 LAB — POCT URINALYSIS DIPSTICK
Bilirubin, UA: NEGATIVE
Blood, UA: NEGATIVE
Glucose, UA: POSITIVE — AB
Ketones, UA: NEGATIVE
Leukocytes, UA: NEGATIVE
Nitrite, UA: NEGATIVE
Protein, UA: NEGATIVE
Spec Grav, UA: 1.02 (ref 1.010–1.025)
Urobilinogen, UA: 0.2 E.U./dL
pH, UA: 5.5 (ref 5.0–8.0)

## 2018-08-28 LAB — POCT GLYCOSYLATED HEMOGLOBIN (HGB A1C): Hemoglobin A1C: 8.5 % — AB (ref 4.0–5.6)

## 2018-08-28 LAB — GLUCOSE, CAPILLARY: Glucose-Capillary: 114 mg/dL — ABNORMAL HIGH (ref 70–99)

## 2018-08-28 MED ORDER — AMLODIPINE BESYLATE 5 MG PO TABS: 5.0000 mg | ORAL_TABLET | Freq: Every day | ORAL | 1 refills | Status: DC

## 2018-08-28 MED ORDER — TAMSULOSIN HCL 0.4 MG PO CAPS: 0.4000 mg | ORAL_CAPSULE | Freq: Every day | ORAL | 1 refills | Status: DC

## 2018-08-28 NOTE — Patient Instructions (Signed)
FOLLOW-UP INSTRUCTIONS When: February 27th for a routine visit with Dr. Mikey Bussing What to bring: All of your medications  I have stopped your hydrochlorothiazide and started amlodipine instead. I have started tamsulosin (flomax) for the prostate enlargement.  I would encourage you to increase the use of your V-Go insulin device when consuming large late night meals. In addition, please consider decreasing the amount of high sugar foods that you consume.   If your increase urination does not improve in the next 7-10 days please feel free to let us know.  I will notify you of the results of any labs from today's evaluation when available to me.   Thank you for your visit to the [facility] today. If you have any questions or concerns please call us at [phone].

## 2018-08-28 NOTE — Telephone Encounter (Addendum)
[name] asks about coupons for Novolog prescription. Will consult pharmacy.

## 2018-08-28 NOTE — Progress Notes (Signed)
   CC: "Peeing all the time"  HPI:Mr.Marti SleighDavid W Favorite is a 63 y.o. male who presents for evaluation of with increased urinary frequency and volume. Please see individual problem based A/P for details.  Past Medical History:  Diagnosis Date  . Asthma   . CAD (coronary artery disease)    stent RCA 2006 (other residual disease). /  nuclear 2008  no ischemia, inferolateral scar.  . Cataract   . Chronic bronchitis ([facility])    "anytime I get sick it goes into bronchitis; probably get it q yr"  . Daily headache    "just recently" (05/10/2014)  . Dyslipidemia   . Ejection fraction    EF 55%,echo, 2008, inferolateral hypokinesis,  . Erectile dysfunction   . Gastroparesis   . Hypertension   . Myocardial infarction ([facility]) 2006  . Severe obstructive sleep apnea-hypopnea syndrome 04/15/2009   Sleep study 03/15/2009 AHI of 82.2/hr oxygen desaturation nadir of 77%, loud snoring. Did not tolerate CPAP due to mask.  . Type 2 diabetes mellitus with complications ([facility]) dx'd 1972   Review of Systems:  ROS negative except as per HPI.  Physical Exam: Vitals:   08/28/18 1425  BP: (!) 157/73  Pulse: 81  Temp: 97.7 F (36.5 C)  TempSrc: Oral  SpO2: 99%  Weight: 227 lb 14.4 oz (103.4 kg)  Height: 6' (1.829 m)   General: A/O x4, in no acute distress, afebrile nondiaphoretic Cardio: RRR, no mrgs, Pulmonary: CTA bilaterally  Assessment & Plan:   See Encounters Tab for problem based charting.  Patient seen with Dr. Oswaldo DoneVincent

## 2018-08-29 ENCOUNTER — Encounter: Payer: Self-pay | Admitting: Internal Medicine

## 2018-08-29 LAB — BMP8+ANION GAP
Anion Gap: 17 mmol/L (ref 10.0–18.0)
BUN/Creatinine Ratio: 12 (ref 10–24)
BUN: 15 mg/dL (ref 8–27)
CO2: 17 mmol/L — AB (ref 20–29)
Calcium: 9.8 mg/dL (ref 8.6–10.2)
Chloride: 103 mmol/L (ref 96–106)
Creatinine, Ser: 1.26 mg/dL (ref 0.76–1.27)
GFR calc Af Amer: 70 mL/min/{1.73_m2} (ref >59)
GFR calc non Af Amer: 61 mL/min/{1.73_m2} (ref >59)
Glucose: 126 mg/dL — ABNORMAL HIGH (ref 65–99)
Potassium: 3.9 mmol/L (ref 3.5–5.2)
Sodium: 137 mmol/L (ref 134–144)

## 2018-08-29 LAB — OSMOLALITY, URINE: Osmolality, Ur: 367 mOsmol/kg

## 2018-08-29 LAB — PSA: Prostate Specific Ag, Serum: 1.3 ng/mL (ref 0.0–4.0)

## 2018-08-29 NOTE — Assessment & Plan Note (Signed)
HTN: Patients BP often elevated, he admits to medication non-adherence on occasion including prior to today's visit. Given that we are discontinuing his HCTZ due to polyuria, I have initiated treatment with Norvasc.   Plan:  Amlodipine 5mg  daily Continue Benazepril 40mg  daily BMP unremarkable

## 2018-08-29 NOTE — Assessment & Plan Note (Addendum)
Prostate hypertrophy with increased urinary frequency: Urinary symptoms first began ~8 months prior but initially resolved after  one week before resuming early in December. He describes high volume high frequency urinary episodes. He endorses consuming high quantities of fluid to maintain output in an attempt to avoid dehydration. Denied urinary leakage between episodes, he endorses increased urge but has never lost control. The patient denied dysuria, hematuria, abdominal pain, pelvic pain, penial pain, scrotal pain, muscle aches, fever, chills, flank pain, or joint pain.  Review of his CGM as per diabetes A/P appears stable. POC UA demonstrated positive glucose at 250 but no leukocytes, nitrites or hemoglobin.  Following the evaluation of his prostate with [place] and post void residual the patient stated that perhaps the volume was less than he initially thought during each episode. Given the patients symptoms, concern for obstruction was noted. A post void residual after two episodes of micturition while waiting to be seen revealed (NML ~12). Although not the routinely accepted for acute obstruction this certainly isn't normal. Prostate US revealed an ~47ml (images available on the [place] card) greater than the 29ml upper limit of normal. This combined with his symptoms was concerning for prostate hypertrophy.  Plan:  Start Tamsulosin 0.4mg  daily Ordered PSA -returned at 1.3, WNL's, likely BPH Will monitor symptoms on tamsulosin BMP with normal surrogate renal function markers Spot urine osmolality not unexpectedly abnormal

## 2018-08-29 NOTE — Assessment & Plan Note (Signed)
DMII: Review of the patients CGM data demonstrates 174x14 day average and a 196x90 day average. He attests to dietary indiscretion routinely as well as not completing his V-Go bolus insulin treatment. We have discussed alternative therapy options as well as adherence to his current therapy. He spoke with Norm Parcel our diabetes counselor who also advised the same and reeducated the patient on his V-go and diet briefly.  I feel that uncontrolled diabetes with rising A1c is contributing to his increased urinary frequency and polydipsia given the glucose in his urine.   Plan:  Continue V-Go 40 with better meal time calculations Consider PO GLP1 or GPP4 if patient is amenable? Would need to titrate up his treatment to prevent excess glucosuria thus diminishing the osmotic urinary diuresis. I advised him to stop drinking regular [facility].

## 2018-08-31 NOTE — Progress Notes (Signed)
Internal Medicine Clinic Attending  I saw and evaluated the patient.  I personally confirmed the key portions of the history and exam documented by Dr. Harbrecht and I reviewed pertinent patient test results.  The assessment, diagnosis, and plan were formulated together and I agree with the documentation in the resident's note.  

## 2018-09-01 ENCOUNTER — Telehealth: Payer: Self-pay | Admitting: Dietician

## 2018-09-01 NOTE — Telephone Encounter (Signed)
Coupons faxed successfully to his pharmacy.

## 2018-09-01 NOTE — Telephone Encounter (Signed)
Patient called to ask for lab results. Read lab results to him over the phone and Dr. Godfrey Pick comments about his labs. Tyler Wilkinson was appreciative and verbalized understanding of Dr. Godfrey Pick instructions/plan.

## 2018-09-17 ENCOUNTER — Other Ambulatory Visit: Payer: Self-pay | Admitting: *Deleted

## 2018-09-17 MED ORDER — ALPRAZOLAM 0.5 MG PO TABS: ORAL_TABLET | ORAL | 0 refills | Status: DC

## 2018-09-17 NOTE — Telephone Encounter (Signed)
Next appt scheduled  2/27 with PCP. 

## 2018-09-30 ENCOUNTER — Other Ambulatory Visit: Payer: Self-pay

## 2018-09-30 ENCOUNTER — Ambulatory Visit (INDEPENDENT_AMBULATORY_CARE_PROVIDER_SITE_OTHER): Payer: BLUE CROSS/BLUE SHIELD | Admitting: Internal Medicine

## 2018-09-30 ENCOUNTER — Telehealth: Payer: Self-pay | Admitting: *Deleted

## 2018-09-30 DIAGNOSIS — R509 Fever, unspecified: Secondary | ICD-10-CM | POA: Diagnosis not present

## 2018-09-30 DIAGNOSIS — R112 Nausea with vomiting, unspecified: Secondary | ICD-10-CM

## 2018-09-30 DIAGNOSIS — R0981 Nasal congestion: Secondary | ICD-10-CM | POA: Diagnosis not present

## 2018-09-30 DIAGNOSIS — R6889 Other general symptoms and signs: Secondary | ICD-10-CM | POA: Insufficient documentation

## 2018-09-30 DIAGNOSIS — R05 Cough: Secondary | ICD-10-CM

## 2018-09-30 DIAGNOSIS — M791 Myalgia, unspecified site: Secondary | ICD-10-CM

## 2018-09-30 HISTORY — DX: Other general symptoms and signs: R68.89

## 2018-09-30 MED ORDER — OSELTAMIVIR PHOSPHATE 75 MG PO CAPS: 75.0000 mg | ORAL_CAPSULE | Freq: Two times a day (BID) | ORAL | 0 refills | Status: AC

## 2018-09-30 MED ORDER — HYDROCOD POLST-CPM POLST ER 10-8 MG/5ML PO SUER: 5.0000 mL | Freq: Two times a day (BID) | ORAL | 0 refills | Status: DC | PRN

## 2018-09-30 NOTE — Telephone Encounter (Signed)
Call from pt - c/o coughing-pain from back to front of chest (both sides), some shortness of breath, intermittent sweating which started Monday. States feels better moving around than lying down. No available appts this morning - given an appt for 1345 PM in [facility]. Instructed to go to ED/UC if condition worsens' pt verbalized understanding.

## 2018-09-30 NOTE — Progress Notes (Signed)
Internal Medicine Clinic Attending  Case discussed with Dr. Guilloud at the time of the visit.  We reviewed the resident's history and exam and pertinent patient test results.  I agree with the assessment, diagnosis, and plan of care documented in the resident's note.  

## 2018-09-30 NOTE — Progress Notes (Signed)
   CC: flu like symptoms  HPI:  [name] a 63 y.o. male with past medical history outlined below here for flu like symptoms. For the details of today's visit, please refer to the assessment and plan.  Past Medical History:  Diagnosis Date  . Asthma   . CAD (coronary artery disease)    stent RCA 2006 (other residual disease). /  nuclear 2008  no ischemia, inferolateral scar.  . Cataract   . Chronic bronchitis ([facility])    "anytime I get sick it goes into bronchitis; probably get it q yr"  . Daily headache    "just recently" (05/10/2014)  . Dyslipidemia   . Ejection fraction    EF 55%,echo, 2008, inferolateral hypokinesis,  . Erectile dysfunction   . Gastroparesis   . Hypertension   . Myocardial infarction ([facility]) 2006  . Severe obstructive sleep apnea-hypopnea syndrome 04/15/2009   Sleep study 03/15/2009 AHI of 82.2/hr oxygen desaturation nadir of 77%, loud snoring. Did not tolerate CPAP due to mask.  . Type 2 diabetes mellitus with complications ([facility]) dx'd 1972    Review of Systems  Constitutional: Positive for chills and fever.  HENT: Positive for congestion.   Respiratory: Positive for cough. Negative for shortness of breath and wheezing.   Gastrointestinal: Positive for nausea and vomiting. Negative for diarrhea.  Musculoskeletal: Positive for myalgias.    Physical Exam:  Vitals:   09/30/18 1438  BP: 109/63  Pulse: 85  Temp: 97.9 F (36.6 C)  TempSrc: Oral  SpO2: 96%  Weight: 221 lb 1.6 oz (100.3 kg)  Height: 6\' 10"  (2.083 m)    Constitutional: NAD, appears comfortable Cardiovascular: RRR, no m/r/g Pulmonary/Chest: CTAB, no wheezes, rales, or rhonchi.  Extremities: Warm and well perfused. No edema.  Psychiatric: Normal mood and affect  Assessment & Plan:   See Encounters Tab for problem based charting.  Patient discussed with Dr. Rogelia Boga

## 2018-09-30 NOTE — Assessment & Plan Note (Signed)
Patient is here with 2 days of flu-like symptoms.  He did receive his flu shot this year but has been in contact with multiple coworkers who have been out sick and tested positive for the flu.  He endorses symptoms of congestion, cough, diffuse body aches, chills, nausea, and one episode of emesis.  He also has chest and rib pain from coughing that is preventing him from sleeping.  Overall very suspicious for influenza infection.  Will treat empirically and defer testing given critically low flu swabs. -Tamiflu 75 twice daily x5 days - Tussionex 5 mL every 12 hours as needed

## 2018-09-30 NOTE — Patient Instructions (Signed)
Tyler Wilkinson,  I have given you a prescription for tamiflu and a cough medication with codeine. Please start the tamiflu tonight, take it twice a day for 5 days.   If you have any questions or concerns, call our clinic at [phone] or after hours call [phone] and ask for the internal medicine resident on call. Thank you!  Dr. Antony Contras

## 2018-10-15 ENCOUNTER — Ambulatory Visit (INDEPENDENT_AMBULATORY_CARE_PROVIDER_SITE_OTHER): Payer: BLUE CROSS/BLUE SHIELD | Admitting: Internal Medicine

## 2018-10-15 ENCOUNTER — Encounter: Payer: Self-pay | Admitting: Internal Medicine

## 2018-10-15 ENCOUNTER — Other Ambulatory Visit: Payer: Self-pay

## 2018-10-15 VITALS — BP 127/64 | HR 77 | Temp 97.5°F | Ht 73.0 in | Wt 218.0 lb

## 2018-10-15 DIAGNOSIS — I700 Atherosclerosis of aorta: Secondary | ICD-10-CM | POA: Diagnosis not present

## 2018-10-15 DIAGNOSIS — I10 Essential (primary) hypertension: Secondary | ICD-10-CM

## 2018-10-15 DIAGNOSIS — E1143 Type 2 diabetes mellitus with diabetic autonomic (poly)neuropathy: Secondary | ICD-10-CM | POA: Diagnosis not present

## 2018-10-15 DIAGNOSIS — F339 Major depressive disorder, recurrent, unspecified: Secondary | ICD-10-CM

## 2018-10-15 DIAGNOSIS — Z9641 Presence of insulin pump (external) (internal): Secondary | ICD-10-CM

## 2018-10-15 DIAGNOSIS — Z79899 Other long term (current) drug therapy: Secondary | ICD-10-CM

## 2018-10-15 DIAGNOSIS — Z794 Long term (current) use of insulin: Secondary | ICD-10-CM

## 2018-10-15 DIAGNOSIS — N529 Male erectile dysfunction, unspecified: Secondary | ICD-10-CM

## 2018-10-15 DIAGNOSIS — J302 Other seasonal allergic rhinitis: Secondary | ICD-10-CM

## 2018-10-15 DIAGNOSIS — N401 Enlarged prostate with lower urinary tract symptoms: Secondary | ICD-10-CM

## 2018-10-15 LAB — GLUCOSE, CAPILLARY: Glucose-Capillary: 182 mg/dL — ABNORMAL HIGH (ref 70–99)

## 2018-10-15 MED ORDER — TADALAFIL 20 MG PO TABS: 20.0000 mg | ORAL_TABLET | ORAL | 1 refills | Status: DC | PRN

## 2018-10-15 MED ORDER — ROSUVASTATIN CALCIUM 5 MG PO TABS: 5.0000 mg | ORAL_TABLET | Freq: Every day | ORAL | 3 refills | Status: DC

## 2018-10-15 MED ORDER — DULAGLUTIDE 0.75 MG/0.5ML ~~LOC~~ SOAJ: 0.7500 mg | SUBCUTANEOUS | 2 refills | Status: DC

## 2018-10-15 MED ORDER — BENAZEPRIL HCL 40 MG PO TABS: 40.0000 mg | ORAL_TABLET | Freq: Every day | ORAL | 3 refills | Status: DC

## 2018-10-15 MED ORDER — HYDROCHLOROTHIAZIDE 25 MG PO TABS: 25.0000 mg | ORAL_TABLET | Freq: Every day | ORAL | 3 refills | Status: DC

## 2018-10-15 MED ORDER — CARVEDILOL 6.25 MG PO TABS: 6.2500 mg | ORAL_TABLET | Freq: Two times a day (BID) | ORAL | 3 refills | Status: DC

## 2018-10-15 MED ORDER — LEVOCETIRIZINE DIHYDROCHLORIDE 5 MG PO TABS: 5.0000 mg | ORAL_TABLET | Freq: Every evening | ORAL | 3 refills | Status: DC

## 2018-10-15 MED ORDER — ALPRAZOLAM 0.5 MG PO TABS: ORAL_TABLET | ORAL | 2 refills | Status: DC

## 2018-10-16 ENCOUNTER — Encounter: Payer: Self-pay | Admitting: Internal Medicine

## 2018-10-16 LAB — CMP14 + ANION GAP
ALT: 78 IU/L — ABNORMAL HIGH (ref 0–44)
AST: 34 IU/L (ref 0–40)
Albumin/Globulin Ratio: 1.6 (ref 1.2–2.2)
Albumin: 3.9 g/dL (ref 3.8–4.8)
Alkaline Phosphatase: 106 IU/L (ref 39–117)
Anion Gap: 11 mmol/L (ref 10.0–18.0)
BILIRUBIN TOTAL: 0.2 mg/dL (ref 0.0–1.2)
BUN/Creatinine Ratio: 13 (ref 10–24)
BUN: 15 mg/dL (ref 8–27)
CHLORIDE: 106 mmol/L (ref 96–106)
CO2: 25 mmol/L (ref 20–29)
Calcium: 9.6 mg/dL (ref 8.6–10.2)
Creatinine, Ser: 1.14 mg/dL (ref 0.76–1.27)
GFR calc Af Amer: 79 mL/min/{1.73_m2} (ref >59)
GFR calc non Af Amer: 69 mL/min/{1.73_m2} (ref >59)
Globulin, Total: 2.5 g/dL (ref 1.5–4.5)
Glucose: 91 mg/dL (ref 65–99)
Potassium: 4.4 mmol/L (ref 3.5–5.2)
Sodium: 142 mmol/L (ref 134–144)
Total Protein: 6.4 g/dL (ref 6.0–8.5)

## 2018-10-16 LAB — CBC
Hematocrit: 41 % (ref 37.5–51.0)
Hemoglobin: 14.3 g/dL (ref 13.0–17.7)
MCH: 29.1 pg (ref 26.6–33.0)
MCHC: 34.9 g/dL (ref 31.5–35.7)
MCV: 84 fL (ref 79–97)
Platelets: 278 10*3/uL (ref 150–450)
RBC: 4.91 x10E6/uL (ref 4.14–5.80)
RDW: 12.6 % (ref 11.6–15.4)
WBC: 7 10*3/uL (ref 3.4–10.8)

## 2018-10-16 LAB — LIPID PANEL
Chol/HDL Ratio: 2.9 ratio (ref 0.0–5.0)
Cholesterol, Total: 111 mg/dL (ref 100–199)
HDL: 38 mg/dL — ABNORMAL LOW (ref >39)
LDL Calculated: 62 mg/dL (ref 0–99)
Triglycerides: 55 mg/dL (ref 0–149)
VLDL Cholesterol Cal: 11 mg/dL (ref 5–40)

## 2018-10-16 NOTE — Assessment & Plan Note (Signed)
HPI: At his last visit he had some changes to his blood pressure medications notably was instructed to discontinue hydrochlorothiazide and instructed to start amlodipine.  He was continued on benazepril and carvedilol.  He however reports a few days after that he went back to taking hydrochlorothiazide and stop the amlodipine.  He feels well with his previous regimen and his polyuria has improved greatly with Flomax.  Assessment essential hypertension at goal  Plan Remove amlodipine from medication list Continue hydrochlorothiazide 25 mg daily Continue benazepril 40 mg daily Continue carvedilol 6.25 twice daily

## 2018-10-16 NOTE — Assessment & Plan Note (Signed)
He is requesting refill of Cialis.  He reports that when taking Viagra or Cialis he is able to maintain an erection.  Main barrier is cost for him but he reports that he is interested in having a new prescription.  He understands that he cannot take Cialis with any nitrates for chest pain.

## 2018-10-16 NOTE — Assessment & Plan Note (Signed)
Chronic and stable Repeat lipid panel continue risk factor modification

## 2018-10-16 NOTE — Progress Notes (Signed)
Subjective:  HPI: Tyler Wilkinson is a 63 y.o. male who presents for f/u DM  Please see Assessment and Plan below for the status of his chronic medical problems.  Review of Systems: Review of Systems  Constitutional: Positive for malaise/fatigue. Negative for fever and weight loss.  Respiratory: Negative for cough.   Cardiovascular: Negative for chest pain and leg swelling.  Gastrointestinal: Negative for constipation.  Genitourinary: Negative for dysuria and frequency.  Musculoskeletal: Negative for myalgias.  Neurological: Negative for dizziness.  Psychiatric/Behavioral: Negative for depression, substance abuse and suicidal ideas. The patient does not have insomnia.     Objective:  Physical Exam: Vitals:   10/15/18 1017  BP: 127/64  Pulse: 77  Temp: (!) 97.5 F (36.4 C)  TempSrc: Oral  SpO2: 99%  Weight: 218 lb (98.9 kg)  Height: 6\' 1"  (1.854 m)   Body mass index is 28.76 kg/m. Physical Exam Vitals signs and nursing note reviewed.  Constitutional:      Appearance: Normal appearance.  Cardiovascular:     Rate and Rhythm: Normal rate and regular rhythm.  Pulmonary:     Effort: Pulmonary effort is normal.     Breath sounds: Normal breath sounds.  Musculoskeletal:     Right lower leg: No edema.     Left lower leg: No edema.    Assessment & Plan:  Essential hypertension HPI: At his last visit he had some changes to his blood pressure medications notably was instructed to discontinue hydrochlorothiazide and instructed to start amlodipine.  He was continued on benazepril and carvedilol.  He however reports a few days after that he went back to taking hydrochlorothiazide and stop the amlodipine.  He feels well with his previous regimen and his polyuria has improved greatly with Flomax.  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  He does admit that his sugars have run a little higher over the last few months.  At his last visit his medications were not changed but his A1c was up to 8.4%.  Assessment uncontrolled type 2 diabetes with peripheral neuropathy  Plan I discussed with him starting a GLP-1 agonist.  I think this will fit him given his cardiovascular risk.  However he does have a reported history of diabetic gastroparesis in my review this was several years ago and very mild.  I discussed with him that the GLP-1 may worsen gastroparesis and we will have to monitor for this.  He would like to have a once weekly GLP-1.  I believe the only 2 that have proven cardiovascular benefits are Ozempic and Trulicity however he is also concerned about diabetic retinopathy therefore I would like him to start Trulicity.  I will start him at the 0.75 mg weekly dose. I discussed with him starting this medication he may likely need to back down on his insulin I suggested going down to the [place] 30 pump however he wants to continue with 40 pump and see if his sugars improve first.  He will monitor his blood sugar frequently and let myself or Lupita Leash know if he has any hypoglycemia.  Major depression, recurrent, chronic (HCC) HPI: He has been taking 1 tablet of Wellbutrin daily.  He thinks his depression is currently well controlled he is bothered by sexual dysfunction and wonders if the Wellbutrin is contributing to this.  Assessment chronic major depression with anxiety  Plan I discussed with him that all antidepressants can have some sexual side effects.  Given that his depression is currently  stable he may come off Wellbutrin if he would like to and we will monitor. We will continue low-dose Xanax once daily as needed.  ERECTILE DYSFUNCTION, ORGANIC He is requesting refill of Cialis.  He reports that when taking Viagra or Cialis he is able to maintain an erection.  Main barrier is cost for him but he reports that he is interested in having a new prescription.  He understands that he cannot take Cialis with any nitrates for chest pain.  Benign localized prostatic hyperplasia with lower urinary tract symptoms (LUTS) HPI: Since his last visit he has been taking Flomax 0.4 mg daily he reports that this is dramatically improved his urinary symptoms.  He is now able to urinate completely and with less frequency.  He is no other issues  Assessment BPH with LUTS  Plan Continue Flomax 0.4 mg daily   Medications Ordered Meds ordered this encounter  Medications  . Dulaglutide (TRULICITY) 0.75 MG/0.5ML SOPN    Sig: Inject 0.75 mg into the skin once a week.    Dispense:  4 pen    Refill:  2  . rosuvastatin (CRESTOR) 5 MG tablet    Sig: Take 1 tablet (5 mg total) by mouth daily.    Dispense:  90 tablet    Refill:  3  . levocetirizine (XYZAL) 5 MG tablet    Sig: Take 1 tablet (5 mg total) by mouth every evening.    Dispense:  90 tablet    Refill:  3  . carvedilol (COREG) 6.25 MG tablet    Sig: Take 1 tablet (6.25 mg total) by mouth 2 (two) times daily.    Dispense:  180 tablet    Refill:  3  . tadalafil (ADCIRCA/CIALIS) 20 MG tablet    Sig: Take 1 tablet (20 mg total) by mouth as needed for erectile dysfunction.    Dispense:  20 tablet    Refill:  1  . benazepril (LOTENSIN) 40 MG tablet    Sig: Take 1 tablet (40 mg total) by mouth daily.    Dispense:  90 tablet    Refill:  3  . hydrochlorothiazide (HYDRODIURIL) 25 MG tablet    Sig: Take 1 tablet (25 mg total) by mouth daily.    Dispense:  90 tablet    Refill:  3  . ALPRAZolam (XANAX) 0.5 MG tablet    Sig: TAKE 1 TABLET BY  MOUTH AT BEDTIME AS NEEDED FOR ANXIETY OR SLEEP    Dispense:  30 tablet    Refill:  2   Other Orders Orders Placed This Encounter  Procedures  . Glucose, capillary  . Lipid Profile  . CMP14 + Anion Gap  . CBC no Diff   Follow Up: Return in about 3 months (around 01/13/2019).

## 2018-10-16 NOTE — Assessment & Plan Note (Signed)
HPI: Since his last visit he has been taking Flomax 0.4 mg daily he reports that this is dramatically improved his urinary symptoms.  He is now able to urinate completely and with less frequency.  He is no other issues  Assessment BPH with LUTS  Plan Continue Flomax 0.4 mg daily

## 2018-10-16 NOTE — Assessment & Plan Note (Signed)
HPI: He remains on Vigo 40 he does not have any current complaints with this he overall does feel chronically fatigued but no polyuria or polydipsia.  He does admit that his sugars have run a little higher over the last few months.  At his last visit his medications were not changed but his A1c was up to 8.4%.  Assessment uncontrolled type 2 diabetes with peripheral neuropathy  Plan I discussed with him starting a GLP-1 agonist.  I think this will fit him given his cardiovascular risk.  However he does have a reported history of diabetic gastroparesis in my review this was several years ago and very mild.  I discussed with him that the GLP-1 may worsen gastroparesis and we will have to monitor for this.  He would like to have a once weekly GLP-1.  I believe the only 2 that have proven cardiovascular benefits are Ozempic and Trulicity however he is also concerned about diabetic retinopathy therefore I would like him to start Trulicity.  I will start him at the 0.75 mg weekly dose. I discussed with him starting this medication he may likely need to back down on his insulin I suggested going down to the [place] 30 pump however he wants to continue with 40 pump and see if his sugars improve first.  He will monitor his blood sugar frequently and let myself or Lupita Leash know if he has any hypoglycemia.

## 2018-10-16 NOTE — Assessment & Plan Note (Addendum)
HPI: He has been taking 1 tablet of Wellbutrin daily.  He thinks his depression is currently well controlled he is bothered by sexual dysfunction and wonders if the Wellbutrin is contributing to this.  Assessment chronic major depression with anxiety  Plan I discussed with him that all antidepressants can have some sexual side effects.  Given that his depression is currently stable he may come off Wellbutrin if he would like to and we will monitor. We will continue low-dose Xanax once daily as needed.

## 2018-10-26 ENCOUNTER — Other Ambulatory Visit: Payer: Self-pay | Admitting: Dietician

## 2018-10-26 DIAGNOSIS — E1143 Type 2 diabetes mellitus with diabetic autonomic (poly)neuropathy: Secondary | ICD-10-CM

## 2018-10-26 NOTE — Telephone Encounter (Addendum)
Called for VGo copay card. He asked that it be sent to send to [facility] on [address]. Says he started Trulicity yesterday. Encouraged him to bring his cgm in for download after about 20 days on Trulicity.  Card has to be activated by patient only. Called Tyler Wilkinson and gave him to toll free number.

## 2018-10-27 ENCOUNTER — Telehealth: Payer: Self-pay | Admitting: Dietician

## 2018-10-27 NOTE — Telephone Encounter (Signed)
Called to say he likes Trulicity and that it is helping lower his blood sugar. His blood sugars are staying 150-200mg /dl. He agreed to come in next Friday to let us download his CGM reader.

## 2018-11-19 ENCOUNTER — Telehealth: Payer: Self-pay | Admitting: Dietician

## 2018-11-20 NOTE — Telephone Encounter (Signed)
Wanted to know if we are open. Reports blood sugar averages on 170-190 average. Support provided. He is tolerating Trulicity. It is not affecting his appetite.

## 2018-12-29 ENCOUNTER — Telehealth: Payer: Self-pay | Admitting: Dietician

## 2018-12-29 DIAGNOSIS — R6889 Other general symptoms and signs: Secondary | ICD-10-CM

## 2018-12-29 DIAGNOSIS — E1143 Type 2 diabetes mellitus with diabetic autonomic (poly)neuropathy: Secondary | ICD-10-CM

## 2018-12-29 NOTE — Telephone Encounter (Signed)
Mr. Grgurich requests covid-19 antibody test. He says it costs 10.00 and his insurance will pay for it at [facility]. He does not have any symptoms, he wonders if that is what he had when he was sick. He would feel more comfortable knowing if he had it or not. He will try to find out more if needed.

## 2018-12-30 ENCOUNTER — Other Ambulatory Visit: Payer: Self-pay | Admitting: *Deleted

## 2018-12-30 DIAGNOSIS — E1143 Type 2 diabetes mellitus with diabetic autonomic (poly)neuropathy: Secondary | ICD-10-CM

## 2018-12-30 DIAGNOSIS — R6889 Other general symptoms and signs: Secondary | ICD-10-CM

## 2018-12-30 NOTE — Telephone Encounter (Signed)
Patient notified.  He is tolerating Trulicity and thinks it is helping some, he is for asking for your opinion about increasing the dose. (His friend is on 1.5mg  daily)

## 2018-12-30 NOTE — Telephone Encounter (Signed)
Would likely be fine to do, but if we are getting the A1c we might as well wait till that returns to increase the dose.

## 2018-12-30 NOTE — Telephone Encounter (Signed)
Lupita Leash, I have ordered the test, he may go to any [facility] site and have his blood drawn for the test.  Since I have not seen him recently I will also have them obtain an A1c  French Ana and Larene Beach could you see that the orders go through properly.

## 2018-12-31 ENCOUNTER — Telehealth (INDEPENDENT_AMBULATORY_CARE_PROVIDER_SITE_OTHER): Payer: BLUE CROSS/BLUE SHIELD | Admitting: Internal Medicine

## 2018-12-31 DIAGNOSIS — E1143 Type 2 diabetes mellitus with diabetic autonomic (poly)neuropathy: Secondary | ICD-10-CM | POA: Diagnosis not present

## 2018-12-31 DIAGNOSIS — Z7189 Other specified counseling: Secondary | ICD-10-CM

## 2018-12-31 DIAGNOSIS — Z794 Long term (current) use of insulin: Secondary | ICD-10-CM

## 2018-12-31 LAB — HEMOGLOBIN A1C
Est. average glucose Bld gHb Est-mCnc: 189 mg/dL
Hgb A1c MFr Bld: 8.2 % — ABNORMAL HIGH (ref 4.8–5.6)

## 2018-12-31 LAB — SAR COV2 SEROLOGY (COVID19)AB(IGG),IA: SARS-CoV-2 Ab, IgG: NEGATIVE

## 2019-01-04 DIAGNOSIS — Z7189 Other specified counseling: Secondary | ICD-10-CM | POA: Insufficient documentation

## 2019-01-04 HISTORY — DX: Other specified counseling: Z71.89

## 2019-01-04 MED ORDER — DULAGLUTIDE 1.5 MG/0.5ML ~~LOC~~ SOAJ: 1.5000 mg | SUBCUTANEOUS | 5 refills | Status: DC

## 2019-01-04 NOTE — Assessment & Plan Note (Signed)
HPI: Patient reports that during the coronavirus stay at home he has been doing a little bit better with eating healthier.  He reports that his 30-day glucose average is closer to 150 whereas the 90-day average is closer to 180.  He denies any polyuria or polydipsia.  He reports to me that he thinks that Trulicity has helped greatly especially blunting postprandial hyperglycemia.  He continues with the [place] 40 device.  Assessment type 2 diabetes with peripheral neuropathy  Plan We discussed his repeat A1c is 8.2 still some room to go however it sounds like his sugars are trending in the right direction.  I will have him increase his Trulicity to 1.5 mg weekly he will let me know if he has any additional GI side effects at this dose.

## 2019-01-04 NOTE — Progress Notes (Signed)
  [facility] Internal Medicine Residency Telephone Encounter Continuity Care Appointment  HPI:   This telephone encounter was created for [name] on 01/04/2019 for the following purpose/cc COVID 19 concern, diabetes.   Past Medical History:  Past Medical History:  Diagnosis Date  . Asthma   . CAD (coronary artery disease)    stent RCA 2006 (other residual disease). /  nuclear 2008  no ischemia, inferolateral scar.  . Cataract   . Chronic bronchitis (HCC)    "anytime I get sick it goes into bronchitis; probably get it q yr"  . Daily headache    "just recently" (05/10/2014)  . Dyslipidemia   . Ejection fraction    EF 55%,echo, 2008, inferolateral hypokinesis,  . Erectile dysfunction   . Gastroparesis   . Hypertension   . Myocardial infarction (HCC) 2006  . Severe obstructive sleep apnea-hypopnea syndrome 04/15/2009   Sleep study 03/15/2009 AHI of 82.2/hr oxygen desaturation nadir of 77%, loud snoring. Did not tolerate CPAP due to mask.  . Type 2 diabetes mellitus with complications (HCC) dx'd 1972      ROS:  Review of Systems  Constitutional: Negative for chills, fever and malaise/fatigue.  Genitourinary: Negative for dysuria and frequency.  Neurological: Negative for dizziness.       Assessment / Plan / Recommendations:   Please see A&P under problem oriented charting for assessment of the patient's acute and chronic medical conditions.   As always, pt is advised that if symptoms worsen or new symptoms arise, they should go to an urgent care facility or to to ER for further evaluation.   Consent and Medical Decision Making:    This is a telephone encounter between [name] and Gust Rung on 01/04/2019 for diabetes. The visit was conducted with the patient located at home and Gust Rung at [facility]. The patient's identity was confirmed using their DOB and current address. The patient has consented to being evaluated through a telephone encounter and  understands the associated risks (an examination cannot be done and the patient may need to come in for an appointment) / benefits (allows the patient to remain at home, decreasing exposure to coronavirus). I personally spent 14 minutes on medical discussion.

## 2019-01-04 NOTE — Assessment & Plan Note (Signed)
HPI: Back in February he had an influenza-like illness.  Due to the symptoms and suspected prevalence of influenza versus coronavirus he was suspected to have influenza that he was not tested.  He made a full recovery after this however he became concerned recently that he could have possibly had coronavirus at that time.  He requested the coronavirus antibody test.  I ordered this 2 days ago and it has returned negative.  Assessment education about coronavirus COVID-19  Plan We discussed that the antibodies were negative the significance of this.  We discussed proper hygiene and strategies to avoid coronavirus infection given his diabetes.

## 2019-01-05 ENCOUNTER — Telehealth: Payer: Self-pay | Admitting: Dietician

## 2019-01-05 NOTE — Telephone Encounter (Signed)
Has not started increased dose of Trulicity. Wanted to discuss possible side effects of the increased dose. After our discussion, he said he plans to try the increased dose for the next month.

## 2019-01-07 ENCOUNTER — Other Ambulatory Visit: Payer: Self-pay | Admitting: Dietician

## 2019-01-07 DIAGNOSIS — E1143 Type 2 diabetes mellitus with diabetic autonomic (poly)neuropathy: Secondary | ICD-10-CM

## 2019-01-07 NOTE — Addendum Note (Signed)
Addended by: Baird Cancer on: 01/07/2019 02:32 PM   Modules accepted: Orders

## 2019-01-07 NOTE — Telephone Encounter (Signed)
Requested refill on Vgo40. Sample to be left at front desk as he is almost out.

## 2019-01-08 ENCOUNTER — Other Ambulatory Visit: Payer: Self-pay | Admitting: *Deleted

## 2019-01-08 DIAGNOSIS — E1143 Type 2 diabetes mellitus with diabetic autonomic (poly)neuropathy: Secondary | ICD-10-CM

## 2019-01-08 MED ORDER — V-GO 40 KIT: 1.0000 [IU] | PACK | Freq: Every morning | 6 refills | Status: DC

## 2019-01-08 NOTE — Telephone Encounter (Signed)
Requesting to speak with a nurse about Vgo40. Please call back.

## 2019-01-13 MED ORDER — V-GO 40 KIT: 1.0000 [IU] | PACK | Freq: Every morning | 11 refills | Status: DC

## 2019-01-15 DIAGNOSIS — G4733 Obstructive sleep apnea (adult) (pediatric): Secondary | ICD-10-CM | POA: Diagnosis not present

## 2019-01-21 ENCOUNTER — Telehealth: Payer: Self-pay | Admitting: Dietician

## 2019-01-21 NOTE — Telephone Encounter (Signed)
Tyler Wilkinson called to let us know the increaesed Trulicty dose is controlling blood sugars well. He has not had a blood sugar more than 200mg /dl since started  on Sunday. However, he is feeling a bit nauseated. He plans to stay on this dose for the month and agrees to let us know if the nausea gets worse.

## 2019-01-21 NOTE — Telephone Encounter (Signed)
Ok thank you 

## 2019-01-26 ENCOUNTER — Other Ambulatory Visit: Payer: Self-pay | Admitting: *Deleted

## 2019-01-26 MED ORDER — FREESTYLE LIBRE 14 DAY SENSOR MISC: 1.0000 | Freq: Every day | 6 refills | Status: DC

## 2019-02-04 DIAGNOSIS — Z794 Long term (current) use of insulin: Secondary | ICD-10-CM | POA: Diagnosis not present

## 2019-02-04 DIAGNOSIS — G4733 Obstructive sleep apnea (adult) (pediatric): Secondary | ICD-10-CM | POA: Diagnosis not present

## 2019-02-04 DIAGNOSIS — E118 Type 2 diabetes mellitus with unspecified complications: Secondary | ICD-10-CM | POA: Diagnosis not present

## 2019-02-05 DIAGNOSIS — E119 Type 2 diabetes mellitus without complications: Secondary | ICD-10-CM | POA: Diagnosis not present

## 2019-02-05 DIAGNOSIS — H02423 Myogenic ptosis of bilateral eyelids: Secondary | ICD-10-CM | POA: Diagnosis not present

## 2019-02-05 DIAGNOSIS — H40053 Ocular hypertension, bilateral: Secondary | ICD-10-CM | POA: Diagnosis not present

## 2019-02-05 DIAGNOSIS — H524 Presbyopia: Secondary | ICD-10-CM | POA: Diagnosis not present

## 2019-02-05 LAB — HM DIABETES EYE EXAM

## 2019-03-25 ENCOUNTER — Telehealth: Payer: Self-pay | Admitting: Dietician

## 2019-03-25 NOTE — Telephone Encounter (Addendum)
Tyler Wilkinson calls stating that his blood sugars were high starting yesterday, for no reason, has taken extra insulin for past 24 hours. Blood sugar yesterday were: 425/390/429/422/421/418/414/379/376/331/356/231 Fasting this am was 221;  Blood sugar 292 now after eating and taking 20 units by injection 45-60 minutes ago.He reports his average glucose for 90 days was in the 170s until yesterday Wonders if his insulin froze (was next to milk that froze)  and is not working. Just got refill last week so does not think his insurance will cover another refill yet.. Denies symptoms of  Illness or infection. Changing Vgo now- will call back for samples tomorrow if his blood sugar doe snot improve with a new VGo. Encouraged him to drink more water (not soda or other sugar sweetened beverages)

## 2019-03-26 NOTE — Telephone Encounter (Addendum)
Tyler Wilkinson calls to update Tyler Wilkinson on his blood sugars. He used a different insulin vial yesterday afternoon and got his blood sugar down to 80 before he went to work. H reports that his sugar stayed  under 200 all day until he got home it was the highest at 228. He requests 2 vials of insulin to replace the ones that got frozen. He agreed to make an appointment with Dr. Heber [place] and myself and let upload his CGM when he comes in.Marland Kitchen

## 2019-04-05 ENCOUNTER — Other Ambulatory Visit: Payer: Self-pay | Admitting: Internal Medicine

## 2019-04-05 DIAGNOSIS — R3914 Feeling of incomplete bladder emptying: Secondary | ICD-10-CM

## 2019-04-05 DIAGNOSIS — N401 Enlarged prostate with lower urinary tract symptoms: Secondary | ICD-10-CM

## 2019-04-06 ENCOUNTER — Other Ambulatory Visit: Payer: Self-pay | Admitting: *Deleted

## 2019-04-06 DIAGNOSIS — N401 Enlarged prostate with lower urinary tract symptoms: Secondary | ICD-10-CM

## 2019-04-07 ENCOUNTER — Other Ambulatory Visit: Payer: Self-pay | Admitting: *Deleted

## 2019-04-07 MED ORDER — TAMSULOSIN HCL 0.4 MG PO CAPS: 0.4000 mg | ORAL_CAPSULE | Freq: Every day | ORAL | 1 refills | Status: DC

## 2019-04-07 NOTE — Telephone Encounter (Signed)
Next appt scheduled  9/24 with PCP. 

## 2019-04-08 DIAGNOSIS — G4733 Obstructive sleep apnea (adult) (pediatric): Secondary | ICD-10-CM | POA: Diagnosis not present

## 2019-04-08 MED ORDER — ALPRAZOLAM 0.5 MG PO TABS: ORAL_TABLET | ORAL | 2 refills | Status: DC

## 2019-05-07 ENCOUNTER — Encounter: Payer: Self-pay | Admitting: *Deleted

## 2019-05-10 ENCOUNTER — Telehealth: Payer: Self-pay | Admitting: Dietician

## 2019-05-10 NOTE — Telephone Encounter (Signed)
Looks like his next appointment with Dr. Heber [place] is 12/3. I would have him schedule a follow up appointment sooner in [facility] for his hypoglycemia. Thank you!

## 2019-05-10 NOTE — Telephone Encounter (Signed)
Patient called to find out when his appointment with Dr. Heber [place] is. He also mentioned that he has gotten his average down to 179 x 90 days. He is hoping for a lower A1C next time it is done. He did mention that he has had a couple of low blood sugars in the 40s and 50s.

## 2019-05-13 ENCOUNTER — Ambulatory Visit: Payer: BLUE CROSS/BLUE SHIELD | Admitting: Internal Medicine

## 2019-05-13 NOTE — Telephone Encounter (Signed)
Called and informed patient of advice to schedule an appointment for evaluation of hypoglycemia. He verbalized understanding and scheduled an appointment me for 05/21/19 and wants to keep his appointment with Dr Heber [place] in December.  He says he knows he is giving too many clicks at night afterwork at times(he is trying to lower his average glucose to below 180mg /dl) . I suggested the [facility] 2 with high and low alarms.

## 2019-05-14 ENCOUNTER — Other Ambulatory Visit: Payer: Self-pay | Admitting: Dietician

## 2019-05-14 DIAGNOSIS — E1143 Type 2 diabetes mellitus with diabetic autonomic (poly)neuropathy: Secondary | ICD-10-CM

## 2019-05-14 MED ORDER — DEXCOM G6 SENSOR MISC: 1.0000 | Freq: Every day | 12 refills | Status: DC

## 2019-05-14 MED ORDER — DEXCOM G6 RECEIVER DEVI: 1.0000 | Freq: Every day | 0 refills | Status: DC

## 2019-05-14 MED ORDER — DEXCOM G6 TRANSMITTER MISC: 1.0000 | Freq: Every day | 3 refills | Status: DC

## 2019-05-14 NOTE — Telephone Encounter (Signed)
His insurance no longer covers the freestyle libre. They cover Dexcom. He requests a prescription.

## 2019-05-21 ENCOUNTER — Ambulatory Visit (INDEPENDENT_AMBULATORY_CARE_PROVIDER_SITE_OTHER): Payer: Self-pay | Admitting: Dietician

## 2019-05-21 ENCOUNTER — Other Ambulatory Visit: Payer: Self-pay

## 2019-05-21 VITALS — Wt 228.7 lb

## 2019-05-21 DIAGNOSIS — E1143 Type 2 diabetes mellitus with diabetic autonomic (poly)neuropathy: Secondary | ICD-10-CM

## 2019-05-21 LAB — POCT GLYCOSYLATED HEMOGLOBIN (HGB A1C): Hemoglobin A1C: 7.6 % — AB (ref 4.0–5.6)

## 2019-05-21 LAB — GLUCOSE, CAPILLARY: Glucose-Capillary: 130 mg/dL — ABNORMAL HIGH (ref 70–99)

## 2019-05-21 NOTE — Progress Notes (Addendum)
  Dexcom G6 Personal CGM Training  Start time:1135    End time: 9826 Total time: [place] was educated about the following:  -Getting to know device    (Receiver not brought today to be programmed ) -Setting up device   -Inserting sensor  -Calibrating- none required for G6 -Ending sensor session  Mr. Daino presented today for CGM education but did not bring his CGM items today. He is still using the [facility] and feels the dexcom is too complicated. He is unsure about being able to operate it successfully. He also reports he has not been getting much sleep and has not been using his CPAP. Today's visit was rescheduled to October 23.    Patient requested his A1C be checked today Lab Results  Component Value Date   HGBA1C 7.6 (A) 05/21/2019   HGBA1C 8.2 (H) 12/30/2018   HGBA1C 8.5 (A) 08/28/2018   HGBA1C 7.9 (A) 03/26/2018   HGBA1C 7.9 10/16/2017    CGM Results from [facility] personal  Average is  148  for 14 days Glucose management indicator 6.9 % Time in range (70-180 mg/dL): 69 % (Goal >70%) Time High (181-250 mg/dL) 21 % (Goal < 25%) Time Very High (>250 mg/dl) 5 % (Goal < 5%) Time Low (54-69 mg/dL): 4 % (Goal is <4%) Time Very Low (<54) 1%  (Goal <1%)  Coefficient of variation:37.4% (Goal is <36%)  Mr. Razzano was educated about the CGM report and results.   Debera Lat, RD 05/21/2019 11:35 AM.

## 2019-05-27 ENCOUNTER — Ambulatory Visit: Payer: BLUE CROSS/BLUE SHIELD | Admitting: Internal Medicine

## 2019-06-01 ENCOUNTER — Other Ambulatory Visit: Payer: Self-pay | Admitting: *Deleted

## 2019-06-01 MED ORDER — TADALAFIL 20 MG PO TABS: 20.0000 mg | ORAL_TABLET | ORAL | 1 refills | Status: DC | PRN

## 2019-06-01 NOTE — Telephone Encounter (Signed)
Next appt scheduled 1/23 with PCP. 

## 2019-06-01 NOTE — Addendum Note (Signed)
Addended by: Lalla Brothers T on: 06/01/2019 04:56 PM   Modules accepted: Orders

## 2019-06-11 ENCOUNTER — Telehealth: Payer: Self-pay | Admitting: Dietician

## 2019-06-11 ENCOUNTER — Ambulatory Visit: Payer: Self-pay | Admitting: Dietician

## 2019-06-11 NOTE — Telephone Encounter (Signed)
Patient was not able to come in for his appointment today, but we started set up of his Dexcom CGM over the phone. We could not complete it because he does not have the sensors. He plans to pick them up with weekend and come into the office Monday to complete set up and start using it.

## 2019-06-14 ENCOUNTER — Ambulatory Visit: Payer: Self-pay | Admitting: Dietician

## 2019-06-16 ENCOUNTER — Telehealth: Payer: Self-pay | Admitting: *Deleted

## 2019-06-16 NOTE — Telephone Encounter (Signed)
Fax from [place] sensor " this medication can not be ordered. Manufacturer is not supplying".

## 2019-06-17 NOTE — Telephone Encounter (Signed)
Called [facility] about Dexcom sensors-  3 sensors for $49.39 are ready for pick up now. They were on back order.

## 2019-06-17 NOTE — Telephone Encounter (Signed)
Patient notified that sensors are ready for pick up.

## 2019-06-22 ENCOUNTER — Other Ambulatory Visit: Payer: Self-pay | Admitting: *Deleted

## 2019-06-23 MED ORDER — INSULIN ASPART 100 UNIT/ML ~~LOC~~ SOLN: SUBCUTANEOUS | 11 refills | Status: DC

## 2019-06-24 ENCOUNTER — Telehealth: Payer: Self-pay | Admitting: Dietician

## 2019-06-24 NOTE — Telephone Encounter (Signed)
Left voicemail for return call  

## 2019-06-25 ENCOUNTER — Ambulatory Visit: Payer: Self-pay | Admitting: Dietician

## 2019-06-28 ENCOUNTER — Telehealth: Payer: Self-pay | Admitting: *Deleted

## 2019-06-28 NOTE — Telephone Encounter (Signed)
Information sent through CoverMyMeds for PA for Novolog Insulin after call to [facility] where patient has [employer].  PA for Novolog Insulin.  Approved  06/28/2019 thru 06/26/2022.  Called to [facility] with approval.  Patient has a $70 copay will use discount card to lower cost.  Sander Nephew, RN 06/28/2019 12;23 PM.

## 2019-07-02 ENCOUNTER — Telehealth: Payer: Self-pay | Admitting: Dietician

## 2019-07-02 NOTE — Telephone Encounter (Signed)
Left voicemail for return call  

## 2019-07-05 ENCOUNTER — Telehealth: Payer: Self-pay | Admitting: Dietician

## 2019-07-05 NOTE — Telephone Encounter (Signed)
Max dose of flomax is 2 tablets, if he feels he is needing 3 tablets I may need to check his urine or consider adding another medication (schedule visit with me or in [facility]).   Butch Penny are you able to relay this to him?

## 2019-07-05 NOTE — Telephone Encounter (Signed)
I misunderstood his message. He is taking 1 Flomax pill a day and wants to know if he can take 2 pills a day when it is acting up. He will see you December 3rd.

## 2019-07-05 NOTE — Telephone Encounter (Signed)
[name] reports that his Tyler Wilkinson CGM is performing about the same as his Tyler Wilkinson. Unfortunately his first sensor stopped working on his 8th day. He also asks that I give a message to Dr. Heber [place] about his Flomax. He wants permission to take 3 tablets when his prostate is really acting up like it was last week.

## 2019-07-07 NOTE — Telephone Encounter (Signed)
Patient notified

## 2019-07-07 NOTE — Telephone Encounter (Signed)
Yes two pills would be fine

## 2019-07-13 ENCOUNTER — Other Ambulatory Visit: Payer: Self-pay | Admitting: *Deleted

## 2019-07-13 MED ORDER — TRULICITY 1.5 MG/0.5ML ~~LOC~~ SOAJ: 1.5000 mg | SUBCUTANEOUS | 5 refills | Status: DC

## 2019-07-19 ENCOUNTER — Other Ambulatory Visit: Payer: Self-pay

## 2019-07-19 ENCOUNTER — Telehealth: Payer: Self-pay | Admitting: Dietician

## 2019-07-19 NOTE — Telephone Encounter (Signed)
Tyler Wilkinson reports he had a bad transmitter for his new Continuous glucose monitor that was replaced by the company.  He wants to know about rechecking his A1C sooner than 3 months. I suggested he continue to follow his time in range and average 90 day glucose on his cgm. Will assist him in learning how to do this with new cgm.

## 2019-07-20 MED ORDER — ALPRAZOLAM 0.5 MG PO TABS: ORAL_TABLET | ORAL | 2 refills | Status: DC

## 2019-07-22 ENCOUNTER — Other Ambulatory Visit: Payer: Self-pay

## 2019-07-22 ENCOUNTER — Ambulatory Visit (INDEPENDENT_AMBULATORY_CARE_PROVIDER_SITE_OTHER): Payer: BC Managed Care – PPO | Admitting: Internal Medicine

## 2019-07-22 ENCOUNTER — Encounter: Payer: Self-pay | Admitting: Internal Medicine

## 2019-07-22 VITALS — BP 117/75 | HR 86 | Temp 98.0°F | Ht 72.0 in | Wt 221.6 lb

## 2019-07-22 DIAGNOSIS — I10 Essential (primary) hypertension: Secondary | ICD-10-CM | POA: Diagnosis not present

## 2019-07-22 DIAGNOSIS — E1143 Type 2 diabetes mellitus with diabetic autonomic (poly)neuropathy: Secondary | ICD-10-CM | POA: Diagnosis not present

## 2019-07-22 DIAGNOSIS — R350 Frequency of micturition: Secondary | ICD-10-CM

## 2019-07-22 DIAGNOSIS — Z79899 Other long term (current) drug therapy: Secondary | ICD-10-CM

## 2019-07-22 DIAGNOSIS — Z794 Long term (current) use of insulin: Secondary | ICD-10-CM

## 2019-07-22 DIAGNOSIS — N401 Enlarged prostate with lower urinary tract symptoms: Secondary | ICD-10-CM

## 2019-07-22 DIAGNOSIS — J069 Acute upper respiratory infection, unspecified: Secondary | ICD-10-CM

## 2019-07-22 DIAGNOSIS — Z9641 Presence of insulin pump (external) (internal): Secondary | ICD-10-CM

## 2019-07-22 LAB — GLUCOSE, CAPILLARY: Glucose-Capillary: 190 mg/dL — ABNORMAL HIGH (ref 70–99)

## 2019-07-22 NOTE — Assessment & Plan Note (Signed)
HPI: Notes furinary frequency has increased lately, his dexcom sensor has malfunctioned and he is not sure how well his glucose has been, thinks in the 200s, last A1c was 7.6 2 months ago.  Has been taking 0.4mg  of flomax, was helping more now less so and has occasionally taken 0.8mg  without much additional benefit  A: Urinary frequency in setting of BPH  P: Continue flowmax at 0.4mg , I suspect his increased frequency is due to glucosuria and needs better DM control.  Will check a U/A

## 2019-07-22 NOTE — Patient Instructions (Signed)
I will call with results, please remain in isolation until I call to give you test results.

## 2019-07-22 NOTE — Progress Notes (Signed)
  Subjective:  HPI: [name] a 63 y.o. male who presents for cough  Runny nose since thanksgiving. Dry cough started Tuesday. Using flonase.  Chest pressure, under lungs/back associated with cough. No fevers. No changes in smell or taste. Daughter was sick 5 days prior to his start of symptoms she was reportedly tested for COVID19 and negative.  Still having some urinary frequency does not feel flomax is as helpful anymore.  Please see Assessment and Plan below for the status of his chronic medical problems.   Review of Systems: Review of Systems  Constitutional: Negative for chills, fever and weight loss.  HENT: Positive for congestion. Negative for sinus pain and sore throat.   Eyes: Negative for blurred vision.  Respiratory: Positive for cough. Negative for sputum production, shortness of breath and wheezing.   Cardiovascular: Negative for chest pain.  Musculoskeletal: Negative for myalgias.  Neurological: Negative for dizziness.  Psychiatric/Behavioral: Negative for depression.    Objective:  Physical Exam: Vitals:   07/22/19 1111  BP: 117/75  Pulse: 86  Temp: 98 F (36.7 C)  TempSrc: Oral  SpO2: 99%  Weight: 221 lb 9.6 oz (100.5 kg)  Height: 6' (1.829 m)   Body mass index is 30.05 kg/m. Physical Exam Vitals signs and nursing note reviewed.  Constitutional:      Appearance: Normal appearance.  Pulmonary:     Effort: Pulmonary effort is normal.     Breath sounds: Normal breath sounds.  Neurological:     Mental Status: He is alert.    Assessment & Plan:  See Encounters Tab for problem based charting.  Medications Ordered No orders of the defined types were placed in this encounter.  Other Orders Orders Placed This Encounter  Procedures  . Novel Coronavirus, NAA ([facility])    Order Specific Question:   Is this test for diagnosis or screening    Answer:   Diagnosis of ill patient    Order Specific Question:   Symptomatic for COVID-19 as  defined by CDC    Answer:   Yes    Order Specific Question:   Date of Symptom Onset    Answer:   07/15/2019    Order Specific Question:   Hospitalized for COVID-19    Answer:   No    Order Specific Question:   Admitted to ICU for COVID-19    Answer:   No    Order Specific Question:   Previously tested for COVID-19    Answer:   No    Order Specific Question:   Resident in a congregate (group) care setting    Answer:   No    Order Specific Question:   Is the patient student?    Answer:   No    Order Specific Question:   Employed in healthcare setting    Answer:   No  . Glucose, capillary  . Urinalysis, Reflex Microscopic   Follow Up: Return in about 2 months (around 09/22/2019).

## 2019-07-22 NOTE — Assessment & Plan Note (Signed)
A: URI  P: Given the COVID-19 pandemic will test for SARS COV 2.  Given isolation precautions until that test returns. Otherwise by reprot this may be non COVID URI that his daughter had, advised supportive care.

## 2019-07-23 LAB — URINALYSIS, ROUTINE W REFLEX MICROSCOPIC
Bilirubin, UA: NEGATIVE
Glucose, UA: NEGATIVE
Ketones, UA: NEGATIVE
Leukocytes,UA: NEGATIVE
Nitrite, UA: NEGATIVE
Protein,UA: NEGATIVE
RBC, UA: NEGATIVE
Specific Gravity, UA: 1.013 (ref 1.005–1.030)
Urobilinogen, Ur: 0.2 mg/dL (ref 0.2–1.0)
pH, UA: 5 (ref 5.0–7.5)

## 2019-07-26 ENCOUNTER — Telehealth: Payer: Self-pay | Admitting: Internal Medicine

## 2019-07-26 LAB — NOVEL CORONAVIRUS, NAA: SARS-CoV-2, NAA: NOT DETECTED

## 2019-07-26 NOTE — Telephone Encounter (Signed)
I had called, called back to inform him of the negative COVID test and normal UA

## 2019-07-26 NOTE — Telephone Encounter (Signed)
Pt is returning a call, unsure who called, pls return [phone]

## 2019-07-27 NOTE — Assessment & Plan Note (Signed)
HPI: Reports his CBG sensor has not been working propqerly, reporting sugars in the 200s, increased urinary frequency.  Feels he can do better with his diet.  occaisonally having some nausea- discussed this is a common side effect of trulicity  A: Type 2 DM with peripheral neuropathy.  P: Continue VGo-40, discussed diet changes Continue Trulicity 1.5mg  weekly, monitor nausea, overall disucssed that I think it would be benefitial to continue at same dose if tolerable.

## 2019-07-27 NOTE — Assessment & Plan Note (Signed)
HPI: No issues reports doing well.  Assessment essential hypertension at goal  Plan Continue hydrochlorothiazide 25 mg daily Continue benazepril 40 mg daily Continue carvedilol 6.25 twice daily

## 2019-07-28 ENCOUNTER — Emergency Department (HOSPITAL_COMMUNITY): Payer: BC Managed Care – PPO

## 2019-07-28 ENCOUNTER — Other Ambulatory Visit: Payer: Self-pay

## 2019-07-28 ENCOUNTER — Emergency Department (HOSPITAL_COMMUNITY)
Admission: EM | Admit: 2019-07-28 | Discharge: 2019-07-28 | Disposition: A | Discharge status: Home / Self Care | Payer: BC Managed Care – PPO | Attending: Emergency Medicine | Admitting: Emergency Medicine

## 2019-07-28 DIAGNOSIS — Y92414 Local residential or business street as the place of occurrence of the external cause: Secondary | ICD-10-CM | POA: Insufficient documentation

## 2019-07-28 DIAGNOSIS — R1031 Right lower quadrant pain: Secondary | ICD-10-CM | POA: Insufficient documentation

## 2019-07-28 DIAGNOSIS — M25561 Pain in right knee: Secondary | ICD-10-CM | POA: Diagnosis not present

## 2019-07-28 DIAGNOSIS — R0789 Other chest pain: Secondary | ICD-10-CM | POA: Insufficient documentation

## 2019-07-28 DIAGNOSIS — J45909 Unspecified asthma, uncomplicated: Secondary | ICD-10-CM | POA: Diagnosis not present

## 2019-07-28 DIAGNOSIS — Z794 Long term (current) use of insulin: Secondary | ICD-10-CM | POA: Insufficient documentation

## 2019-07-28 DIAGNOSIS — I708 Atherosclerosis of other arteries: Secondary | ICD-10-CM | POA: Diagnosis not present

## 2019-07-28 DIAGNOSIS — E119 Type 2 diabetes mellitus without complications: Secondary | ICD-10-CM | POA: Insufficient documentation

## 2019-07-28 DIAGNOSIS — I259 Chronic ischemic heart disease, unspecified: Secondary | ICD-10-CM | POA: Insufficient documentation

## 2019-07-28 DIAGNOSIS — R590 Localized enlarged lymph nodes: Secondary | ICD-10-CM | POA: Diagnosis not present

## 2019-07-28 DIAGNOSIS — S199XXA Unspecified injury of neck, initial encounter: Secondary | ICD-10-CM | POA: Diagnosis not present

## 2019-07-28 DIAGNOSIS — N3289 Other specified disorders of bladder: Secondary | ICD-10-CM | POA: Diagnosis not present

## 2019-07-28 DIAGNOSIS — K8689 Other specified diseases of pancreas: Secondary | ICD-10-CM | POA: Diagnosis not present

## 2019-07-28 DIAGNOSIS — Y93I9 Activity, other involving external motion: Secondary | ICD-10-CM | POA: Diagnosis not present

## 2019-07-28 DIAGNOSIS — R079 Chest pain, unspecified: Secondary | ICD-10-CM | POA: Diagnosis not present

## 2019-07-28 DIAGNOSIS — S8991XA Unspecified injury of right lower leg, initial encounter: Secondary | ICD-10-CM | POA: Diagnosis not present

## 2019-07-28 DIAGNOSIS — Z955 Presence of coronary angioplasty implant and graft: Secondary | ICD-10-CM | POA: Diagnosis not present

## 2019-07-28 DIAGNOSIS — R1032 Left lower quadrant pain: Secondary | ICD-10-CM | POA: Insufficient documentation

## 2019-07-28 DIAGNOSIS — N400 Enlarged prostate without lower urinary tract symptoms: Secondary | ICD-10-CM | POA: Diagnosis not present

## 2019-07-28 DIAGNOSIS — R000 Tachycardia, unspecified: Secondary | ICD-10-CM | POA: Diagnosis not present

## 2019-07-28 DIAGNOSIS — Y999 Unspecified external cause status: Secondary | ICD-10-CM | POA: Insufficient documentation

## 2019-07-28 DIAGNOSIS — I2510 Atherosclerotic heart disease of native coronary artery without angina pectoris: Secondary | ICD-10-CM | POA: Diagnosis not present

## 2019-07-28 DIAGNOSIS — R519 Headache, unspecified: Secondary | ICD-10-CM | POA: Insufficient documentation

## 2019-07-28 DIAGNOSIS — R52 Pain, unspecified: Secondary | ICD-10-CM | POA: Diagnosis not present

## 2019-07-28 DIAGNOSIS — Z7982 Long term (current) use of aspirin: Secondary | ICD-10-CM | POA: Insufficient documentation

## 2019-07-28 DIAGNOSIS — I10 Essential (primary) hypertension: Secondary | ICD-10-CM | POA: Diagnosis not present

## 2019-07-28 DIAGNOSIS — Z20828 Contact with and (suspected) exposure to other viral communicable diseases: Secondary | ICD-10-CM | POA: Diagnosis not present

## 2019-07-28 DIAGNOSIS — M542 Cervicalgia: Secondary | ICD-10-CM | POA: Insufficient documentation

## 2019-07-28 DIAGNOSIS — S0990XA Unspecified injury of head, initial encounter: Secondary | ICD-10-CM | POA: Diagnosis not present

## 2019-07-28 DIAGNOSIS — Z79899 Other long term (current) drug therapy: Secondary | ICD-10-CM | POA: Insufficient documentation

## 2019-07-28 DIAGNOSIS — Z87891 Personal history of nicotine dependence: Secondary | ICD-10-CM | POA: Insufficient documentation

## 2019-07-28 DIAGNOSIS — J9811 Atelectasis: Secondary | ICD-10-CM | POA: Diagnosis not present

## 2019-07-28 DIAGNOSIS — R102 Pelvic and perineal pain: Secondary | ICD-10-CM | POA: Diagnosis not present

## 2019-07-28 DIAGNOSIS — I491 Atrial premature depolarization: Secondary | ICD-10-CM | POA: Diagnosis not present

## 2019-07-28 LAB — TROPONIN I (HIGH SENSITIVITY): Troponin I (High Sensitivity): 6 ng/L (ref <18)

## 2019-07-28 LAB — CBC WITH DIFFERENTIAL/PLATELET
Abs Immature Granulocytes: 0.01 10*3/uL (ref 0.00–0.07)
Basophils Absolute: 0.1 10*3/uL (ref 0.0–0.1)
Basophils Relative: 1 %
Eosinophils Absolute: 0.4 10*3/uL (ref 0.0–0.5)
Eosinophils Relative: 5 %
HCT: 40.9 % (ref 39.0–52.0)
Hemoglobin: 13.9 g/dL (ref 13.0–17.0)
Immature Granulocytes: 0 %
Lymphocytes Relative: 20 %
Lymphs Abs: 1.6 10*3/uL (ref 0.7–4.0)
MCH: 29.3 pg (ref 26.0–34.0)
MCHC: 34 g/dL (ref 30.0–36.0)
MCV: 86.1 fL (ref 80.0–100.0)
Monocytes Absolute: 0.5 10*3/uL (ref 0.1–1.0)
Monocytes Relative: 6 %
Neutro Abs: 5.7 10*3/uL (ref 1.7–7.7)
Neutrophils Relative %: 68 %
Platelets: 215 10*3/uL (ref 150–400)
RBC: 4.75 MIL/uL (ref 4.22–5.81)
RDW: 11.9 % (ref 11.5–15.5)
WBC: 8.3 10*3/uL (ref 4.0–10.5)
nRBC: 0 % (ref 0.0–0.2)

## 2019-07-28 LAB — COMPREHENSIVE METABOLIC PANEL
ALT: 41 U/L (ref 0–44)
AST: 33 U/L (ref 15–41)
Albumin: 3.7 g/dL (ref 3.5–5.0)
Alkaline Phosphatase: 77 U/L (ref 38–126)
Anion gap: 8 (ref 5–15)
BUN: 14 mg/dL (ref 8–23)
CO2: 23 mmol/L (ref 22–32)
Calcium: 8.8 mg/dL — ABNORMAL LOW (ref 8.9–10.3)
Chloride: 106 mmol/L (ref 98–111)
Creatinine, Ser: 1.13 mg/dL (ref 0.61–1.24)
GFR calc Af Amer: >60 mL/min (ref >60)
GFR calc non Af Amer: >60 mL/min (ref >60)
Glucose, Bld: 209 mg/dL — ABNORMAL HIGH (ref 70–99)
Potassium: 4 mmol/L (ref 3.5–5.1)
Sodium: 137 mmol/L (ref 135–145)
Total Bilirubin: 0.5 mg/dL (ref 0.3–1.2)
Total Protein: 5.7 g/dL — ABNORMAL LOW (ref 6.5–8.1)

## 2019-07-28 LAB — I-STAT CHEM 8, ED
BUN: 15 mg/dL (ref 8–23)
Calcium, Ion: 1.1 mmol/L — ABNORMAL LOW (ref 1.15–1.40)
Chloride: 106 mmol/L (ref 98–111)
Creatinine, Ser: 1.1 mg/dL (ref 0.61–1.24)
Glucose, Bld: 203 mg/dL — ABNORMAL HIGH (ref 70–99)
HCT: 38 % — ABNORMAL LOW (ref 39.0–52.0)
Hemoglobin: 12.9 g/dL — ABNORMAL LOW (ref 13.0–17.0)
Potassium: 4 mmol/L (ref 3.5–5.1)
Sodium: 140 mmol/L (ref 135–145)
TCO2: 25 mmol/L (ref 22–32)

## 2019-07-28 LAB — URINALYSIS, ROUTINE W REFLEX MICROSCOPIC
Bilirubin Urine: NEGATIVE
Glucose, UA: 150 mg/dL — AB
Hgb urine dipstick: NEGATIVE
Ketones, ur: NEGATIVE mg/dL
Leukocytes,Ua: NEGATIVE
Nitrite: NEGATIVE
Protein, ur: NEGATIVE mg/dL
Specific Gravity, Urine: 1.016 (ref 1.005–1.030)
pH: 6 (ref 5.0–8.0)

## 2019-07-28 LAB — CBG MONITORING, ED: Glucose-Capillary: 183 mg/dL — ABNORMAL HIGH (ref 70–99)

## 2019-07-28 LAB — LACTIC ACID, PLASMA: Lactic Acid, Venous: 0.9 mmol/L (ref 0.5–1.9)

## 2019-07-28 LAB — PROTIME-INR
INR: 1.1 (ref 0.8–1.2)
Prothrombin Time: 13.6 seconds (ref 11.4–15.2)

## 2019-07-28 LAB — TYPE AND SCREEN
ABO/RH(D): A POS
Antibody Screen: NEGATIVE

## 2019-07-28 LAB — ABO/RH: ABO/RH(D): A POS

## 2019-07-28 MED ORDER — SODIUM CHLORIDE 0.9 % IV BOLUS
125.0000 mL | Freq: Once | INTRAVENOUS | Status: AC
  Administered 2019-07-28: 125 mL via INTRAVENOUS

## 2019-07-28 MED ORDER — ONDANSETRON HCL 4 MG/2ML IJ SOLN
4.0000 mg | Freq: Once | INTRAMUSCULAR | Status: AC
  Administered 2019-07-28: 4 mg via INTRAVENOUS
  Filled 2019-07-28: qty 2

## 2019-07-28 MED ORDER — HYDROCODONE-ACETAMINOPHEN 5-325 MG PO TABS: 1.0000 | ORAL_TABLET | Freq: Four times a day (QID) | ORAL | 0 refills | Status: DC | PRN

## 2019-07-28 MED ORDER — CYCLOBENZAPRINE HCL 10 MG PO TABS: 5.0000 mg | ORAL_TABLET | Freq: Two times a day (BID) | ORAL | 0 refills | Status: DC | PRN

## 2019-07-28 MED ORDER — IOHEXOL 350 MG/ML SOLN
100.0000 mL | Freq: Once | INTRAVENOUS | Status: AC | PRN
  Administered 2019-07-28: 100 mL via INTRAVENOUS

## 2019-07-28 MED ORDER — FAMOTIDINE IN NACL 20-0.9 MG/50ML-% IV SOLN
20.0000 mg | Freq: Once | INTRAVENOUS | Status: AC
  Administered 2019-07-28: 20 mg via INTRAVENOUS
  Filled 2019-07-28: qty 50

## 2019-07-28 MED ORDER — MORPHINE SULFATE (PF) 4 MG/ML IV SOLN
4.0000 mg | Freq: Once | INTRAVENOUS | Status: AC
  Administered 2019-07-28: 4 mg via INTRAVENOUS
  Filled 2019-07-28: qty 1
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
## 2019-07-28 NOTE — ED Notes (Signed)
Pt given 17mcg of Fentanyl by EMS.

## 2019-07-28 NOTE — ED Notes (Addendum)
Patient transported to CT 

## 2019-07-28 NOTE — Discharge Instructions (Signed)
You were given narcotic and or sedative medications while in the emergency department. °Do not drive. °Do not use machinery or power tools. °Do not sign legal documents. °Do not drink alcohol. °Do not take sleeping pills. °Do not supervise children by yourself. °Do not participate in activities that require climbing or being in high places. ° ° °Return to the emergency department immediately if you develop any of the following symptoms: °You have numbness, tingling, or weakness in the arms or legs. °You develop severe headaches not relieved with medicine. °You have severe neck pain, especially tenderness in the middle of the back of your neck. °You have changes in bowel or bladder control. °There is increasing pain in any area of the body. °You have shortness of breath, light-headedness, dizziness, or fainting. °You have chest pain. °You feel sick to your stomach (nauseous), throw up (vomit), or sweat. °You have increasing abdominal discomfort. °There is blood in your urine, stool, or vomit. °You have pain in your shoulder (shoulder strap areas). °You feel your symptoms are getting worse. ° °
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
## 2019-08-02 ENCOUNTER — Encounter: Payer: Self-pay | Admitting: Internal Medicine

## 2019-08-02 ENCOUNTER — Telehealth: Payer: Self-pay | Admitting: Dietician

## 2019-08-02 ENCOUNTER — Ambulatory Visit (INDEPENDENT_AMBULATORY_CARE_PROVIDER_SITE_OTHER): Payer: BC Managed Care – PPO | Admitting: Internal Medicine

## 2019-08-02 ENCOUNTER — Other Ambulatory Visit: Payer: Self-pay

## 2019-08-02 DIAGNOSIS — G44309 Post-traumatic headache, unspecified, not intractable: Secondary | ICD-10-CM

## 2019-08-02 DIAGNOSIS — F0781 Postconcussional syndrome: Secondary | ICD-10-CM | POA: Insufficient documentation
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
## 2019-08-02 NOTE — Telephone Encounter (Signed)
Added to [facility] schedule at 1:15 today. Thank you! Higinio Roger, RN,BSN

## 2019-08-04 NOTE — Progress Notes (Signed)
Internal Medicine Clinic Attending  Case discussed with Dr. Harbrecht at the time of the visit.  We reviewed the resident's history and exam and pertinent patient test results.  I agree with the assessment, diagnosis, and plan of care documented in the resident's note.   

## 2019-08-23 ENCOUNTER — Telehealth: Payer: Self-pay | Admitting: Dietician

## 2019-08-23 NOTE — Telephone Encounter (Signed)
Needs a follow up appointment to be cleared for work. Please call patient with an appointment

## 2019-08-24 NOTE — Telephone Encounter (Signed)
Spoke with the patient and he has sch an appt for tomorrow 08/25/2019 with [facility].

## 2019-08-25 ENCOUNTER — Ambulatory Visit (INDEPENDENT_AMBULATORY_CARE_PROVIDER_SITE_OTHER): Payer: BC Managed Care – PPO | Admitting: Internal Medicine

## 2019-08-25 ENCOUNTER — Encounter: Payer: Self-pay | Admitting: *Deleted

## 2019-08-25 VITALS — BP 142/70 | HR 74 | Temp 98.4°F | Wt 228.1 lb

## 2019-08-25 DIAGNOSIS — F419 Anxiety disorder, unspecified: Secondary | ICD-10-CM

## 2019-08-25 DIAGNOSIS — F0781 Postconcussional syndrome: Secondary | ICD-10-CM

## 2019-08-25 DIAGNOSIS — G4700 Insomnia, unspecified: Secondary | ICD-10-CM | POA: Diagnosis not present

## 2019-08-25 DIAGNOSIS — F329 Major depressive disorder, single episode, unspecified: Secondary | ICD-10-CM

## 2019-08-25 NOTE — Patient Instructions (Signed)
Thank you, Tyler Wilkinson for allowing [place] to provide your care today. Today we discussed Post concussion syndrome .    I have ordered none labs for you. I will call if any are abnormal.    I have place a referrals to Physical therapy.   I have ordered the following tests: none   I have ordered the following medication/changed the following medications: none   Please follow-up in 4 weeks.    Should you have any questions or concerns please call the internal medicine clinic at [phone].    Dellia Cloud, D.O. [facility] Internal Medicine
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
## 2019-08-29 NOTE — Progress Notes (Signed)
   CC: Post Concussion Syndrome   HPI:  [name] a 64 y.o. male with a past medical history stated below and presents today for re-evaluation and management. Please see problem based assessment and plan for additional details.   Past Medical History:  Diagnosis Date  . Asthma   . CAD (coronary artery disease)    stent RCA 2006 (other residual disease). /  nuclear 2008  no ischemia, inferolateral scar.  . Cataract   . Chronic bronchitis ([facility])    "anytime I get sick it goes into bronchitis; probably get it q yr"  . Daily headache    "just recently" (05/10/2014)  . Dyslipidemia   . Ejection fraction    EF 55%,echo, 2008, inferolateral hypokinesis,  . Erectile dysfunction   . Gastroparesis   . Hypertension   . Myocardial infarction ([facility]) 2006  . Severe obstructive sleep apnea-hypopnea syndrome 04/15/2009   Sleep study 03/15/2009 AHI of 82.2/hr oxygen desaturation nadir of 77%, loud snoring. Did not tolerate CPAP due to mask.  . Type 2 diabetes mellitus with complications ([facility]) dx'd 1972     Review of Systems: Review of Systems  Constitutional: Negative for chills, fever, malaise/fatigue and weight loss.  Respiratory: Negative for cough and shortness of breath.   Cardiovascular: Negative for chest pain, palpitations, claudication and leg swelling.  Neurological: Positive for speech change, weakness and headaches.  Psychiatric/Behavioral: Positive for depression and memory loss. The patient is nervous/anxious and has insomnia.      Vitals:   08/25/19 1527  BP: (!) 142/70  Pulse: 74  Temp: 98.4 F (36.9 C)  TempSrc: Oral  SpO2: 99%  Weight: 228 lb 1.6 oz (103.5 kg)     Physical Exam: Physical Exam  Constitutional: He is oriented to person, place, and time and well-developed, well-nourished, and in no distress.  HENT:  Head: Normocephalic and atraumatic.  Eyes: EOM are normal.  Cardiovascular: Normal rate, regular rhythm, normal heart sounds and intact  distal pulses. Exam reveals no gallop and no friction rub.  No murmur heard. Pulmonary/Chest: Breath sounds normal. No respiratory distress. He exhibits no tenderness.  Abdominal: He exhibits no distension. There is no abdominal tenderness.  Musculoskeletal:        General: No tenderness or edema.     Cervical back: Normal range of motion.  Neurological: He is alert and oriented to person, place, and time.  Skin: Skin is warm and dry.     Assessment & Plan:   See Encounters Tab for problem based charting.  Patient discussed with Dr. Oswaldo Done

## 2019-08-30 ENCOUNTER — Telehealth: Payer: Self-pay | Admitting: Physical Therapy

## 2019-08-30 NOTE — Telephone Encounter (Signed)
Called patient per facility screening policy due to high risk for complications from Covid to see if wanting to attend in person versus consider Telehealth. Left voicemail to return call and inform of preference.

## 2019-08-30 NOTE — Progress Notes (Signed)
Internal Medicine Clinic Attending  Case discussed with Dr. Coe at the time of the visit.  We reviewed the resident's history and exam and pertinent patient test results.  I agree with the assessment, diagnosis, and plan of care documented in the resident's note.    

## 2019-08-31 DIAGNOSIS — H40053 Ocular hypertension, bilateral: Secondary | ICD-10-CM | POA: Diagnosis not present

## 2019-08-31 DIAGNOSIS — E119 Type 2 diabetes mellitus without complications: Secondary | ICD-10-CM | POA: Diagnosis not present

## 2019-08-31 DIAGNOSIS — H02423 Myogenic ptosis of bilateral eyelids: Secondary | ICD-10-CM | POA: Diagnosis not present

## 2019-08-31 DIAGNOSIS — Z961 Presence of intraocular lens: Secondary | ICD-10-CM | POA: Diagnosis not present

## 2019-08-31 LAB — HM DIABETES EYE EXAM

## 2019-09-03 ENCOUNTER — Encounter: Payer: Self-pay | Admitting: *Deleted

## 2019-09-07 ENCOUNTER — Other Ambulatory Visit: Payer: Self-pay | Admitting: *Deleted

## 2019-09-07 ENCOUNTER — Telehealth: Payer: Self-pay | Admitting: Dietician

## 2019-09-07 DIAGNOSIS — N401 Enlarged prostate with lower urinary tract symptoms: Secondary | ICD-10-CM

## 2019-09-07 NOTE — Telephone Encounter (Signed)
His work's short term disability/[employer] insurance company sent papers to doctor here. It needs to be filled out and returned ASAP. He wants [place] to know he is not filing for state/federal disability.  I told him I would let our front office know.

## 2019-09-08 ENCOUNTER — Encounter: Payer: Self-pay | Admitting: *Deleted

## 2019-09-08 NOTE — Telephone Encounter (Signed)
Paperwork is in Dr. Neita Garnet box waiting to be filled out.  The doctor has been sent a message asking to please come down and complete.

## 2019-09-09 MED ORDER — TAMSULOSIN HCL 0.4 MG PO CAPS: 0.4000 mg | ORAL_CAPSULE | Freq: Every day | ORAL | 1 refills | Status: DC

## 2019-09-14 ENCOUNTER — Telehealth: Payer: Self-pay | Admitting: Dietician
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
## 2019-09-14 NOTE — Telephone Encounter (Signed)
Spoke with Tyler Wilkinson, he is having some balance issues + falls, worse in the morning.  I discussed that I would like to have him seen in our clinic this week so that we may repeat a neuro exam. Can we get him an appointment in [facility] for one day this week. I would like Dr Marchia Bond to see him if possible for continuity of care, he may need a neurology referral.

## 2019-09-15 ENCOUNTER — Encounter: Payer: Self-pay | Admitting: Internal Medicine

## 2019-09-15 ENCOUNTER — Ambulatory Visit (INDEPENDENT_AMBULATORY_CARE_PROVIDER_SITE_OTHER): Payer: BC Managed Care – PPO | Admitting: Internal Medicine

## 2019-09-15 VITALS — BP 133/67 | HR 88 | Temp 98.9°F | Ht 72.0 in | Wt 225.7 lb

## 2019-09-15 DIAGNOSIS — E1143 Type 2 diabetes mellitus with diabetic autonomic (poly)neuropathy: Secondary | ICD-10-CM | POA: Diagnosis not present

## 2019-09-15 DIAGNOSIS — F0781 Postconcussional syndrome: Secondary | ICD-10-CM

## 2019-09-15 LAB — POCT GLYCOSYLATED HEMOGLOBIN (HGB A1C): Hemoglobin A1C: 7.7 % — AB (ref 4.0–5.6)

## 2019-09-15 LAB — GLUCOSE, CAPILLARY: Glucose-Capillary: 129 mg/dL — ABNORMAL HIGH (ref 70–99)

## 2019-09-15 NOTE — Telephone Encounter (Signed)
Called patient this morning. LMOM for the patient to call back.

## 2019-09-15 NOTE — Patient Instructions (Signed)
Thank you, [name] for allowing [place] to provide your care today. Today we discussed Post Concussive Syndrome.    I have ordered noen labs for you. I will call if any are abnormal.    I have place a referrals to Neurology for [facility].   I have ordered the following tests: none   I have ordered the following medication/changed the following medications: none   Please follow-up in as needed.    Should you have any questions or concerns please call the internal medicine clinic at [phone].    Dellia Cloud, D.O. [facility] Internal Medicine

## 2019-09-15 NOTE — Telephone Encounter (Signed)
Called patient. Made him an appointment for this afternoon at 3:00pm for worsening symptoms.   Dellia Cloud, D.O. Date 09/15/2019 Time 9:54 AM [facility] Internal Medicine, PGY-1 Pager: [phone]

## 2019-09-15 NOTE — Assessment & Plan Note (Addendum)
Patient presents for follow up for his PCS after having two falls this week. He states that most of his symptoms has not improved since his last visit. Specifically he continues to have dizziness/gate instability, headaches, mental cloudiness. He denies any focal symptoms or red flag symptoms.   Plan: - Refer to neurology for second opinion.

## 2019-09-15 NOTE — Progress Notes (Signed)
   CC: PCS  HPI:  [name] a 64 y.o. male with a past medical history stated below and presents today for PCS. Please see problem based assessment and plan for additional details.    Past Medical History:  Diagnosis Date  . Asthma   . CAD (coronary artery disease)    stent RCA 2006 (other residual disease). /  nuclear 2008  no ischemia, inferolateral scar.  . Cataract   . Chronic bronchitis ([facility])    "anytime I get sick it goes into bronchitis; probably get it q yr"  . Daily headache    "just recently" (05/10/2014)  . Dyslipidemia   . Ejection fraction    EF 55%,echo, 2008, inferolateral hypokinesis,  . Erectile dysfunction   . Gastroparesis   . Hypertension   . Myocardial infarction ([facility]) 2006  . Severe obstructive sleep apnea-hypopnea syndrome 04/15/2009   Sleep study 03/15/2009 AHI of 82.2/hr oxygen desaturation nadir of 77%, loud snoring. Did not tolerate CPAP due to mask.  . Type 2 diabetes mellitus with complications ([facility]) dx'd 1972      Review of Systems: ROS   Vitals:   09/15/19 1524  BP: 133/67  Pulse: 88  Temp: 98.9 F (37.2 C)  TempSrc: Oral  SpO2: 98%  Weight: 225 lb 11.2 oz (102.4 kg)  Height: 6' (1.829 m)     Physical Exam: Physical Exam  Constitutional: He is oriented to person, place, and time.  HENT:  Head: Normocephalic and atraumatic.  Eyes: Pupils are equal, round, and reactive to light. Conjunctivae and EOM are normal.  Cardiovascular: Normal rate, regular rhythm, normal heart sounds and intact distal pulses.  Pulmonary/Chest: Effort normal and breath sounds normal. No respiratory distress. He exhibits no tenderness.  Abdominal: Soft. He exhibits no distension. There is no abdominal tenderness.  Musculoskeletal:        General: No tenderness or edema. Normal range of motion.     Cervical back: Normal range of motion.  Neurological: He is alert and oriented to person, place, and time. He has normal sensation and intact cranial  nerves. He displays weakness. He displays no tremor and normal speech. No cranial nerve deficit or sensory deficit. Coordination and gait abnormal.  Skin: He is not diaphoretic.     Assessment & Plan:   See Encounters Tab for problem based charting.  Patient discussed with Dr. Criselda Peaches

## 2019-09-16 ENCOUNTER — Telehealth: Payer: Self-pay | Admitting: Dietician

## 2019-09-16 NOTE — Telephone Encounter (Signed)
Disability papers lady name and number-Tyler Wilkinson [phone] ext. [phone] She is there 8 am to 8 pm.

## 2019-09-16 NOTE — Telephone Encounter (Signed)
Mr. Greenough says thank you to Dr. Marchia Bond for sending in the papers. The lady called him and says she has everything she needs. He also says thank you to Dr. Mikey Bussing and Crista Elliot

## 2019-09-20 ENCOUNTER — Telehealth: Payer: Self-pay | Admitting: Dietician

## 2019-09-20 NOTE — Telephone Encounter (Signed)
Relayed information from the front office regarding his referrals

## 2019-09-23 ENCOUNTER — Ambulatory Visit: Payer: BC Managed Care – PPO | Admitting: Physical Therapy

## 2019-09-23 NOTE — Progress Notes (Signed)
Internal Medicine Clinic Attending  Case discussed with Dr. Coe at the time of the visit.  We reviewed the resident's history and exam and pertinent patient test results.  I agree with the assessment, diagnosis, and plan of care documented in the resident's note.    

## 2019-09-25 ENCOUNTER — Other Ambulatory Visit: Payer: Self-pay | Admitting: Internal Medicine

## 2019-09-25 DIAGNOSIS — F0781 Postconcussional syndrome: Secondary | ICD-10-CM

## 2019-09-28 ENCOUNTER — Telehealth: Payer: Self-pay | Admitting: Internal Medicine

## 2019-09-28 ENCOUNTER — Encounter: Payer: Self-pay | Admitting: Internal Medicine

## 2019-09-28 ENCOUNTER — Ambulatory Visit (INDEPENDENT_AMBULATORY_CARE_PROVIDER_SITE_OTHER): Payer: BC Managed Care – PPO | Admitting: Internal Medicine

## 2019-09-28 ENCOUNTER — Other Ambulatory Visit: Payer: Self-pay

## 2019-09-28 DIAGNOSIS — F0781 Postconcussional syndrome: Secondary | ICD-10-CM

## 2019-09-28 NOTE — Patient Instructions (Signed)
Mr. Life,   Thanks for seeing [place] today. Please follow up with the neurologist on 10/26/2019.   Take care! Dr. Dortha Schwalbe  Please call the internal medicine center clinic if you have any questions or concerns, we may be able to help and keep you from a long and expensive emergency room wait. Our clinic and after hours phone number is [phone], the best time to call is Monday through Friday 9 am to 4 pm but there is always someone available 24/7 if you have an emergency. If you need medication refills please notify your pharmacy one week in advance and they will send [place] a request.
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
## 2019-09-28 NOTE — Telephone Encounter (Signed)
Work note done

## 2019-09-29 ENCOUNTER — Telehealth: Payer: Self-pay | Admitting: Dietician

## 2019-09-29 DIAGNOSIS — E1143 Type 2 diabetes mellitus with diabetic autonomic (poly)neuropathy: Secondary | ICD-10-CM

## 2019-09-29 NOTE — Telephone Encounter (Signed)
Price check on Omnipod Dash insulin pump per patient request and discussed with Dr. Mikey Bussing. Mr. Rishel gave his permission for insurance investigation by [facility] (the makers of the Omnipod) online and assisted with the information in the forms submitted.

## 2019-09-30 NOTE — Progress Notes (Signed)
Internal Medicine Clinic Attending  Case discussed with Dr. Dortha Schwalbe at the time of the visit.  We reviewed the resident's history and exam and pertinent patient test results.  I agree with the assessment, diagnosis, and plan of care documented in the resident's note.   Continues to be symptomatic post concussion. Neurology appointment scheduled, his work involves heavy machinery, would keep him out at least until neurology evaluation.

## 2019-10-04 ENCOUNTER — Telehealth: Payer: Self-pay | Admitting: Dietician

## 2019-10-04 DIAGNOSIS — E1143 Type 2 diabetes mellitus with diabetic autonomic (poly)neuropathy: Secondary | ICD-10-CM

## 2019-10-04 NOTE — Telephone Encounter (Signed)
Vgo is not working, he requests samples. Will pick up tomorrow. Had trouble wuth online omnipod application. He request price check prescription.

## 2019-10-05 MED ORDER — OMNIPOD DASH PDM (GEN 4) KIT: 1.0000 | PACK | 1 refills | Status: DC

## 2019-10-05 MED ORDER — OMNIPOD DASH PODS (GEN 4) MISC: 1.0000 | 11 refills | Status: DC

## 2019-10-06 ENCOUNTER — Other Ambulatory Visit: Payer: Self-pay

## 2019-10-06 ENCOUNTER — Ambulatory Visit: Payer: BC Managed Care – PPO | Attending: Internal Medicine | Admitting: Physical Therapy

## 2019-10-06 DIAGNOSIS — M25561 Pain in right knee: Secondary | ICD-10-CM

## 2019-10-06 DIAGNOSIS — M542 Cervicalgia: Secondary | ICD-10-CM | POA: Diagnosis not present

## 2019-10-06 DIAGNOSIS — M25562 Pain in left knee: Secondary | ICD-10-CM

## 2019-10-06 DIAGNOSIS — R262 Difficulty in walking, not elsewhere classified: Secondary | ICD-10-CM | POA: Diagnosis not present
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
## 2019-10-06 NOTE — Patient Instructions (Addendum)
Access Code: [email or web address] URL: [email or web address] Date: 10/06/2019 Prepared by: Narda Amber  Exercises Supine Knee Extension Strengthening - 10 reps - 2 sets - 5 seconds hold - 2-3x daily - 7x weekly Supine Cervical Retraction with Towel - 10 reps - 5 seconds hold - 2-3x daily - 7x weekly Seated Upper Trapezius Stretch - 10 reps - 3 sets - 5-10 seconds hold - 1x daily - 7x weekly

## 2019-10-06 NOTE — Telephone Encounter (Signed)
Fax received from [facility] that they do not carry this system yet. Reached out of Omnipod rep for assistance.

## 2019-10-13 ENCOUNTER — Ambulatory Visit: Payer: BC Managed Care – PPO | Admitting: Dietician

## 2019-10-14 ENCOUNTER — Encounter: Payer: Self-pay | Admitting: Physical Therapy

## 2019-10-14 ENCOUNTER — Telehealth: Payer: Self-pay | Admitting: *Deleted

## 2019-10-14 ENCOUNTER — Other Ambulatory Visit: Payer: Self-pay

## 2019-10-14 ENCOUNTER — Ambulatory Visit: Payer: BC Managed Care – PPO | Admitting: Physical Therapy

## 2019-10-14 DIAGNOSIS — M25561 Pain in right knee: Secondary | ICD-10-CM | POA: Diagnosis not present

## 2019-10-14 DIAGNOSIS — R262 Difficulty in walking, not elsewhere classified: Secondary | ICD-10-CM

## 2019-10-14 DIAGNOSIS — M25562 Pain in left knee: Secondary | ICD-10-CM

## 2019-10-14 DIAGNOSIS — M542 Cervicalgia: Secondary | ICD-10-CM

## 2019-10-14 NOTE — Telephone Encounter (Signed)
Information for PA for OmniPod was given to Dr. Mikey Bussing for completion.  Information was sent on 10/13/2019.  Fax from [employer] received OmniPod Dash 5 Pack Pods XX  was approved for patient. Ref# [id number]. Angelina Ok, RN 10/14/2019 10:21 AM

## 2019-10-14 NOTE — Therapy (Signed)
[facility] [address] [place], [place], [address] Phone: [phone]   Fax:  [phone]  Physical Therapy Treatment  Patient Details  Name: Tyler Wilkinson MRN: [id number] Date of Birth: [birth year] Referring Provider (PT): Debe Coder, MD   Encounter Date: 10/14/2019  PT End of Session - 10/14/19 2058    Visit Number  2    Number of Visits  8    Date for PT Re-Evaluation  12/03/19    Authorization Type  30 visit limit    PT Start Time  1725   arrived late   PT Stop Time  1810    PT Time Calculation (min)  45 min    Activity Tolerance  Patient limited by pain    Behavior During Therapy  [facility] for tasks assessed/performed       Past Medical History:  Diagnosis Date  . Asthma   . CAD (coronary artery disease)    stent RCA 2006 (other residual disease). /  nuclear 2008  no ischemia, inferolateral scar.  . Cataract   . Chronic bronchitis (HCC)    "anytime I get sick it goes into bronchitis; probably get it q yr"  . Daily headache    "just recently" (05/10/2014)  . Dyslipidemia   . Ejection fraction    EF 55%,echo, 2008, inferolateral hypokinesis,  . Erectile dysfunction   . Gastroparesis   . Hypertension   . Myocardial infarction (HCC) 2006  . Severe obstructive sleep apnea-hypopnea syndrome 04/15/2009   Sleep study 03/15/2009 AHI of 82.2/hr oxygen desaturation nadir of 77%, loud snoring. Did not tolerate CPAP due to mask.  . Type 2 diabetes mellitus with complications (HCC) dx'd 0973    Past Surgical History:  Procedure Laterality Date  . CORONARY ANGIOPLASTY WITH STENT PLACEMENT  ~ 2006   "1"  . LAPAROSCOPIC APPENDECTOMY N/A 11/16/2014   Procedure: APPENDECTOMY LAPAROSCOPIC;  Surgeon: Gaynelle Adu, MD;  Location: [facility] OR;  Service: General;  Laterality: N/A;  . UMBILICAL HERNIA REPAIR N/A 11/16/2014   Procedure: OPEN PRIMARY UMBILICAL HERNIA REPAIR ;  Surgeon: Gaynelle Adu, MD;  Location: [facility] OR;  Service: General;   Laterality: N/A;    There were no vitals filed for this visit.  Subjective Assessment - 10/14/19 1728    Subjective  Pt. reports left knee has been bothering him a lot-worse yesterday but still with significant pain today. Also continues with neck pain and headaches.    Currently in Pain?  Yes    Pain Score  8     Pain Location  Head    Pain Orientation  Posterior;Mid   top of head   Pain Descriptors / Indicators  Aching    Pain Type  Acute pain    Pain Onset  More than a month ago    Pain Frequency  Intermittent    Pain Score  8    Pain Location  Knee    Pain Orientation  Left    Pain Descriptors / Indicators  Aching    Pain Type  Acute pain    Pain Onset  More than a month ago    Pain Frequency  Constant    Aggravating Factors   standing, walking, transfers    Pain Relieving Factors  rest    Effect of Pain on Daily Activities  limits standing and walking tolerance                       [facility] Adult  PT Treatment/Exercise - 10/14/19 0001      Exercises   Exercises  Neck;Knee/Hip;Ankle      Neck Exercises: Supine   Neck Retraction Limitations  supine assisted retraction with upper cervical flexion/extension nod x 12 reps      Knee/Hip Exercises: Aerobic   Nustep  L2 x 7 min UE/LE      Manual Therapy   Manual Therapy  Manual Traction    Manual Traction  cervical manual traction and suboccipital release      Ankle Exercises: Standing   Heel Raises  Both;15 reps    Heel Raises Limitations  Airex    Other Standing Ankle Exercises  Airex marches x 10 reps bilat.    Other Standing Ankle Exercises  Romberg eyes open/eyes closed on Airex 10 sec ea. x 3, partial tandem stance x 30 sec ea. bilat.      Neck Exercises: Stretches   Upper Trapezius Stretch  Right;Left;2 reps;30 seconds    Upper Trapezius Stretch Limitations  supine manual stretch             PT Education - 10/14/19 2057    Education Details  concussion etiology, proprioception, exercises     Person(s) Educated  Patient    Methods  Explanation;Demonstration;Verbal cues    Comprehension  Returned demonstration;Verbalized understanding          PT Long Term Goals - 10/06/19 1647      PT LONG TERM GOAL #1   Title  Pt will be independent in his HEP and progresison.    Baseline  inital HEP issued on 10/06/2019    Time  8    Period  Weeks    Status  New    Target Date  12/03/19      PT LONG TERM GOAL #2   Title  Pt will improve his cervical rotation to >/= 50 degrees bilaterally.    Baseline  see flowsheets    Time  8    Period  Weeks    Status  New    Target Date  12/03/19      PT LONG TERM GOAL #3   Title  Pt will improve his BERG balance score to >/= 46 /56.    Baseline  38/56    Time  8    Period  Weeks    Status  New    Target Date  12/03/19      PT LONG TERM GOAL #4   Title  Pt will be able to amb 1037feet community surfaces with pain </= 4/10 in bilateral knees.    Baseline  9/10 with walking short distances </= 20 feet.    Time  8    Period  Weeks    Status  New    Target Date  12/03/19            Plan - 10/14/19 2058    Clinical Impression Statement  Ability dynamic balance challenges and LE strengthening limited by knee pain so for balance more focus static standing balance/proprioceptive challenges. Manual focus for suboccipital/upper cervial region to try and address muscle tension and associated headaches. CGA required for balance activities with gait belt due to unsteadiness. Expect progress will be gradual given severity of limitations for current timeframe s/p injury.    Personal Factors and Comorbidities  Comorbidity 3+    Comorbidities  depression, asthma, CAD, MI, DM2, decreased Ejection Fraction 55%, cataracts, bronchitis, hyperlipidemia, ulnar neuropathy, bilateral carpel tunnel syndrome, and cardica stent placement  Examination-Activity Limitations  Squat;Stairs;Stand;Transfers;Sit;Carry;Reach Overhead;Lift;Bend;Bed Mobility     Examination-Participation Restrictions  Other;Driving    Stability/Clinical Decision Making  Evolving/Moderate complexity    Clinical Decision Making  Moderate    Rehab Potential  Fair    PT Frequency  1x / week    PT Duration  8 weeks    PT Treatment/Interventions  ADLs/Self Care Home Management;Cryotherapy;Electrical Stimulation;Moist Heat;Traction;Ultrasound;Stair training;Functional mobility training;Gait training;Therapeutic activities;Therapeutic exercise;Balance training;Neuromuscular re-education;Patient/family education;Manual techniques;Taping;Dry needling;Passive range of motion;Vestibular    PT Next Visit Plan  cervical ROM, Manual/STM/jt mobs, LE strengthening, balance exercises, reassess dizziness    PT Home Exercise Plan  [id number] (cervical retraction, UT stretch ,SAQ)    Consulted and Agree with Plan of Care  Patient       Patient will benefit from skilled therapeutic intervention in order to improve the following deficits and impairments:  Pain, Postural dysfunction, Decreased strength, Decreased activity tolerance, Decreased range of motion, Dizziness, Decreased balance, Difficulty walking  Visit Diagnosis: Cervicalgia  Difficulty in walking, not elsewhere classified  Acute pain of right knee  Acute pain of left knee     Problem List Patient Active Problem List   Diagnosis Date Noted  . Post concussive syndrome 08/02/2019  . Educated about COVID-19 virus infection 01/04/2019  . Flu-like symptoms 09/30/2018  . Benign localized prostatic hyperplasia with lower urinary tract symptoms (LUTS) 08/28/2018  . Tooth pain 05/23/2017  . Viral upper respiratory tract infection 05/21/2017  . Abdominal aortic atherosclerosis ([place]) 12/19/2016  . Vomiting 11/14/2016  . Tinea corporis 01/11/2016  . H/O umbilical hernia repair 98/33/8250  . Chronic back pain 10/07/2014  . Major depression, recurrent, chronic ([place]) 10/07/2014  . Essential hypertension 05/10/2014  . Sprain of  right ankle 07/10/2012  . Diabetic gastroparesis ([place]) 05/05/2012  . Dental caries 05/05/2012  . Hyperlipidemia   . CAD (coronary artery disease), native coronary artery   . Bilateral carpal tunnel syndrome 06/09/2010  . ULNAR NEUROPATHY 06/09/2010  . Obstructive sleep apnea hypopnea, severe 04/15/2009  . CERUMEN IMPACTION 02/24/2009  . ASTHMA, INTRINSIC, WITH ACUTE EXACERBATION 12/24/2006  . Allergic rhinitis 07/28/2006  . ERECTILE DYSFUNCTION, ORGANIC 07/28/2006  . 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 [place], [place], [address] Phone: [phone]   Fax:  [phone]  Name: Tyler Wilkinson MRN: [id number] Date of Birth: October 25, 1955

## 2019-10-18 ENCOUNTER — Telehealth: Payer: Self-pay | Admitting: *Deleted

## 2019-10-18 NOTE — Telephone Encounter (Signed)
Pharmacy states trulicity is on backorder, can you change to something else or can check on samples, please advise

## 2019-10-19 ENCOUNTER — Telehealth: Payer: Self-pay | Admitting: Dietician

## 2019-10-19 NOTE — Telephone Encounter (Signed)
I was considering changing him to ozempic and called to let him know however he reports he just filled a Rx of trulicity.   I discussed we can hold off any change until he needs the next refill.

## 2019-10-19 NOTE — Telephone Encounter (Signed)
Omnipod pump supplies in and he wants to finish his VGos before starting it. Wants to be trained in April.

## 2019-10-21 ENCOUNTER — Encounter: Payer: Self-pay | Admitting: Physical Therapy

## 2019-10-21 ENCOUNTER — Other Ambulatory Visit: Payer: Self-pay

## 2019-10-21 ENCOUNTER — Ambulatory Visit: Payer: BC Managed Care – PPO | Attending: Internal Medicine | Admitting: Physical Therapy

## 2019-10-21 DIAGNOSIS — R262 Difficulty in walking, not elsewhere classified: Secondary | ICD-10-CM | POA: Diagnosis not present

## 2019-10-21 DIAGNOSIS — M25562 Pain in left knee: Secondary | ICD-10-CM

## 2019-10-21 DIAGNOSIS — M542 Cervicalgia: Secondary | ICD-10-CM | POA: Insufficient documentation

## 2019-10-21 DIAGNOSIS — M25561 Pain in right knee: Secondary | ICD-10-CM | POA: Diagnosis not present
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
## 2019-10-25 ENCOUNTER — Ambulatory Visit (INDEPENDENT_AMBULATORY_CARE_PROVIDER_SITE_OTHER): Payer: BC Managed Care – PPO | Admitting: Dietician

## 2019-10-25 ENCOUNTER — Encounter: Payer: Self-pay | Admitting: Dietician

## 2019-10-25 DIAGNOSIS — Z713 Dietary counseling and surveillance: Secondary | ICD-10-CM

## 2019-10-25 NOTE — Progress Notes (Signed)
Medical Nutrition Therapy Start time: 1535  End time:1620  Assessment: Mfr. Hodes's CGM was uploaded, patterns of high 1 am to 5 am discussed with him. He desires lower blood sugar, but struggles with appetite and reports this is the only time he has an appetite.  patient eats most of his daily intake during this time. He brought his omnipod PDM and a pod and is not ready to start pump training for another month.   Nutritional Diagnosis:  NB-1.1 Food and nutrition-related knowledge deficit As related to lack of prior sufficient education about  gastroparesis, insulin pumps, readings a Continuous glucose monitor report As evidenced by his report and questions.   Intervention:Nutrition education on gastroparesis, insulin pump and Continuous glucose monitoring  Monitoring and Evaluation: follow up in 4 weeks for Omnipod insulin pump training.   Norm Parcel, RD 10/25/2019 5:47 PM.   Flu vaccine- patient reports he got it this year at [facility]

## 2019-10-25 NOTE — Patient Instructions (Signed)
Try taking food insulin clicks at least 15 minutes BEFORE eating.   Try to stop eating at least 2-3 hours before going to bed.
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
## 2019-10-26 ENCOUNTER — Other Ambulatory Visit: Payer: Self-pay

## 2019-10-26 ENCOUNTER — Ambulatory Visit (INDEPENDENT_AMBULATORY_CARE_PROVIDER_SITE_OTHER): Payer: BC Managed Care – PPO | Admitting: Neurology

## 2019-10-26 ENCOUNTER — Encounter: Payer: Self-pay | Admitting: Neurology

## 2019-10-26 VITALS — BP 135/83 | HR 79 | Resp 18 | Ht 73.0 in | Wt 227.0 lb

## 2019-10-26 DIAGNOSIS — G44321 Chronic post-traumatic headache, intractable: Secondary | ICD-10-CM

## 2019-10-26 DIAGNOSIS — F0781 Postconcussional syndrome: Secondary | ICD-10-CM | POA: Diagnosis not present

## 2019-10-26 MED ORDER — NORTRIPTYLINE HCL 10 MG PO CAPS: 10.0000 mg | ORAL_CAPSULE | Freq: Every day | ORAL | 3 refills | Status: DC

## 2019-10-26 NOTE — Patient Instructions (Addendum)
Start nortriptyline 10mg  at bedtime to help with headache. I will refer you to somebody to assess if you may return to work In meantime, will provide letter to keep you out of work until April 9.    We have sent a referral to [facility] for your MRI and they will call you directly to schedule your appointment. They are located at [address] [facility]. If you need to contact them directly please call [phone].

## 2019-10-27 ENCOUNTER — Other Ambulatory Visit: Payer: Self-pay

## 2019-10-27 DIAGNOSIS — G44321 Chronic post-traumatic headache, intractable: Secondary | ICD-10-CM

## 2019-10-27 DIAGNOSIS — F0781 Postconcussional syndrome: Secondary | ICD-10-CM

## 2019-10-28 ENCOUNTER — Ambulatory Visit: Payer: BC Managed Care – PPO | Admitting: Physical Therapy

## 2019-11-04 ENCOUNTER — Ambulatory Visit: Payer: BC Managed Care – PPO | Admitting: Physical Therapy

## 2019-11-05 ENCOUNTER — Telehealth: Payer: Self-pay | Admitting: Dietician

## 2019-11-05 NOTE — Telephone Encounter (Signed)
Tyler Wilkinson called to ask about covd vaccinations. say he gives permission for me to talk to the [employer] rep in preparation for upcoming his pump training.

## 2019-11-08 ENCOUNTER — Other Ambulatory Visit: Payer: Self-pay | Admitting: *Deleted

## 2019-11-08 ENCOUNTER — Telehealth: Payer: Self-pay | Admitting: Neurology

## 2019-11-08 MED ORDER — ALPRAZOLAM 0.5 MG PO TABS: ORAL_TABLET | ORAL | 2 refills | Status: DC

## 2019-11-08 NOTE — Telephone Encounter (Signed)
Asked patient to contact [facility].

## 2019-11-08 NOTE — Telephone Encounter (Signed)
Pt states that Dr Everlena Cooper wanted him to have PT for something and they dont have anything from [place] and he is trying to find out what is going on please call

## 2019-11-09 NOTE — Telephone Encounter (Signed)
Per patient he got everything worked out he wanted you to know you dont have to call

## 2019-11-11 ENCOUNTER — Ambulatory Visit: Payer: BC Managed Care – PPO | Admitting: Physical Therapy

## 2019-11-17 ENCOUNTER — Other Ambulatory Visit: Payer: Self-pay | Admitting: Dietician

## 2019-11-17 DIAGNOSIS — N401 Enlarged prostate with lower urinary tract symptoms: Secondary | ICD-10-CM

## 2019-11-17 DIAGNOSIS — R3914 Feeling of incomplete bladder emptying: Secondary | ICD-10-CM

## 2019-11-17 MED ORDER — TAMSULOSIN HCL 0.4 MG PO CAPS: 0.8000 mg | ORAL_CAPSULE | Freq: Every day | ORAL | 1 refills | Status: DC

## 2019-11-17 NOTE — Telephone Encounter (Signed)
Is requesting a refill because he is taking double the prescribed dose per Dr.Hoffman and has run out. Would like Dr.Hoffman to call him if he gets a chance. He is willing to do a telehealth visit.

## 2019-11-23 ENCOUNTER — Ambulatory Visit (INDEPENDENT_AMBULATORY_CARE_PROVIDER_SITE_OTHER): Payer: BC Managed Care – PPO | Admitting: Dietician

## 2019-11-23 ENCOUNTER — Encounter: Payer: Self-pay | Admitting: Dietician

## 2019-11-23 ENCOUNTER — Other Ambulatory Visit: Payer: Self-pay

## 2019-11-23 DIAGNOSIS — F0781 Postconcussional syndrome: Secondary | ICD-10-CM

## 2019-11-23 DIAGNOSIS — Z713 Dietary counseling and surveillance: Secondary | ICD-10-CM | POA: Diagnosis not present

## 2019-11-23 NOTE — Progress Notes (Addendum)
[employer]od insulin pump training: Start time: 1545 end time 1800  Mr. Bloxham presented with his Omnipod insulin pump PDM and pods to be trained on how to use the insuln pump. Settings were approved by Dr. Mikey Bussing and will be scanned into his chart. The training included Mr. Vonbargen, two [employer]od insulin pump trainers via Web Ex and myself. Mr. Kerschner was taught about his Omnipod system, carbohydrate counting, safety measures to prevent high and low blood sugars  and how to administer a bolus before meals. Mr. Rucinski plugged her phone number and [employer] technical support number into his phone during the visit. He reports working on a goal to decrease regular soda intake and has successfully decreased the number of cans of soda from 5-6 re day to 2-3 cans per day by drinking more water. Plan: he needs additional training. The Trainer from [employer] will continue training over the phone with Mr. Danzy and he agreed to a follow up in office in 2 weeks.  Lupita Leash Nilan Iddings, RD 11/23/2019 6:39 PM.  Call to follow up on how he did with the pump. He reports blood sugar 180 before bed last night. He ate grits and soda and took insulin for it. His blood sugar was 120 this am, went back to sleep and when he awoke it was 90, then 101, ate took insulin for carbs, blood sugar is showing a straight line right know. Likes that it shows insulin on board. He says he likes the pump overall.  He notes he is beginning to look on the back of everything he picks up to read the carbs. Verbalizes that he knows how to guess if needed. I discouraged guessing as he may over or under dose by doing so.

## 2019-11-24 ENCOUNTER — Telehealth: Payer: Self-pay | Admitting: Neurology

## 2019-11-24 ENCOUNTER — Encounter: Payer: Self-pay | Admitting: Physical Therapy

## 2019-11-24 ENCOUNTER — Other Ambulatory Visit: Payer: Self-pay | Admitting: Dietician

## 2019-11-24 ENCOUNTER — Other Ambulatory Visit: Payer: Self-pay

## 2019-11-24 ENCOUNTER — Ambulatory Visit: Payer: BC Managed Care – PPO | Attending: Internal Medicine | Admitting: Physical Therapy

## 2019-11-24 DIAGNOSIS — M542 Cervicalgia: Secondary | ICD-10-CM

## 2019-11-24 DIAGNOSIS — E1143 Type 2 diabetes mellitus with diabetic autonomic (poly)neuropathy: Secondary | ICD-10-CM

## 2019-11-24 DIAGNOSIS — R262 Difficulty in walking, not elsewhere classified: Secondary | ICD-10-CM

## 2019-11-24 DIAGNOSIS — M25561 Pain in right knee: Secondary | ICD-10-CM | POA: Diagnosis not present

## 2019-11-24 DIAGNOSIS — M25562 Pain in left knee: Secondary | ICD-10-CM | POA: Diagnosis not present

## 2019-11-24 NOTE — Patient Instructions (Signed)
Please use the food library or labels on foods before entering the carbs in your bolus calculator.  Change the pod every 2-3 days as directed by the pod.

## 2019-11-24 NOTE — Telephone Encounter (Signed)
Pt came by the office today needing a note for work the MRI is tomorrow and the other test is maybe next week. He will need a note to be out of work until may or when he can see Dr Everlena Cooper

## 2019-11-24 NOTE — Therapy (Signed)
[place], [place], [address] Phone: [phone]   Fax:  [phone]  Physical Therapy Treatment  Patient Details  Name: Tyler Wilkinson MRN: [id number] Date of Birth: 01/06/1956 Referring Provider (PT): Gilles Chiquito, MD   Encounter Date: 11/24/2019  PT End of Session - 11/24/19 1405    Visit Number  4    Number of Visits  8    Date for PT Re-Evaluation  12/03/19    Authorization Type  30 visit limit    PT Start Time  1334    PT Stop Time  1415    PT Time Calculation (min)  41 min    Activity Tolerance  Patient limited by pain    Behavior During Therapy  [facility] for tasks assessed/performed       Past Medical History:  Diagnosis Date  . Asthma   . CAD (coronary artery disease)    stent RCA 2006 (other residual disease). /  nuclear 2008  no ischemia, inferolateral scar.  . Cataract   . Chronic bronchitis ([place])    "anytime I get sick it goes into bronchitis; probably get it q yr"  . Daily headache    "just recently" (05/10/2014)  . Dyslipidemia   . Ejection fraction    EF 55%,echo, 2008, inferolateral hypokinesis,  . Erectile dysfunction   . Gastroparesis   . Hypertension   . Myocardial infarction ([place]) 2006  . Severe obstructive sleep apnea-hypopnea syndrome 04/15/2009   Sleep study 03/15/2009 AHI of 82.2/hr oxygen desaturation nadir of 77%, loud snoring. Did not tolerate CPAP due to mask.  . Type 2 diabetes mellitus with complications ([place]) dx'd 8891    Past Surgical History:  Procedure Laterality Date  . CORONARY ANGIOPLASTY WITH STENT PLACEMENT  ~ 2006   "1"  . LAPAROSCOPIC APPENDECTOMY N/A 11/16/2014   Procedure: APPENDECTOMY LAPAROSCOPIC;  Surgeon: Greer Pickerel, MD;  Location: [place];  Service: General;  Laterality: N/A;  . UMBILICAL HERNIA REPAIR N/A 11/16/2014   Procedure: OPEN PRIMARY UMBILICAL HERNIA REPAIR ;  Surgeon: Greer Pickerel, MD;  Location: [facility];  Service: General;  Laterality: N/A;     There were no vitals filed for this visit.  Subjective Assessment - 11/24/19 1336    Subjective  Pt. returns, not seen since 10/21/19 for therapy. He reports felt like he had been improving but symptoms worse today including headache and knee pain though knee pain has eased up some recently. Pt. saw Dr. Tomi Likens and brain MRI has been ordered-pt. reports this is scheduled for tomorrow. He has also had FCE ordered to help determine status for work but this is still pending scheduling as well. He also reports was referred to sports medicine MD that is concussion specialist.    Currently in Pain?  Yes    Pain Score  6     Pain Location  Head    Pain Orientation  --   top of head   Pain Descriptors / Indicators  Aching    Pain Type  Acute pain    Pain Onset  More than a month ago    Pain Frequency  Intermittent    Pain Score  5    Pain Location  Knee    Pain Orientation  Left;Right    Pain Descriptors / Indicators  Aching    Pain Onset  More than a month ago    Pain Frequency  Constant    Aggravating Factors   standing and walking  Pain Relieving Factors  rest    Effect of Pain on Daily Activities  limits activity tolerance         [facility] PT Assessment - 11/24/19 0001      Strength   Right Knee Flexion  5/5    Right Knee Extension  4+/5    Left Knee Flexion  5/5    Left Knee Extension  5/5                   [facility] Adult PT Treatment/Exercise - 11/24/19 0001      Neck Exercises: Supine   Other Supine Exercise  supine upper trap stretch (manual) 2 x 30 sec ea. bilat., supine pec minor stretch 30 sec x 2      Knee/Hip Exercises: Aerobic   Nustep  L3 x 6 min UE/LE      Knee/Hip Exercises: Seated   Long Arc Quad  Right;Left;10 reps    Long Arc Quad Weight  3 lbs.    Clamshell with TheraBand  Green   x20 reps   Hamstring Curl  AROM;Right;Left;15 reps    Hamstring Limitations  green    Sit to Sand  2 sets;5 reps;without UE support   from low mat with incline wedge      Manual Therapy   Manual therapy comments  suboccipital release      Ankle Exercises: Standing   SLS  3 x 5-10 sec holds on Airex    Rocker Board  2 minutes   dynamic balance x 1 min ea. fw/rev and lateral   Heel Raises  Both;20 reps    Heel Raises Limitations  Airex    Other Standing Ankle Exercises  Airex marches x 20 reps                  PT Long Term Goals - 10/21/19 2038      PT LONG TERM GOAL #1   Title  Pt will be independent in his HEP and progresison.    Baseline  inital HEP issued on 10/06/2019    Time  8    Period  Weeks    Status  On-going      PT LONG TERM GOAL #2   Title  Pt will improve his cervical rotation to >/= 50 degrees bilaterally.    Baseline  met-see objective    Period  Weeks    Status  Achieved      PT LONG TERM GOAL #3   Title  Pt will improve his BERG balance score to >/= 46 /56.    Baseline  38/56    Time  8    Period  Weeks    Status  On-going      PT LONG TERM GOAL #4   Title  Pt will be able to amb 1054fet community surfaces with pain </= 4/10 in bilateral knees.    Baseline  left knee pain 6/10    Time  8    Period  Weeks    Status  On-going            Plan - 11/24/19 1401    Clinical Impression Statement  Pt. continues with chronic headache symptoms s/p concussion and pending MRI tomorrow for further assessment of this. He continues with decreased balance/proprioception with limited SLS ability. If knee pain issues persist also recommend further follow up with MD to assess. Plan continue PT for further progress pending further test results.    Personal Factors and  Comorbidities  Comorbidity 3+    Comorbidities  depression, asthma, CAD, MI, DM2, decreased Ejection Fraction 55%, cataracts, bronchitis, hyperlipidemia, ulnar neuropathy, bilateral carpel tunnel syndrome, and cardica stent placement    Examination-Activity Limitations  Squat;Stairs;Stand;Transfers;Sit;Carry;Reach Overhead;Lift;Bend;Bed Mobility     Examination-Participation Restrictions  Other;Driving    Stability/Clinical Decision Making  Evolving/Moderate complexity    Clinical Decision Making  Moderate    Rehab Potential  Fair    PT Frequency  1x / week    PT Duration  8 weeks    PT Treatment/Interventions  ADLs/Self Care Home Management;Cryotherapy;Electrical Stimulation;Moist Heat;Traction;Ultrasound;Stair training;Functional mobility training;Gait training;Therapeutic activities;Therapeutic exercise;Balance training;Neuromuscular re-education;Patient/family education;Manual techniques;Taping;Dry needling;Passive range of motion;Vestibular    PT Next Visit Plan  await results from brain MRI, cervical ROM, Manual/STM/jt mobs, LE strengthening, balance exercises, reassess dizziness    PT Home Exercise Plan  [id number] (cervical retraction, UT stretch ,SAQ)    Consulted and Agree with Plan of Care  Patient       Patient will benefit from skilled therapeutic intervention in order to improve the following deficits and impairments:  Pain, Postural dysfunction, Decreased strength, Decreased activity tolerance, Decreased range of motion, Dizziness, Decreased balance, Difficulty walking  Visit Diagnosis: Cervicalgia  Difficulty in walking, not elsewhere classified  Acute pain of right knee  Acute pain of left knee     Problem List Patient Active Problem List   Diagnosis Date Noted  . Post concussive syndrome 08/02/2019  . Educated about COVID-19 virus infection 01/04/2019  . Flu-like symptoms 09/30/2018  . Benign localized prostatic hyperplasia with lower urinary tract symptoms (LUTS) 08/28/2018  . Tooth pain 05/23/2017  . Viral upper respiratory tract infection 05/21/2017  . Abdominal aortic atherosclerosis ([place]) 12/19/2016  . Vomiting 11/14/2016  . Tinea corporis 01/11/2016  . H/O umbilical hernia repair 81/15/7262  . Chronic back pain 10/07/2014  . Major depression, recurrent, chronic ([place]) 10/07/2014  . Essential  hypertension 05/10/2014  . Sprain of right ankle 07/10/2012  . Diabetic gastroparesis ([place]) 05/05/2012  . Dental caries 05/05/2012  . Hyperlipidemia   . CAD (coronary artery disease), native coronary artery   . Bilateral carpal tunnel syndrome 06/09/2010  . ULNAR NEUROPATHY 06/09/2010  . Obstructive sleep apnea hypopnea, severe 04/15/2009  . CERUMEN IMPACTION 02/24/2009  . ASTHMA, INTRINSIC, WITH ACUTE EXACERBATION 12/24/2006  . Allergic rhinitis 07/28/2006  . ERECTILE DYSFUNCTION, ORGANIC 07/28/2006  . 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 ([place]) 06/03/2006   Beaulah Dinning, PT, DPT 11/24/19 2:15 PM  [facility] [address] [place], [place], [address] Phone: [phone]   Fax:  [phone]  Name: Tyler Wilkinson MRN: [id number] Date of Birth: [birth year]

## 2019-11-24 NOTE — Progress Notes (Signed)
Referral request

## 2019-11-25 NOTE — Telephone Encounter (Signed)
He was supposed to be referred to Occupational therapy at [facility] for functional capacity/work conditioning evaluation.  Was this done/scheduled?  This test needs to be done before I can determine if/when he may return to work.

## 2019-11-25 NOTE — Telephone Encounter (Signed)
Trid calling pt,LVM Per DR. Jaffe pt shouldv'e had OT done so he can determine how long the pt can be out of work.   I did see that the Pt had PT done 11/24/19.

## 2019-11-26 ENCOUNTER — Ambulatory Visit
Admission: RE | Admit: 2019-11-26 | Discharge: 2019-11-26 | Disposition: A | Discharge status: Home / Self Care | Payer: BC Managed Care – PPO | Source: Ambulatory Visit | Attending: Neurology | Admitting: Neurology

## 2019-11-26 DIAGNOSIS — G44309 Post-traumatic headache, unspecified, not intractable: Secondary | ICD-10-CM | POA: Diagnosis not present

## 2019-11-26 DIAGNOSIS — R413 Other amnesia: Secondary | ICD-10-CM | POA: Diagnosis not present

## 2019-11-26 DIAGNOSIS — G44321 Chronic post-traumatic headache, intractable: Secondary | ICD-10-CM

## 2019-11-26 DIAGNOSIS — F0781 Postconcussional syndrome: Secondary | ICD-10-CM

## 2019-11-29 ENCOUNTER — Other Ambulatory Visit: Payer: Self-pay

## 2019-11-29 ENCOUNTER — Ambulatory Visit: Payer: BC Managed Care – PPO | Admitting: Physical Therapy

## 2019-11-29 ENCOUNTER — Telehealth: Payer: Self-pay

## 2019-11-29 ENCOUNTER — Encounter: Payer: Self-pay | Admitting: Physical Therapy

## 2019-11-29 DIAGNOSIS — M25561 Pain in right knee: Secondary | ICD-10-CM

## 2019-11-29 DIAGNOSIS — R262 Difficulty in walking, not elsewhere classified: Secondary | ICD-10-CM

## 2019-11-29 DIAGNOSIS — M542 Cervicalgia: Secondary | ICD-10-CM

## 2019-11-29 DIAGNOSIS — M25562 Pain in left knee: Secondary | ICD-10-CM

## 2019-11-29 DIAGNOSIS — F0781 Postconcussional syndrome: Secondary | ICD-10-CM
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
## 2019-11-29 NOTE — Telephone Encounter (Signed)
OK.  If [facility] can do it, then that is fine.

## 2019-11-29 NOTE — Telephone Encounter (Signed)
Pt states he has an appointment with [facility]. Today at 1:30pm. He was told at his last visit that he could do his Functional Captaiity Evaluation test . He is unable to go to [facility] due to [employer].

## 2019-12-01 ENCOUNTER — Ambulatory Visit: Payer: BC Managed Care – PPO

## 2019-12-01 ENCOUNTER — Ambulatory Visit: Payer: BC Managed Care – PPO | Admitting: Physical Therapy

## 2019-12-01 ENCOUNTER — Other Ambulatory Visit: Payer: Self-pay

## 2019-12-01 DIAGNOSIS — M542 Cervicalgia: Secondary | ICD-10-CM | POA: Diagnosis not present

## 2019-12-01 DIAGNOSIS — M25562 Pain in left knee: Secondary | ICD-10-CM

## 2019-12-01 DIAGNOSIS — R262 Difficulty in walking, not elsewhere classified: Secondary | ICD-10-CM | POA: Diagnosis not present

## 2019-12-01 DIAGNOSIS — M25561 Pain in right knee: Secondary | ICD-10-CM | POA: Diagnosis not present

## 2019-12-01 NOTE — Therapy (Signed)
[place], [place], [address] Phone: [phone]   Fax:  [phone]  Physical Therapy Evaluation/FCE  Patient Details  Name: Tyler Wilkinson MRN: [id number] Date of Birth: [birth year] Referring Provider (PT): Tyler Chiquito, MD   Encounter Date: 12/01/2019  PT End of Session - 12/01/19 1206    Visit Number  6    Number of Visits  8    Authorization Type  30 visit limit    PT Start Time  7681    PT Stop Time  1508    PT Time Calculation (min)  183 min    Activity Tolerance  Patient limited by pain    Behavior During Therapy  [facility] for tasks assessed/performed       Past Medical History:  Diagnosis Date  . Asthma   . CAD (coronary artery disease)    stent RCA 2006 (other residual disease). /  nuclear 2008  no ischemia, inferolateral scar.  . Cataract   . Chronic bronchitis ([place])    "anytime I get sick it goes into bronchitis; probably get it q yr"  . Daily headache    "just recently" (05/10/2014)  . Dyslipidemia   . Ejection fraction    EF 55%,echo, 2008, inferolateral hypokinesis,  . Erectile dysfunction   . Gastroparesis   . Hypertension   . Myocardial infarction ([place]) 2006  . Severe obstructive sleep apnea-hypopnea syndrome 04/15/2009   Sleep study 03/15/2009 AHI of 82.2/hr oxygen desaturation nadir of 77%, loud snoring. Did not tolerate CPAP due to mask.  . Type 2 diabetes mellitus with complications ([place]) dx'd 1572    Past Surgical History:  Procedure Laterality Date  . CORONARY ANGIOPLASTY WITH STENT PLACEMENT  ~ 2006   "1"  . LAPAROSCOPIC APPENDECTOMY N/A 11/16/2014   Procedure: APPENDECTOMY LAPAROSCOPIC;  Surgeon: Tyler Pickerel, MD;  Location: [place];  Service: General;  Laterality: N/A;  . UMBILICAL HERNIA REPAIR N/A 11/16/2014   Procedure: OPEN PRIMARY UMBILICAL HERNIA REPAIR ;  Surgeon: Tyler Pickerel, MD;  Location: [place];  Service: General;  Laterality: N/A;    There were no vitals filed  for this visit.   Subjective Assessment - 12/01/19 1208    Subjective  Needs FCE to return to work                    Objective measurements completed on examination: See above findings.                   PT Long Term Goals - 10/21/19 2038      PT LONG TERM GOAL #1   Title  Pt will be independent in his HEP and progresison.    Baseline  inital HEP issued on 10/06/2019    Time  8    Period  Weeks    Status  On-going      PT LONG TERM GOAL #2   Title  Pt will improve his cervical rotation to >/= 50 degrees bilaterally.    Baseline  met-see objective    Period  Weeks    Status  Achieved      PT LONG TERM GOAL #3   Title  Pt will improve his BERG balance score to >/= 46 /56.    Baseline  38/56    Time  8    Period  Weeks    Status  On-going      PT LONG TERM GOAL #4   Title  Pt  will be able to amb 1055fet community surfaces with pain </= 4/10 in bilateral knees.    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 Bilateral knee pain LT > RT impacted the testing.  He had some dizziness with repetative turning in the last task of the testing.   He may benefit from an assessment by an Orthopedic MD  of his knees.    PT Treatment/Interventions  ADLs/Self Care Home Management;Cryotherapy;Electrical Stimulation;Moist Heat;Traction;Ultrasound;Stair training;Functional mobility training;Gait training;Therapeutic activities;Therapeutic exercise;Balance training;Neuromuscular re-education;Patient/family education;Manual techniques;Taping;Dry needling;Passive range of motion;Vestibular    PT Next Visit Plan  FCE report will be sent to Tyler Wilkinson.    PT Home Exercise Plan  [id number](cervical retraction, UT stretch ,SAQ)    Consulted and Agree with Plan of Care  Patient       Patient  will benefit from skilled therapeutic intervention in order to improve the following deficits and impairments:  Pain, Postural dysfunction, Decreased strength, Decreased activity tolerance, Decreased range of motion, Dizziness, Decreased balance, Difficulty walking  Visit Diagnosis: Cervicalgia  Acute pain of right knee  Acute pain of left knee     Problem List Patient Active Problem List   Diagnosis Date Noted  . Post concussive syndrome 08/02/2019  . Educated about COVID-19 virus infection 01/04/2019  . Flu-like symptoms 09/30/2018  . Benign localized prostatic hyperplasia with lower urinary tract symptoms (LUTS) 08/28/2018  . Tooth pain 05/23/2017  . Viral upper respiratory tract infection 05/21/2017  . Abdominal aortic atherosclerosis ([place] 12/19/2016  . Vomiting 11/14/2016  . Tinea corporis 01/11/2016  . H/O umbilical hernia repair 086/76/7209 . Chronic back pain 10/07/2014  . Major depression, recurrent, chronic ([place] 10/07/2014  . Essential hypertension 05/10/2014  . Sprain of right ankle 07/10/2012  . Diabetic gastroparesis ([place] 05/05/2012  . Dental caries 05/05/2012  . Hyperlipidemia   . CAD (coronary artery disease), native coronary artery   . Bilateral carpal tunnel syndrome 06/09/2010  . ULNAR NEUROPATHY 06/09/2010  . Obstructive sleep apnea hypopnea, severe 04/15/2009  . CERUMEN IMPACTION 02/24/2009  . ASTHMA, INTRINSIC, WITH ACUTE EXACERBATION 12/24/2006  . Allergic rhinitis 07/28/2006  . ERECTILE DYSFUNCTION, ORGANIC 07/28/2006  . 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 ([place] 06/03/2006    CDarrel Wilkinson PT 12/01/2019, 3:34 PM  [facility] [address]Phone: [phone]  Fax:  [phone] Name: Tyler DETTMERMRN: [id number] of Birth: 308/05/1956

## 2019-12-06 NOTE — Telephone Encounter (Signed)
1. Patient called to check in with Dr. Everlena Cooper after having his occupational testing done last week. He needs to know how to proceed from here.  2. Patient requesting a letter to pickup from Dr. Everlena Cooper excusing him from work.

## 2019-12-07 NOTE — Telephone Encounter (Signed)
Tyler Wilkinson,   Could you see if Mr. Minion can see me on Wednesday at 12?  Thank you

## 2019-12-07 NOTE — Telephone Encounter (Signed)
Pt is sch for 12-08-19 @ 12

## 2019-12-07 NOTE — Telephone Encounter (Signed)
Tyler Wilkinson, I reviewed the functional capacity evaluation.  I would need him to make a follow up appointment to discuss further.  It can be a virtual visit if that would be faster.
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
## 2019-12-08 ENCOUNTER — Ambulatory Visit: Payer: BC Managed Care – PPO | Admitting: Physical Therapy

## 2019-12-08 ENCOUNTER — Encounter: Payer: Self-pay | Admitting: Physical Therapy

## 2019-12-08 ENCOUNTER — Ambulatory Visit (INDEPENDENT_AMBULATORY_CARE_PROVIDER_SITE_OTHER): Payer: BC Managed Care – PPO | Admitting: Neurology

## 2019-12-08 ENCOUNTER — Encounter: Payer: Self-pay | Admitting: Neurology

## 2019-12-08 ENCOUNTER — Other Ambulatory Visit: Payer: Self-pay

## 2019-12-08 VITALS — BP 146/80 | HR 75 | Ht 72.0 in | Wt 227.0 lb

## 2019-12-08 DIAGNOSIS — M25561 Pain in right knee: Secondary | ICD-10-CM | POA: Diagnosis not present

## 2019-12-08 DIAGNOSIS — G44309 Post-traumatic headache, unspecified, not intractable: Secondary | ICD-10-CM | POA: Diagnosis not present

## 2019-12-08 DIAGNOSIS — M25562 Pain in left knee: Secondary | ICD-10-CM

## 2019-12-08 DIAGNOSIS — R262 Difficulty in walking, not elsewhere classified: Secondary | ICD-10-CM | POA: Diagnosis not present

## 2019-12-08 DIAGNOSIS — M542 Cervicalgia: Secondary | ICD-10-CM | POA: Diagnosis not present

## 2019-12-08 NOTE — Patient Instructions (Signed)
1.  Take nortriptyline 10mg  at bedtime 2.  Will refer you to orthopedics for your knee pain 3.  Will write you out of work back dating from April 10 until May 3. 4.  Follow up in 4 months.
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
## 2019-12-09 NOTE — Addendum Note (Signed)
Addended by: Ayesha Mohair on: 12/09/2019 07:49 AM   Modules accepted: Orders

## 2019-12-13 ENCOUNTER — Encounter: Payer: BC Managed Care – PPO | Admitting: Dietician

## 2019-12-13 ENCOUNTER — Ambulatory Visit: Payer: BC Managed Care – PPO | Admitting: Dietician

## 2019-12-13 ENCOUNTER — Telehealth: Payer: Self-pay | Admitting: Dietician

## 2019-12-13 NOTE — Telephone Encounter (Signed)
Likes insulin pump, only a few blood sugars over 300 and he thinks that was because he missed counting carbs. Is learning how to correct high blood sugars safely.

## 2019-12-15 ENCOUNTER — Encounter: Payer: Self-pay | Admitting: Orthopaedic Surgery

## 2019-12-15 ENCOUNTER — Ambulatory Visit: Payer: Self-pay

## 2019-12-15 ENCOUNTER — Telehealth: Payer: Self-pay | Admitting: Dietician

## 2019-12-15 ENCOUNTER — Encounter: Payer: Self-pay | Admitting: Physical Therapy

## 2019-12-15 ENCOUNTER — Ambulatory Visit (INDEPENDENT_AMBULATORY_CARE_PROVIDER_SITE_OTHER): Payer: BC Managed Care – PPO | Admitting: Orthopaedic Surgery

## 2019-12-15 ENCOUNTER — Other Ambulatory Visit: Payer: Self-pay

## 2019-12-15 ENCOUNTER — Ambulatory Visit: Payer: BC Managed Care – PPO | Admitting: Physical Therapy

## 2019-12-15 VITALS — Ht 72.5 in | Wt 220.0 lb

## 2019-12-15 DIAGNOSIS — M25561 Pain in right knee: Secondary | ICD-10-CM

## 2019-12-15 DIAGNOSIS — R262 Difficulty in walking, not elsewhere classified: Secondary | ICD-10-CM | POA: Diagnosis not present

## 2019-12-15 DIAGNOSIS — M25562 Pain in left knee: Secondary | ICD-10-CM

## 2019-12-15 DIAGNOSIS — G8929 Other chronic pain: Secondary | ICD-10-CM | POA: Diagnosis not present

## 2019-12-15 DIAGNOSIS — M542 Cervicalgia: Secondary | ICD-10-CM

## 2019-12-15 MED ORDER — LIDOCAINE HCL 1 % IJ SOLN
2.0000 mL | INTRAMUSCULAR | Status: AC | PRN
  Administered 2019-12-15: 2 mL

## 2019-12-15 MED ORDER — BUPIVACAINE HCL 0.5 % IJ SOLN
2.0000 mL | INTRAMUSCULAR | Status: AC | PRN
  Administered 2019-12-15: 2 mL via INTRA_ARTICULAR

## 2019-12-15 MED ORDER — PENNSAID 2 % EX SOLN: 2.0000 g | Freq: Two times a day (BID) | CUTANEOUS | 3 refills | Status: AC | PRN

## 2019-12-15 MED ORDER — METHYLPREDNISOLONE ACETATE 40 MG/ML IJ SUSP
40.0000 mg | INTRAMUSCULAR | Status: AC | PRN
  Administered 2019-12-15: 40 mg via INTRA_ARTICULAR

## 2019-12-15 MED ORDER — LIDOCAINE HCL 1 % IJ SOLN
2.0000 mL | INTRAMUSCULAR | Status: AC | PRN
  Administered 2019-12-15: 18:00:00 2 mL
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
## 2019-12-15 NOTE — Therapy (Signed)
[place], [place], [address] Phone: [phone]   Fax:  [phone]  Physical Therapy Treatment  Patient Details  Name: Tyler Wilkinson MRN: [id number] Date of Birth: 10/04/1955 Referring Provider (PT): Gilles Chiquito, MD   Encounter Date: 12/15/2019  PT End of Session - 12/15/19 1425    Visit Number  8    Number of Visits  15    Date for PT Re-Evaluation  01/19/20    Authorization Type  30 visit limit    PT Start Time  41   arrived a few minutes late   PT Stop Time  1500    PT Time Calculation (min)  34 min    Activity Tolerance  Patient limited by pain    Behavior During Therapy  [facility] for tasks assessed/performed       Past Medical History:  Diagnosis Date  . Asthma   . CAD (coronary artery disease)    stent RCA 2006 (other residual disease). /  nuclear 2008  no ischemia, inferolateral scar.  . Cataract   . Chronic bronchitis ([place])    "anytime I get sick it goes into bronchitis; probably get it q yr"  . Daily headache    "just recently" (05/10/2014)  . Dyslipidemia   . Ejection fraction    EF 55%,echo, 2008, inferolateral hypokinesis,  . Erectile dysfunction   . Gastroparesis   . Hypertension   . Myocardial infarction ([place]) 2006  . Severe obstructive sleep apnea-hypopnea syndrome 04/15/2009   Sleep study 03/15/2009 AHI of 82.2/hr oxygen desaturation nadir of 77%, loud snoring. Did not tolerate CPAP due to mask.  . Type 2 diabetes mellitus with complications ([place]) dx'd 4496    Past Surgical History:  Procedure Laterality Date  . CORONARY ANGIOPLASTY WITH STENT PLACEMENT  ~ 2006   "1"  . LAPAROSCOPIC APPENDECTOMY N/A 11/16/2014   Procedure: APPENDECTOMY LAPAROSCOPIC;  Surgeon: Greer Pickerel, MD;  Location: [place];  Service: General;  Laterality: N/A;  . UMBILICAL HERNIA REPAIR N/A 11/16/2014   Procedure: OPEN PRIMARY UMBILICAL HERNIA REPAIR ;  Surgeon: Greer Pickerel, MD;  Location: [facility];   Service: General;  Laterality: N/A;    There were no vitals filed for this visit.  Subjective Assessment - 12/15/19 1425    Subjective  Pt. sees Dr. Erlinda Hong for assessment of his knee pain later this PM after therapy appointment. Still noting some "pressure" in head and "wobbly" with balance but head symptoms improving a bit. Knee pain 5/10 this PM.    Limitations  Standing;Walking;Lifting    Currently in Pain?  Yes    Pain Score  5     Pain Location  Knee    Pain Orientation  Right;Left    Pain Descriptors / Indicators  Aching    Pain Type  Acute pain    Pain Onset  More than a month ago    Pain Frequency  Intermittent    Aggravating Factors   worse when getting up after sitting    Pain Relieving Factors  movement    Effect of Pain on Daily Activities  limits activity tolerance                       [facility] Adult PT Treatment/Exercise - 12/15/19 0001      Knee/Hip Exercises: Aerobic   Nustep  L4 x 5 min UE/LE   tried recumbent bike but pt. requested NUSTEP instead     Knee/Hip  Exercises: Standing   Heel Raises  20 reps    Heel Raises Limitations  on Airex    Terminal Knee Extension  AROM;Strengthening;Right;Left;2 sets;10 reps    Theraband Level (Terminal Knee Extension)  Level 3 (Green)    Hip Abduction  AROM;Stengthening;Right;Left;2 sets;10 reps    Abduction Limitations  green Theraband proximal to knees    Hip Extension  AROM;Stengthening;Right;Left;2 sets;10 reps    Extension Limitations  green Theraband proximal to knees    Functional Squat Limitations  partial squat at counter 2x10    Rocker Board  2 minutes    Rocker Board Limitations  1 min fw. rev on wooden board, 1 min lateral with blue "circle" board    SLS  3x10 sec ea. bilat. on Airex intermittent UE support    Other Standing Knee Exercises  head turns side to side and up and down x 10 ea. with feet together on Airex, Romberg eyes closed on Aitrex feet together 20 sec x 3    Other Standing Knee  Exercises  alt. SLS with "toe taps" to 8 in. step x 20                  PT Long Term Goals - 12/08/19 2033      PT LONG TERM GOAL #1   Title  Pt will be independent in his HEP and progresison.    Baseline  met for previous HEP, will update prn    Time  6    Period  Weeks    Status  On-going    Target Date  01/19/20      PT LONG TERM GOAL #2   Title  Pt will improve his cervical rotation to >/= 50 degrees bilaterally.    Baseline  met-see objective    Time  6    Period  Weeks    Status  Achieved      PT LONG TERM GOAL #3   Title  Pt will improve his BERG balance score to >/= 46 /56.    Baseline  43/56-improving but not met, 38/56 at previous assessment    Time  6    Period  Weeks    Status  On-going    Target Date  01/19/20      PT LONG TERM GOAL #4   Title  Pt will be able to amb [address] community surfaces with pain </= 4/10 in bilateral knees.    Baseline  knee pain 6-7/10    Time  6    Period  Weeks    Status  On-going    Target Date  01/19/20            Plan - 12/15/19 1439    Clinical Impression Statement  Fair status with mobility tolerance still limited by knee pain pending further assessment with orthopedist. For balance still challenged with higher level balance activities with demo of decreased proprioception and decreased use of vestibular inputs for balance. Plan continue PT but POC pending status after visit with ortho MD today.    Personal Factors and Comorbidities  Comorbidity 3+    Comorbidities  depression, asthma, CAD, MI, DM2, decreased Ejection Fraction 55%, cataracts, bronchitis, hyperlipidemia, ulnar neuropathy, bilateral carpel tunnel syndrome, and cardica stent placement    Examination-Activity Limitations  Squat;Stairs;Stand;Transfers;Sit;Carry;Reach Overhead;Lift;Bend;Bed Mobility    Examination-Participation Restrictions  Other;Driving    Stability/Clinical Decision Making  Evolving/Moderate complexity    Clinical Decision  Making  Moderate    Rehab Potential  Fair    PT Frequency  --   1-2x/week   PT Duration  6 weeks    PT Treatment/Interventions  ADLs/Self Care Home Management;Cryotherapy;Electrical Stimulation;Moist Heat;Traction;Ultrasound;Stair training;Functional mobility training;Gait training;Therapeutic activities;Therapeutic exercise;Balance training;Neuromuscular re-education;Patient/family education;Manual techniques;Taping;Dry needling;Passive range of motion;Vestibular    PT Next Visit Plan  await status from ortho visit, knee ROM and strengthening as tolerated, balance/proprioceptive training, further cervical tx. with manual and stretches as needed    PT Home Exercise Plan  [id number] (cervical retraction, UT stretch ,SAQ)    Consulted and Agree with Plan of Care  Patient       Patient will benefit from skilled therapeutic intervention in order to improve the following deficits and impairments:  Pain, Postural dysfunction, Decreased strength, Decreased activity tolerance, Decreased range of motion, Dizziness, Decreased balance, Difficulty walking  Visit Diagnosis: Cervicalgia  Acute pain of right knee  Acute pain of left knee  Difficulty in walking, not elsewhere classified     Problem List Patient Active Problem List   Diagnosis Date Noted  . Post concussive syndrome 08/02/2019  . Educated about COVID-19 virus infection 01/04/2019  . Flu-like symptoms 09/30/2018  . Benign localized prostatic hyperplasia with lower urinary tract symptoms (LUTS) 08/28/2018  . Tooth pain 05/23/2017  . Viral upper respiratory tract infection 05/21/2017  . Abdominal aortic atherosclerosis ([place]) 12/19/2016  . Vomiting 11/14/2016  . Tinea corporis 01/11/2016  . H/O umbilical hernia repair 82/50/5397  . Chronic back pain 10/07/2014  . Major depression, recurrent, chronic ([place]) 10/07/2014  . Essential hypertension 05/10/2014  . Sprain of right ankle 07/10/2012  . Diabetic gastroparesis ([place]) 05/05/2012   . Dental caries 05/05/2012  . Hyperlipidemia   . CAD (coronary artery disease), native coronary artery   . Bilateral carpal tunnel syndrome 06/09/2010  . ULNAR NEUROPATHY 06/09/2010  . Obstructive sleep apnea hypopnea, severe 04/15/2009  . CERUMEN IMPACTION 02/24/2009  . ASTHMA, INTRINSIC, WITH ACUTE EXACERBATION 12/24/2006  . Allergic rhinitis 07/28/2006  . ERECTILE DYSFUNCTION, ORGANIC 07/28/2006  . 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 ([place]) 06/03/2006    Beaulah Dinning, PT, DPT 12/15/19 2:58 PM [facility] [address] [place], [place], [address] Phone: [phone]   Fax:  [phone]  Name: Tyler Wilkinson MRN: [id number] Date of Birth: Mar 02, 1956

## 2019-12-16 NOTE — Telephone Encounter (Signed)
Provided support for Omnipod insulin pump operation. Patient is learning more about carbs in food and thinking about making change. Again requests telehealth appointment with Dr. Mikey Bussing. Will forward this to the front office to schedule

## 2019-12-21 NOTE — Telephone Encounter (Signed)
The patient was already sch with Dr. Mikey Bussing on 01/27/2020.  Also spoke with the patient and he is scheduled to come in tomorrow afternoon @ 2:15 with [facility].

## 2019-12-22 ENCOUNTER — Ambulatory Visit (INDEPENDENT_AMBULATORY_CARE_PROVIDER_SITE_OTHER): Payer: BC Managed Care – PPO | Admitting: Internal Medicine

## 2019-12-22 VITALS — BP 117/63 | HR 80 | Temp 98.1°F | Ht 73.0 in | Wt 228.1 lb

## 2019-12-22 DIAGNOSIS — Z794 Long term (current) use of insulin: Secondary | ICD-10-CM | POA: Diagnosis not present

## 2019-12-22 DIAGNOSIS — F0781 Postconcussional syndrome: Secondary | ICD-10-CM

## 2019-12-22 DIAGNOSIS — E1143 Type 2 diabetes mellitus with diabetic autonomic (poly)neuropathy: Secondary | ICD-10-CM | POA: Diagnosis not present

## 2019-12-22 DIAGNOSIS — Z9641 Presence of insulin pump (external) (internal): Secondary | ICD-10-CM

## 2019-12-22 LAB — GLUCOSE, CAPILLARY: Glucose-Capillary: 170 mg/dL — ABNORMAL HIGH (ref 70–99)

## 2019-12-22 LAB — POCT GLYCOSYLATED HEMOGLOBIN (HGB A1C): Hemoglobin A1C: 8 % — AB (ref 4.0–5.6)

## 2019-12-22 NOTE — Progress Notes (Signed)
   CC: bilateral knee pain and continued imbalance  HPI:  Tyler Wilkinson is a 64 y.o. male with PMHx of hypertension, CAD s/p stent in 2006, and diabetes mellitus presenting for bilateral knee pain and continued balance issues. Please see problem based charting for assessment and plan of patient's chronic conditions.   Past Medical History:  Diagnosis Date  . Asthma   . CAD (coronary artery disease)    stent RCA 2006 (other residual disease). /  nuclear 2008  no ischemia, inferolateral scar.  . Cataract   . Chronic bronchitis (HCC)    "anytime I get sick it goes into bronchitis; probably get it q yr"  . Daily headache    "just recently" (05/10/2014)  . Dyslipidemia   . Ejection fraction    EF 55%,echo, 2008, inferolateral hypokinesis,  . Erectile dysfunction   . Gastroparesis   . Hypertension   . Myocardial infarction (HCC) 2006  . Severe obstructive sleep apnea-hypopnea syndrome 04/15/2009   Sleep study 03/15/2009 AHI of 82.2/hr oxygen desaturation nadir of 77%, loud snoring. Did not tolerate CPAP due to mask.  . Type 2 diabetes mellitus with complications (HCC) dx'd 1972   Review of Systems:  Negative except as stated in HPI.  Physical Exam:  Vitals:   12/22/19 1447 12/22/19 1449  BP:  117/63  Pulse:  80  Temp:  98.1 F (36.7 C)  TempSrc:  Oral  SpO2:  94%  Weight: 228 lb 11.2 oz (103.7 kg) 228 lb 1.6 oz (103.5 kg)  Height: 6\' 1"  (1.854 m) 6\' 1"  (1.854 m)   Physical Exam Constitutional:      General: He is not in acute distress.    Appearance: Normal appearance. He is not diaphoretic.  HENT:     Head: Normocephalic and atraumatic.  Eyes:     General: No scleral icterus.    Extraocular Movements: Extraocular movements intact.     Pupils: Pupils are equal, round, and reactive to light.  Cardiovascular:     Rate and Rhythm: Normal rate and regular rhythm.     Pulses: Normal pulses.     Heart sounds: Normal heart sounds.  Pulmonary:     Effort: Pulmonary effort  is normal.     Breath sounds: Normal breath sounds. No wheezing or rales.  Abdominal:     General: Bowel sounds are normal. There is no distension.     Palpations: Abdomen is soft.     Tenderness: There is no abdominal tenderness.  Musculoskeletal:        General: No swelling or tenderness. Normal range of motion.     Cervical back: Normal range of motion and neck supple. No tenderness.  Skin:    General: Skin is warm and dry.     Capillary Refill: Capillary refill takes less than 2 seconds.  Neurological:     Mental Status: He is alert and oriented to person, place, and time. Mental status is at baseline.     Cranial Nerves: No cranial nerve deficit.     Sensory: No sensory deficit.     Motor: No weakness.     Coordination: Coordination abnormal.     Gait: Gait abnormal.      Assessment & Plan:   See Encounters Tab for problem based charting.  Patient seen with Dr. 

## 2019-12-22 NOTE — Patient Instructions (Addendum)
Mr. Onorato,  It was a pleasure seeing you in clinic. Today we discussed:  Continued imbalance: We will give you a work note until your next appointment. We will discuss with physical therapy regarding further plans.    Diabetes: We are checking A1c today. Please continue with the Omnipad and ensure you are eating 2-3 meals per day.    Please contact us if you have any questions or concerns.    Thank you!

## 2019-12-24 NOTE — Assessment & Plan Note (Addendum)
HbA1c 8.0 at this visit. Patient reports doing well with his CBG sensor and omnipod insulin pump.  He notes readings ranging usually in the 100-160s. However, did note CBG of 59 overnight.  Patient notes that he has not been eating very well and usually only eats 1 or 2 meals per day.  He is mostly eating bologna and mustard sandwiches.  Patient counseled on nutrition.  He follows with Lupita Leash closely.  Plan Continue CGM and insulin administration via Omnipod F/u with Lupita Leash
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
## 2019-12-27 NOTE — Progress Notes (Signed)
Internal Medicine Clinic Attending  I saw and evaluated the patient.  I personally confirmed the key portions of the history and exam documented by Dr. Mcarthur Rossetti and I reviewed pertinent patient test results.  The assessment, diagnosis, and plan were formulated together and I agree with the documentation in the resident's note.    I discussed with his physical therapist, balance and knee pain seems to be his main issues right now, we will continue to work on coping mechanisms and balance with PT and follow up with ortho for knee issues.

## 2019-12-29 ENCOUNTER — Ambulatory Visit: Payer: BC Managed Care – PPO | Attending: Neurology | Admitting: Physical Therapy

## 2019-12-29 ENCOUNTER — Telehealth: Payer: Self-pay | Admitting: Dietician

## 2019-12-29 ENCOUNTER — Encounter: Payer: Self-pay | Admitting: Physical Therapy

## 2019-12-29 ENCOUNTER — Other Ambulatory Visit: Payer: Self-pay

## 2019-12-29 DIAGNOSIS — M542 Cervicalgia: Secondary | ICD-10-CM | POA: Insufficient documentation

## 2019-12-29 DIAGNOSIS — M25561 Pain in right knee: Secondary | ICD-10-CM | POA: Diagnosis not present

## 2019-12-29 DIAGNOSIS — M25562 Pain in left knee: Secondary | ICD-10-CM | POA: Diagnosis not present

## 2019-12-29 DIAGNOSIS — R262 Difficulty in walking, not elsewhere classified: Secondary | ICD-10-CM

## 2019-12-29 NOTE — Therapy (Signed)
[place], [place], [address] Phone: [phone]   Fax:  [phone]  Physical Therapy Treatment  Patient Details  Name: Tyler Wilkinson MRN: [id number] Date of Birth: 1956-01-21 Referring Provider (PT): Gilles Chiquito, MD   Encounter Date: 12/29/2019  PT End of Session - 12/29/19 1349    Visit Number  9    Number of Visits  15    Date for PT Re-Evaluation  01/19/20    Authorization Type  30 visit limit    PT Start Time  1328    PT Stop Time  1413    PT Time Calculation (min)  45 min    Activity Tolerance  Patient limited by pain    Behavior During Therapy  [facility] for tasks assessed/performed       Past Medical History:  Diagnosis Date  . Asthma   . CAD (coronary artery disease)    stent RCA 2006 (other residual disease). /  nuclear 2008  no ischemia, inferolateral scar.  . Cataract   . Chronic bronchitis ([place])    "anytime I get sick it goes into bronchitis; probably get it q yr"  . Daily headache    "just recently" (05/10/2014)  . Dyslipidemia   . Ejection fraction    EF 55%,echo, 2008, inferolateral hypokinesis,  . Erectile dysfunction   . Gastroparesis   . Hypertension   . Myocardial infarction ([place]) 2006  . Severe obstructive sleep apnea-hypopnea syndrome 04/15/2009   Sleep study 03/15/2009 AHI of 82.2/hr oxygen desaturation nadir of 77%, loud snoring. Did not tolerate CPAP due to mask.  . Type 2 diabetes mellitus with complications ([place]) dx'd 1031    Past Surgical History:  Procedure Laterality Date  . CORONARY ANGIOPLASTY WITH STENT PLACEMENT  ~ 2006   "1"  . LAPAROSCOPIC APPENDECTOMY N/A 11/16/2014   Procedure: APPENDECTOMY LAPAROSCOPIC;  Surgeon: Greer Pickerel, MD;  Location: [place];  Service: General;  Laterality: N/A;  . UMBILICAL HERNIA REPAIR N/A 11/16/2014   Procedure: OPEN PRIMARY UMBILICAL HERNIA REPAIR ;  Surgeon: Greer Pickerel, MD;  Location: [facility];  Service: General;  Laterality: N/A;     There were no vitals filed for this visit.  Subjective Assessment - 12/29/19 1330    Subjective  Pt. saw Dr. Erlinda Hong for his knee pain and had injections bilat. He reports initially did not have relief after injections but now potentially some mild improvement.He had X-rays which showed mild medial compartment OA. He has still been out of work (out at least until June).    Limitations  Standing;Walking;Lifting    Currently in Pain?  Yes    Pain Score  --   3-4/10   Pain Location  Knee    Pain Orientation  Right;Left    Pain Descriptors / Indicators  Dull    Pain Type  Chronic pain    Pain Onset  More than a month ago    Pain Frequency  Intermittent    Aggravating Factors   stairs, when getting up after sitting    Effect of Pain on Daily Activities  limits activity tolerance.                        [place] Adult PT Treatment/Exercise - 12/29/19 0001      Knee/Hip Exercises: Aerobic   Nustep  L4 x 6 min UE/LE      Knee/Hip Exercises: Machines for Strengthening   Cybex Leg Press  45  lbs. bilat. legs 2x10      Knee/Hip Exercises: Standing   Heel Raises  20 reps    Heel Raises Limitations  on Airex    Terminal Knee Extension  AROM;Strengthening;Right;Left;2 sets;10 reps    Theraband Level (Terminal Knee Extension)  Level 3 (Green)    Hip Abduction  AROM;Stengthening;Right;Left;2 sets;10 reps    Abduction Limitations  green Theraband proximal to knees    Forward Step Up  Right;Left;2 sets;10 reps;Step Height: 6"    Functional Squat Limitations  partial squat at counter 2x10    Rocker Board  2 minutes    Rocker Board Limitations  1 min fw. rev on wooden board, 1 min lateral with blue "circle" board    SLS  3 x 5-10 sec holds. ea bilat. on Airex    Other Standing Knee Exercises  Romberg eyes open on dynadisc 20 sec x 3      Knee/Hip Exercises: Seated   Long Arc Quad  AROM;Strengthening;Both;2 sets;10 reps    Long Arc Quad Weight  5 lbs.      Knee/Hip Exercises:  Supine   Bridges  AROM;Strengthening;Both;20 reps      Ankle Exercises: Stretches   Slant Board Stretch  3 reps;20 seconds             PT Education - 12/29/19 1411    Education Details  POC, HEP updates    Person(s) Educated  Patient    Methods  Explanation    Comprehension  Verbalized understanding          PT Long Term Goals - 12/08/19 2033      PT LONG TERM GOAL #1   Title  Pt will be independent in his HEP and progresison.    Baseline  met for previous HEP, will update prn    Time  6    Period  Weeks    Status  On-going    Target Date  01/19/20      PT LONG TERM GOAL #2   Title  Pt will improve his cervical rotation to >/= 50 degrees bilaterally.    Baseline  met-see objective    Time  6    Period  Weeks    Status  Achieved      PT LONG TERM GOAL #3   Title  Pt will improve his BERG balance score to >/= 46 /56.    Baseline  43/56-improving but not met, 38/56 at previous assessment    Time  6    Period  Weeks    Status  On-going    Target Date  01/19/20      PT LONG TERM GOAL #4   Title  Pt will be able to amb 1038fet community surfaces with pain </= 4/10 in bilateral knees.    Baseline  knee pain 6-7/10    Time  6    Period  Weeks    Status  On-going    Target Date  01/19/20            Plan - 12/29/19 1414    Clinical Impression Statement  Pt. able to tolerate more closed chain activities for knee strengthening today with less knee pain with step ups and addition of leg press. He continues to demonstrate decreased balance/propriopception with challenge particularly on uneven surfaces. Discussed status with pt. and he is potentially interested in trying aquatics so will check regarding potential for referral for this though availability may be limited.    Personal Factors  and Comorbidities  Comorbidity 3+    Comorbidities  depression, asthma, CAD, MI, DM2, decreased Ejection Fraction 55%, cataracts, bronchitis, hyperlipidemia, ulnar neuropathy,  bilateral carpel tunnel syndrome, and cardica stent placement    Examination-Activity Limitations  Squat;Stairs;Stand;Transfers;Sit;Carry;Reach Overhead;Lift;Bend;Bed Mobility    Examination-Participation Restrictions  Other;Driving    Stability/Clinical Decision Making  Evolving/Moderate complexity    Clinical Decision Making  Moderate    Rehab Potential  Fair    PT Frequency  --   1-2x/week   PT Duration  6 weeks    PT Treatment/Interventions  ADLs/Self Care Home Management;Cryotherapy;Electrical Stimulation;Moist Heat;Traction;Ultrasound;Stair training;Functional mobility training;Gait training;Therapeutic activities;Therapeutic exercise;Balance training;Neuromuscular re-education;Patient/family education;Manual techniques;Taping;Dry needling;Passive range of motion;Vestibular    PT Next Visit Plan  continue work on knee/hip strengthening, balance and proprioceptive challenges    PT Home Exercise Plan  [id number]    Consulted and Agree with Plan of Care  Patient       Patient will benefit from skilled therapeutic intervention in order to improve the following deficits and impairments:  Pain, Postural dysfunction, Decreased strength, Decreased activity tolerance, Decreased range of motion, Dizziness, Decreased balance, Difficulty walking  Visit Diagnosis: Cervicalgia  Acute pain of right knee  Acute pain of left knee  Difficulty in walking, not elsewhere classified     Problem List Patient Active Problem List   Diagnosis Date Noted  . Post concussive syndrome 08/02/2019  . Educated about COVID-19 virus infection 01/04/2019  . Flu-like symptoms 09/30/2018  . Benign localized prostatic hyperplasia with lower urinary tract symptoms (LUTS) 08/28/2018  . Tooth pain 05/23/2017  . Viral upper respiratory tract infection 05/21/2017  . Abdominal aortic atherosclerosis ([place]) 12/19/2016  . Vomiting 11/14/2016  . Tinea corporis 01/11/2016  . H/O umbilical hernia repair 50/38/8828  .  Chronic back pain 10/07/2014  . Major depression, recurrent, chronic ([place]) 10/07/2014  . Essential hypertension 05/10/2014  . Sprain of right ankle 07/10/2012  . Diabetic gastroparesis ([place]) 05/05/2012  . Dental caries 05/05/2012  . Hyperlipidemia   . CAD (coronary artery disease), native coronary artery   . Bilateral carpal tunnel syndrome 06/09/2010  . ULNAR NEUROPATHY 06/09/2010  . Obstructive sleep apnea hypopnea, severe 04/15/2009  . CERUMEN IMPACTION 02/24/2009  . ASTHMA, INTRINSIC, WITH ACUTE EXACERBATION 12/24/2006  . Allergic rhinitis 07/28/2006  . ERECTILE DYSFUNCTION, ORGANIC 07/28/2006  . 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 [place], [place], [address] Phone: [phone]   Fax:  [phone]  Name: Tyler Wilkinson MRN: [id number] Date of Birth: 03-03-1956

## 2019-12-29 NOTE — Telephone Encounter (Signed)
Tyler Wilkinson states that he has already used sample vial of Novolog in April. He did not notice that it affected his blood sugars.   He states that his blood sugar is 114 now, 300 last night.  goes to bed 220, wakes up 1-2 hours later still in 200s. Wants help with addressing pattern of elevated blood sugars at night  found on recent CGM upload.  He agreed to come in with his pump and Dexcom so we can review what he is eating/drinking at night and how he is carb counting. He also states that he has had a few low blood sugars due to waiting too long to eat after bolusing.

## 2019-12-31 ENCOUNTER — Other Ambulatory Visit: Payer: Self-pay | Admitting: *Deleted

## 2019-12-31 DIAGNOSIS — J302 Other seasonal allergic rhinitis: Secondary | ICD-10-CM

## 2019-12-31 DIAGNOSIS — I10 Essential (primary) hypertension: Secondary | ICD-10-CM

## 2019-12-31 MED ORDER — CARVEDILOL 6.25 MG PO TABS: 6.2500 mg | ORAL_TABLET | Freq: Two times a day (BID) | ORAL | 3 refills | Status: DC

## 2019-12-31 MED ORDER — BENAZEPRIL HCL 40 MG PO TABS: 40.0000 mg | ORAL_TABLET | Freq: Every day | ORAL | 3 refills | Status: DC

## 2019-12-31 MED ORDER — LEVOCETIRIZINE DIHYDROCHLORIDE 5 MG PO TABS: 5.0000 mg | ORAL_TABLET | Freq: Every evening | ORAL | 3 refills | Status: AC

## 2019-12-31 NOTE — Telephone Encounter (Signed)
Next appt scheduled 6/10 with PCP. 

## 2020-01-05 ENCOUNTER — Telehealth: Payer: Self-pay | Admitting: Dietician

## 2020-01-05 NOTE — Telephone Encounter (Addendum)
Tyler Wilkinson called for [facility] showing low alert. He treated the lows several times last night and sensor kept reading low. He checked using his meter at 630am suspecting a sensor error and his blood sugar was  over 600 on his meter, 572 on his second check. He took 17 units and his Blood sugar is now 195. Patient advised t use his meter today, insert new sensor drink plenty of water, check expiration date on transmitter and call Dexcom Support to reports the incident( and hopefully have sensors replaced) . He verbalized understanding.

## 2020-01-06 ENCOUNTER — Other Ambulatory Visit: Payer: Self-pay

## 2020-01-06 ENCOUNTER — Ambulatory Visit: Payer: BC Managed Care – PPO | Admitting: Physical Therapy

## 2020-01-06 ENCOUNTER — Encounter: Payer: Self-pay | Admitting: Physical Therapy

## 2020-01-06 DIAGNOSIS — R262 Difficulty in walking, not elsewhere classified: Secondary | ICD-10-CM | POA: Diagnosis not present

## 2020-01-06 DIAGNOSIS — M25562 Pain in left knee: Secondary | ICD-10-CM

## 2020-01-06 DIAGNOSIS — M542 Cervicalgia: Secondary | ICD-10-CM | POA: Diagnosis not present

## 2020-01-06 DIAGNOSIS — M25561 Pain in right knee: Secondary | ICD-10-CM | POA: Diagnosis not present

## 2020-01-06 NOTE — Therapy (Signed)
[place], [place], [address] Phone: [phone]   Fax:  [phone]  Physical Therapy Treatment  Patient Details  Name: Tyler Wilkinson MRN: [id number] Date of Birth: Apr 25, 1956 Referring Provider (PT): Gilles Chiquito, MD   Encounter Date: 01/06/2020  PT End of Session - 01/06/20 1505    Visit Number  10    Number of Visits  15    Date for PT Re-Evaluation  01/19/20    Authorization Type  30 visit limit    PT Start Time  1419    PT Stop Time  1459    PT Time Calculation (min)  40 min    Activity Tolerance  Patient tolerated treatment well    Behavior During Therapy  [facility] for tasks assessed/performed       Past Medical History:  Diagnosis Date  . Asthma   . CAD (coronary artery disease)    stent RCA 2006 (other residual disease). /  nuclear 2008  no ischemia, inferolateral scar.  . Cataract   . Chronic bronchitis ([place])    "anytime I get sick it goes into bronchitis; probably get it q yr"  . Daily headache    "just recently" (05/10/2014)  . Dyslipidemia   . Ejection fraction    EF 55%,echo, 2008, inferolateral hypokinesis,  . Erectile dysfunction   . Gastroparesis   . Hypertension   . Myocardial infarction ([place]) 2006  . Severe obstructive sleep apnea-hypopnea syndrome 04/15/2009   Sleep study 03/15/2009 AHI of 82.2/hr oxygen desaturation nadir of 77%, loud snoring. Did not tolerate CPAP due to mask.  . Type 2 diabetes mellitus with complications ([place]) dx'd 8453    Past Surgical History:  Procedure Laterality Date  . CORONARY ANGIOPLASTY WITH STENT PLACEMENT  ~ 2006   "1"  . LAPAROSCOPIC APPENDECTOMY N/A 11/16/2014   Procedure: APPENDECTOMY LAPAROSCOPIC;  Surgeon: Greer Pickerel, MD;  Location: [facility];  Service: General;  Laterality: N/A;  . UMBILICAL HERNIA REPAIR N/A 11/16/2014   Procedure: OPEN PRIMARY UMBILICAL HERNIA REPAIR ;  Surgeon: Greer Pickerel, MD;  Location: [facility];  Service: General;   Laterality: N/A;    There were no vitals filed for this visit.  Subjective Assessment - 01/06/20 1424    Subjective  Knee pain a little better but still reports pain 4/10 in both knees. Pt. reports headache this past week affecting temporal region into vertex region of head though better with this today. He sees Dr. Heber [place] 01/27/20 and reports he plans to see about returning to work at this visit. He has been interested in aquatics but discussed no current options available through this location so he may see about community options for aquatics to try this independently.    Currently in Pain?  Yes    Pain Score  4     Pain Location  Knee    Pain Orientation  Right;Left    Pain Descriptors / Indicators  Dull    Pain Type  Chronic pain    Pain Onset  More than a month ago    Pain Frequency  Intermittent    Aggravating Factors   stairs, getting up after sitting    Pain Relieving Factors  activity    Effect of Pain on Daily Activities  limits activity tolerance         [facility] PT Assessment - 01/06/20 0001      Berg Balance Test   Sit to Stand  Able to stand  independently  using hands    Standing Unsupported  Able to stand safely 2 minutes    Sitting with Back Unsupported but Feet Supported on Floor or Stool  Able to sit safely and securely 2 minutes    Stand to Sit  Sits safely with minimal use of hands    Transfers  Able to transfer safely, definite need of hands    Standing Unsupported with Eyes Closed  Able to stand 10 seconds safely    Standing Unsupported with Feet Together  Able to place feet together independently and stand 1 minute safely    From Standing, Reach Forward with Outstretched Arm  Can reach confidently >25 cm (10")    From Standing Position, Pick up Object from Floor  Able to pick up shoe safely and easily    From Standing Position, Turn to Look Behind Over each Shoulder  Looks behind from both sides and weight shifts well    Turn 360 Degrees  Able to turn 360  degrees safely in 4 seconds or less    Standing Unsupported, Alternately Place Feet on Step/Stool  Able to stand independently and safely and complete 8 steps in 20 seconds    Standing Unsupported, One Foot in Front  Able to place foot tandem independently and hold 30 seconds    Standing on One Leg  Able to lift leg independently and hold > 10 seconds    Total Score  54                    [facility] Adult PT Treatment/Exercise - 01/06/20 0001      Knee/Hip Exercises: Aerobic   Nustep  L4 x 6 min UE/LE      Knee/Hip Exercises: Machines for Strengthening   Cybex Leg Press  45 lbs. bilat. legs 2x10      Knee/Hip Exercises: Standing   Heel Raises  20 reps    Heel Raises Limitations  on Airex    Hip Abduction  AROM;Stengthening;Right;Left;2 sets;10 reps    Abduction Limitations  green Theraband proximal to knees    Forward Step Up  Right;Left;2 sets;10 reps;Step Height: 6"    Functional Squat Limitations  TRX partial squat 2x10    Rocker Board  2 minutes    Rocker Board Limitations  1 min fw. rev on wooden board, 1 min lateral with blue "circle" board    Other Standing Knee Exercises  Romberg Airex eyes closed 20 sec x 2    Other Standing Knee Exercises  Berg components for balance      Knee/Hip Exercises: Supine   Bridges with Ball Squeeze  AROM;Strengthening;Both;15 reps    Straight Leg Raises  AROM;Strengthening;Both;15 reps    Other Supine Knee/Hip Exercises  clamshells green band 2x10                  PT Long Term Goals - 01/06/20 1503      PT LONG TERM GOAL #1   Title  Pt will be independent in his HEP and progresison.    Baseline  met for previous HEP, will update prn    Time  6    Period  Weeks    Status  On-going      PT LONG TERM GOAL #2   Title  Pt will improve his cervical rotation to >/= 50 degrees bilaterally.    Baseline  met-see objective    Time  6    Period  Weeks    Status  Achieved  PT LONG TERM GOAL #3   Title  Pt will improve  his BERG balance score to >/= 46 /56.    Baseline  54/56    Time  6    Period  Weeks    Status  Achieved      PT LONG TERM GOAL #4   Title  Pt will be able to amb 1017fet community surfaces with pain </= 4/10 in bilateral knees.    Baseline  4/10 pain today but recently/intermittently higher    Time  6    Period  Weeks    Status  On-going            Plan - 01/06/20 1457    Clinical Impression Statement  Mr. SLuberdemonstrates a significant improvement in balance from previous recent status wth Berg score improved from 43/56 to 54/56 today. He still has moderate knee pain bilaterally but his activity tolerance has been improving with this as well recently. He will follow up with Dr. HHeber [place]6/10/21 re: work status and plan continue PT for further progress with strength/balance and prn to address neck pain and headaches.    Personal Factors and Comorbidities  Comorbidity 3+    Comorbidities  depression, asthma, CAD, MI, DM2, decreased Ejection Fraction 55%, cataracts, bronchitis, hyperlipidemia, ulnar neuropathy, bilateral carpel tunnel syndrome, and cardica stent placement    Examination-Activity Limitations  Squat;Stairs;Stand;Transfers;Sit;Carry;Reach Overhead;Lift;Bend;Bed Mobility    Examination-Participation Restrictions  Other;Driving    Stability/Clinical Decision Making  Evolving/Moderate complexity    Clinical Decision Making  Moderate    Rehab Potential  Fair    PT Frequency  --   1-2x/week   PT Duration  6 weeks    PT Treatment/Interventions  ADLs/Self Care Home Management;Cryotherapy;Electrical Stimulation;Moist Heat;Traction;Ultrasound;Stair training;Functional mobility training;Gait training;Therapeutic activities;Therapeutic exercise;Balance training;Neuromuscular re-education;Patient/family education;Manual techniques;Taping;Dry needling;Passive range of motion;Vestibular    PT Next Visit Plan  continue work on knee/hip strengthening, balance and proprioceptive  challenges    PT Home Exercise Plan  [id number]    Consulted and Agree with Plan of Care  Patient       Patient will benefit from skilled therapeutic intervention in order to improve the following deficits and impairments:  Pain, Postural dysfunction, Decreased strength, Decreased activity tolerance, Decreased range of motion, Dizziness, Decreased balance, Difficulty walking  Visit Diagnosis: Cervicalgia  Acute pain of right knee  Acute pain of left knee  Difficulty in walking, not elsewhere classified     Problem List Patient Active Problem List   Diagnosis Date Noted  . Post concussive syndrome 08/02/2019  . Educated about COVID-19 virus infection 01/04/2019  . Flu-like symptoms 09/30/2018  . Benign localized prostatic hyperplasia with lower urinary tract symptoms (LUTS) 08/28/2018  . Tooth pain 05/23/2017  . Viral upper respiratory tract infection 05/21/2017  . Abdominal aortic atherosclerosis ([place] 12/19/2016  . Vomiting 11/14/2016  . Tinea corporis 01/11/2016  . H/O umbilical hernia repair 016/38/4536 . Chronic back pain 10/07/2014  . Major depression, recurrent, chronic ([place] 10/07/2014  . Essential hypertension 05/10/2014  . Sprain of right ankle 07/10/2012  . Diabetic gastroparesis ([place] 05/05/2012  . Dental caries 05/05/2012  . Hyperlipidemia   . CAD (coronary artery disease), native coronary artery   . Bilateral carpal tunnel syndrome 06/09/2010  . ULNAR NEUROPATHY 06/09/2010  . Obstructive sleep apnea hypopnea, severe 04/15/2009  . CERUMEN IMPACTION 02/24/2009  . ASTHMA, INTRINSIC, WITH ACUTE EXACERBATION 12/24/2006  . Allergic rhinitis 07/28/2006  . ERECTILE DYSFUNCTION, ORGANIC 07/28/2006  . Type II diabetes mellitus  with peripheral autonomic neuropathy ([place]) 06/03/2006    Beaulah Dinning, PT, DPT 01/06/20 3:08 PM  [facility] [address] [place], [place], [address] Phone: [phone]   Fax:   [phone]  Name: Tyler Wilkinson MRN: [id number] Date of Birth: [birth year]

## 2020-01-12 DIAGNOSIS — H57813 Brow ptosis, bilateral: Secondary | ICD-10-CM | POA: Diagnosis not present

## 2020-01-12 DIAGNOSIS — H02423 Myogenic ptosis of bilateral eyelids: Secondary | ICD-10-CM | POA: Diagnosis not present

## 2020-01-12 DIAGNOSIS — H40003 Preglaucoma, unspecified, bilateral: Secondary | ICD-10-CM | POA: Diagnosis not present

## 2020-01-20 ENCOUNTER — Other Ambulatory Visit: Payer: Self-pay | Admitting: *Deleted

## 2020-01-20 MED ORDER — TRULICITY 1.5 MG/0.5ML ~~LOC~~ SOAJ: 1.5000 mg | SUBCUTANEOUS | 5 refills | Status: DC

## 2020-01-27 ENCOUNTER — Other Ambulatory Visit: Payer: Self-pay

## 2020-01-27 ENCOUNTER — Ambulatory Visit (INDEPENDENT_AMBULATORY_CARE_PROVIDER_SITE_OTHER): Payer: BC Managed Care – PPO | Admitting: Dietician

## 2020-01-27 ENCOUNTER — Encounter: Payer: Self-pay | Admitting: Internal Medicine

## 2020-01-27 ENCOUNTER — Ambulatory Visit (INDEPENDENT_AMBULATORY_CARE_PROVIDER_SITE_OTHER): Payer: BC Managed Care – PPO | Admitting: Internal Medicine

## 2020-01-27 VITALS — BP 114/70 | HR 88 | Temp 98.6°F | Ht 72.0 in | Wt 229.1 lb

## 2020-01-27 DIAGNOSIS — E1143 Type 2 diabetes mellitus with diabetic autonomic (poly)neuropathy: Secondary | ICD-10-CM

## 2020-01-27 DIAGNOSIS — I700 Atherosclerosis of aorta: Secondary | ICD-10-CM

## 2020-01-27 DIAGNOSIS — E1165 Type 2 diabetes mellitus with hyperglycemia: Secondary | ICD-10-CM

## 2020-01-27 DIAGNOSIS — N401 Enlarged prostate with lower urinary tract symptoms: Secondary | ICD-10-CM

## 2020-01-27 DIAGNOSIS — Z713 Dietary counseling and surveillance: Secondary | ICD-10-CM

## 2020-01-27 DIAGNOSIS — I10 Essential (primary) hypertension: Secondary | ICD-10-CM

## 2020-01-27 DIAGNOSIS — H6121 Impacted cerumen, right ear: Secondary | ICD-10-CM

## 2020-01-27 DIAGNOSIS — K3184 Gastroparesis: Secondary | ICD-10-CM

## 2020-01-27 DIAGNOSIS — F0781 Postconcussional syndrome: Secondary | ICD-10-CM

## 2020-01-27 DIAGNOSIS — F339 Major depressive disorder, recurrent, unspecified: Secondary | ICD-10-CM

## 2020-01-27 MED ORDER — ROSUVASTATIN CALCIUM 5 MG PO TABS: 5.0000 mg | ORAL_TABLET | Freq: Every day | ORAL | 3 refills | Status: DC

## 2020-01-27 NOTE — Patient Instructions (Addendum)
Tyler Wilkinson,  We discussed INSULIN ON BOARD today and You increased your basal rate to 1.5 u/hour today on your Omnipod insulin pump.  This should help lower your blood sugars.   Please begin trying to read more labels to enter the appropriate amount of carbohydrate in your bolus calculator.   I made a follow up appointment for 4 weeks (see below) so we can review how this affects your blood sugars.   Lupita Leash [phone]

## 2020-01-27 NOTE — Progress Notes (Signed)
  Medical Nutrition Therapy:  Appt start time: 1100 end time:  1115. Total time: 15 Visit # 1   Assessment:  Primary concerns today: glycemic control  This is a follow up visit for glycemic control. He uses an Omnipod insulin pump to adminster his insulin and a Dexcom Continuous glucose monitoring. His pump setting and history were reviewed today. His Dexcom 14 day Continuous glucose monitoring download was reviewed.  "I think I need more of that long acting insulin" . His insulin ratio and blood sugars show that he does likely need more basal insulin as his basal insulin is less than his bolus insulin  His pump settings today are: Basal is 1.3 u/hr ICR 7.5 Correction 28 14 day total daily dose average 64.8u/day with (28.5u/36.3) (44%/56%)  CGM Results from download:  active 88.1% of time  Average glucose:   207 mg/dL for 14 days  Glucose management indicator:   NA %  Time in range (70-180 mg/dL):   83.6 %   (Goal >62%)  Time High (181-250 mg/dL):   63 %   (Goal < 94%)  Time Very High (>250 mg/dL):    76.5 %   (Goal < 5%)  Time Low (54-69 mg/dL):   0.2 %   (Goal <4%)  Time Very Low (<54 mg/dL):   0 %   (Goal <6%)  Coefficient of variation:   29.8 %   (Goal <36%)   Estimated body mass index is 31.07 kg/m as calculated from the following:   Height as of an earlier encounter on 01/27/20: 6' (1.829 m).   Weight as of an earlier encounter on 01/27/20: 229 lb 1.6 oz (103.9 kg). Lab Results  Component Value Date   HGBA1C 8.0 (A) 12/22/2019   HGBA1C 7.7 (A) 09/15/2019   HGBA1C 7.6 (A) 05/21/2019   HGBA1C 8.2 (H) 12/30/2018   HGBA1C 8.5 (A) 08/28/2018    Wt Readings from Last 5 Encounters:  01/27/20 229 lb 1.6 oz (103.9 kg)  12/22/19 228 lb 1.6 oz (103.5 kg)  12/15/19 220 lb (99.8 kg)  12/08/19 227 lb (103 kg)  10/26/19 227 lb (103 kg)    DIETARY INTAKE: Usual eating pattern includes 1-2 PM, 11 PM, 2 AM meals. Rinks 3-4 cans of soda per day, says he is no longer reading labels for  carbs but has some foods memorized  Usual physical activity: starting back to work next week  Progress Towards Goal(s):  In progress.   Nutritional Diagnosis:  NB-1.1 Food and nutrition-related knowledge deficit As related to lack of sufficient pump and dexcom training.  As evidenced by his report and asking for help with changing his pump settings.    Intervention:  Nutrition education about insulin on board, review of dexcom dowbnload and how to change basal rates in his pump Action Goal: Change today is to increase the basal insulin to 1.5u/hr this will increase is ratio to   Outcome goal: improved blood sugars Coordination of care: discuss with Dr. Mikey Bussing  Teaching Method Utilized: Visual, Auditory,Hands on Handouts given during visit include:none Barriers to learning/adherence to lifestyle change: competing values and attention Demonstrated degree of understanding via:  Teach Back   Monitoring/Evaluation:  Dietary intake, exercise, pump and dexcom, and body weight in 4 week(s)  Norm Parcel, RD 01/27/2020 11:51 AM. .

## 2020-01-28 NOTE — Progress Notes (Signed)
°  Subjective:  HPI: [name] a 64 y.o. male who presents for f/u HTN, DM, Post concussive syndrome/headache  Please see Assessment and Plan below for the status of his chronic medical problems.  Objective:  Physical Exam: Vitals:   01/27/20 0929  BP: 114/70  Pulse: 88  Temp: 98.6 F (37 C)  TempSrc: Oral  SpO2: 96%  Weight: 229 lb 1.6 oz (103.9 kg)  Height: 6' (1.829 m)   Body mass index is 31.07 kg/m. Physical Exam Vitals and nursing note reviewed.  Constitutional:      Appearance: Normal appearance. He is obese.  HENT:     Right Ear: There is impacted cerumen.     Left Ear: Tympanic membrane, ear canal and external ear normal.     Mouth/Throat:     Mouth: Mucous membranes are moist.  Cardiovascular:     Rate and Rhythm: Normal rate and regular rhythm.     Pulses: Normal pulses.     Heart sounds: Normal heart sounds.  Pulmonary:     Effort: Pulmonary effort is normal.     Breath sounds: Normal breath sounds.  Abdominal:     General: Abdomen is flat.     Palpations: Abdomen is soft.  Skin:    General: Skin is warm.  Neurological:     General: No focal deficit present.     Mental Status: He is alert.     Gait: Gait normal.  Psychiatric:        Mood and Affect: Mood normal.        Behavior: Behavior normal.        Thought Content: Thought content normal.        Judgment: Judgment normal.    Assessment & Plan:  See Encounters Tab for problem based charting.  Medications Ordered Meds ordered this encounter  Medications   rosuvastatin (CRESTOR) 5 MG tablet    Sig: Take 1 tablet (5 mg total) by mouth daily.    Dispense:  90 tablet    Refill:  3   Other Orders Orders Placed This Encounter  Procedures   Ambulatory referral to Urology    Referral Priority:   Routine    Referral Type:   Consultation    Referral Reason:   Specialty Services Required    Requested Specialty:   Urology    Number of Visits Requested:   1   Ambulatory referral  to ENT    Referral Priority:   Routine    Referral Type:   Consultation    Referral Reason:   Specialty Services Required    Requested Specialty:   Otolaryngology    Number of Visits Requested:   1   Follow Up: Return in about 3 months (around 04/28/2020).

## 2020-01-28 NOTE — Assessment & Plan Note (Signed)
HPI: Reports some decreased hearing in his right ear.  Previously has history of right ear cerumen impaction.  Assessment cerumen impaction of right ear unable to be cleared with lavage  Plan Was unable to clear cerumen impaction to visualize membrane or improve hearing with lavage.  Therefore I will refer her over to ENT

## 2020-01-28 NOTE — Assessment & Plan Note (Signed)
HPI: He reports he continues to have occasional dribbling and incomplete urination.  For the most part if he takes 0.8 mg of tamsulosin that his complaints are only minor and is overall well-tolerated.  However he does report to me 2 episodes of orthostasis symptoms he is on a couple of blood pressure medications that could be contributing.  However muscle concerned about his tamsulosin contributing.  He has not seen a urologist for BPH.  Assessment BPH with LUTS  Plan Continue tamsulosin but I discussed with him I think would be a good idea to refer to urology for an evaluation given his symptoms are not completely controlled and he may be having some side effects from the high-dose tamsulosin.
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
## 2020-01-28 NOTE — Assessment & Plan Note (Signed)
HPI: Overall reports he is doing reasonably well with his blood sugar control he is using Omni pod along with Dexcom continuous glucose monitor.  I reviewed his trend overall he is having some nocturnal hyperglycemia.  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  Discussed limiting large dinner/carb intake.  We will also meet with Lupita Leash to further discuss.

## 2020-01-28 NOTE — Assessment & Plan Note (Signed)
HPI: He has a history of major depression for which he previously took Wellbutrin.  He has now been off of Wellbutrin for the past year.  His PHQ-9 is at a moderate level.  Assessment major depression disorder moderate  Plan He is not completely ready to try repeat medications he was concerned about sexual side effects of antidepressants.  I do think the pandemic and being out of work has contributed.  At this point I want to follow him after getting him back to work to see if he improves without medication.

## 2020-01-28 NOTE — Assessment & Plan Note (Signed)
HPI: Overall feels he is doing the best he has in the last few months.  Reports blood pressure has been well controlled.  He has been taking some L arginine which he attributes to helping his blood pressure.  He also remains adherent to his other medications including benazepril, hydrochlorothiazide.  He reports only taking carvedilol once a day.  He does report at least 2 episodes of what sounds like orthostatic hypotension when standing too quickly.  Of note he also takes 0.8 mg of tamsulosin daily for BPH with LUTS  Assessment essential hypertension well-controlled  Plan Hypertension control remains important. At this time I will continue him on benazepril 40 mg daily as well as hydrochlorothiazide 25 mg daily.  I discussed carvedilol is really meant to be a twice a day medication.  We could potentially change to once a day bisoprolol which would also have hypertension and cardiovascular benefits but he reports he would rather stay with carvedilol and go to twice a day (he previously has taken metoprolol succinate and did not like that medication.)

## 2020-01-28 NOTE — Assessment & Plan Note (Signed)
Stable continue Crestor 5 mg daily and aspirin 81 mg daily.

## 2020-02-04 ENCOUNTER — Telehealth: Payer: Self-pay | Admitting: *Deleted

## 2020-02-07 ENCOUNTER — Ambulatory Visit: Payer: BC Managed Care – PPO | Attending: Neurology | Admitting: Physical Therapy

## 2020-02-07 ENCOUNTER — Other Ambulatory Visit: Payer: Self-pay

## 2020-02-07 DIAGNOSIS — R262 Difficulty in walking, not elsewhere classified: Secondary | ICD-10-CM

## 2020-02-07 DIAGNOSIS — M25561 Pain in right knee: Secondary | ICD-10-CM | POA: Insufficient documentation

## 2020-02-07 DIAGNOSIS — M542 Cervicalgia: Secondary | ICD-10-CM | POA: Insufficient documentation

## 2020-02-07 DIAGNOSIS — M25562 Pain in left knee: Secondary | ICD-10-CM
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
## 2020-02-07 NOTE — Therapy (Signed)
[place], [place], [address] Phone: [phone]   Fax:  [phone]  Physical Therapy Treatment/Discharge  Patient Details  Name: DESHAY BLUMENFELD MRN: [id number] Date of Birth: 12-Aug-1956 Referring Provider (PT): Gilles Chiquito, MD   Encounter Date: 01/06/2020    Past Medical History:  Diagnosis Date  . Asthma   . CAD (coronary artery disease)    stent RCA 2006 (other residual disease). /  nuclear 2008  no ischemia, inferolateral scar.  . Cataract   . Chronic bronchitis ([place])    "anytime I get sick it goes into bronchitis; probably get it q yr"  . Daily headache    "just recently" (05/10/2014)  . Dyslipidemia   . Ejection fraction    EF 55%,echo, 2008, inferolateral hypokinesis,  . Erectile dysfunction   . Gastroparesis   . Hypertension   . Myocardial infarction ([facility]) 2006  . Severe obstructive sleep apnea-hypopnea syndrome 04/15/2009   Sleep study 03/15/2009 AHI of 82.2/hr oxygen desaturation nadir of 77%, loud snoring. Did not tolerate CPAP due to mask.  . Type 2 diabetes mellitus with complications ([place]) dx'd 0630    Past Surgical History:  Procedure Laterality Date  . CORONARY ANGIOPLASTY WITH STENT PLACEMENT  ~ 2006   "1"  . LAPAROSCOPIC APPENDECTOMY N/A 11/16/2014   Procedure: APPENDECTOMY LAPAROSCOPIC;  Surgeon: Greer Pickerel, MD;  Location: [facility];  Service: General;  Laterality: N/A;  . UMBILICAL HERNIA REPAIR N/A 11/16/2014   Procedure: OPEN PRIMARY UMBILICAL HERNIA REPAIR ;  Surgeon: Greer Pickerel, MD;  Location: [facility];  Service: General;  Laterality: N/A;    There were no vitals filed for this visit.                                   PT Long Term Goals - 01/06/20 1503      PT LONG TERM GOAL #1   Title Pt will be independent in his HEP and progresison.    Baseline met for previous HEP, will update prn    Time 6    Period Weeks    Status On-going      PT  LONG TERM GOAL #2   Title Pt will improve his cervical rotation to >/= 50 degrees bilaterally.    Baseline met-see objective    Time 6    Period Weeks    Status Achieved      PT LONG TERM GOAL #3   Title Pt will improve his BERG balance score to >/= 46 /56.    Baseline 54/56    Time 6    Period Weeks    Status Achieved      PT LONG TERM GOAL #4   Title Pt will be able to amb 1085fet community surfaces with pain </= 4/10 in bilateral knees.    Baseline 4/10 pain today but recently/intermittently higher    Time 6    Period Weeks    Status On-going                  Patient will benefit from skilled therapeutic intervention in order to improve the following deficits and impairments:  Pain, Postural dysfunction, Decreased strength, Decreased activity tolerance, Decreased range of motion, Dizziness, Decreased balance, Difficulty walking  Visit Diagnosis: Cervicalgia  Acute pain of right knee  Acute pain of left knee  Difficulty in walking, not elsewhere classified     Problem  List Patient Active Problem List   Diagnosis Date Noted  . Post concussive syndrome 08/02/2019  . Educated about COVID-19 virus infection 01/04/2019  . Flu-like symptoms 09/30/2018  . Benign localized prostatic hyperplasia with lower urinary tract symptoms (LUTS) 08/28/2018  . Tooth pain 05/23/2017  . Viral upper respiratory tract infection 05/21/2017  . Abdominal aortic atherosclerosis ([place]) 12/19/2016  . Vomiting 11/14/2016  . Tinea corporis 01/11/2016  . H/O umbilical hernia repair 45/80/9983  . Chronic back pain 10/07/2014  . Major depression, recurrent, chronic ([facility]) 10/07/2014  . Essential hypertension 05/10/2014  . Sprain of right ankle 07/10/2012  . Diabetic gastroparesis ([facility]) 05/05/2012  . Dental caries 05/05/2012  . Hyperlipidemia   . CAD (coronary artery disease), native coronary artery   . Bilateral carpal tunnel syndrome 06/09/2010  . ULNAR NEUROPATHY 06/09/2010  .  Obstructive sleep apnea hypopnea, severe 04/15/2009  . CERUMEN IMPACTION 02/24/2009  . ASTHMA, INTRINSIC, WITH ACUTE EXACERBATION 12/24/2006  . Allergic rhinitis 07/28/2006  . ERECTILE DYSFUNCTION, ORGANIC 07/28/2006  . 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 ([place]) 06/03/2006          PHYSICAL THERAPY DISCHARGE SUMMARY  Visits from Start of Care: 10 (patient did come to clinic 02/07/20 but no treatment was provided this date-visit limited to answering questions on clinic process for medical records release-last therapy treatment visit was 01/06/20).  Current functional level related to goals / functional outcomes: As of last visit 01/06/20 patient demonstrated improved balance and knee strength and cervical ROM with goals for these met-see assessment for treatment this date. Patient followed up with Dr. Heber [place] and was released to return to work. No further formal therapy planned at this time.   Remaining deficits: NA   Education / Equipment: Plan of care, patient was previously instructed in HEP Plan: Patient agrees to discharge.  Patient goals were met. Patient is being discharged due to meeting the stated rehab goals.  ?????           Beaulah Dinning, PT, DPT 02/07/20 6:07 PM     [facility] [address] [place], [place], [address] Phone: [phone]   Fax:  [phone]  Name: JASKARN SCHWEER MRN: [id number] Date of Birth: 1956/02/13
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
## 2020-02-08 ENCOUNTER — Other Ambulatory Visit: Payer: Self-pay | Admitting: Internal Medicine

## 2020-02-08 ENCOUNTER — Telehealth: Payer: Self-pay

## 2020-02-08 DIAGNOSIS — G4733 Obstructive sleep apnea (adult) (pediatric): Secondary | ICD-10-CM

## 2020-02-08 NOTE — Telephone Encounter (Signed)
Please call pt back regarding CPAP supply. Pt do not want to use [employer] for supply.

## 2020-02-08 NOTE — Telephone Encounter (Signed)
I returned phone call to patient reguarding his C-Pap machine. The patient said he had not used his machine for about 1 year. The patient got his machine from [facility].I asked the patient to call their retail store just to go over with him instructions to clean or how to use it. The patient said he did and they did not help him. The patient would like to use [employer] to get new supplies for his machine.I will send an in basket message to his PCP asking for Supplies only and will fax this to [facility] [place] C6/22/20213:54 PM

## 2020-02-14 ENCOUNTER — Other Ambulatory Visit: Payer: Self-pay | Admitting: *Deleted

## 2020-02-14 MED ORDER — ALPRAZOLAM 0.5 MG PO TABS: ORAL_TABLET | ORAL | 2 refills | Status: DC

## 2020-02-14 NOTE — Telephone Encounter (Signed)
Placed CPAP orders

## 2020-02-14 NOTE — Addendum Note (Signed)
Addended by: Gust Rung on: 02/14/2020 03:35 PM   Modules accepted: Orders

## 2020-02-23 DIAGNOSIS — Z961 Presence of intraocular lens: Secondary | ICD-10-CM | POA: Diagnosis not present

## 2020-02-23 DIAGNOSIS — H02423 Myogenic ptosis of bilateral eyelids: Secondary | ICD-10-CM | POA: Diagnosis not present

## 2020-02-23 DIAGNOSIS — H40053 Ocular hypertension, bilateral: Secondary | ICD-10-CM | POA: Diagnosis not present

## 2020-02-23 DIAGNOSIS — E119 Type 2 diabetes mellitus without complications: Secondary | ICD-10-CM | POA: Diagnosis not present

## 2020-02-23 LAB — HM DIABETES EYE EXAM

## 2020-02-24 ENCOUNTER — Ambulatory Visit (INDEPENDENT_AMBULATORY_CARE_PROVIDER_SITE_OTHER): Payer: BC Managed Care – PPO | Admitting: Dietician

## 2020-02-24 ENCOUNTER — Ambulatory Visit: Payer: BC Managed Care – PPO | Admitting: Dietician

## 2020-02-24 DIAGNOSIS — Z713 Dietary counseling and surveillance: Secondary | ICD-10-CM

## 2020-02-24 NOTE — Patient Instructions (Signed)
Try to decrease your nighttime highs.   Always carry candy or glucose tablets with you  Follow up in 1 month  Lupita Leash [phone]

## 2020-02-24 NOTE — Progress Notes (Signed)
  Medical Nutrition Therapy:  Appt start time: 1345 end time:  1415. Total time: 30 Visit # 2   Assessment:  Primary concerns today: glycemic control  This is a follow up visit for glycemic control. He uses an Omnipod insulin pump to adminster his insulin and a Dexcom Continuous glucose monitor. His pump setting and history were reviewed today. His Dexcom 14 day Continuous glucose monitoring download was reviewed. His blood glucose  time in range  is almost at goal for this visit.  He is not ready to use the extended bolus part of the calculator. He would like his basal rate increased instead. Feels his hypoglycemia was caused by over taking boluses.   His pump settings today are: Basal is 1.5 u/hr ICR 7.5 Correction 28 14 day total daily dose average 66.9u/day 49% basal/51%bolus   today's cgm dowbnload  downlod last visit  CGM Results from download:  active 88.9% of time  active 88.1% of time  Average glucose:  177   207 mg/dL for 14 days  Glucose management indicator:  ---   NA %  Time in range (70-180 mg/dL):  29%   79.8 %   (Goal >70%)  Time High (181-250 mg/dL):  92%   63 %   (Goal < 25%)  Time Very High (>250 mg/dL):   11%   94.1 %   (Goal < 5%)  Time Low (54-69 mg/dL):  <7%   0.2 %   (Goal <4%)  Time Very Low (<54 mg/dL):  <4%   0 %   (Goal <0%)  Coefficient of variation: 31.6%.   29.8 %   (Goal <36%)   Lab Results  Component Value Date   HGBA1C 8.0 (A) 12/22/2019   HGBA1C 7.7 (A) 09/15/2019   HGBA1C 7.6 (A) 05/21/2019   HGBA1C 8.2 (H) 12/30/2018   HGBA1C 8.5 (A) 08/28/2018     DIETARY INTAKE: Usual eating pattern includes 1-2 PM, 11 PM, 2 AM meals. Rinks 3-4 cans of soda per day, says he is no longer reading labels for carbs but has some foods memorized  Usual physical activity: is now back to working 5 days a week  Progress Towards Goal(s):  Some progress.   Nutritional Diagnosis:  NB-1.1 Food and nutrition-related knowledge deficit As related to lack of sufficient  pump and dexcom training is improving  As evidenced by his report and asking for help with changing his pump settings.    Intervention:  Nutrition education/ review of dexcom download; what an extended bolus is an dhow to use it and how to change basal rates in his pump Action Goal: Change today is to increase the basal insulin to 1.6u/hr   Outcome goal: improved blood sugars and confidence in diabetes self management Coordination of care: discuss with Dr. Mikey Bussing  Teaching Method Utilized: Visual, Auditory,Hands on Handouts given during visit include:none Barriers to learning/adherence to lifestyle change: competing values and attention Demonstrated degree of understanding via:  Teach Back   Monitoring/Evaluation:  Dietary intake, exercise, pump and dexcom, and body weight in 4 week(s)  Norm Parcel, RD 02/24/2020 2:38 PM. .

## 2020-02-28 ENCOUNTER — Encounter: Payer: Self-pay | Admitting: *Deleted

## 2020-03-02 DIAGNOSIS — H6121 Impacted cerumen, right ear: Secondary | ICD-10-CM | POA: Diagnosis not present

## 2020-03-02 DIAGNOSIS — H903 Sensorineural hearing loss, bilateral: Secondary | ICD-10-CM | POA: Diagnosis not present

## 2020-03-17 ENCOUNTER — Other Ambulatory Visit: Payer: Self-pay | Admitting: *Deleted

## 2020-03-17 MED ORDER — HYDROCHLOROTHIAZIDE 25 MG PO TABS: 25.0000 mg | ORAL_TABLET | Freq: Every day | ORAL | 3 refills | Status: DC

## 2020-04-07 DIAGNOSIS — N401 Enlarged prostate with lower urinary tract symptoms: Secondary | ICD-10-CM | POA: Diagnosis not present

## 2020-04-07 DIAGNOSIS — R3911 Hesitancy of micturition: Secondary | ICD-10-CM | POA: Diagnosis not present

## 2020-04-07 DIAGNOSIS — R3912 Poor urinary stream: Secondary | ICD-10-CM | POA: Diagnosis not present

## 2020-04-11 DIAGNOSIS — H02423 Myogenic ptosis of bilateral eyelids: Secondary | ICD-10-CM | POA: Diagnosis not present

## 2020-04-17 ENCOUNTER — Telehealth: Payer: Self-pay | Admitting: Dietician

## 2020-04-19 NOTE — Telephone Encounter (Signed)
Left voicemail for return call  
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
## 2020-04-21 ENCOUNTER — Encounter: Payer: Self-pay | Admitting: Neurology

## 2020-04-21 ENCOUNTER — Other Ambulatory Visit: Payer: Self-pay

## 2020-04-21 ENCOUNTER — Ambulatory Visit (INDEPENDENT_AMBULATORY_CARE_PROVIDER_SITE_OTHER): Payer: BC Managed Care – PPO | Admitting: Neurology

## 2020-04-21 VITALS — BP 120/68 | HR 79 | Ht 68.0 in | Wt 227.0 lb

## 2020-04-21 DIAGNOSIS — G44309 Post-traumatic headache, unspecified, not intractable: Secondary | ICD-10-CM

## 2020-04-21 DIAGNOSIS — F0781 Postconcussional syndrome: Secondary | ICD-10-CM

## 2020-04-21 NOTE — Telephone Encounter (Signed)
Left voicemail for return call  

## 2020-04-21 NOTE — Patient Instructions (Signed)
Follow up as needed

## 2020-05-01 NOTE — Telephone Encounter (Signed)
Discussed problems with his Dexcom, feels he would do better with Freestyle Libre 2. Asked when his next appointment with Dr. Mikey Bussing was. He agrees to schedule one.

## 2020-05-01 NOTE — Telephone Encounter (Signed)
Left voicemail for return call  

## 2020-05-03 ENCOUNTER — Telehealth: Payer: Self-pay | Admitting: Dietician

## 2020-05-05 NOTE — Telephone Encounter (Signed)
Called about the price for the Omnipod going up. Will send him a Good Rx coupon for his Omnipod and his pharmacy a Novolog coupon.

## 2020-05-18 ENCOUNTER — Other Ambulatory Visit: Payer: Self-pay

## 2020-05-18 DIAGNOSIS — E1143 Type 2 diabetes mellitus with diabetic autonomic (poly)neuropathy: Secondary | ICD-10-CM

## 2020-05-18 MED ORDER — DEXCOM G6 SENSOR MISC: 1.0000 | Freq: Every day | 12 refills | Status: DC

## 2020-06-19 ENCOUNTER — Telehealth: Payer: Self-pay | Admitting: Dietician

## 2020-06-19 NOTE — Telephone Encounter (Signed)
Discussed how he is doing with his pump and CGM.  Discussed flu and covid booster.

## 2020-06-24 IMAGING — DX DG PORTABLE PELVIS
1 series · 1 of 1 positions shown · non-contrast
Comparison: None.

CLINICAL DATA: Pain

EXAM:
PORTABLE PELVIS 1-2 VIEWS

[pelvis ap]
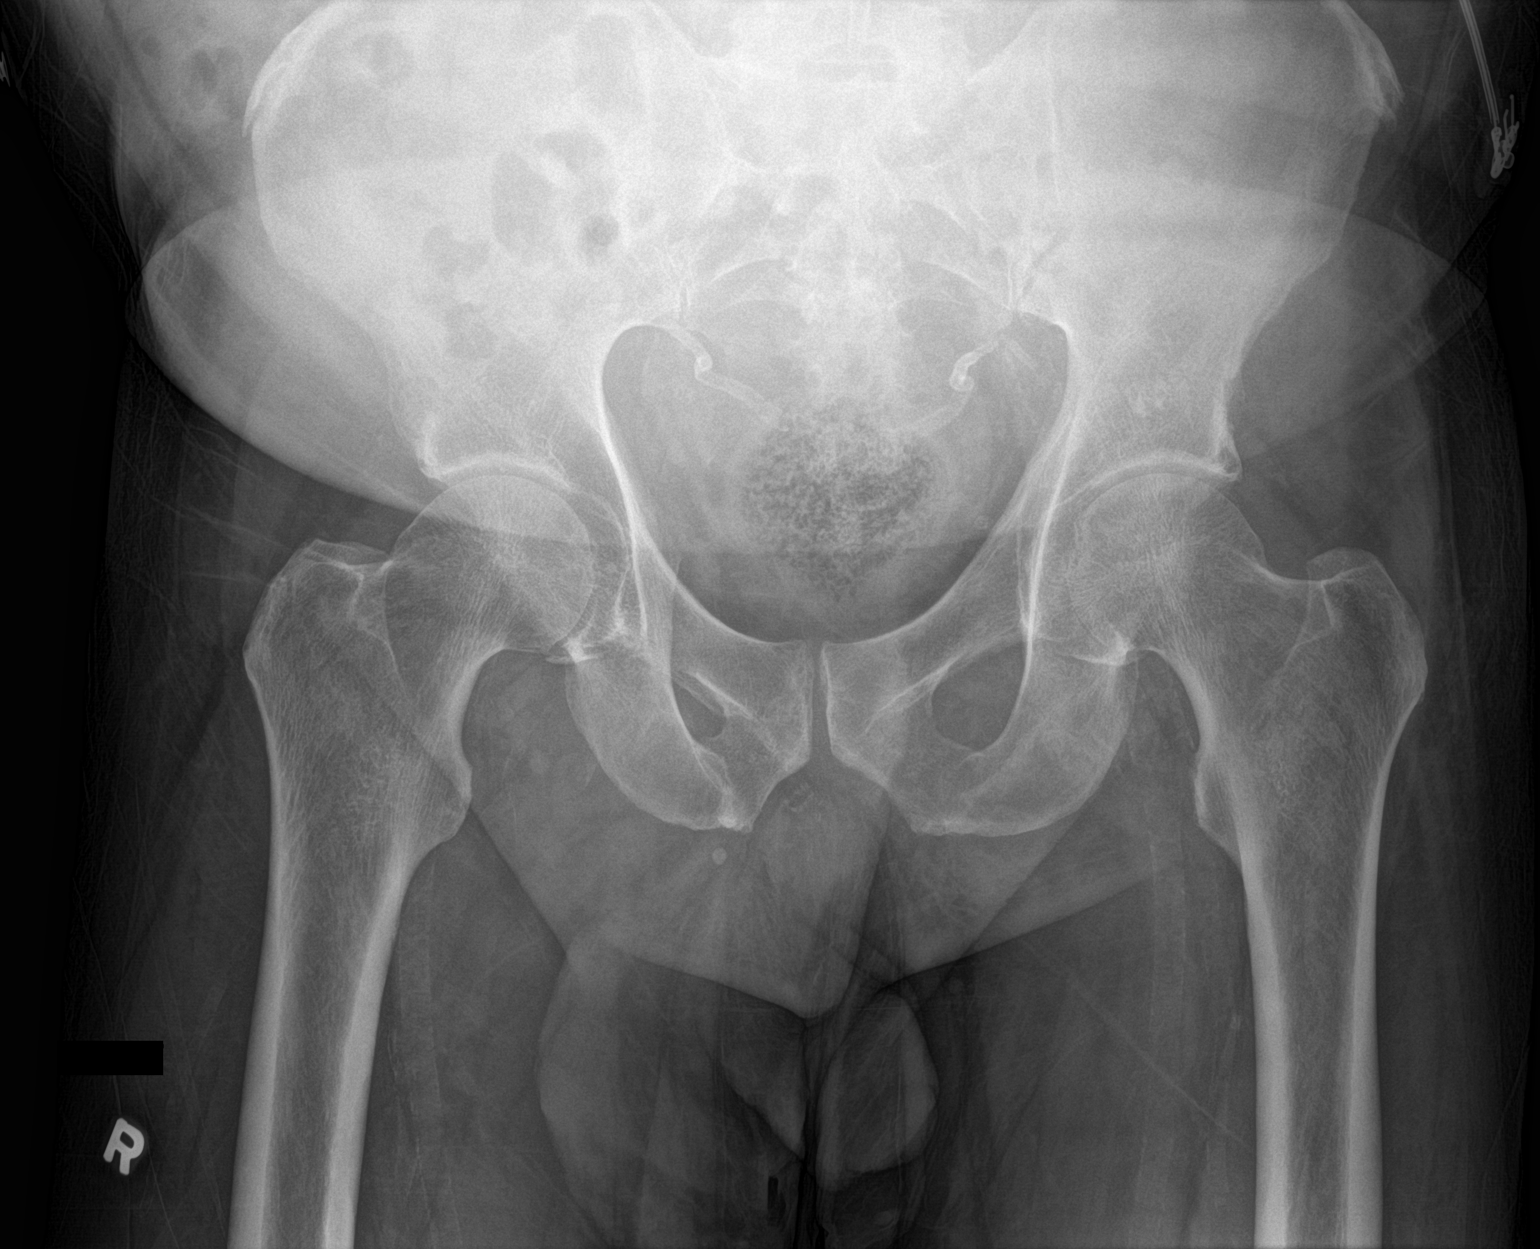

[1 of 1 positions shown; findings below may reference images not displayed]

FINDINGS: There is no evidence of pelvic fracture or diastasis. No pelvic bone
lesions are seen.
IMPRESSION: Negative.

## 2020-06-29 ENCOUNTER — Other Ambulatory Visit: Payer: Self-pay

## 2020-06-29 ENCOUNTER — Encounter: Payer: Self-pay | Admitting: Internal Medicine

## 2020-06-29 ENCOUNTER — Ambulatory Visit (INDEPENDENT_AMBULATORY_CARE_PROVIDER_SITE_OTHER): Payer: BC Managed Care – PPO | Admitting: Internal Medicine

## 2020-06-29 VITALS — BP 133/67 | HR 78 | Temp 97.8°F | Ht 72.0 in | Wt 232.7 lb

## 2020-06-29 DIAGNOSIS — E1143 Type 2 diabetes mellitus with diabetic autonomic (poly)neuropathy: Secondary | ICD-10-CM | POA: Diagnosis not present

## 2020-06-29 DIAGNOSIS — N401 Enlarged prostate with lower urinary tract symptoms: Secondary | ICD-10-CM | POA: Diagnosis not present

## 2020-06-29 DIAGNOSIS — G4733 Obstructive sleep apnea (adult) (pediatric): Secondary | ICD-10-CM

## 2020-06-29 DIAGNOSIS — Z23 Encounter for immunization: Secondary | ICD-10-CM

## 2020-06-29 DIAGNOSIS — Z7189 Other specified counseling: Secondary | ICD-10-CM

## 2020-06-29 DIAGNOSIS — I10 Essential (primary) hypertension: Secondary | ICD-10-CM | POA: Diagnosis not present

## 2020-06-29 LAB — POCT GLYCOSYLATED HEMOGLOBIN (HGB A1C): Hemoglobin A1C: 7.7 % — AB (ref 4.0–5.6)

## 2020-06-29 LAB — GLUCOSE, CAPILLARY: Glucose-Capillary: 141 mg/dL — ABNORMAL HIGH (ref 70–99)

## 2020-06-29 NOTE — Progress Notes (Signed)
  Subjective:  HPI: [name] a 64 y.o. male who presents for f/u DM, HTN  Please see Assessment and Plan below for the status of his chronic medical problems.  Objective:  Physical Exam: Vitals:   06/29/20 1029  BP: 133/67  Pulse: 78  Temp: 97.8 F (36.6 C)  TempSrc: Oral  SpO2: 96%  Weight: 232 lb 11.2 oz (105.6 kg)  Height: 6' (1.829 m)   Body mass index is 31.56 kg/m. Physical Exam Vitals and nursing note reviewed.  Constitutional:      Appearance: Normal appearance. He is obese.  Cardiovascular:     Rate and Rhythm: Normal rate and regular rhythm.  Pulmonary:     Effort: Pulmonary effort is normal.     Breath sounds: Normal breath sounds.  Neurological:     Mental Status: He is alert.  Psychiatric:        Mood and Affect: Mood normal.        Behavior: Behavior normal.    Assessment & Plan:  See Encounters Tab for problem based charting.  Medications Ordered No orders of the defined types were placed in this encounter.  Other Orders Orders Placed This Encounter  Procedures  . Flu Vaccine QUAD 36+ mos IM  . Glucose, capillary  . CMP14 + Anion Gap  . CBC with Diff  . Lipid Profile  . POC Hbg A1C   Follow Up: Return in about 3 months (around 09/29/2020).

## 2020-06-29 NOTE — Assessment & Plan Note (Addendum)
HPI: Saw urology in August 2020, LUTS symptoms improved at time. Recommended to continue 0.4mg  of tamsulosin, follow up as needed with urology.  He apparently reincreased to 0.8 mg daily.  Assessment BPH with LUTS  Plan I discussed with him Dr. Michel Bickers note and recommendations for continuing 0.4 mg of tamsulosin and ongoing screening for prostate cancer with PSA and DRE on annual basis.  He will try to go back down to 0.4 mg and let me know if this does not continue to resolve his symptoms.

## 2020-06-29 NOTE — Assessment & Plan Note (Signed)
HPI: Overall thinks he is doing okay with his sugars.  Denies any recent low sugars.  Taking Trulicity 1.5 mg weekly.  On Omni pod does not know his basal rate.  Download of CGM shows average glucose of 183 standard deviation of 5811% very high readings 36% high readings 51% in range 1% low and less than 1% very low.  Overall review shows nocturnal hyperglycemia.  Assessment controlled type 2 diabetes mellitus with peripheral neuropathy  Plan Discussed that his A1c is below 8% and that is a reasonably good goal for him.  I would like to improve the nocturnal hyper glycemia by slight increase of his basal rate.  I will have Lupita Leash follow-up with him about this.

## 2020-06-29 NOTE — Assessment & Plan Note (Signed)
HPI: Denies any orthostasis symptoms tolerating his blood pressure medications well.  Assessment essential hypertension at goal  Plan continue carvedilol 6.25 twice daily Continue benazepril 40 mg daily Continue hydrochlorothiazide 25 mg daily

## 2020-06-29 NOTE — Assessment & Plan Note (Signed)
Recommended booster plans to get in the next 2 weeks.

## 2020-06-29 NOTE — Assessment & Plan Note (Signed)
He reports having some trouble getting his obstructive sleep apnea supplies.  Wants to change DME providers to [facility].  I asked our office staff to follow-up with this request.

## 2020-06-30 ENCOUNTER — Telehealth: Payer: Self-pay | Admitting: *Deleted

## 2020-06-30 ENCOUNTER — Other Ambulatory Visit: Payer: Self-pay | Admitting: Student in an Organized Health Care Education/Training Program

## 2020-06-30 LAB — CBC WITH DIFFERENTIAL/PLATELET
Basophils Absolute: 0 10*3/uL (ref 0.0–0.2)
Basos: 1 %
EOS (ABSOLUTE): 0.6 10*3/uL — ABNORMAL HIGH (ref 0.0–0.4)
Eos: 7 %
Hematocrit: 41.3 % (ref 37.5–51.0)
Hemoglobin: 14.4 g/dL (ref 13.0–17.7)
Immature Grans (Abs): 0 10*3/uL (ref 0.0–0.1)
Immature Granulocytes: 0 %
Lymphocytes Absolute: 1.9 10*3/uL (ref 0.7–3.1)
Lymphs: 24 %
MCH: 29.3 pg (ref 26.6–33.0)
MCHC: 34.9 g/dL (ref 31.5–35.7)
MCV: 84 fL (ref 79–97)
Monocytes Absolute: 0.7 10*3/uL (ref 0.1–0.9)
Monocytes: 8 %
Neutrophils Absolute: 4.7 10*3/uL (ref 1.4–7.0)
Neutrophils: 60 %
Platelets: 196 10*3/uL (ref 150–450)
RBC: 4.91 x10E6/uL (ref 4.14–5.80)
RDW: 12.8 % (ref 11.6–15.4)
WBC: 7.8 10*3/uL (ref 3.4–10.8)

## 2020-06-30 LAB — CMP14 + ANION GAP
ALT: 38 IU/L (ref 0–44)
AST: 28 IU/L (ref 0–40)
Albumin/Globulin Ratio: 2 (ref 1.2–2.2)
Albumin: 4.3 g/dL (ref 3.8–4.8)
Alkaline Phosphatase: 106 IU/L (ref 44–121)
Anion Gap: 15 mmol/L (ref 10.0–18.0)
BUN/Creatinine Ratio: 16 (ref 10–24)
BUN: 16 mg/dL (ref 8–27)
Bilirubin Total: 0.4 mg/dL (ref 0.0–1.2)
CO2: 23 mmol/L (ref 20–29)
Calcium: 9.7 mg/dL (ref 8.6–10.2)
Chloride: 105 mmol/L (ref 96–106)
Creatinine, Ser: 1.01 mg/dL (ref 0.76–1.27)
GFR calc Af Amer: 90 mL/min/{1.73_m2} (ref >59)
GFR calc non Af Amer: 78 mL/min/{1.73_m2} (ref >59)
Globulin, Total: 2.2 g/dL (ref 1.5–4.5)
Glucose: 135 mg/dL — ABNORMAL HIGH (ref 65–99)
Potassium: 4.1 mmol/L (ref 3.5–5.2)
Sodium: 143 mmol/L (ref 134–144)
Total Protein: 6.5 g/dL (ref 6.0–8.5)

## 2020-06-30 LAB — LIPID PANEL
Chol/HDL Ratio: 3.1 ratio (ref 0.0–5.0)
Cholesterol, Total: 118 mg/dL (ref 100–199)
HDL: 38 mg/dL — ABNORMAL LOW (ref >39)
LDL Chol Calc (NIH): 66 mg/dL (ref 0–99)
Triglycerides: 66 mg/dL (ref 0–149)
VLDL Cholesterol Cal: 14 mg/dL (ref 5–40)

## 2020-06-30 NOTE — Telephone Encounter (Signed)
Call made to [facility] at pt's request to obtain CPAP supplies.  Pt did not obtain CPAP machine from [facility] and they are requesting the following info: Copy of sleep study, office notes prior to sleep study, most recent notes regarding cpap supplies, order for cpap supplies and recent cpap titration-since pt had not been using cpap and MD will be unable to document current use and benefit.  Once all this is addressed, they will be able to assist pt with supplies-pending insurance approval..Tyler Cozine Cassady11/12/202112:00 PM

## 2020-07-19 ENCOUNTER — Other Ambulatory Visit: Payer: Self-pay

## 2020-07-19 DIAGNOSIS — N401 Enlarged prostate with lower urinary tract symptoms: Secondary | ICD-10-CM

## 2020-07-21 MED ORDER — INSULIN ASPART 100 UNIT/ML ~~LOC~~ SOLN: SUBCUTANEOUS | 11 refills | Status: DC

## 2020-07-21 MED ORDER — TAMSULOSIN HCL 0.4 MG PO CAPS: 0.8000 mg | ORAL_CAPSULE | Freq: Every day | ORAL | 1 refills | Status: DC

## 2020-07-21 MED ORDER — TRULICITY 1.5 MG/0.5ML ~~LOC~~ SOAJ: 1.5000 mg | SUBCUTANEOUS | 11 refills | Status: DC

## 2020-07-25 ENCOUNTER — Other Ambulatory Visit: Payer: Self-pay | Admitting: *Deleted

## 2020-07-25 MED ORDER — ALPRAZOLAM 0.5 MG PO TABS
ORAL_TABLET | ORAL | 2 refills | Status: DC
Start: 2020-07-25 — End: 2021-11-22

## 2020-07-26 DIAGNOSIS — H02423 Myogenic ptosis of bilateral eyelids: Secondary | ICD-10-CM | POA: Diagnosis not present

## 2020-07-28 ENCOUNTER — Telehealth: Payer: Self-pay | Admitting: Dietician

## 2020-07-28 NOTE — Telephone Encounter (Signed)
Insulin was 60$ for 2 bottles.coupon faxed to [facility] on [address] successfully.

## 2020-08-09 ENCOUNTER — Other Ambulatory Visit: Payer: Self-pay

## 2020-08-09 DIAGNOSIS — E1143 Type 2 diabetes mellitus with diabetic autonomic (poly)neuropathy: Secondary | ICD-10-CM

## 2020-08-09 NOTE — Telephone Encounter (Signed)
Received following faxed message from [facility] regarding RX:  Insulin aspart (Novolog) 100 unit/ml injection *use to fill omnipod insulin pump  "We need a day supply and maximum dose". Will forward to PCP. Thanks, SChaplin, RN,BSN

## 2020-08-22 MED ORDER — DEXCOM G6 TRANSMITTER MISC
1.0000 | Freq: Every day | 3 refills | Status: DC
Start: 2020-08-22 — End: 2021-08-27

## 2020-08-22 MED ORDER — INSULIN ASPART 100 UNIT/ML ~~LOC~~ SOLN: SUBCUTANEOUS | 11 refills | Status: DC

## 2020-08-22 NOTE — Telephone Encounter (Signed)
Patient requests Dexcom transmitter refill.

## 2020-08-25 NOTE — Telephone Encounter (Signed)
Novolog savings card information: BIN:[id number] DPT:[id number] PCN:[id number] [id number]

## 2020-09-12 ENCOUNTER — Telehealth: Payer: Self-pay | Admitting: Dietician

## 2020-09-15 NOTE — Telephone Encounter (Addendum)
Discussed insulin/diabetes supplies costs per patient request. Support provided.

## 2020-09-22 DIAGNOSIS — Z9889 Other specified postprocedural states: Secondary | ICD-10-CM | POA: Diagnosis not present

## 2020-09-22 DIAGNOSIS — E119 Type 2 diabetes mellitus without complications: Secondary | ICD-10-CM | POA: Diagnosis not present

## 2020-09-22 DIAGNOSIS — Z961 Presence of intraocular lens: Secondary | ICD-10-CM | POA: Diagnosis not present

## 2020-09-22 DIAGNOSIS — H40053 Ocular hypertension, bilateral: Secondary | ICD-10-CM | POA: Diagnosis not present

## 2020-09-22 DIAGNOSIS — H5203 Hypermetropia, bilateral: Secondary | ICD-10-CM | POA: Diagnosis not present

## 2020-10-06 ENCOUNTER — Telehealth: Payer: Self-pay | Admitting: Dietician

## 2020-10-06 NOTE — Telephone Encounter (Signed)
Insulin pump support provided

## 2020-10-09 ENCOUNTER — Telehealth: Payer: Self-pay | Admitting: Dietician

## 2020-10-09 NOTE — Telephone Encounter (Signed)
Pt says he is doing better with nighttime highs. Says he decreases his night-time doses because he is afraid of "bottoming out' . He also says that if he lays in bed too long his sugar will get too low. This indicates a basal issue. I also suggested a change to his target blood sugars overnight. Treats lows with soda and Reeses. Appointment scheduled.

## 2020-10-20 ENCOUNTER — Encounter: Payer: Self-pay | Admitting: Dietician

## 2020-10-20 ENCOUNTER — Ambulatory Visit (INDEPENDENT_AMBULATORY_CARE_PROVIDER_SITE_OTHER): Payer: BC Managed Care – PPO | Admitting: Dietician

## 2020-10-20 ENCOUNTER — Other Ambulatory Visit: Payer: Self-pay | Admitting: Internal Medicine

## 2020-10-20 DIAGNOSIS — Z713 Dietary counseling and surveillance: Secondary | ICD-10-CM

## 2020-10-20 DIAGNOSIS — E1143 Type 2 diabetes mellitus with diabetic autonomic (poly)neuropathy: Secondary | ICD-10-CM

## 2020-10-20 LAB — POCT GLYCOSYLATED HEMOGLOBIN (HGB A1C): Hemoglobin A1C: 8.4 % — AB (ref 4.0–5.6)

## 2020-10-20 LAB — GLUCOSE, CAPILLARY: Glucose-Capillary: 146 mg/dL — ABNORMAL HIGH (ref 70–99)

## 2020-10-20 NOTE — Progress Notes (Signed)
Medical Nutrition Therapy:  Appt start time: 1140 end time:  1210.( minus time for getting and A1C) Total time: 20 Visit # 3  Assessment:  Primary concerns today: glycemic control  This is a follow up visit for glycemic control. He uses an Omnipod insulin pump to adminster his insulin and a Dexcom Continuous glucose monitor. His pump setting and history were reviewed today. His Dexcom 30 day Continuous glucose monitoring download was reviewed.  He is not ready to use extended bolus. He continues to enter false blood sugars and grossly estimates carbs to force the pump to give him an a desired amount of insulin.  He would like his basal rate increased. However, he is already at 56% of total daily insulin coming from basal insulin. He is over treating his hypoglycemia. Mr. Misiaszek mentioned that he sui very much afraid fo going to sleep with a blood sugar os 120 mg/dl   His pump settings today are: Basal is 1.6 u/hr 38. ICR 7.5 Correction 28 90 day total daily dose average  64.4 u/day 56% basal/ 44%bolus   Download from 12/21  Download from today  CGM Results from download:  active 88.9% of time  active 95.3% of time  Average glucose:  177   189 mg/dL for 30 days  Glucose management indicator:  ---   7.8 %  Time in range (70-180 mg/dL):  09%   49 %   (Goal >38%)  Time High (181-250 mg/dL):  18%   50 %   (Goal < 25%)  Time Very High (>250 mg/dL):   29%   17  %   (Goal < 5%)  Time Low (54-69 mg/dL):  <9%   0.7 %   (Goal <4%)  Time Very Low (<54 mg/dL):  <3%   0.1 %   (Goal <1%)  Coefficient of variation: 31.6%.   34.6 %   (Goal <36%)   Lab Results  Component Value Date   HGBA1C 8.4 (A) 10/20/2020   HGBA1C 7.7 (A) 06/29/2020   HGBA1C 8.0 (A) 12/22/2019   HGBA1C 7.7 (A) 09/15/2019   HGBA1C 7.6 (A) 05/21/2019     DIETARY INTAKE: Usual eating pattern includes 1-2 PM, 11 PM, 2 AM meals. Trying to cut download soda Very small- 40 sweet tarts ( ~ 40 grams carb)  Small meal-  nabs or  butterfingers or can of soda (~ 40-50 grams carb)  Medium meal- hamburger or sandwich or nabs or butterfinger with soda (~79 grams carb) Large meal - 3/4-1 cup pintos, slaw, cheeseburger, can of soda ( ~ 100 grams carb)    Usual physical activity: is now back to working 5 days a week  Progress Towards Goal(s):  Some progress.   Nutritional Diagnosis:  NB-1.1 Food and nutrition-related knowledge deficit As related to lack of sufficient pump and dexcom training is improving  As evidenced by his report and asking for help with changing his pump settings.    Intervention:  Nutrition education/ review of dexcom download; review of pump settings, discussed set amounts of carbs and using actual blood sugars , educated on how fat affects blood sugar and accounted for this in her bolus recommendations Action Goal: Change today is to increase the basal insulin to 1.6u/hr   Outcome goal: improved blood sugars and confidence in diabetes self management Coordination of care: discuss with Dr. Mikey Bussing- suggest changing his ICR to 7 form 7.5  Teaching Method Utilized: Visual, Auditory,Hands on Handouts given during visit include:none Barriers to learning/adherence to  lifestyle change: competing values and attention Demonstrated degree of understanding via:  Teach Back   Monitoring/Evaluation:  Dietary intake, exercise, pump and dexcom, and body weight in 4 week(s)  Norm Parcel, RD 10/20/2020 1:24 PM. .

## 2020-10-20 NOTE — Addendum Note (Signed)
Addended by: Bufford Spikes on: 10/20/2020 12:05 PM   Modules accepted: Orders

## 2020-10-20 NOTE — Patient Instructions (Signed)
Tyler Wilkinson -  Here are the amounts of carb to put in for yiour meals base don how big they are:  Please use your actual blood sugar to calculate:   Very small        40 grams of carb Small meal       70 grams of carbs  Medium meal    95 grams carb  Large Meal      120  grams carbs    Lab Results  Component Value Date   HGBA1C 8.4 (A) 10/20/2020   HGBA1C 7.7 (A) 06/29/2020   HGBA1C 8.0 (A) 12/22/2019   HGBA1C 7.7 (A) 09/15/2019   HGBA1C 7.6 (A) 05/21/2019

## 2020-10-24 ENCOUNTER — Telehealth: Payer: Self-pay | Admitting: Dietician

## 2020-10-24 NOTE — Telephone Encounter (Signed)
Call to [facility] for PA for Omnipod Dash Refill Pods 10 pods for 30 days. [place] number is:8 am - 5 pm [phone] option 3 and option 3 again.

## 2020-10-25 ENCOUNTER — Telehealth: Payer: Self-pay | Admitting: *Deleted

## 2020-10-25 NOTE — Telephone Encounter (Addendum)
Information was sent through CoverMyMeds for PA for OmniPod Dash refill Pods.  . Awaiting determination  Angelina Ok, RN 10/25/2020 10:30 AM. Call to [facility] PA for Omnipods was approved 10/25/2020 thru 10/25/2021 Angelina Ok, RN 10/26/2020 4:27 PM.

## 2020-11-03 ENCOUNTER — Other Ambulatory Visit: Payer: Self-pay | Admitting: Pharmacist

## 2020-11-03 ENCOUNTER — Telehealth: Payer: Self-pay | Admitting: Dietician

## 2020-11-03 NOTE — Progress Notes (Signed)
Created in error

## 2020-11-03 NOTE — Telephone Encounter (Addendum)
Discussed adjusting pump settings to help him better use his pump. Suggested he bring in his pump to look at his settings. Asks for more insulin because he is running out before he can get it refilled. He only got 2 bottles last time because of the cost. Will send a coupon.

## 2020-11-03 NOTE — Telephone Encounter (Signed)
Spoke with pharmacy who were unable to apply coupon. Will request Dr.Kelly's assistance.

## 2020-11-06 ENCOUNTER — Telehealth: Payer: Self-pay | Admitting: Pharmacist

## 2020-11-06 NOTE — Telephone Encounter (Signed)
Thank you very much 

## 2020-11-06 NOTE — Telephone Encounter (Signed)
Spoke with patient on phone regarding Novolog cost. I provided coupon to pharmacy which brought price down by $10. Discussed with patient and he was ok with this price and mentioned it was feasible. He was appreciative for $10 savings. Discussed that he should be receiving 3 vials, which he was very happy to hear. Provided my direct number to patient in the event he has any further issues with Novolog prescription or if pharmacy does not give him three vials.

## 2020-11-16 ENCOUNTER — Ambulatory Visit (INDEPENDENT_AMBULATORY_CARE_PROVIDER_SITE_OTHER): Payer: BC Managed Care – PPO | Admitting: Dietician

## 2020-11-16 ENCOUNTER — Ambulatory Visit (INDEPENDENT_AMBULATORY_CARE_PROVIDER_SITE_OTHER): Payer: BC Managed Care – PPO | Admitting: Internal Medicine

## 2020-11-16 ENCOUNTER — Other Ambulatory Visit: Payer: Self-pay

## 2020-11-16 ENCOUNTER — Encounter: Payer: Self-pay | Admitting: Dietician

## 2020-11-16 ENCOUNTER — Encounter: Payer: Self-pay | Admitting: Internal Medicine

## 2020-11-16 VITALS — BP 124/55 | HR 82 | Temp 98.2°F | Ht 72.0 in | Wt 233.8 lb

## 2020-11-16 DIAGNOSIS — H6121 Impacted cerumen, right ear: Secondary | ICD-10-CM

## 2020-11-16 DIAGNOSIS — K3184 Gastroparesis: Secondary | ICD-10-CM

## 2020-11-16 DIAGNOSIS — G4733 Obstructive sleep apnea (adult) (pediatric): Secondary | ICD-10-CM

## 2020-11-16 DIAGNOSIS — I10 Essential (primary) hypertension: Secondary | ICD-10-CM

## 2020-11-16 DIAGNOSIS — Z713 Dietary counseling and surveillance: Secondary | ICD-10-CM | POA: Diagnosis not present

## 2020-11-16 DIAGNOSIS — E1143 Type 2 diabetes mellitus with diabetic autonomic (poly)neuropathy: Secondary | ICD-10-CM | POA: Diagnosis not present

## 2020-11-16 MED ORDER — SILDENAFIL CITRATE 100 MG PO TABS: 100.0000 mg | ORAL_TABLET | ORAL | 3 refills | Status: DC | PRN

## 2020-11-16 NOTE — Progress Notes (Signed)
  Subjective:  HPI: [name] a 65 y.o. male who presents for f/u DM, HTN  Please see Assessment and Plan below for the status of his chronic medical problems.  Objective:  Physical Exam: Vitals:   11/16/20 1037  BP: (!) 124/55  Pulse: 82  Temp: 98.2 F (36.8 C)  TempSrc: Oral  SpO2: 99%  Weight: 233 lb 12.8 oz (106.1 kg)  Height: 6' (1.829 m)   Body mass index is 31.71 kg/m. Physical Exam Vitals and nursing note reviewed.  Constitutional:      Appearance: Normal appearance.  HENT:     Right Ear: There is impacted cerumen.     Left Ear: Tympanic membrane and ear canal normal.  Pulmonary:     Effort: Pulmonary effort is normal.  Abdominal:     Comments: omnipod and dexcom present  Musculoskeletal:     Right lower leg: No edema.     Left lower leg: No edema.  Neurological:     Mental Status: He is alert.  Psychiatric:        Mood and Affect: Mood normal.    Assessment & Plan:  See Encounters Tab for problem based charting.  Medications Ordered Meds ordered this encounter  Medications  . sildenafil (VIAGRA) 100 MG tablet    Sig: Take 1 tablet (100 mg total) by mouth as needed for erectile dysfunction.    Dispense:  20 tablet    Refill:  3   Other Orders No orders of the defined types were placed in this encounter.  Follow Up: Return in about 3 months (around 02/15/2021).  Ear lavage preformed by Lela, resulted in cerumen removal along with a plastic tip cover type piece, upon showing to patient he feels this was the tip of an instrument he tried to use last week. Tyler Wilkinson wore the CGM for 14 days. The average reading was 172, % time in target was 57% time below target was 1%, and 41% time above target. Intervention will be to adjust bolus.

## 2020-11-16 NOTE — Progress Notes (Signed)
Medical Nutrition Therapy:  Appt start time: 1150 end time:  1210 Total time: 20 Visit # 4  Assessment:  Primary concerns today: glycemic control He states he would like his A1C to be "7%". This is a follow up visit for Tyler Wilkinson. He uses an Omnipod insulin pump to adminster his insulin and a Dexcom Continuous glucose monitor. His pump setting and history were reviewed today. His Dexcom 30 day Continuous glucose monitoring download was reviewed. He continues to enter false blood sugars and estimates carbs to force the pump to give him an a desired amount of insulin.    His pump settings today are: Basal is 1.6 u/hr 38. ICR 7 Correction 28 ; target 12 am -11 am 120 with correct above 150, target 11 am to 12 am is 110 with correct above 140   Download from today  Download from 2/4 to 10/20/20  CGM Results from download:  active 97% of time  active 95.3% of time  Average glucose:  174 for 30 days   189 mg/dL for 30 days  Glucose management indicator:  7.5%   7.8 %  %Time in range (70-180 mg/dL):  90%   49 %   (Goal >24%)  Time High (181-250 mg/dL):  09%   50 %   (Goal < 25%)  Time Very High (>250 mg/dL):   73%   17  %   (Goal < 5%)  Time Low (54-69 mg/dL):    1%   0.7 %   (Goal <4%)  Time Very Low (<54 mg/dL):  <5%   0.1 %   (Goal <1%)  Coefficient of variation: 31%.   34.6 %   (Goal <36%)   Lab Results  Component Value Date   HGBA1C 8.4 (A) 10/20/2020   HGBA1C 7.7 (A) 06/29/2020   HGBA1C 8.0 (A) 12/22/2019   HGBA1C 7.7 (A) 09/15/2019   HGBA1C 7.6 (A) 05/21/2019     DIETARY INTAKE: Usual eating pattern includes 1-2 PM, 11 PM, 2 AM meals. Small- 40 sweet tarts or a soda ( ~ 40 grams carb)  Medium meal- hamburger or sandwich or nabs or butter finger/Reesies cups x2 (55 grams carb) with soda (~79 grams carb) Large meal - 3/4-1 cup pintos, slaw, cheeseburger, can of soda ( ~ 100-110 grams carb)    Usual physical activity: is now back to working 5 days a week  Progress Towards  Goal(s):  Some progress.   Nutritional Diagnosis:  NB-1.1 Food and nutrition-related knowledge deficit As related to lack of sufficient pump and Dexcom training is improving  As evidenced by his report and asking for help with changing his pump settings.    Intervention:  Nutrition education/ review of dexcom download;  Using trend arrows to make treatment decisions, review of pump settings, discussed set amounts of carbs and using set amounts of meal insulin and entering correct blood sugar for correction Action Goal: Change: use correction insulin properly  Outcome goal: improved blood sugars and confidence in diabetes self management Coordination of care: discuss pump settings with Dr. Mikey Bussing  Teaching Method Utilized: Visual, Auditory,Hands on Handouts given during visit include:none Barriers to learning/adherence to lifestyle change: competing values and attention Demonstrated degree of understanding via:  Teach Back   Monitoring/Evaluation:  Dietary intake, exercise, pump and dexcom, and body weight in 4 week(s)  Tyler Wilkinson, RD 11/16/2020 2:14 PM. .

## 2020-11-20 NOTE — Assessment & Plan Note (Signed)
HPI: Has not been consistently using his CPAP machine lately has been having some mild headaches.  Assessment severe obstructive sleep apnea  Plan Reinforced adherence to CPAP.

## 2020-11-20 NOTE — Assessment & Plan Note (Signed)
HPI: We downloaded his CGM and he actually has some improved readings over the last 2 weeks.  CGM note documented in main progress note.  I discussed with Lupita Leash overall her issue is that he does not carb count and occasionally places an accurate correction factor in place of carb counting.  We have attempted to simplify this so that he may be able to give himself fixed dose of insulin per meal with his Omni pod.  Assessment type 2 diabetes with peripheral neuropathy  Plan Continue use of OmniPod please see Donna's note for adjustment details

## 2020-11-20 NOTE — Assessment & Plan Note (Signed)
HPI: He reports he has had some mild nausea symptoms with eating large meals.  Assessment diabetic gastroparesis, use of GLP-1 receptor agonist  Plan I reminded him that his use of Trulicity may also slow gastric emptying.  This will be worsened with large meals he does feel the Trulicity is helping and wants to remain on it and he will avoid consuming larger meals.

## 2020-11-20 NOTE — Assessment & Plan Note (Signed)
HPI: Overall feels he is doing well he has had no issues with his blood pressure medications.  Assessment essential hypertension at goal  Plan continue carvedilol 6.25 twice daily Continue benazepril 40 mg daily Continue hydrochlorothiazide 25 mg daily

## 2020-11-27 ENCOUNTER — Telehealth: Payer: Self-pay | Admitting: Dietician

## 2020-11-27 NOTE — Telephone Encounter (Signed)
Per patient request please call Tyler Wilkinson about his Covid booster when you gt in the office. Wonders if taking too many too close together may harm his immune system.

## 2020-12-26 ENCOUNTER — Telehealth: Payer: Self-pay | Admitting: Dietician

## 2020-12-26 NOTE — Telephone Encounter (Addendum)
Feels his blood sugars are more stable using his pump correctly which he says he has been doing ~ 90% of the time. Blood sugar is 129 this am fasting. He also says his neuropathy is bothering him more/ he does not eat dark leafy greens or other sources of B12. It was last checked 2010. Recommend rechecking B12. Per his request will also ask front office to coordinate visits with me and r Hoffman around June 30th.

## 2020-12-27 NOTE — Telephone Encounter (Signed)
Yes thanks for the pickup, we do need to check a B12!

## 2021-01-16 ENCOUNTER — Telehealth: Payer: Self-pay | Admitting: Dietician

## 2021-01-16 NOTE — Telephone Encounter (Signed)
Left voicemail for return call  

## 2021-01-22 NOTE — Telephone Encounter (Signed)
Mr. Leider called to ask about his upcoming appointments and his difficulty with his Dexcom sensors

## 2021-02-01 ENCOUNTER — Other Ambulatory Visit: Payer: Self-pay | Admitting: Dietician

## 2021-02-01 DIAGNOSIS — I10 Essential (primary) hypertension: Secondary | ICD-10-CM

## 2021-02-01 NOTE — Telephone Encounter (Signed)
Patient called requesting a call back. Needs benazapril (Lotensin), says pharmacy was supposed to request it.

## 2021-02-05 MED ORDER — BENAZEPRIL HCL 40 MG PO TABS: 40.0000 mg | ORAL_TABLET | Freq: Every day | ORAL | 3 refills | Status: DC

## 2021-02-07 ENCOUNTER — Telehealth: Payer: Self-pay | Admitting: Dietician

## 2021-02-07 NOTE — Telephone Encounter (Signed)
Right lung bothering/hurting him since Monday, wants to know if Dr. Mikey Bussing can call him if he is Covid positive. Plans to do an at home Covid test today. Sneezing more. Talking without problem, has been working, no cough, no temperature. He will call us with the results.

## 2021-02-15 ENCOUNTER — Other Ambulatory Visit: Payer: Self-pay | Admitting: Dietician

## 2021-02-15 ENCOUNTER — Encounter: Payer: BC Managed Care – PPO | Admitting: Dietician

## 2021-02-15 ENCOUNTER — Encounter: Payer: BC Managed Care – PPO | Admitting: Internal Medicine

## 2021-02-15 ENCOUNTER — Ambulatory Visit (INDEPENDENT_AMBULATORY_CARE_PROVIDER_SITE_OTHER): Payer: BC Managed Care – PPO | Admitting: Dietician

## 2021-02-15 ENCOUNTER — Encounter: Payer: Self-pay | Admitting: Dietician

## 2021-02-15 VITALS — Wt 227.1 lb

## 2021-02-15 DIAGNOSIS — E1143 Type 2 diabetes mellitus with diabetic autonomic (poly)neuropathy: Secondary | ICD-10-CM

## 2021-02-15 DIAGNOSIS — J069 Acute upper respiratory infection, unspecified: Secondary | ICD-10-CM

## 2021-02-15 DIAGNOSIS — Z713 Dietary counseling and surveillance: Secondary | ICD-10-CM | POA: Diagnosis not present

## 2021-02-15 LAB — POCT GLYCOSYLATED HEMOGLOBIN (HGB A1C): Hemoglobin A1C: 7.2 % — AB (ref 4.0–5.6)

## 2021-02-15 LAB — GLUCOSE, CAPILLARY: Glucose-Capillary: 157 mg/dL — ABNORMAL HIGH (ref 70–99)

## 2021-02-15 NOTE — Telephone Encounter (Signed)
Mr. Follette asked for assistance with cost of insulin. He was given the following  Savings card for Novolog:  CHY:[id number] PCN:[id number] GRP: [id number] [id number]  He also requested refills on strips and inhaler.

## 2021-02-15 NOTE — Progress Notes (Signed)
Medical Nutrition Therapy:  Appt start time: 1050 end time:  1150 ( 30 minutes spent on foot exam) Total time: 30 Visit # 5  Assessment:  Primary concerns today: glycemic control He states he would like his A1C to be "7%". This is a follow up visit for Tyler Wilkinson. He uses an Omnipod insulin pump to adminster his insulin and also takes Novolog by injection to supplement the pump about once a day. He uses a Dexcom Continuous glucose monitor. His pump setting and history were reviewed today. His Dexcom 30 day Continuous glucose monitoring download was reviewed. He is using his pump mostly as directed now. "I don't have to cheat anymore" .  He is content with his A1C of 7.2% today. I encouraged him to use only his pump for insulin.  His pump settings today are: Basal is 1.6 u/hr 38. ICR 7 Correction 27 ; target 12 am -11 am 120 with correct above 150, target 11 am to 12 am is 110 with correct above 140 Estimated body mass index is 30.8 kg/m as calculated from the following:   Height as of 11/16/20: 6' (1.829 m).   Weight as of this encounter: 227 lb 1.6 oz (103 kg).  Wt Readings from Last 10 Encounters:  02/15/21 227 lb 1.6 oz (103 kg)  11/16/20 233 lb 12.8 oz (106.1 kg)  06/29/20 232 lb 11.2 oz (105.6 kg)  04/21/20 227 lb (103 kg)  01/27/20 229 lb 1.6 oz (103.9 kg)  12/22/19 228 lb 1.6 oz (103.5 kg)  12/15/19 220 lb (99.8 kg)  12/08/19 227 lb (103 kg)  10/26/19 227 lb (103 kg)  09/28/19 230 lb 9.6 oz (104.6 kg)      Download from 11/16/20  Download from today  CGM Results from download:  active 97% of time  active  70% of time  Average glucose:  174 for 30 days   175 mg/dL for 30 days  Glucose management indicator:  7.5%   7.5 %  %Time in range (70-180 mg/dL):  64%   58 %   (Goal >40%)  Time High (181-250 mg/dL):  34%   74%   (Goal < 25%)  Time Very High (>250 mg/dL):   25%   10  %   (Goal < 5%)  Time Low (54-69 mg/dL):    1%   1 %   (Goal <9%)  Time Very Low (<54 mg/dL):  <5%   <1 %    (Goal <1%)  Coefficient of variation: 31%.   34.6 %   (Goal <36%)   Lab Results  Component Value Date   HGBA1C 7.2 (A) 02/15/2021   HGBA1C 8.4 (A) 10/20/2020   HGBA1C 7.7 (A) 06/29/2020   HGBA1C 8.0 (A) 12/22/2019   HGBA1C 7.7 (A) 09/15/2019     DIETARY INTAKE: Usual eating pattern includes 1-2 PM, 11 PM, 2 AM meals. Small- 40 sweet tarts or a soda ( ~ 40 grams carb)  Large meal - 3/4-1 cup pintos, slaw, cheeseburger, can of soda ( ~ 100-110 grams carb)  Medium meal- hamburger or sandwich or nabs or butter finger/Reesies cups x2 (55 grams carb) with soda (~79 grams carb)   Usual physical activity: is now back to working 5 days a week  Progress Towards Goal(s):  Some progress.   Nutritional Diagnosis:  NB-1.1 Food and nutrition-related knowledge deficit As related to lack of sufficient pump and Dexcom training is improving  As evidenced by his report and asking for help with matching his  pump settings to his food intake and insulin needs.    Intervention:  Nutrition education/ review of dexcom download;  Using trend arrows to make treatment decisions, review of pump settings, discussed increased correction dose to address postprandial highs  Action Goal: Change: use correction insulin properly  Outcome goal: improved blood sugars and confidence in diabetes self management Coordination of care: discuss pump care with Dr. Mikey Bussing; consider increased dose of Trulicity to assist with decreased insulin needs and assist with weight gain.  Teaching Method Utilized: Visual, Auditory,Hands on Handouts given during visit include:none Barriers to learning/adherence to lifestyle change: competing values and attention Demonstrated degree of understanding via:  Teach Back   Monitoring/Evaluation:  Dietary intake, exercise, pump and dexcom, and body weight in 4 week(s)  Tyler Wilkinson, RD 02/15/2021 2:47 PM. .

## 2021-02-20 ENCOUNTER — Other Ambulatory Visit: Payer: Self-pay | Admitting: Dietician

## 2021-02-20 ENCOUNTER — Encounter: Payer: Self-pay | Admitting: *Deleted

## 2021-02-20 DIAGNOSIS — E1143 Type 2 diabetes mellitus with diabetic autonomic (poly)neuropathy: Secondary | ICD-10-CM

## 2021-02-20 MED ORDER — OMNIPOD DASH PODS (GEN 4) MISC: 1.0000 | 11 refills | Status: DC

## 2021-02-20 MED ORDER — ALBUTEROL SULFATE HFA 108 (90 BASE) MCG/ACT IN AERS: 2.0000 | INHALATION_SPRAY | Freq: Four times a day (QID) | RESPIRATORY_TRACT | 1 refills | Status: AC | PRN

## 2021-02-20 MED ORDER — BAYER CONTOUR NEXT TEST VI STRP: ORAL_STRIP | 5 refills | Status: AC

## 2021-02-20 NOTE — Telephone Encounter (Signed)
Mr. Brunty says [facility] has been faxing [place] for refill for his Omnipod Dash pods. He is almost out.

## 2021-02-27 ENCOUNTER — Other Ambulatory Visit: Payer: Self-pay | Admitting: Dietician

## 2021-02-27 DIAGNOSIS — J302 Other seasonal allergic rhinitis: Secondary | ICD-10-CM

## 2021-02-27 NOTE — Telephone Encounter (Signed)
Asked what a1c result was; needs allergy medicine refilled. Says blood sugars are doing well.

## 2021-03-22 ENCOUNTER — Encounter: Payer: BC Managed Care – PPO | Admitting: Dietician

## 2021-03-22 ENCOUNTER — Encounter: Payer: BC Managed Care – PPO | Admitting: Internal Medicine

## 2021-03-27 ENCOUNTER — Telehealth: Payer: Self-pay | Admitting: Dietician

## 2021-03-27 NOTE — Telephone Encounter (Signed)
Would like to see Dr. Marchia Bond, (he has a sore thumb) preferably on Friday. He was not aware that his appointment with Dr. Mikey Bussing had been changed. He cancelled his appointment with me appropriately.  He has an eye appointment with Dr. Dione Booze in November.

## 2021-03-28 DIAGNOSIS — E119 Type 2 diabetes mellitus without complications: Secondary | ICD-10-CM | POA: Diagnosis not present

## 2021-03-28 DIAGNOSIS — H40053 Ocular hypertension, bilateral: Secondary | ICD-10-CM | POA: Diagnosis not present

## 2021-03-28 DIAGNOSIS — Z9889 Other specified postprocedural states: Secondary | ICD-10-CM | POA: Diagnosis not present

## 2021-03-28 DIAGNOSIS — H04123 Dry eye syndrome of bilateral lacrimal glands: Secondary | ICD-10-CM | POA: Diagnosis not present

## 2021-03-28 LAB — HM DIABETES EYE EXAM

## 2021-03-28 NOTE — Telephone Encounter (Signed)
Called pt back and informed Dr Criselda Peaches stated he probably needs to come in. Stated he cannot come today - agree to be seen tomorrow. Appt schedule with Dr Marchia Bond  tomorrow 8/11 @ 1345 PM.

## 2021-03-28 NOTE — Telephone Encounter (Signed)
Called pt - stated his thumb is swollen and red but not throbbing. Stated he has been soaking it in w/salt water; "looks better" Going on x 1 week. He does not want to schedule an appt today; stated he will call back if it worsens.

## 2021-03-28 NOTE — Telephone Encounter (Signed)
Will ask triage nurse to follow up with patient as he felt his thumb was infected and was throbbing.

## 2021-03-29 ENCOUNTER — Encounter: Payer: BC Managed Care – PPO | Admitting: Internal Medicine

## 2021-03-29 ENCOUNTER — Encounter: Payer: Self-pay | Admitting: Internal Medicine

## 2021-03-29 ENCOUNTER — Ambulatory Visit (INDEPENDENT_AMBULATORY_CARE_PROVIDER_SITE_OTHER): Payer: BC Managed Care – PPO | Admitting: Internal Medicine

## 2021-03-29 ENCOUNTER — Encounter: Payer: BC Managed Care – PPO | Admitting: Dietician

## 2021-03-29 VITALS — BP 134/84 | HR 78 | Temp 98.0°F | Wt 227.8 lb

## 2021-03-29 DIAGNOSIS — Z8619 Personal history of other infectious and parasitic diseases: Secondary | ICD-10-CM | POA: Diagnosis not present

## 2021-03-29 DIAGNOSIS — B351 Tinea unguium: Secondary | ICD-10-CM

## 2021-03-29 DIAGNOSIS — L03011 Cellulitis of right finger: Secondary | ICD-10-CM | POA: Diagnosis not present

## 2021-03-29 DIAGNOSIS — F339 Major depressive disorder, recurrent, unspecified: Secondary | ICD-10-CM | POA: Diagnosis not present

## 2021-03-29 MED ORDER — BUPROPION HCL ER (XL) 150 MG PO TB24
150.0000 mg | ORAL_TABLET | Freq: Every day | ORAL | 3 refills | Status: DC
Start: 2021-03-29 — End: 2021-09-04

## 2021-03-29 NOTE — Patient Instructions (Addendum)
Mr.Dearis Stephannie Li, it was a pleasure seeing you today!  Today we discussed: Infection of thumb- Today your thumb looks like it is healing.  Try using some neosporin and a bandaid.  In the future, try your best to not mess with your cuticles.   Thick toenails- I'll go ahead and refer you to podiatry.  They will help with your feet, but they do not take care of hands.  Followup in one month for regular visit for bloodwork  I have ordered the following labs today:  Lab Orders  No laboratory test(s) ordered today     Tests ordered today: none    Referrals ordered today:   Referral Orders  No referral(s) requested today     I have ordered the following medication/changed the following medications:   Stop the following medications: There are no discontinued medications.   Start the following medications: No orders of the defined types were placed in this encounter.    Follow-up:  1 month    Please make sure to arrive 15 minutes prior to your next appointment. If you arrive late, you may be asked to reschedule.   We look forward to seeing you next time. Please call our clinic at [phone] if you have any questions or concerns. The best time to call is Monday-Friday from 9am-4pm, but there is someone available 24/7. If after hours or the weekend, call the main hospital number and ask for the Internal Medicine Resident On-Call. If you need medication refills, please notify your pharmacy one week in advance and they will send [place] a request.  Thank you for letting us take part in your care. Wishing you the best!  Thank you, Dr. [facility]

## 2021-03-29 NOTE — Assessment & Plan Note (Signed)
Patient requests referral to podiatry.  He declines foot exam at this time.  States that his toenails are thickened and difficult to cut. Plan: -Placed referral to podiatry

## 2021-03-29 NOTE — Assessment & Plan Note (Addendum)
Patient presents with one week history of swelling, tenderness, and redness of right thumb.  He states that he pulls his cuticles off as a habit.  The skin around his finger became sore, then began to swell with a pus collection under the skin.  He began soaking finger for 30 minutes in salt water.  Today, he states that his finger is still tender, but that the pus went away a few days ago.  He has not tried any antibiotic ointment or used a bandaid for this.   Assessment: The perionychium of right thumb is not erythematous and no pus is visualized.  There is a 1cmx0.5cm ecchymosis present.  Patient educated to try not to pull off his cuticles to prevent this from happening in the future.   Plan: -told patient to use antibiotic ointment and cover with bandaid until thumb becomes less tender.

## 2021-03-29 NOTE — Assessment & Plan Note (Addendum)
PHQ9 SCORE ONLY 03/29/2021 11/16/2020 06/29/2020  PHQ-9 Total Score 16 8 8    Presents with marked increase in PHQ-9 score today.  Tyler Wilkinson has a history of major depression and was previously taking wellbutrin.  Denies SI/ HI at this time.  States that Tyler Wilkinson is not interested in counseling.  Tyler Wilkinson has tried this previously.  Tyler Wilkinson has recently had multiple friends to pass away.  Tyler Wilkinson will call the clinic if his symptoms get worse.  Plan: -will restart Wellbutrin 150 mg XL qd -F/u in one month, can titrate dose to BID if needed

## 2021-03-29 NOTE — Progress Notes (Signed)
   CC: sore thumb  HPI:  [name] a 65 y.o. with medical history as below presenting to [facility] for possible infection of right thumb following week of soreness, pus, and redness.   Please see problem-based list for further details, assessments, and plans.  Past Medical History:  Diagnosis Date   Asthma    CAD (coronary artery disease)    stent RCA 2006 (other residual disease). /  nuclear 2008  no ischemia, inferolateral scar.   Cataract    Chronic bronchitis (HCC)    "anytime I get sick it goes into bronchitis; probably get it q yr"   Daily headache    "just recently" (05/10/2014)   Dyslipidemia    Ejection fraction    EF 55%,echo, 2008, inferolateral hypokinesis,   Erectile dysfunction    Gastroparesis    Hypertension    Myocardial infarction (HCC) 2006   Severe obstructive sleep apnea-hypopnea syndrome 04/15/2009   Sleep study 03/15/2009 AHI of 82.2/hr oxygen desaturation nadir of 77%, loud snoring. Did not tolerate CPAP due to mask.   Type 2 diabetes mellitus with complications (HCC) dx'd 1972    Review of Systems:   As noted in problem-based list  Physical Exam:  There were no vitals filed for this visit.  General:well-developed, well-nourished HENT: NCAT, no rashes Eyes: no scleral icterus, conjunctiva clear CV: No murmurs, normal rate Pulm: CTAB, normal pulmonary effort GI: no tenderness present, bowel sounds normal MSK: no restriction of movement at right thumb Skin: warm and dry, perionychium of right thumb had no pus, erythema, warmth, but was tender. Ecchymosis present at perionychium.   Psych: normal mood and affect  Assessment & Plan:   See Encounters Tab for problem based charting.  Patient seen with Dr. Antony Contras

## 2021-04-03 NOTE — Progress Notes (Signed)
Internal Medicine Clinic Attending  I saw and evaluated the patient.  I personally confirmed the key portions of the history and exam documented by Dr. Masters and I reviewed pertinent patient test results.  The assessment, diagnosis, and plan were formulated together and I agree with the documentation in the resident's note.  

## 2021-04-06 ENCOUNTER — Ambulatory Visit: Payer: BC Managed Care – PPO | Admitting: Podiatry

## 2021-04-09 ENCOUNTER — Telehealth: Payer: Self-pay | Admitting: *Deleted

## 2021-04-09 NOTE — Telephone Encounter (Signed)
Patient called in c/o severe h/a, dry cough that has caused him to vomit, lack of appetite, fever, sweats. Denies SHOB. Patient has received 3 vaccines for Covid. States sx began yesterday and home test yesterday was negative. Explained it may take a few days for test to be positive. Tele appt given for tomorrow AM. He is asked to take another home test tomorrow prior to Provider call. Advised plenty of fluids, especially water, plenty of rest, Ibuprofen, Aleve or tylenol as needed for pain. Instructed to isolate for 5 days from onset of symptoms and to wear mask when out for additional 5 days. He is aware to head directly to ED if develops CP, or SHOB at rest. He was very appreciative.

## 2021-04-10 ENCOUNTER — Ambulatory Visit (INDEPENDENT_AMBULATORY_CARE_PROVIDER_SITE_OTHER): Payer: BC Managed Care – PPO | Admitting: Student

## 2021-04-10 DIAGNOSIS — R059 Cough, unspecified: Secondary | ICD-10-CM | POA: Diagnosis not present

## 2021-04-10 DIAGNOSIS — U071 COVID-19: Secondary | ICD-10-CM | POA: Diagnosis not present

## 2021-04-10 LAB — BASIC METABOLIC PANEL
Anion gap: 9 (ref 5–15)
BUN: 15 mg/dL (ref 8–23)
CO2: 25 mmol/L (ref 22–32)
Calcium: 8.7 mg/dL — ABNORMAL LOW (ref 8.9–10.3)
Chloride: 100 mmol/L (ref 98–111)
Creatinine, Ser: 1.37 mg/dL — ABNORMAL HIGH (ref 0.61–1.24)
GFR, Estimated: 57 mL/min — ABNORMAL LOW (ref >60)
Glucose, Bld: 158 mg/dL — ABNORMAL HIGH (ref 70–99)
Potassium: 3.6 mmol/L (ref 3.5–5.1)
Sodium: 134 mmol/L — ABNORMAL LOW (ref 135–145)

## 2021-04-10 MED ORDER — PULSE OXIMETER FOR FINGER MISC: 0 refills | Status: AC

## 2021-04-10 MED ORDER — BENZONATATE 100 MG PO CAPS: 100.0000 mg | ORAL_CAPSULE | Freq: Four times a day (QID) | ORAL | 1 refills | Status: DC | PRN

## 2021-04-10 NOTE — Progress Notes (Signed)
  [facility] Internal Medicine Residency Telephone Encounter Continuity Care Appointment  HPI:  This telephone encounter was created for [name] on 04/11/2021 for the following purpose/cc cough, HA, and balance issues after testing positive for COVID.    Past Medical History:  Past Medical History:  Diagnosis Date   Asthma    CAD (coronary artery disease)    stent RCA 2006 (other residual disease). /  nuclear 2008  no ischemia, inferolateral scar.   Cataract    Chronic bronchitis (HCC)    "anytime I get sick it goes into bronchitis; probably get it q yr"   Daily headache    "just recently" (05/10/2014)   Dyslipidemia    Educated about COVID-19 virus infection 01/04/2019   Ejection fraction    EF 55%,echo, 2008, inferolateral hypokinesis,   Erectile dysfunction    Flu-like symptoms 09/30/2018   Gastroparesis    Hypertension    Myocardial infarction (HCC) 2006   Severe obstructive sleep apnea-hypopnea syndrome 04/15/2009   Sleep study 03/15/2009 AHI of 82.2/hr oxygen desaturation nadir of 77%, loud snoring. Did not tolerate CPAP due to mask.   Sprain of right ankle 07/10/2012   Tooth pain 05/23/2017   Type 2 diabetes mellitus with complications (HCC) dx'd 1972   Viral upper respiratory tract infection 05/21/2017   Vomiting 11/14/2016     ROS:     Assessment / Plan / Recommendations:  Please see A&P under problem oriented charting for assessment of the patient's acute and chronic medical conditions.  As always, pt is advised that if symptoms worsen or new symptoms arise, they should go to an urgent care facility or to to ER for further evaluation.   Consent and Medical Decision Making:  Patient discussed with Dr. Mayford Knife This is a telephone encounter between Tyler Wilkinson and Marolyn Haller on 04/11/2021 for COVID symptoms. The visit was conducted with the patient located at home and Marolyn Haller at [facility]. The patient's identity was confirmed using their DOB and  current address. The patient has consented to being evaluated through a telephone encounter and understands the associated risks (an examination cannot be done and the patient may need to come in for an appointment) / benefits (allows the patient to remain at home, decreasing exposure to coronavirus). I personally spent 20 minutes on medical discussion.

## 2021-04-11 ENCOUNTER — Other Ambulatory Visit: Payer: Self-pay | Admitting: Pharmacist

## 2021-04-11 DIAGNOSIS — U071 COVID-19: Secondary | ICD-10-CM | POA: Insufficient documentation

## 2021-04-11 DIAGNOSIS — J069 Acute upper respiratory infection, unspecified: Secondary | ICD-10-CM

## 2021-04-11 MED ORDER — PROMETHAZINE-CODEINE 6.25-10 MG/5ML PO SOLN: 5.0000 mL | Freq: Four times a day (QID) | ORAL | 0 refills | Status: DC | PRN

## 2021-04-11 MED ORDER — GUAIFENESIN-CODEINE 200-10 MG/5ML PO LIQD: 5.0000 mL | ORAL | 0 refills | Status: DC | PRN

## 2021-04-11 MED ORDER — PAXLOVID (150/100) 10 X 150 MG & 10 X 100MG PO TBPK: 1.0000 | ORAL_TABLET | Freq: Two times a day (BID) | ORAL | 0 refills | Status: AC

## 2021-04-11 NOTE — Assessment & Plan Note (Signed)
Patient's symptoms began 2 days prior to this clinic visit.  He notes a persistent dry cough, headache, decreased appetite, and feeling off balance with going from the seated to standing position.  Patient states that he is also has some subjective fevers but has not been able to take his temperature.  He has also had 1 episode of emesis after a coughing spell and 2 episodes of diarrhea.  He denies any shortness of breath.  Patient had a COVID 19 test the day prior to his clinic visit which was positive. Patient reports he has been vaccinated x2 and received 1x booster.  -Discussed with patient getting a pulse oximeter to monitor his oxygenation levels.  He was instructed to give [place] a call if his levels were below 94. -Paxlovid was sent in due to patient's multiple comorbidities and potential for progression of his symptoms, although this medication has found to be more beneficial in patients without COVID 19 vaccination.  -A prescription for tessalon perles and codeine cough syrup were also sent. Patient was instructed to present to the ED if he spiked a fever, his symptoms worsened, developed shortness of breath, or his oxygenation level was below 94% on multiple readings.

## 2021-04-17 NOTE — Progress Notes (Signed)
Internal Medicine Clinic Attending  Case discussed with Dr. Carter  At the time of the visit.  We reviewed the resident's history and exam and pertinent patient test results.  I agree with the assessment, diagnosis, and plan of care documented in the resident's note.  

## 2021-04-20 ENCOUNTER — Ambulatory Visit: Payer: BC Managed Care – PPO | Admitting: Podiatry

## 2021-04-20 DIAGNOSIS — Z20822 Contact with and (suspected) exposure to covid-19: Secondary | ICD-10-CM | POA: Diagnosis not present

## 2021-04-26 ENCOUNTER — Other Ambulatory Visit: Payer: Self-pay

## 2021-04-26 ENCOUNTER — Telehealth: Payer: BC Managed Care – PPO | Admitting: Internal Medicine

## 2021-04-26 ENCOUNTER — Telehealth: Payer: Self-pay

## 2021-04-26 NOTE — Telephone Encounter (Signed)
PT is requesting a call back ..he stated that he is still having covid symptoms and he is getting worried because its going on the trid week

## 2021-04-26 NOTE — Telephone Encounter (Signed)
Returned patient's call and left a message asking him to call the main office number and ask to speak to the triage nurse.

## 2021-04-26 NOTE — Telephone Encounter (Signed)
Returned call to patient. No answer. Left detailed message on patient's self-identified VM to call back and request telehealth appt for this afternoon.

## 2021-05-04 ENCOUNTER — Other Ambulatory Visit (HOSPITAL_COMMUNITY): Payer: Self-pay

## 2021-05-04 ENCOUNTER — Ambulatory Visit: Payer: BC Managed Care – PPO | Admitting: Podiatry

## 2021-05-08 ENCOUNTER — Telehealth: Payer: Self-pay | Admitting: Internal Medicine

## 2021-05-08 NOTE — Telephone Encounter (Signed)
Patient requesting a call back about about his previous Covid Symptoms.

## 2021-05-08 NOTE — Telephone Encounter (Signed)
Return pt's call. States he needs to talk to Dr Mikey Bussing about his lungs hurting since having covid.  And also about sleep apnea.He went back to work yesterday and he's on his way to work now but cannot inside on the phone but will have his phone on vibrate. Or call him tomorrow around 1100 AM.

## 2021-05-10 NOTE — Telephone Encounter (Signed)
Attempted call, left VM.

## 2021-05-11 ENCOUNTER — Other Ambulatory Visit: Payer: Self-pay

## 2021-05-11 MED ORDER — HYDROCHLOROTHIAZIDE 25 MG PO TABS: 25.0000 mg | ORAL_TABLET | Freq: Every day | ORAL | 3 refills | Status: DC

## 2021-05-11 NOTE — Telephone Encounter (Signed)
Spoke with Tyler Wilkinson, still having some SOB after his COVID infection last month.  Wondering if he has fluid in the lungs.  Overall he has improved, sounds like he may have had rebound covid after paxlovid.  I discussed he would need to be seen and get a chest xray.    Could we get him an appointment with one of the residents next week.  I will keep my appointment with him next month.

## 2021-05-14 ENCOUNTER — Encounter: Payer: Self-pay | Admitting: Dietician

## 2021-05-14 ENCOUNTER — Telehealth: Payer: Self-pay | Admitting: Dietician

## 2021-05-14 NOTE — Telephone Encounter (Signed)
[name] left several messages on my voicemail while I was out of the office about his lungs. Return call to patient: He feels better, not coughing, went back to work last week. Discussed his diabetes self care. Support provided.

## 2021-05-15 ENCOUNTER — Encounter: Payer: Self-pay | Admitting: *Deleted

## 2021-05-16 ENCOUNTER — Ambulatory Visit (HOSPITAL_COMMUNITY)
Admission: RE | Admit: 2021-05-16 | Discharge: 2021-05-16 | Disposition: A | Discharge status: Home / Self Care | Payer: BC Managed Care – PPO | Source: Ambulatory Visit | Attending: Internal Medicine | Admitting: Internal Medicine

## 2021-05-16 ENCOUNTER — Encounter: Payer: Self-pay | Admitting: Internal Medicine

## 2021-05-16 ENCOUNTER — Other Ambulatory Visit: Payer: Self-pay

## 2021-05-16 ENCOUNTER — Ambulatory Visit (INDEPENDENT_AMBULATORY_CARE_PROVIDER_SITE_OTHER): Payer: BC Managed Care – PPO | Admitting: Internal Medicine

## 2021-05-16 VITALS — BP 135/80 | HR 82 | Temp 97.9°F | Resp 28 | Ht 72.0 in | Wt 223.8 lb

## 2021-05-16 DIAGNOSIS — Z0000 Encounter for general adult medical examination without abnormal findings: Secondary | ICD-10-CM | POA: Diagnosis not present

## 2021-05-16 DIAGNOSIS — R0602 Shortness of breath: Secondary | ICD-10-CM | POA: Diagnosis not present

## 2021-05-16 DIAGNOSIS — I2510 Atherosclerotic heart disease of native coronary artery without angina pectoris: Secondary | ICD-10-CM | POA: Diagnosis not present

## 2021-05-16 DIAGNOSIS — R059 Cough, unspecified: Secondary | ICD-10-CM | POA: Diagnosis not present

## 2021-05-16 DIAGNOSIS — J45909 Unspecified asthma, uncomplicated: Secondary | ICD-10-CM | POA: Diagnosis not present

## 2021-05-16 DIAGNOSIS — R079 Chest pain, unspecified: Secondary | ICD-10-CM

## 2021-05-16 DIAGNOSIS — I10 Essential (primary) hypertension: Secondary | ICD-10-CM | POA: Diagnosis not present

## 2021-05-16 NOTE — Telephone Encounter (Signed)
Patient returned call. He describes one of the worst pains he has ever had occurring yesterday. His call was transferred to the front office to schedule an appointment for this week for shortness of breath per triage nurse/Dr. Mikey Bussing request

## 2021-05-16 NOTE — Progress Notes (Signed)
   CC: shortness of breath  HPI:  Tyler Wilkinson is a 65 y.o. male with PMHx as stated below presenting for evaluation of shortness of breath that has been present since having COVID. He reports one episode of chest pain radiating to back yesterday. He notes that it has since improved but is still persistent. Please see problem based charting for complete assessment and plan.   Past Medical History:  Diagnosis Date   Asthma    CAD (coronary artery disease)    stent RCA 2006 (other residual disease). /  nuclear 2008  no ischemia, inferolateral scar.   Cataract    Chronic bronchitis (HCC)    "anytime I get sick it goes into bronchitis; probably get it q yr"   Daily headache    "just recently" (05/10/2014)   Dyslipidemia    Educated about COVID-19 virus infection 01/04/2019   Ejection fraction    EF 55%,echo, 2008, inferolateral hypokinesis,   Erectile dysfunction    Flu-like symptoms 09/30/2018   Gastroparesis    Hypertension    Myocardial infarction (HCC) 2006   Severe obstructive sleep apnea-hypopnea syndrome 04/15/2009   Sleep study 03/15/2009 AHI of 82.2/hr oxygen desaturation nadir of 77%, loud snoring. Did not tolerate CPAP due to mask.   Sprain of right ankle 07/10/2012   Tooth pain 05/23/2017   Type 2 diabetes mellitus with complications (HCC) dx'd 1972   Viral upper respiratory tract infection 05/21/2017   Vomiting 11/14/2016   Review of Systems:  Negative except as stated in HPI.  Physical Exam:  Vitals:   05/16/21 1528  BP: 135/80  Pulse: 82  Resp: (!) 28  Temp: 97.9 F (36.6 C)  TempSrc: Oral  SpO2: 99%  Weight: 223 lb 12.8 oz (101.5 kg)  Height: 6' (1.829 m)   Physical Exam  Constitutional: Appears well-developed and well-nourished. No distress.  Cardiovascular: Normal rate, regular rhythm, S1 and S2 present, no murmurs, rubs, gallops.  Distal pulses intact Respiratory: No respiratory distress, Lungs are clear to auscultation bilaterally. Musculoskeletal:  Normal bulk and tone.  No peripheral edema noted. Skin: Warm and dry.  No rash, erythema, lesions noted. Psychiatric: Normal mood and affect.    Assessment & Plan:   See Encounters Tab for problem based charting.  Patient discussed with Dr. Mayford Knife

## 2021-05-16 NOTE — Patient Instructions (Signed)
Tyler Wilkinson,  It was a pleasure seeing you in clinic. Today we discussed:   Chest pain: Your EKG was normal at this visit. Please schedule a visit with your cardiologist   Shortness of breath: I have ordered a chest x-ray to evaluate this further. I will call you with any abnormal results.   If you have any questions or concerns, please call our clinic at [phone] between 9am-5pm and after hours call [phone] and ask for the internal medicine resident on call. If you feel you are having a medical emergency please call 911.   Thank you, we look forward to helping you remain healthy!

## 2021-05-16 NOTE — Telephone Encounter (Signed)
Call from pt requesting to be seen for sob; s/p covid. See Dr Neita Garnet telephone note 05/11/21. Appt given with Dr Mcarthur Rossetti today @ 1445 PM.

## 2021-05-20 DIAGNOSIS — R0602 Shortness of breath: Secondary | ICD-10-CM | POA: Insufficient documentation

## 2021-05-20 DIAGNOSIS — Z0000 Encounter for general adult medical examination without abnormal findings: Secondary | ICD-10-CM | POA: Insufficient documentation

## 2021-05-20 NOTE — Assessment & Plan Note (Addendum)
Patient presented for evaluation of shortness of breath following recent COVID infection. Patient tested positive on 8/22 after two days of severe headache, dry cough, and subjective fevers and received treatment with paxlovid. He reports improvement in symptoms; however, has persistent shortness of breath. He reports that this is on minimal exertion. He returned to work earlier this week and notes that he continues to have shortness of breath but also endorsed one episode of central chest pain radiating to his back. He reports this to be "the worst pain of his life" that improved with taking his "heart medications". He does note some ongoing chest pain at this time.  Vitals stable and CV and pulmonary exam without significant findings.  EKG obtained that was noted to be sinus rhythm without ST segment changes. CXR obtained that did not demonstrate any interstitial or extrapulmonary lung findings.  Unclear etiology of patient's persistent dyspnea; suspect could be prolonged COVID symptoms vs COPD given remote history of smoking. He does have a history of asthma. However, no recent PFT's noted.  If symptoms persist, will obtain formal PFTs and can also consider further cardiac work up with stress test.

## 2021-05-20 NOTE — Assessment & Plan Note (Signed)
Referral to GI for screening colonoscopy 

## 2021-05-28 ENCOUNTER — Other Ambulatory Visit: Payer: Self-pay

## 2021-05-28 DIAGNOSIS — N401 Enlarged prostate with lower urinary tract symptoms: Secondary | ICD-10-CM

## 2021-05-28 MED ORDER — TAMSULOSIN HCL 0.4 MG PO CAPS: 0.8000 mg | ORAL_CAPSULE | Freq: Every day | ORAL | 1 refills | Status: DC

## 2021-05-28 MED ORDER — ROSUVASTATIN CALCIUM 5 MG PO TABS: 5.0000 mg | ORAL_TABLET | Freq: Every day | ORAL | 3 refills | Status: DC

## 2021-06-04 NOTE — Progress Notes (Signed)
Internal Medicine Clinic Attending ° °Case discussed with Dr. Aslam  At the time of the visit.  We reviewed the resident’s history and exam and pertinent patient test results.  I agree with the assessment, diagnosis, and plan of care documented in the resident’s note.  °

## 2021-06-06 ENCOUNTER — Telehealth: Payer: Self-pay | Admitting: Dietician

## 2021-06-06 NOTE — Telephone Encounter (Signed)
Having problems with his Dexcom, woke up with a blood sugar of 300 mg/dl.confirmed it was 300 with a meter, took 5 units. Has not eaten or drank anything today. Meter 416,Dexcom HI (above 400). Advised to inject a correction dose  with syringe and change pod. Carry insulin and glucometer with him today. Call  us back if this does not solve his hyperglycemia.

## 2021-06-12 ENCOUNTER — Telehealth: Payer: Self-pay | Admitting: Dietician

## 2021-06-12 NOTE — Telephone Encounter (Signed)
Support call for diabetes.

## 2021-06-12 NOTE — Telephone Encounter (Signed)
Patient left message for return call

## 2021-06-14 ENCOUNTER — Encounter: Payer: BC Managed Care – PPO | Admitting: Internal Medicine

## 2021-06-14 ENCOUNTER — Telehealth: Payer: Self-pay | Admitting: Dietician

## 2021-06-14 NOTE — Telephone Encounter (Signed)
Apologizes for missing his appointment, he did not go to bed until 6 AM. He would like a later in the day video appointment if any way possible.

## 2021-06-27 ENCOUNTER — Other Ambulatory Visit: Payer: Self-pay | Admitting: Internal Medicine

## 2021-06-27 ENCOUNTER — Telehealth: Payer: Self-pay | Admitting: Dietician

## 2021-06-27 DIAGNOSIS — E1143 Type 2 diabetes mellitus with diabetic autonomic (poly)neuropathy: Secondary | ICD-10-CM

## 2021-06-28 NOTE — Telephone Encounter (Signed)
[name] called yesterday for a Dexcom sensor, refilled today, so he no longer needs one. He also wants to se a doctor soon, asks if okay to see Tyler Wilkinson who is in the office the mont of December if unable to see Tyler Wilkinson. Wants to discuss Wellbutrin dose being adjusted to 1 pill per day vs 2 pills per day. He is running out early. Will route this note to Tyler Wilkinson for his input.

## 2021-07-03 NOTE — Telephone Encounter (Signed)
Patient notified to schedule with Dr. Marchia Bond in December.

## 2021-07-03 NOTE — Telephone Encounter (Signed)
I am more than happy to see him in December. I am happy to change his dose of Wellbutrin, but I'd like to see him to make sure it is helping.

## 2021-07-19 ENCOUNTER — Telehealth: Payer: Self-pay | Admitting: Dietician

## 2021-07-19 NOTE — Telephone Encounter (Signed)
Advised patient that his Rolm [employer] PDM may be replaced. Per [place], he will be contacted via his email. He verbalized understanding. Encouraged him to make an appointment with a doctor.

## 2021-08-14 ENCOUNTER — Other Ambulatory Visit: Payer: Self-pay | Admitting: Dietician

## 2021-08-14 DIAGNOSIS — E1143 Type 2 diabetes mellitus with diabetic autonomic (poly)neuropathy: Secondary | ICD-10-CM

## 2021-08-14 MED ORDER — OMNIPOD DASH PODS (GEN 4) MISC: 11 refills | Status: DC

## 2021-08-14 NOTE — Telephone Encounter (Signed)
Tyler Wilkinson is out of omnipod pods band needs a refill. (He says [facility] told him Dr. Mikey Bussing discontinued them in July. ) Please sign refill as patient is still using Omnipod insulin pump top administer his insulin.

## 2021-08-23 ENCOUNTER — Other Ambulatory Visit: Payer: Self-pay | Admitting: Internal Medicine

## 2021-08-23 DIAGNOSIS — E1143 Type 2 diabetes mellitus with diabetic autonomic (poly)neuropathy: Secondary | ICD-10-CM

## 2021-08-24 MED ORDER — TRULICITY 1.5 MG/0.5ML ~~LOC~~ SOAJ: 1.5000 mg | SUBCUTANEOUS | 11 refills | Status: DC

## 2021-08-24 NOTE — Telephone Encounter (Signed)
Please schedule with me at next available time (overdue for A1c check)

## 2021-08-27 ENCOUNTER — Telehealth: Payer: Self-pay | Admitting: Dietician

## 2021-08-27 ENCOUNTER — Other Ambulatory Visit: Payer: Self-pay

## 2021-08-27 DIAGNOSIS — E1143 Type 2 diabetes mellitus with diabetic autonomic (poly)neuropathy: Secondary | ICD-10-CM

## 2021-08-27 MED ORDER — DEXCOM G6 TRANSMITTER MISC: 1.0000 | Freq: Every day | 3 refills | Status: DC

## 2021-08-27 NOTE — Telephone Encounter (Signed)
Patient called to find out about his refills. Informed him Dr. Mikey Bussing wanted him to make an appointment. Call transferred to front office.

## 2021-08-30 ENCOUNTER — Encounter: Payer: BC Managed Care – PPO | Admitting: Internal Medicine

## 2021-08-31 ENCOUNTER — Other Ambulatory Visit: Payer: Self-pay | Admitting: Internal Medicine

## 2021-08-31 DIAGNOSIS — F339 Major depressive disorder, recurrent, unspecified: Secondary | ICD-10-CM

## 2021-09-05 ENCOUNTER — Encounter: Payer: BC Managed Care – PPO | Admitting: Internal Medicine

## 2021-09-05 NOTE — Progress Notes (Deleted)
° °  CC: routine check  HPI:[name] a 66 y.o. male who presents for evaluation of ***. Please see individual problem based A/P for details.  HTN  DM with complications - a1c  HLD - recheck lipid  Colonoscopy and flu shot***  Depression, PHQ-9: Based on the patients  Flowsheet Row Office Visit from 03/29/2021 in [facility]  PHQ-9 Total Score 16      score we have ***.  Past Medical History:  Diagnosis Date   Asthma    CAD (coronary artery disease)    stent RCA 2006 (other residual disease). /  nuclear 2008  no ischemia, inferolateral scar.   Cataract    Chronic bronchitis (HCC)    "anytime I get sick it goes into bronchitis; probably get it q yr"   Daily headache    "just recently" (05/10/2014)   Dyslipidemia    Educated about COVID-19 virus infection 01/04/2019   Ejection fraction    EF 55%,echo, 2008, inferolateral hypokinesis,   Erectile dysfunction    Flu-like symptoms 09/30/2018   Gastroparesis    Hypertension    Myocardial infarction (HCC) 2006   Severe obstructive sleep apnea-hypopnea syndrome 04/15/2009   Sleep study 03/15/2009 AHI of 82.2/hr oxygen desaturation nadir of 77%, loud snoring. Did not tolerate CPAP due to mask.   Sprain of right ankle 07/10/2012   Tooth pain 05/23/2017   Type 2 diabetes mellitus with complications (HCC) dx'd 1972   Viral upper respiratory tract infection 05/21/2017   Vomiting 11/14/2016   Review of Systems:   ROS   Physical Exam: There were no vitals filed for this visit.   General: *** HEENT: Conjunctiva nl , antiicteric sclerae, moist mucous membranes, no exudate or erythema Cardiovascular: Normal rate, regular rhythm.  No murmurs, rubs, or gallops Pulmonary : Equal breath sounds, No wheezes, rales, or rhonchi Abdominal: soft, nontender,  bowel sounds present Ext: No edema in lower extremities, no tenderness to palpation of lower extremities.   Assessment & Plan:   See Encounters Tab  for problem based charting.  Patient {GC/GE:[id number]::"discussed with","seen with"} Dr. {JASNK:[id number]::"A. Hoffman","Guilloud","Mullen","Narendra","Raines","Vincent","Williams"}

## 2021-10-02 ENCOUNTER — Telehealth: Payer: Self-pay | Admitting: Dietician

## 2021-10-02 NOTE — Telephone Encounter (Signed)
Says he forgot his appointment, thinks it is his depression, he says it is often hard for him to get out of bed, goes to work and thinks this helps his depression. He was informed about our counselor services. Call transferred to front office to reschedule his appointment.

## 2021-10-03 NOTE — Telephone Encounter (Signed)
Ok thanks, for front desk please schedule follow up appointment with me

## 2021-10-12 ENCOUNTER — Telehealth: Payer: Self-pay | Admitting: Dietician

## 2021-10-12 DIAGNOSIS — E1143 Type 2 diabetes mellitus with diabetic autonomic (poly)neuropathy: Secondary | ICD-10-CM

## 2021-10-12 MED ORDER — INSULIN ASPART 100 UNIT/ML IJ SOLN: INTRAMUSCULAR | 11 refills | Status: DC

## 2021-10-12 NOTE — Telephone Encounter (Signed)
done

## 2021-10-12 NOTE — Telephone Encounter (Signed)
Needs new prescription and coupon for Novolog insuIin 3 vials a month. I called pharmacy with coupon. Please send prescription for Novolog to [facility] on [address]. Has an appointment April 6.

## 2021-10-17 NOTE — Telephone Encounter (Signed)
Called in Novolog coupon to his pharmacy and it did not take anything off his 90$ copay for his Novolog. Asking Durward Mallard for assistance ?

## 2021-10-18 ENCOUNTER — Other Ambulatory Visit (HOSPITAL_COMMUNITY): Payer: Self-pay

## 2021-10-18 NOTE — Telephone Encounter (Signed)
Very helpful- than ?k  you!

## 2021-10-23 NOTE — Telephone Encounter (Signed)
Patient informed. 

## 2021-10-26 DIAGNOSIS — H1045 Other chronic allergic conjunctivitis: Secondary | ICD-10-CM | POA: Diagnosis not present

## 2021-10-26 DIAGNOSIS — H40053 Ocular hypertension, bilateral: Secondary | ICD-10-CM | POA: Diagnosis not present

## 2021-10-26 DIAGNOSIS — Z961 Presence of intraocular lens: Secondary | ICD-10-CM | POA: Diagnosis not present

## 2021-10-26 DIAGNOSIS — H04123 Dry eye syndrome of bilateral lacrimal glands: Secondary | ICD-10-CM | POA: Diagnosis not present

## 2021-11-13 ENCOUNTER — Telehealth: Payer: Self-pay | Admitting: Dietician

## 2021-11-13 DIAGNOSIS — E1143 Type 2 diabetes mellitus with diabetic autonomic (poly)neuropathy: Secondary | ICD-10-CM

## 2021-11-13 NOTE — Telephone Encounter (Addendum)
Pharmacy would not fill Omnipod, waiting on papers from [place]. Has one on now that is going to run out. Left a sample pod at the frotn desk for him to pick up.  ?

## 2021-11-19 ENCOUNTER — Telehealth: Payer: Self-pay

## 2021-11-19 MED ORDER — OMNIPOD DASH PODS (GEN 4) MISC: 11 refills | Status: DC

## 2021-11-19 NOTE — Telephone Encounter (Signed)
Pa for pt ( OMNIPOD DASH ) came through on cover my meds  . Was submitted with office notes and last A1C .. awaiting approval or denial  ?

## 2021-11-19 NOTE — Telephone Encounter (Addendum)
Tyler Wilkinson needs more OMNIPOD DASH pods for his insulin pump. Request prescription for Omnipod Dash pods miscellaneous (not kit)  "fill with Novolog insulin and apply new pod about every 2 days" dispense 15  with refill 11.  Tyler Wilkinson is working on a PA from pharmacy and needs this prescription to complete it. Thank you! ?

## 2021-11-19 NOTE — Telephone Encounter (Signed)
DECISION : ? ? ? ? ?Outcome ?Approvedtoday ?Effective from 11/19/2021 through 11/18/2022. ?Drug ?Omnipod DASH Pods (Gen 4) ?Form ?[employer] Electronic Request Form (CB) ?Original Claim ? ? ? ? (COPY SENT TO PHARMACY ALSO )  ?

## 2021-11-19 NOTE — Addendum Note (Signed)
Addended by: Joni Reining C on: 11/19/2021 11:11 AM ? ? Modules accepted: Orders ? ?

## 2021-11-19 NOTE — Addendum Note (Signed)
Addended by: Baird Cancer on: 11/19/2021 10:47 AM ? ? Modules accepted: Orders ? ?

## 2021-11-20 NOTE — Telephone Encounter (Signed)
Informed that PA approved.  ?

## 2021-11-22 ENCOUNTER — Ambulatory Visit (INDEPENDENT_AMBULATORY_CARE_PROVIDER_SITE_OTHER): Payer: BC Managed Care – PPO | Admitting: Internal Medicine

## 2021-11-22 ENCOUNTER — Ambulatory Visit (INDEPENDENT_AMBULATORY_CARE_PROVIDER_SITE_OTHER): Payer: BC Managed Care – PPO | Admitting: Dietician

## 2021-11-22 ENCOUNTER — Encounter: Payer: Self-pay | Admitting: Dietician

## 2021-11-22 VITALS — BP 115/81 | HR 95 | Temp 98.7°F | Wt 221.5 lb

## 2021-11-22 DIAGNOSIS — E1143 Type 2 diabetes mellitus with diabetic autonomic (poly)neuropathy: Secondary | ICD-10-CM | POA: Diagnosis not present

## 2021-11-22 DIAGNOSIS — E559 Vitamin D deficiency, unspecified: Secondary | ICD-10-CM | POA: Diagnosis not present

## 2021-11-22 DIAGNOSIS — K3184 Gastroparesis: Secondary | ICD-10-CM

## 2021-11-22 DIAGNOSIS — I10 Essential (primary) hypertension: Secondary | ICD-10-CM

## 2021-11-22 DIAGNOSIS — G4733 Obstructive sleep apnea (adult) (pediatric): Secondary | ICD-10-CM

## 2021-11-22 DIAGNOSIS — F339 Major depressive disorder, recurrent, unspecified: Secondary | ICD-10-CM

## 2021-11-22 LAB — GLUCOSE, CAPILLARY: Glucose-Capillary: 69 mg/dL — ABNORMAL LOW (ref 70–99)

## 2021-11-22 LAB — POCT GLYCOSYLATED HEMOGLOBIN (HGB A1C): Hemoglobin A1C: 8.2 % — AB (ref 4.0–5.6)

## 2021-11-22 MED ORDER — BUPROPION HCL ER (XL) 150 MG PO TB24: 150.0000 mg | ORAL_TABLET | Freq: Two times a day (BID) | ORAL | 2 refills | Status: DC

## 2021-11-22 NOTE — Progress Notes (Addendum)
? ? ? ?  ? ? ?Subjective:  ?CC: DM ? ?HPI: ? ?[name] a 66 y.o. male with a past medical history stated below and presents today for DM. Please see problem based assessment and plan for additional details. ? ?Past Medical History:  ?Diagnosis Date  ? Asthma   ? CAD (coronary artery disease)   ? stent RCA 2006 (other residual disease). /  nuclear 2008  no ischemia, inferolateral scar.  ? Cataract   ? Chronic bronchitis ([place])   ? "anytime I get sick it goes into bronchitis; probably get it q yr"  ? Daily headache   ? "just recently" (05/10/2014)  ? Dyslipidemia   ? Educated about COVID-19 virus infection 01/04/2019  ? Ejection fraction   ? EF 55%,echo, 2008, inferolateral hypokinesis,  ? Erectile dysfunction   ? Flu-like symptoms 09/30/2018  ? Gastroparesis   ? Hypertension   ? Myocardial infarction [facility]) 2006  ? Severe obstructive sleep apnea-hypopnea syndrome 04/15/2009  ? Sleep study 03/15/2009 AHI of 82.2/hr oxygen desaturation nadir of 77%, loud snoring. Did not tolerate CPAP due to mask.  ? Sprain of right ankle 07/10/2012  ? Tooth pain 05/23/2017  ? Type 2 diabetes mellitus with complications ([place]) dx'd 1972  ? Viral upper respiratory tract infection 05/21/2017  ? Vomiting 11/14/2016  ? ? ?Current Outpatient Medications on File Prior to Visit  ?Medication Sig Dispense Refill  ? albuterol (VENTOLIN HFA) 108 (90 Base) MCG/ACT inhaler Inhale 2 puffs into the lungs every 6 (six) hours as needed for wheezing or shortness of breath. 1 each 1  ? Arginine 500 MG CAPS Take 500 mg by mouth daily.    ? aspirin EC 81 MG tablet Take 1 tablet (81 mg total) by mouth daily.    ? benazepril (LOTENSIN) 40 MG tablet Take 1 tablet (40 mg total) by mouth daily. 90 tablet 3  ? Blood Glucose Monitoring Suppl (BAYER CONTOUR NEXT USB MONITOR) w/Device KIT Check blood sugar up to 6 times a day 1 kit 1  ? carvedilol (COREG) 6.25 MG tablet Take 1 tablet (6.25 mg total) by mouth 2 (two) times daily. 180 tablet 3  ? Continuous Blood  Gluc Receiver (DEXCOM G6 RECEIVER) DEVI 1 each by Does not apply route 5 (five) times daily. 1 each 0  ? Continuous Blood Gluc Sensor (DEXCOM G6 SENSOR) MISC USE 1 EACH BY DOES NOT APPLY ROUTE 5 TIMES DAILY 3 each 0  ? Continuous Blood Gluc Transmit (DEXCOM G6 TRANSMITTER) MISC 1 each by Does not apply route 5 (five) times daily. 1 each 3  ? COSOPT PF 22.3-6.8 MG/ML SOLN ophthalmic solution Place 1 drop into both eyes 2 (two) times daily.   3  ? Diclofenac Sodium (PENNSAID) 2 % SOLN Apply 2 g topically 2 (two) times daily as needed (to affected area). 112 g 3  ? Dulaglutide (TRULICITY) 1.5 JA/2.5KN SOPN Inject 1.5 mg into the skin once a week. 2 mL 11  ? Flaxseed, Linseed, (FLAX SEED OIL PO) Take 1 tablet by mouth daily.     ? fluticasone (FLONASE) 50 MCG/ACT nasal spray Place 2 sprays into both nostrils daily. 16 g 0  ? fluticasone (FLONASE) 50 MCG/ACT nasal spray Place 1 spray into both nostrils daily.    ? glucose blood (BAYER CONTOUR NEXT TEST) test strip Check blood sugar up to 6 times a day 200 each 5  ? guaiFENesin-Codeine 200-10 MG/5ML LIQD Take 5 mLs by mouth every 4 (four) hours as  needed. 210 mL 0  ? hydrochlorothiazide (HYDRODIURIL) 25 MG tablet Take 1 tablet (25 mg total) by mouth daily. 90 tablet 3  ? insulin aspart (NOVOLOG) 100 UNIT/ML injection Use to fill omnipod insulin pump up to 90 units a day. 30 mL 11  ? Insulin Disposable Pump (OMNIPOD DASH PODS, GEN 4,) MISC Fill with Novolog insulin and apply new pod about every 2 days 15 each 11  ? Insulin Syringe-Needle U-100 (INSULIN SYRINGE .5CC/31GX5/16") 31G X 5/16" 0.5 ML MISC Use to inject insulin 3 times a day ?Dx code 250.00 100 each 12  ? latanoprost (XALATAN) 0.005 % ophthalmic solution Place 1 drop into both eyes at bedtime.    ? levocetirizine (XYZAL) 5 MG tablet Take 1 tablet (5 mg total) by mouth every evening. 90 tablet 3  ? Misc. Devices (PULSE OXIMETER FOR FINGER) MISC Take pulse ox level in the morning and at night. If less than or equal  to 94 please call our clinic. 1 each 0  ? ONETOUCH DELICA LANCETS 40J MISC Use to check blood sugars up to 4 times a day. Dx code:E11.65 200 each 11  ? rosuvastatin (CRESTOR) 5 MG tablet Take 1 tablet (5 mg total) by mouth daily. 90 tablet 3  ? sildenafil (VIAGRA) 100 MG tablet Take 1 tablet (100 mg total) by mouth as needed for erectile dysfunction. 20 tablet 3  ? tamsulosin (FLOMAX) 0.4 MG CAPS capsule Take 2 capsules (0.8 mg total) by mouth daily after breakfast. 180 capsule 1  ? triamcinolone (NASACORT AQ) 55 MCG/ACT AERO nasal inhaler Place 1 spray into the nose 2 (two) times daily. 1 Inhaler 0  ? VITAMIN D, CHOLECALCIFEROL, PO Take 1 capsule by mouth daily.    ? ?No current facility-administered medications on file prior to visit.  ? ? ?Family History  ?Problem Relation Age of Onset  ? Diabetes Mother   ? Alcoholism Father   ? ? ?Social History  ? ?Socioeconomic History  ? Marital status: Single  ?  Spouse name: Not on file  ? Number of children: Not on file  ? Years of education: 93  ? Highest education level: Not on file  ?Occupational History  ? Occupation: Presenter, broadcasting in college  ?  Employer: [employer]  ?Tobacco Use  ? Smoking status: Former  ?  Years: 0.20  ?  Types: Cigarettes  ?  Quit date: 01/26/1973  ?  Years since quitting: 48.8  ? Smokeless tobacco: Never  ?Substance and Sexual Activity  ? Alcohol use: Yes  ?  Alcohol/week: 0.0 standard drinks  ?  Comment: Beer rarely.  ? Drug use: Yes  ?  Types: Marijuana  ?  Comment: Occasionally.  ? Sexual activity: Not on file  ?Other Topics Concern  ? Not on file  ?Social History Narrative  ? Right handed  ? One story home  ? Drinks caffeine  ? ?Social Determinants of Health  ? ?Financial Resource Strain: Not on file  ?Food Insecurity: Not on file  ?Transportation Needs: Not on file  ?Physical Activity: Not on file  ?Stress: Not on file  ?Social Connections: Not on file  ?Intimate Partner Violence: Not on file  ? ? ?Review of Systems: ?ROS negative  except for what is noted on the assessment and plan. ? ?Objective:  ? ?Vitals:  ? 11/22/21 1441  ?BP: 115/81  ?Pulse: 95  ?Temp: 98.7 ?F (37.1 ?C)  ?TempSrc: Oral  ?SpO2: 99%  ?Weight: 221 lb 8 oz (100.5 kg)  ? ? ?  Physical Exam: ?Gen: A&O x3 and in no apparent distress, well appearing and nourished. ?Neck: no masses or nodules, AROM intact. ?CV: RRR, no murmurs, S1/S2 presents  ?Resp: Clear to ascultation bilaterally  ?MSK: Grossly normal AROM and strength x4 extremities. ?Skin: good skin turgor, no rashes, unusual bruising, or prominent lesions.  ?Neuro: No focal deficits, grossly normal sensation and coordination.  ?Psych: Oriented x3 and responding appropriately. Intact memory, anxious and depressed mood, intact judgement, affect, and insight.  ? ? ?Assessment & Plan:  ?See Encounters Tab for problem based charting. ? ?Patient discussed with Dr. Philipp Ovens ? ? ?Marianna Payment, D.O. ?[facility]  PGY-3 ?Pager: [phone]  Phone: [phone] ?Date 11/29/2021  Time 9:50 AM ? ?

## 2021-11-22 NOTE — Patient Instructions (Addendum)
Thank you, [name] for allowing [place] to provide your care today. Today we discussed depression .   ? ?Labs/Tests Ordered: ? ?Lab Orders    ?     Glucose, capillary    ?     BMP8+Anion Gap    ?     Vitamin D (25 hydroxy)    ?     Albumin    ?     POC Hbg A1C     ? ?Referrals Ordered:  ? ?Referral Orders    ?     Ambulatory referral to Sleep Studies     ? ?Medication Changes:   ? ?Meds ordered this encounter  ?Medications  ? buPROPion (WELLBUTRIN XL) 150 MG 24 hr tablet  ?  Sig: Take 1 tablet (150 mg total) by mouth in the morning and at bedtime.  ?  Dispense:  60 tablet  ?  Refill:  2  ?  ? ?Health Maintenance Screening: ?Diabetes Health Maintenance Due  ?Topic Date Due  ? LIPID PANEL  06/29/2021  ? FOOT EXAM  02/15/2022  ? HEMOGLOBIN A1C  02/21/2022  ? OPHTHALMOLOGY EXAM  03/28/2022  ?  ? ?Instructions:  ? ?Follow up:  1 week   ? ?Remember: If you have any questions or concerns, call our clinic at [phone] or after hours call [phone] and ask for the internal medicine resident on call. ? ?Dellia Cloud, D.O. ?[facility] ? ?  ?

## 2021-11-22 NOTE — Progress Notes (Signed)
Diabetes Self-Management Education ? ?Visit Type: Follow-up ? ?Appt. Start Time: 1555 Appt. End Time: 1635 ? ?11/22/2021 ? ?[name], identified by name and date of birth, is a 66 y.o. male with a diagnosis of Diabetes: Type 2.  ? ?ASSESSMENT ? ? ? Diabetes Self-Management Education - 11/22/21 1600   ? ?  ? Visit Information  ? Visit Type Follow-up   ?  ? Initial Visit  ? Diabetes Type Type 2   ? Are you currently following a meal plan? No   ? Are you taking your medications as prescribed? No   he is not entering the correct blood sugar into his insulin pump  ? Date Diagnosed 2000   ?  ? Health Coping  ? How would you rate your overall health? Good   ?  ? Psychosocial Assessment  ? Patient Belief/Attitude about Diabetes Motivated to manage diabetes   ? Self-care barriers Lack of material resources;Debilitated state due to current medical condition   ADHD  ? Self-management support Doctor's office;Family;CDE visits   ? Patient Concerns Medication;Glycemic Control   ? Special Needs Simplified materials   ? Preferred Learning Style Hands on;Visual   ? Learning Readiness Ready   ? How often do you need to have someone help you when you read instructions, pamphlets, or other written materials from your doctor or pharmacy? 3 - Sometimes   ? What is the last grade level you completed in school? 12   ?  ? Pre-Education Assessment  ? Patient understands the diabetes disease and treatment process. Demonstrates understanding / competency   ? Patient understands incorporating nutritional management into lifestyle. Demonstrates understanding / competency   ? Patient undertands incorporating physical activity into lifestyle. Demonstrates understanding / competency   ? Patient understands using medications safely. Needs Review   needs help with using pump and setting up new PDM  ? Patient understands monitoring blood glucose, interpreting and using results Demonstrates understanding / competency   ? Patient understands  prevention, detection, and treatment of acute complications. Demonstrates understanding / competency   ? Patient understands prevention, detection, and treatment of chronic complications. Demonstrates understanding / competency   ? Patient understands how to develop strategies to address psychosocial issues. Demonstrates understanding / competency   ? Patient understands how to develop strategies to promote health/change behavior. Demonstrates understanding / competency   ?  ? Complications  ? Last HgB A1C per patient/outside source 8.2 %   ? How often do you check your blood sugar? > 4 times/day   ? Fasting Blood glucose range (mg/dL) 70-962;836-629;476-546   ? Postprandial Blood glucose range (mg/dL) 50-354;656-812;751-700;>174   ? Number of hypoglycemic episodes per month 5   ? Can you tell when your blood sugar is low? Yes   ? What do you do if your blood sugar is low? drinks soda   ? Number of hyperglycemic episodes per week 10   ? Can you tell when your blood sugar is high? No   ? Have you had a dilated eye exam in the past 12 months? Yes   ? Have you had a dental exam in the past 12 months? Yes   ? Are you checking your feet? Yes   ? How many days per week are you checking your feet? 7   ?  ? Dietary Intake  ? Breakfast coke   ? Lunch hamburger, soda and chips   ? Dinner hot dog and soda   ?  ? Exercise  ?  Exercise Type ADL's;Light (walking / raking leaves)   ? How many days per week to you exercise? 5   ? How many minutes per day do you exercise? 30   ? Total minutes per week of exercise 150   ?  ? Patient Education  ? Previous Diabetes Education Yes (please comment)   here  ? Medications Reviewed patients medication for diabetes, action, purpose, timing of dose and side effects.;Other (comment)   assisted with insulin pump and encouraged him to use pump correctly  ?  ? Individualized Goals (developed by patient)  ? Medications take my medication as prescribed   put in correct blood sugars into bolus  calculator  ?  ? Outcomes  ? Expected Outcomes Demonstrated interest in learning. Expect positive outcomes   ? Future DMSE 3-4 months   ? Program Status Not Completed   ? ?  ?  ? ?  ? ? ?Individualized Plan for Diabetes Self-Management Training:  ? ?Learning Objective:  Patient will have a greater understanding of diabetes self-management. ?Patient education plan is to attend individual and/or group sessions per assessed needs and concerns. ?  ?Plan:  ? ?There are no Patient Instructions on file for this visit. ? ?Expected Outcomes:  Demonstrated interest in learning. Expect positive outcomes ? ?Education material provided: Diabetes Resources ? ?If problems or questions, patient to contact team via:  Phone ? ?Future DSME appointment: 3-4 months ?Norm Parcel, RD ?11/22/2021 ?5:04 PM. ? ?

## 2021-11-25 ENCOUNTER — Encounter: Payer: Self-pay | Admitting: Internal Medicine

## 2021-11-25 NOTE — Assessment & Plan Note (Addendum)
Patient had A1c performed today. Will need to discuss this in more detail at subsequent visit. He will need adjustments to his insulin regimen to achieve goal of < 7. ? ? ?HPI: ?Patient presents for further evaluation and management of DM. He is currently taking Trulicity 1.5 mg weekly, novolog 90 units in his insulin pump per day. He is tolerating it well. Patient did have a low blood sugar this morning in the office but states that he did not eat breakfast prior to coming in. A1c performed today and shown below.  ? ? ?  Latest Ref Rng & Units 11/22/2021  ?  2:56 PM 02/15/2021  ? 11:11 AM  ?Hemoglobin A1C  ?Hemoglobin-A1c 4.0 - 5.6 % 8.2   7.2    ?  ? ? ?Assessment/Plan: ?- Discuss lifestyle and dietary changes at next appt.  ?- Patient has been working with Lupita Leash and Dr. Mikey Bussing regarding simplifying his insulin regimen  ?- Remind patient to make a follow up appointment with Lupita Leash.  ?

## 2021-11-25 NOTE — Assessment & Plan Note (Addendum)
Patient has a prior BMP showing hypocalcemia and low vitamin D.  ? ?Plan: ?- repeat Bmp ?- Repeat Vit D  ? ? ?**ADDENDUM** ? ?Hypocalcemia has resolved. Normal vitamin D level. ?

## 2021-11-25 NOTE — Assessment & Plan Note (Addendum)
Patient has a history of OSA and was previously using a CPAP. He states that he would like to try the CPAP again. He will need a new sleep study. Order placed today.  ?

## 2021-11-25 NOTE — Assessment & Plan Note (Signed)
Patient presents for a follow up appointment for depression. He states that he is sleeping too much, feeling lonely, decreased appetites, difficulty with memory, feeling guilty and worthless about his symptoms/ He states that he has anhedonia in that he does not feeling playing with his grandkids like he use to. He denies SI/HI.  ? ?Plan: ?- Discussed counseling and he is open to the idea. ?- Discussed increasing Wellbutrin 50 150 mg BID ?

## 2021-11-29 ENCOUNTER — Encounter: Payer: Self-pay | Admitting: Internal Medicine

## 2021-11-29 ENCOUNTER — Ambulatory Visit (INDEPENDENT_AMBULATORY_CARE_PROVIDER_SITE_OTHER): Payer: BC Managed Care – PPO | Admitting: Internal Medicine

## 2021-11-29 VITALS — BP 139/71 | HR 87 | Ht 72.0 in | Wt 223.0 lb

## 2021-11-29 DIAGNOSIS — F339 Major depressive disorder, recurrent, unspecified: Secondary | ICD-10-CM

## 2021-11-29 DIAGNOSIS — K3184 Gastroparesis: Secondary | ICD-10-CM

## 2021-11-29 DIAGNOSIS — E1143 Type 2 diabetes mellitus with diabetic autonomic (poly)neuropathy: Secondary | ICD-10-CM

## 2021-11-29 DIAGNOSIS — Z0000 Encounter for general adult medical examination without abnormal findings: Secondary | ICD-10-CM

## 2021-11-29 NOTE — Patient Instructions (Signed)
Thank you, [name] for allowing [place] to provide your care today. Today we discussed vitamin D, depression, blood pressure, diabetes.   ? ?Labs/Tests Ordered: ?Lab Orders  ?No laboratory test(s) ordered today  ?  ? ?Referrals Ordered:  ?Referral for counseling ? ?Medication Changes:  ?Stop taking Vitamin D ? ?No orders of the defined types were placed in this encounter. ?  ? ?Health Maintenance Screening: ?Diabetes Health Maintenance Due  ?Topic Date Due  ? LIPID PANEL  06/29/2021  ? FOOT EXAM  02/15/2022  ? HEMOGLOBIN A1C  02/21/2022  ? OPHTHALMOLOGY EXAM  03/28/2022  ?  ? ?Instructions:  ? ?Follow up:  next available with Dr. Heber    ? ?Remember: If you have any questions or concerns, call our clinic at [phone] or after hours call [phone] and ask for the internal medicine resident on call. ? ?Marianna Payment, D.O. ?[facility] ? ? ? ?

## 2021-11-29 NOTE — Progress Notes (Signed)
? ? ?Subjective:  ?CC: depression ? ?HPI: ? ?[name] a 66 y.o. male with a past medical history stated below and presents today for depression. Please see problem based assessment and plan for additional details. ? ?Past Medical History:  ?Diagnosis Date  ? Asthma   ? CAD (coronary artery disease)   ? stent RCA 2006 (other residual disease). /  nuclear 2008  no ischemia, inferolateral scar.  ? Cataract   ? Chronic bronchitis ([place])   ? "anytime I get sick it goes into bronchitis; probably get it q yr"  ? Daily headache   ? "just recently" (05/10/2014)  ? Dyslipidemia   ? Educated about COVID-19 virus infection 01/04/2019  ? Ejection fraction   ? EF 55%,echo, 2008, inferolateral hypokinesis,  ? Erectile dysfunction   ? Flu-like symptoms 09/30/2018  ? Gastroparesis   ? Hypertension   ? Myocardial infarction [facility]) 2006  ? Severe obstructive sleep apnea-hypopnea syndrome 04/15/2009  ? Sleep study 03/15/2009 AHI of 82.2/hr oxygen desaturation nadir of 77%, loud snoring. Did not tolerate CPAP due to mask.  ? Sprain of right ankle 07/10/2012  ? Tooth pain 05/23/2017  ? Type 2 diabetes mellitus with complications ([place]) dx'd 1972  ? Viral upper respiratory tract infection 05/21/2017  ? Vomiting 11/14/2016  ? ? ?Current Outpatient Medications on File Prior to Visit  ?Medication Sig Dispense Refill  ? albuterol (VENTOLIN HFA) 108 (90 Base) MCG/ACT inhaler Inhale 2 puffs into the lungs every 6 (six) hours as needed for wheezing or shortness of breath. 1 each 1  ? Arginine 500 MG CAPS Take 500 mg by mouth daily.    ? aspirin EC 81 MG tablet Take 1 tablet (81 mg total) by mouth daily.    ? benazepril (LOTENSIN) 40 MG tablet Take 1 tablet (40 mg total) by mouth daily. 90 tablet 3  ? Blood Glucose Monitoring Suppl (BAYER CONTOUR NEXT USB MONITOR) w/Device KIT Check blood sugar up to 6 times a day 1 kit 1  ? buPROPion (WELLBUTRIN XL) 150 MG 24 hr tablet Take 1 tablet (150 mg total) by mouth in the morning and at bedtime. 60  tablet 2  ? carvedilol (COREG) 6.25 MG tablet Take 1 tablet (6.25 mg total) by mouth 2 (two) times daily. 180 tablet 3  ? Continuous Blood Gluc Receiver (DEXCOM G6 RECEIVER) DEVI 1 each by Does not apply route 5 (five) times daily. 1 each 0  ? Continuous Blood Gluc Sensor (DEXCOM G6 SENSOR) MISC USE 1 EACH BY DOES NOT APPLY ROUTE 5 TIMES DAILY 3 each 0  ? Continuous Blood Gluc Transmit (DEXCOM G6 TRANSMITTER) MISC 1 each by Does not apply route 5 (five) times daily. 1 each 3  ? COSOPT PF 22.3-6.8 MG/ML SOLN ophthalmic solution Place 1 drop into both eyes 2 (two) times daily.   3  ? Diclofenac Sodium (PENNSAID) 2 % SOLN Apply 2 g topically 2 (two) times daily as needed (to affected area). 112 g 3  ? Dulaglutide (TRULICITY) 1.5 XB/3.5HG SOPN Inject 1.5 mg into the skin once a week. 2 mL 11  ? Flaxseed, Linseed, (FLAX SEED OIL PO) Take 1 tablet by mouth daily.     ? fluticasone (FLONASE) 50 MCG/ACT nasal spray Place 2 sprays into both nostrils daily. 16 g 0  ? fluticasone (FLONASE) 50 MCG/ACT nasal spray Place 1 spray into both nostrils daily.    ? glucose blood (BAYER CONTOUR NEXT TEST) test strip Check blood sugar up to 6  times a day 200 each 5  ? guaiFENesin-Codeine 200-10 MG/5ML LIQD Take 5 mLs by mouth every 4 (four) hours as needed. 210 mL 0  ? hydrochlorothiazide (HYDRODIURIL) 25 MG tablet Take 1 tablet (25 mg total) by mouth daily. 90 tablet 3  ? insulin aspart (NOVOLOG) 100 UNIT/ML injection Use to fill omnipod insulin pump up to 90 units a day. 30 mL 11  ? Insulin Disposable Pump (OMNIPOD DASH PODS, GEN 4,) MISC Fill with Novolog insulin and apply new pod about every 2 days 15 each 11  ? Insulin Syringe-Needle U-100 (INSULIN SYRINGE .5CC/31GX5/16") 31G X 5/16" 0.5 ML MISC Use to inject insulin 3 times a day ?Dx code 250.00 100 each 12  ? latanoprost (XALATAN) 0.005 % ophthalmic solution Place 1 drop into both eyes at bedtime.    ? levocetirizine (XYZAL) 5 MG tablet Take 1 tablet (5 mg total) by mouth every  evening. 90 tablet 3  ? Misc. Devices (PULSE OXIMETER FOR FINGER) MISC Take pulse ox level in the morning and at night. If less than or equal to 94 please call our clinic. 1 each 0  ? ONETOUCH DELICA LANCETS 33L MISC Use to check blood sugars up to 4 times a day. Dx code:E11.65 200 each 11  ? rosuvastatin (CRESTOR) 5 MG tablet Take 1 tablet (5 mg total) by mouth daily. 90 tablet 3  ? sildenafil (VIAGRA) 100 MG tablet Take 1 tablet (100 mg total) by mouth as needed for erectile dysfunction. 20 tablet 3  ? tamsulosin (FLOMAX) 0.4 MG CAPS capsule Take 2 capsules (0.8 mg total) by mouth daily after breakfast. 180 capsule 1  ? triamcinolone (NASACORT AQ) 55 MCG/ACT AERO nasal inhaler Place 1 spray into the nose 2 (two) times daily. 1 Inhaler 0  ? ?No current facility-administered medications on file prior to visit.  ? ? ?Family History  ?Problem Relation Age of Onset  ? Diabetes Mother   ? Alcoholism Father   ? ? ?Social History  ? ?Socioeconomic History  ? Marital status: Single  ?  Spouse name: Not on file  ? Number of children: Not on file  ? Years of education: 56  ? Highest education level: Not on file  ?Occupational History  ? Occupation: Presenter, broadcasting in college  ?  Employer: [employer]  ?Tobacco Use  ? Smoking status: Former  ?  Years: 0.20  ?  Types: Cigarettes  ?  Quit date: 01/26/1973  ?  Years since quitting: 48.8  ? Smokeless tobacco: Never  ?Substance and Sexual Activity  ? Alcohol use: Yes  ?  Alcohol/week: 0.0 standard drinks  ?  Comment: Beer rarely.  ? Drug use: Yes  ?  Types: Marijuana  ?  Comment: Occasionally.  ? Sexual activity: Not on file  ?Other Topics Concern  ? Not on file  ?Social History Narrative  ? Right handed  ? One story home  ? Drinks caffeine  ? ?Social Determinants of Health  ? ?Financial Resource Strain: Not on file  ?Food Insecurity: Not on file  ?Transportation Needs: Not on file  ?Physical Activity: Not on file  ?Stress: Not on file  ?Social Connections: Not on file   ?Intimate Partner Violence: Not on file  ? ? ?Review of Systems: ?ROS negative except for what is noted on the assessment and plan. ? ?Objective:  ? ?Vitals:  ? 11/29/21 1501  ?BP: 139/71  ?Pulse: 87  ?SpO2: 98%  ?Weight: 223 lb (101.2 kg)  ?Height: 6' (  1.829 m)  ? ? ?Physical Exam: ?Gen: A&O x3 and in no apparent distress, well appearing and nourished.CV: RRR, no murmurs, S1/S2 presents  ?Resp: Clear to ascultation bilaterally  ?Abd: BS (+) x4, soft, non-tender abdomen, without hepatosplenomegaly or masses ?Skin: good skin turgor, no rashes, unusual bruising, or prominent lesions.  ?Neuro: No focal deficits, grossly normal sensation and coordination.  ?Psych: Oriented x3, endorses depressed mood, pressured speech.   ? ? ?Assessment & Plan:  ?See Encounters Tab for problem based charting. ? ?Patient discussed with Dr. Jimmye Norman ? ? ?Marianna Payment, D.O. ?[facility]  PGY-3 ?Pager: [phone]  Phone: [phone] ?Date 11/30/2021  Time 10:01 AM ? ?

## 2021-11-29 NOTE — Progress Notes (Signed)
Internal Medicine Clinic Attending  Case discussed with Dr. Coe  At the time of the visit.  We reviewed the resident's history and exam and pertinent patient test results.  I agree with the assessment, diagnosis, and plan of care documented in the resident's note.  

## 2021-11-29 NOTE — Assessment & Plan Note (Addendum)
HPI: ?Patient presents for further evaluation and management of HTN. ? ?Blood pressure well controlled on carvedilol 6.25 BID, benzapril 40 mg daily, and HCTZ 25 mg daily. Tolerating meds well. ? ?Recent blood pressures: ?BP Readings from Last 3 Encounters:  ?11/22/21 115/81  ?05/16/21 135/80  ?03/29/21 134/84  ? ? ?Assessment/Plan: ?- Cont current HTN meds ?- BMP performed today ? ?

## 2021-11-30 ENCOUNTER — Encounter: Payer: Self-pay | Admitting: Internal Medicine

## 2021-11-30 NOTE — Assessment & Plan Note (Signed)
Patient present for reevaluation of his diabetes. Last A1c was elevated at 8.2. We spent the majority of the time talking about life style modifications to help reduce his A1c. We dicussed decreasing processed foods, reducing carb intake (soft drinks), and increasing whole foods. We discussed having a consistent diet and try to perform 30-40 minutes of moderate intensity exercise daily. I recommended having patient see Lupita Leash in the near future.   ? ? ?

## 2021-11-30 NOTE — Assessment & Plan Note (Signed)
Patient states that he is tolerating the increased dose of Wellbutrin without side effects. He states that his depressive symptoms are stable without SI/HI. I counseled him regarding the need for counseling combined with medication management. Plan to refer him to counseling today. I am concerned that the patient may have additional underlying behavior health conditions including bipolar 2 with hypomanic episodes. He denies overt mania in the past. It is important to note that the patient has made some inappropriate comments regarding his desire to see a male counselor. Specifically, he inquired if he could see an attractive provider. I informed him that this is inappropriate and to refrain from make such comments in the future.  ?

## 2021-11-30 NOTE — Assessment & Plan Note (Signed)
Discussed need for colonoscopy.  ?

## 2021-12-04 ENCOUNTER — Encounter: Payer: Self-pay | Admitting: Internal Medicine

## 2021-12-04 LAB — BMP8+ANION GAP
BUN/Creatinine Ratio: 13 (ref 10–24)
BUN: 15 mg/dL (ref 8–27)
Calcium: 9.9 mg/dL (ref 8.6–10.2)
Chloride: 104 mmol/L (ref 96–106)
Creatinine, Ser: 1.14 mg/dL (ref 0.76–1.27)
Glucose: 105 mg/dL — ABNORMAL HIGH (ref 70–99)
Potassium: 4.3 mmol/L (ref 3.5–5.2)
Sodium: 140 mmol/L (ref 134–144)
eGFR: 71 mL/min/{1.73_m2} (ref >59)

## 2021-12-04 LAB — ALBUMIN: Albumin: 4.2 g/dL (ref 3.8–4.8)

## 2021-12-04 LAB — VITAMIN D 25 HYDROXY (VIT D DEFICIENCY, FRACTURES): Vit D, 25-Hydroxy: 51 ng/mL (ref 30.0–100.0)

## 2021-12-12 NOTE — Progress Notes (Signed)
Internal Medicine Clinic Attending  Case discussed with Dr. Coe  At the time of the visit.  We reviewed the resident's history and exam and pertinent patient test results.  I agree with the assessment, diagnosis, and plan of care documented in the resident's note.  

## 2021-12-31 ENCOUNTER — Institutional Professional Consult (permissible substitution): Payer: BC Managed Care – PPO | Admitting: Behavioral Health

## 2021-12-31 ENCOUNTER — Other Ambulatory Visit: Payer: Self-pay

## 2021-12-31 DIAGNOSIS — E1143 Type 2 diabetes mellitus with diabetic autonomic (poly)neuropathy: Secondary | ICD-10-CM

## 2021-12-31 MED ORDER — DEXCOM G6 SENSOR MISC: 1.0000 | 12 refills | Status: DC

## 2022-01-03 ENCOUNTER — Encounter: Payer: BC Managed Care – PPO | Admitting: Internal Medicine

## 2022-01-24 ENCOUNTER — Telehealth: Payer: Self-pay | Admitting: Behavioral Health

## 2022-01-24 ENCOUNTER — Institutional Professional Consult (permissible substitution): Payer: BC Managed Care – PPO | Admitting: Behavioral Health

## 2022-01-24 NOTE — Telephone Encounter (Signed)
Lft msgs for Pt re: today's telehealth call. Pt can RC to [facility] & r/s @ his convenience.   Dr. Monna Fam

## 2022-02-08 ENCOUNTER — Encounter: Payer: Self-pay | Admitting: Podiatry

## 2022-02-08 ENCOUNTER — Ambulatory Visit (INDEPENDENT_AMBULATORY_CARE_PROVIDER_SITE_OTHER): Payer: BC Managed Care – PPO | Admitting: Podiatry

## 2022-02-08 DIAGNOSIS — M76821 Posterior tibial tendinitis, right leg: Secondary | ICD-10-CM | POA: Diagnosis not present

## 2022-02-08 DIAGNOSIS — M79671 Pain in right foot: Secondary | ICD-10-CM

## 2022-02-08 MED ORDER — MELOXICAM 15 MG PO TABS: 15.0000 mg | ORAL_TABLET | Freq: Every day | ORAL | 0 refills | Status: DC

## 2022-02-14 ENCOUNTER — Encounter: Payer: Self-pay | Admitting: Internal Medicine

## 2022-02-14 ENCOUNTER — Ambulatory Visit (INDEPENDENT_AMBULATORY_CARE_PROVIDER_SITE_OTHER): Payer: BC Managed Care – PPO | Admitting: Internal Medicine

## 2022-02-14 ENCOUNTER — Telehealth: Payer: Self-pay | Admitting: Dietician

## 2022-02-14 ENCOUNTER — Other Ambulatory Visit: Payer: Self-pay

## 2022-02-14 VITALS — BP 156/87 | HR 80 | Temp 97.9°F | Ht 72.0 in | Wt 228.1 lb

## 2022-02-14 DIAGNOSIS — E1143 Type 2 diabetes mellitus with diabetic autonomic (poly)neuropathy: Secondary | ICD-10-CM | POA: Diagnosis not present

## 2022-02-14 DIAGNOSIS — I700 Atherosclerosis of aorta: Secondary | ICD-10-CM

## 2022-02-14 DIAGNOSIS — G4733 Obstructive sleep apnea (adult) (pediatric): Secondary | ICD-10-CM | POA: Diagnosis not present

## 2022-02-14 DIAGNOSIS — Z794 Long term (current) use of insulin: Secondary | ICD-10-CM

## 2022-02-14 DIAGNOSIS — N401 Enlarged prostate with lower urinary tract symptoms: Secondary | ICD-10-CM | POA: Diagnosis not present

## 2022-02-14 DIAGNOSIS — F339 Major depressive disorder, recurrent, unspecified: Secondary | ICD-10-CM

## 2022-02-14 DIAGNOSIS — F338 Other recurrent depressive disorders: Secondary | ICD-10-CM | POA: Diagnosis not present

## 2022-02-14 LAB — POCT GLYCOSYLATED HEMOGLOBIN (HGB A1C): Hemoglobin A1C: 7.9 % — AB (ref 4.0–5.6)

## 2022-02-14 LAB — GLUCOSE, CAPILLARY: Glucose-Capillary: 223 mg/dL — ABNORMAL HIGH (ref 70–99)

## 2022-02-14 MED ORDER — TAMSULOSIN HCL 0.4 MG PO CAPS: 0.8000 mg | ORAL_CAPSULE | Freq: Every day | ORAL | 1 refills | Status: DC

## 2022-02-14 MED ORDER — BUPROPION HCL ER (XL) 300 MG PO TB24: 300.0000 mg | ORAL_TABLET | Freq: Every day | ORAL | 3 refills | Status: DC

## 2022-02-14 MED ORDER — AMLODIPINE BESY-BENAZEPRIL HCL 5-40 MG PO CAPS: 1.0000 | ORAL_CAPSULE | Freq: Every day | ORAL | 3 refills | Status: DC

## 2022-02-14 MED ORDER — SILDENAFIL CITRATE 100 MG PO TABS: 100.0000 mg | ORAL_TABLET | ORAL | 3 refills | Status: DC | PRN

## 2022-02-14 NOTE — Telephone Encounter (Signed)
Diabetes support provided °

## 2022-02-14 NOTE — Progress Notes (Signed)
Established Patient Office Visit  Subjective   Patient ID: Tyler Wilkinson, male    DOB: Aug 04, 1956  Age: 66 y.o. MRN: [id number]  Chief Complaint  Patient presents with   Check-up Visit    Right foot pain. Check blood sugar.    Tyler Wilkinson reports that his depression is doing a little bit better after restarting Wellbutrin he is currently taking 150 mg twice a day.  He still feels quite fatigued despite his retirement he is still doing some work after retirement.  He has been diagnosed with obstructive sleep apnea but never used CPAP much.  He saw a resident in our clinic who referred him back for sleep studies however it appears this referral was closed without action.  He was looking to have a repeat Pap titration however in discussing with him I am not sure that he truly needs this his weight essentially is unchanged.  And with newer machines I think that an AutoPap function would adjust to the level of pressure needed for him.   He has been using his OmniPod and CGM.  We don and I reviewed his CGM data together he is still having some overnight hyperglycemia, we have adjusted his basal insulin rate from 1.3-1.35.  He has been seen in podiatry for tendinitis of his right foot notes that it is improving.       Objective:     BP (!) 156/87 (BP Location: Left Arm, Patient Position: Sitting, Cuff Size: Normal)   Pulse 80   Temp 97.9 F (36.6 C) (Oral)   Ht 6' (1.829 m)   Wt 228 lb 1.6 oz (103.5 kg)   SpO2 99% Comment: RA  BMI 30.94 kg/m  BP Readings from Last 3 Encounters:  02/14/22 (!) 156/87  11/29/21 139/71  11/22/21 115/81   Wt Readings from Last 3 Encounters:  02/14/22 228 lb 1.6 oz (103.5 kg)  11/29/21 223 lb (101.2 kg)  11/22/21 221 lb 8 oz (100.5 kg)      Physical Exam Constitutional:      Appearance: Normal appearance. He is obese. He is not ill-appearing.  Cardiovascular:     Rate and Rhythm: Normal rate and regular rhythm.  Pulmonary:     Effort: Pulmonary  effort is normal.     Breath sounds: Normal breath sounds.  Musculoskeletal:     Right lower leg: No edema.     Left lower leg: No edema.  Neurological:     Mental Status: He is alert.  Psychiatric:        Mood and Affect: Mood normal.      Results for orders placed or performed in visit on 02/14/22  Glucose, capillary  Result Value Ref Range   Glucose-Capillary 223 (H) 70 - 99 mg/dL  POC Hbg B0J  Result Value Ref Range   Hemoglobin A1C 7.9 (A) 4.0 - 5.6 %   HbA1c POC (<> result, manual entry)     HbA1c, POC (prediabetic range)     HbA1c, POC (controlled diabetic range)      Last hemoglobin A1c Lab Results  Component Value Date   HGBA1C 7.9 (A) 02/14/2022      The ASCVD Risk score (Arnett DK, et al., 2019) failed to calculate for the following reasons:   The valid total cholesterol range is 130 to 320 mg/dL    Assessment & Plan:   Problem List Items Addressed This Visit       Cardiovascular and Mediastinum   Abdominal aortic atherosclerosis (HCC)  Relevant Medications   amLODipine-benazepril (LOTREL) 5-40 MG capsule   sildenafil (VIAGRA) 100 MG tablet     Respiratory   Obstructive sleep apnea hypopnea, severe    I discussed that I think a lot of his fatigue comes from his untreated obstructive sleep apnea.  His initial sleep study was in 2010 with an AHI of 80 2 repeat sleep study was in 2018 where he used 14 mm positive airway pressure.  He was requesting a new study however his weight has not really changed over this time I discussed with him due to advances in the CPAP therapy that there is the ability to auto titrate which I would recommend for him.      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       Relevant Medications   amLODipine-benazepril (LOTREL) 5-40 MG capsule   Other Relevant Orders   POC Hbg A1C (Completed)     Genitourinary   Benign localized prostatic hyperplasia with lower urinary tract symptoms (LUTS) (Chronic)    Glad symptoms improved with Flomax      Relevant Medications   tamsulosin (FLOMAX) 0.4 MG CAPS capsule     Other   Major depression, recurrent, chronic (HCC)    This is improved I am going to convert his Wellbutrin 150 twice daily to 300 mg daily.  We will work on treating sleep apnea and see if fatigue improves.      Relevant Medications   buPROPion (WELLBUTRIN XL) 300 MG 24 hr tablet   Other Visit Diagnoses     Benign prostatic hyperplasia with incomplete bladder emptying       Relevant Medications   tamsulosin (FLOMAX) 0.4 MG CAPS capsule       Return in about 3 months (around 05/17/2022).    Gust Rung, DO

## 2022-02-15 ENCOUNTER — Ambulatory Visit: Payer: BC Managed Care – PPO | Admitting: Podiatry

## 2022-02-18 NOTE — Assessment & Plan Note (Signed)
Glad symptoms improved with Flomax

## 2022-02-18 NOTE — Assessment & Plan Note (Signed)
Glycemic control slightly improved we are making a slight increase in his basal rate from 1.3 to 1.35 units/h.

## 2022-02-18 NOTE — Assessment & Plan Note (Signed)
I discussed that I think a lot of his fatigue comes from his untreated obstructive sleep apnea.  His initial sleep study was in 2010 with an AHI of 80 2 repeat sleep study was in 2018 where he used 14 mm positive airway pressure.  He was requesting a new study however his weight has not really changed over this time I discussed with him due to advances in the CPAP therapy that there is the ability to auto titrate which I would recommend for him.

## 2022-02-18 NOTE — Assessment & Plan Note (Signed)
This is improved I am going to convert his Wellbutrin 150 twice daily to 300 mg daily.  We will work on treating sleep apnea and see if fatigue improves.

## 2022-03-21 ENCOUNTER — Ambulatory Visit: Payer: BC Managed Care – PPO | Admitting: Podiatry

## 2022-04-02 ENCOUNTER — Ambulatory Visit: Payer: Self-pay | Admitting: Licensed Clinical Social Worker

## 2022-04-02 NOTE — Patient Outreach (Signed)
  Care Coordination   04/02/2022 Name: Tyler Wilkinson MRN: [id number] DOB: 1956-06-14   Care Coordination Outreach Attempts:  An unsuccessful telephone outreach was attempted today to offer the patient information about available care coordination services as a benefit of their health plan.   Follow Up Plan:  Additional outreach attempts will be made to offer the patient care coordination information and services.   Encounter Outcome:  No Answer  Care Coordination Interventions Activated:  No   Care Coordination Interventions:  No, not indicated    Ander Gaster, MSW  Social Worker [facility]  [phone]

## 2022-04-08 ENCOUNTER — Telehealth: Payer: Self-pay

## 2022-04-08 NOTE — Telephone Encounter (Signed)
Pt is requesting a call back ... He stated that his PCP have ordered him a cpap  machine  .Marland Kitchen But there is a catch to it his insurance  window is 9 days   from from today he stated he has meant his deductible

## 2022-04-09 NOTE — Telephone Encounter (Signed)
Called pt - no answer; left message on vm.I will ask Alba Cory to f/u.

## 2022-04-10 ENCOUNTER — Encounter: Payer: Self-pay | Admitting: Podiatry

## 2022-04-26 ENCOUNTER — Ambulatory Visit: Payer: BC Managed Care – PPO | Admitting: Podiatry

## 2022-05-09 ENCOUNTER — Other Ambulatory Visit: Payer: Self-pay

## 2022-05-09 ENCOUNTER — Ambulatory Visit (INDEPENDENT_AMBULATORY_CARE_PROVIDER_SITE_OTHER): Payer: BC Managed Care – PPO | Admitting: Internal Medicine

## 2022-05-09 ENCOUNTER — Encounter: Payer: Self-pay | Admitting: Internal Medicine

## 2022-05-09 VITALS — BP 134/70 | HR 84 | Temp 97.8°F | Ht 72.0 in | Wt 222.1 lb

## 2022-05-09 DIAGNOSIS — Z87891 Personal history of nicotine dependence: Secondary | ICD-10-CM

## 2022-05-09 DIAGNOSIS — Z794 Long term (current) use of insulin: Secondary | ICD-10-CM

## 2022-05-09 DIAGNOSIS — F339 Major depressive disorder, recurrent, unspecified: Secondary | ICD-10-CM

## 2022-05-09 DIAGNOSIS — K3184 Gastroparesis: Secondary | ICD-10-CM

## 2022-05-09 DIAGNOSIS — E1143 Type 2 diabetes mellitus with diabetic autonomic (poly)neuropathy: Secondary | ICD-10-CM | POA: Diagnosis not present

## 2022-05-09 DIAGNOSIS — R0989 Other specified symptoms and signs involving the circulatory and respiratory systems: Secondary | ICD-10-CM

## 2022-05-09 DIAGNOSIS — Z23 Encounter for immunization: Secondary | ICD-10-CM | POA: Diagnosis not present

## 2022-05-09 DIAGNOSIS — N401 Enlarged prostate with lower urinary tract symptoms: Secondary | ICD-10-CM

## 2022-05-09 DIAGNOSIS — R3914 Feeling of incomplete bladder emptying: Secondary | ICD-10-CM

## 2022-05-09 DIAGNOSIS — I10 Essential (primary) hypertension: Secondary | ICD-10-CM

## 2022-05-09 DIAGNOSIS — Z0000 Encounter for general adult medical examination without abnormal findings: Secondary | ICD-10-CM

## 2022-05-09 LAB — GLUCOSE, CAPILLARY: Glucose-Capillary: 210 mg/dL — ABNORMAL HIGH (ref 70–99)

## 2022-05-09 LAB — POCT GLYCOSYLATED HEMOGLOBIN (HGB A1C): Hemoglobin A1C: 7.8 % — AB (ref 4.0–5.6)

## 2022-05-09 MED ORDER — TAMSULOSIN HCL 0.4 MG PO CAPS: 0.4000 mg | ORAL_CAPSULE | Freq: Every day | ORAL | 1 refills | Status: DC

## 2022-05-09 NOTE — Progress Notes (Signed)
 Established Patient Office Visit  Subjective   Patient ID: Tyler Wilkinson, male    DOB: 06/21/1956  Age: 66 y.o. MRN: [id number]  Chief Complaint  Patient presents with   Follow-up   Abdominal discomfort   Discuss medication   [name] presents for diabetes follow-up as well as reporting some abdominal discomfort.  For his diabetes he has still been using the OmniPod and Dexcom G6.  We downloaded his CGM data he is still experiencing some hyperand hypoglycemia.  It looks like he is not responding to hypoglycemia alerts in a timely fashion.  Also in reviewing with him he is still placing some an accurate CBG data into the OmniPod which is making his glycemic control more difficult.  He will meet with Donna for additional diabetic education after this visit.  His abdominal discomfort he notes that sometimes after eating certain meals notes it to be crampy and bloating.  He also notes that he is probably due for colonoscopy (he is).    Finally he tells me that he stopped taking the extended release Wellbutrin and he went back to taking 150 mg of the instant release.  He thinks he might go back to the extended release but he is unsure he wants to know if this medication was causing his abdominal complaints.     Objective:     BP 134/70 (BP Location: Right Arm, Patient Position: Sitting, Cuff Size: Normal)   Pulse 84   Temp 97.8 F (36.6 C) (Oral)   Ht 6' (1.829 m)   Wt 222 lb 1.6 oz (100.7 kg)   SpO2 99% Comment: RA  BMI 30.12 kg/m  BP Readings from Last 3 Encounters:  05/09/22 134/70  02/14/22 (!) 156/87  11/29/21 139/71   Wt Readings from Last 3 Encounters:  05/09/22 222 lb 1.6 oz (100.7 kg)  02/14/22 228 lb 1.6 oz (103.5 kg)  11/29/21 223 lb (101.2 kg)      Physical Exam Vitals and nursing note reviewed.  Constitutional:      Appearance: Normal appearance. He is obese.  Cardiovascular:     Rate and Rhythm: Normal rate and regular rhythm.     Pulses:          Dorsalis  pedis pulses are 1+ on the right side and 1+ on the left side.       Posterior tibial pulses are 1+ on the right side and 1+ on the left side.     Heart sounds: Normal heart sounds.  Pulmonary:     Effort: Pulmonary effort is normal.     Breath sounds: Normal breath sounds.  Abdominal:     General: Abdomen is flat. Bowel sounds are normal. There is no distension.     Palpations: Abdomen is soft.     Tenderness: There is no abdominal tenderness. There is no guarding or rebound.  Skin:    Comments: Decreased hair growth to lower legs  Neurological:     Mental Status: He is alert.  Psychiatric:        Mood and Affect: Mood normal.      Results for orders placed or performed in visit on 05/09/22  BMP8+Anion Gap  Result Value Ref Range   Glucose WILL FOLLOW    BUN WILL FOLLOW    Creatinine, Ser WILL FOLLOW    eGFR WILL FOLLOW    BUN/Creatinine Ratio WILL FOLLOW    Sodium WILL FOLLOW    Potassium WILL FOLLOW    Chloride WILL FOLLOW      CO2 WILL FOLLOW    Anion Gap WILL FOLLOW    Calcium WILL FOLLOW   Lipid Profile  Result Value Ref Range   Cholesterol, Total WILL FOLLOW    Triglycerides WILL FOLLOW    HDL WILL FOLLOW    VLDL Cholesterol Cal WILL FOLLOW    LDL Chol Calc (NIH) WILL FOLLOW    Lipid Comment: WILL FOLLOW    Chol/HDL Ratio WILL FOLLOW   CBC no Diff  Result Value Ref Range   WBC 6.6 3.4 - 10.8 x10E3/uL   RBC 5.20 4.14 - 5.80 x10E6/uL   Hemoglobin 15.1 13.0 - 17.7 g/dL   Hematocrit 44.0 37.5 - 51.0 %   MCV 85 79 - 97 fL   MCH 29.0 26.6 - 33.0 pg   MCHC 34.3 31.5 - 35.7 g/dL   RDW 13.2 11.6 - 15.4 %   Platelets 203 150 - 450 x10E3/uL  Glucose, capillary  Result Value Ref Range   Glucose-Capillary 210 (H) 70 - 99 mg/dL  POC Hbg A1C  Result Value Ref Range   Hemoglobin A1C 7.8 (A) 4.0 - 5.6 %   HbA1c POC (<> result, manual entry)     HbA1c, POC (prediabetic range)     HbA1c, POC (controlled diabetic range)      Last CBC Lab Results  Component Value  Date   WBC 6.6 05/09/2022   HGB 15.1 05/09/2022   HCT 44.0 05/09/2022   MCV 85 05/09/2022   MCH 29.0 05/09/2022   RDW 13.2 05/09/2022   PLT 203 05/09/2022   Last hemoglobin A1c Lab Results  Component Value Date   HGBA1C 7.8 (A) 05/09/2022      The ASCVD Risk score (Arnett DK, et al., 2019) failed to calculate for the following reasons:   The valid HDL cholesterol range is 0.517 to 2.586 mmol/L   The valid total cholesterol range is 3.362 to 8.275 mmol/L    Assessment & Plan:   Problem List Items Addressed This Visit       Cardiovascular and Mediastinum   Essential hypertension (Chronic)    Blood pressure is actually well controlled today.        Digestive   Diabetic gastroparesis (HCC) (Chronic)    His abdominal complaints may be due to diabetic gastroparesis he is also on a GLP-1 which may slow this down further however does not seem like he is having much in the way of nausea or vomiting.  I also discussed this may be due to abdominal gas and introduced the idea of a low FODMAP diet he will give this some consideration and at least try to see what foods may be causing him the most problem.        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  I wonder if the OmniPod 5 may be better but he would still have to stop but in an incorrect data.      Relevant Orders   POC Hbg A1C (Completed)   BMP8+Anion Gap (Completed)   Lipid Profile (Completed)   CBC no Diff (Completed)   Microalbumin / Creatinine Urine Ratio     Other   Major depression, recurrent, chronic (HCC)      Depression does not seem to be too much of an issue here we will have to see what he does think 300 mg of Wellbutrin  versus 150 is not a major difference if his depression is well controlled.      Healthcare maintenance    Referral placed for screening colonoscopy.      Relevant Orders   Ambulatory referral to Gastroenterology   Pneumococcal conjugate vaccine 20-valent (Prevnar 20) (Completed)   Other Visit Diagnoses     Benign prostatic hyperplasia with incomplete bladder emptying       Relevant Medications   tamsulosin (FLOMAX) 0.4 MG CAPS capsule   Decreased pedal pulses       Relevant Orders   POCT ABI Screening Pilot No Charge       Return in about 3 months (around 08/08/2022).    Erik C Hoffman, DO  

## 2022-05-10 LAB — BMP8+ANION GAP
Anion Gap: 13 mmol/L (ref 10.0–18.0)
BUN/Creatinine Ratio: 13 (ref 10–24)
BUN: 13 mg/dL (ref 8–27)
CO2: 23 mmol/L (ref 20–29)
Calcium: 9.5 mg/dL (ref 8.6–10.2)
Chloride: 103 mmol/L (ref 96–106)
Creatinine, Ser: 1.02 mg/dL (ref 0.76–1.27)
Glucose: 132 mg/dL — ABNORMAL HIGH (ref 70–99)
Potassium: 3.8 mmol/L (ref 3.5–5.2)
Sodium: 139 mmol/L (ref 134–144)
eGFR: 81 mL/min/{1.73_m2} (ref >59)

## 2022-05-10 LAB — MICROALBUMIN / CREATININE URINE RATIO
Creatinine, Urine: 129.4 mg/dL
Microalb/Creat Ratio: 9 mg/g creat (ref 0–29)
Microalbumin, Urine: 11.4 ug/mL

## 2022-05-10 LAB — CBC
Hematocrit: 44 % (ref 37.5–51.0)
Hemoglobin: 15.1 g/dL (ref 13.0–17.7)
MCH: 29 pg (ref 26.6–33.0)
MCHC: 34.3 g/dL (ref 31.5–35.7)
MCV: 85 fL (ref 79–97)
Platelets: 203 10*3/uL (ref 150–450)
RBC: 5.2 x10E6/uL (ref 4.14–5.80)
RDW: 13.2 % (ref 11.6–15.4)
WBC: 6.6 10*3/uL (ref 3.4–10.8)

## 2022-05-10 LAB — LIPID PANEL
Chol/HDL Ratio: 3.9 ratio (ref 0.0–5.0)
Cholesterol, Total: 147 mg/dL (ref 100–199)
HDL: 38 mg/dL — ABNORMAL LOW (ref >39)
LDL Chol Calc (NIH): 92 mg/dL (ref 0–99)
Triglycerides: 92 mg/dL (ref 0–149)
VLDL Cholesterol Cal: 17 mg/dL (ref 5–40)

## 2022-05-10 NOTE — Assessment & Plan Note (Signed)
Blood pressure is actually well controlled today.

## 2022-05-10 NOTE — Assessment & Plan Note (Signed)
His abdominal complaints may be due to diabetic gastroparesis he is also on a GLP-1 which may slow this down further however does not seem like he is having much in the way of nausea or vomiting.  I also discussed this may be due to abdominal gas and introduced the idea of a low FODMAP diet he will give this some consideration and at least try to see what foods may be causing him the most problem.

## 2022-05-10 NOTE — Assessment & Plan Note (Signed)
We will continue to work with him for diabetic education make sure he is injecting his insulin at a timely manner and not using the correction factor and inputting CBG data to inject what he thinks is the right amount of insulin.  I wonder if the OmniPod 5 may be better but he would still have to stop but in an incorrect data.

## 2022-05-10 NOTE — Assessment & Plan Note (Signed)
Depression does not seem to be too much of an issue here we will have to see what he does think 300 mg of Wellbutrin versus 150 is not a major difference if his depression is well controlled.

## 2022-05-10 NOTE — Assessment & Plan Note (Signed)
Referral placed for screening colonoscopy.

## 2022-06-21 ENCOUNTER — Telehealth: Payer: Self-pay

## 2022-06-21 NOTE — Patient Outreach (Signed)
  Care Coordination   06/21/2022 Name: Tyler Wilkinson MRN: [id number] DOB: 02-05-1956   Care Coordination Outreach Attempts:  An unsuccessful telephone outreach was attempted today to offer the patient information about available care coordination services as a benefit of their health plan.   Follow Up Plan:  Additional outreach attempts will be made to offer the patient care coordination information and services.   Encounter Outcome:  No Answer  Care Coordination Interventions Activated:  No   Care Coordination Interventions:  No, not indicated    Jone Baseman, RN, MSN [facility] Care Management Care Management Coordinator Direct Line [phone]

## 2022-06-25 DIAGNOSIS — H1045 Other chronic allergic conjunctivitis: Secondary | ICD-10-CM | POA: Diagnosis not present

## 2022-06-25 DIAGNOSIS — H04123 Dry eye syndrome of bilateral lacrimal glands: Secondary | ICD-10-CM | POA: Diagnosis not present

## 2022-06-25 DIAGNOSIS — H40053 Ocular hypertension, bilateral: Secondary | ICD-10-CM | POA: Diagnosis not present

## 2022-06-25 DIAGNOSIS — E119 Type 2 diabetes mellitus without complications: Secondary | ICD-10-CM | POA: Diagnosis not present

## 2022-06-25 LAB — HM DIABETES EYE EXAM

## 2022-07-16 ENCOUNTER — Encounter: Payer: Self-pay | Admitting: Dietician

## 2022-07-17 ENCOUNTER — Telehealth: Payer: Self-pay

## 2022-07-17 NOTE — Patient Outreach (Signed)
  Care Coordination   07/17/2022 Name: Tyler Wilkinson MRN: [id number] DOB: 1956/04/15   Care Coordination Outreach Attempts:  A third unsuccessful outreach was attempted today to offer the patient with information about available care coordination services as a benefit of their health plan.   Follow Up Plan:  No further outreach attempts will be made at this time. We have been unable to contact the patient to offer or enroll patient in care coordination services  Encounter Outcome:  No Answer   Care Coordination Interventions:  No, not indicated    Bary Leriche, RN, MSN [facility] Care Management Care Management Coordinator Direct Line [phone]

## 2022-07-23 ENCOUNTER — Emergency Department (HOSPITAL_BASED_OUTPATIENT_CLINIC_OR_DEPARTMENT_OTHER)
Admission: EM | Admit: 2022-07-23 | Discharge: 2022-07-24 | Disposition: A | Discharge status: Home / Self Care | Payer: BC Managed Care – PPO | Attending: Emergency Medicine | Admitting: Emergency Medicine

## 2022-07-23 ENCOUNTER — Other Ambulatory Visit: Payer: Self-pay

## 2022-07-23 ENCOUNTER — Encounter (HOSPITAL_BASED_OUTPATIENT_CLINIC_OR_DEPARTMENT_OTHER): Payer: Self-pay | Admitting: Emergency Medicine

## 2022-07-23 DIAGNOSIS — I10 Essential (primary) hypertension: Secondary | ICD-10-CM | POA: Diagnosis not present

## 2022-07-23 DIAGNOSIS — Z79899 Other long term (current) drug therapy: Secondary | ICD-10-CM | POA: Insufficient documentation

## 2022-07-23 DIAGNOSIS — J45909 Unspecified asthma, uncomplicated: Secondary | ICD-10-CM | POA: Insufficient documentation

## 2022-07-23 DIAGNOSIS — E119 Type 2 diabetes mellitus without complications: Secondary | ICD-10-CM | POA: Diagnosis not present

## 2022-07-23 DIAGNOSIS — Z7982 Long term (current) use of aspirin: Secondary | ICD-10-CM | POA: Diagnosis not present

## 2022-07-23 DIAGNOSIS — L299 Pruritus, unspecified: Secondary | ICD-10-CM | POA: Insufficient documentation

## 2022-07-23 DIAGNOSIS — I2510 Atherosclerotic heart disease of native coronary artery without angina pectoris: Secondary | ICD-10-CM | POA: Insufficient documentation

## 2022-07-23 DIAGNOSIS — Z794 Long term (current) use of insulin: Secondary | ICD-10-CM | POA: Insufficient documentation

## 2022-07-23 NOTE — ED Triage Notes (Signed)
Pt via pov from home with rash on his neck and back and legs x 1 week. Pt states he itches all over and is trying to use lotions and creams. He has switched laundry detergent to one that is free of dyes and fragrances. Pt alert & oriented, nad noted.

## 2022-07-24 NOTE — ED Provider Notes (Signed)
[facility] EMERGENCY DEPT Provider Note   CSN: [id number] Arrival date & time: 07/23/22  2101     History  Chief Complaint  Patient presents with   Rash    Tyler Wilkinson is a 66 y.o. male.  HPI     This is a 66 year old male who presents with itching.  Patient reports that he has had whole body itching over the last 1 week.  He has not noted any actual rash but states that he has developed some rash on his legs after scratching.  He did recently change soaps to suggest.  Otherwise he states that he actually switched to a detergent that was free of fragrances and dyes.  No one else in his house has any symptoms.  He states he is checked for bedbugs.  Denies any recent illnesses.  He has not used anything or taken Benadryl for his symptoms.  He denies systemic symptoms.  Of note, patient has noted over the last several weeks that he has had marks left by his Dexcom on his skin which is new for him.  Home Medications Prior to Admission medications   Medication Sig Start Date End Date Taking? Authorizing Provider  albuterol (VENTOLIN HFA) 108 (90 Base) MCG/ACT inhaler Inhale 2 puffs into the lungs every 6 (six) hours as needed for wheezing or shortness of breath. 02/20/21   Lucious Groves, DO  amLODipine-benazepril (LOTREL) 5-40 MG capsule Take 1 capsule by mouth daily. 02/14/22   Lucious Groves, DO  Arginine 500 MG CAPS Take 500 mg by mouth daily.    [provider]  aspirin EC 81 MG tablet Take 1 tablet (81 mg total) by mouth daily. 08/05/14   Burns, Arloa Koh, MD  Blood Glucose Monitoring Suppl (BAYER CONTOUR NEXT USB MONITOR) w/Device KIT Check blood sugar up to 6 times a day 05/08/16   Lucious Groves, DO  buPROPion (WELLBUTRIN XL) 300 MG 24 hr tablet Take 1 tablet (300 mg total) by mouth daily. 02/14/22 02/09/23  Lucious Groves, DO  Continuous Blood Gluc Receiver (DEXCOM G6 RECEIVER) DEVI 1 each by Does not apply route 5 (five) times daily. 05/14/19   Lucious Groves, DO  Continuous Blood Gluc Sensor (DEXCOM G6 SENSOR) MISC 1 each by Does not apply route as directed. 12/31/21   Lucious Groves, DO  Continuous Blood Gluc Transmit (DEXCOM G6 TRANSMITTER) MISC 1 each by Does not apply route 5 (five) times daily. 08/27/21   Lucious Groves, DO  COSOPT PF 22.3-6.8 MG/ML SOLN ophthalmic solution Place 1 drop into both eyes 2 (two) times daily.  11/23/16   [provider]  Diclofenac Sodium (PENNSAID) 2 % SOLN Apply 2 g topically 2 (two) times daily as needed (to affected area). 12/15/19   Leandrew Koyanagi, MD  Dulaglutide (TRULICITY) 1.5 WU/9.8JX SOPN Inject 1.5 mg into the skin once a week. 08/24/21   Lucious Groves, DO  Flaxseed, Linseed, (FLAX SEED OIL PO) Take 1 tablet by mouth daily.     [provider]  fluticasone (FLONASE) 50 MCG/ACT nasal spray Place 2 sprays into both nostrils daily. 05/21/17 06/04/17  Velna Ochs, MD  fluticasone (FLONASE) 50 MCG/ACT nasal spray Place 1 spray into both nostrils daily.    [provider]  glucose blood (BAYER CONTOUR NEXT TEST) test strip Check blood sugar up to 6 times a day 02/20/21   Lucious Groves, DO  hydrochlorothiazide (HYDRODIURIL) 25 MG tablet Take 1 tablet (25  mg total) by mouth daily. 05/11/21   Lucious Groves, DO  insulin aspart (NOVOLOG) 100 UNIT/ML injection Use to fill omnipod insulin pump up to 90 units a day. 10/12/21   Lucious Groves, DO  Insulin Disposable Pump (OMNIPOD DASH PODS, GEN 4,) MISC Fill with Novolog insulin and apply new pod about every 2 days 11/19/21   Lucious Groves, DO  Insulin Syringe-Needle U-100 (INSULIN SYRINGE .5CC/31GX5/16") 31G X 5/16" 0.5 ML MISC Use to inject insulin 3 times a day Dx code 250.00 02/18/11   Trish Fountain, MD  latanoprost (XALATAN) 0.005 % ophthalmic solution Place 1 drop into both eyes at bedtime. 06/01/11   [provider]  levocetirizine (XYZAL) 5 MG tablet Take 1 tablet (5 mg total) by mouth every evening. 12/31/19    Lucious Groves, DO  meloxicam (MOBIC) 15 MG tablet Take 1 tablet (15 mg total) by mouth daily. 02/08/22   Lorenda Peck, [place]. Devices (PULSE OXIMETER FOR FINGER) MISC Take pulse ox level in the morning and at night. If less than or equal to 94 please call our clinic. 04/10/21   Rick Duff, MD  [facility] DELICA LANCETS 29J MISC Use to check blood sugars up to 4 times a day. Dx code:E11.65 08/08/15   Lucious Groves, DO  rosuvastatin (CRESTOR) 5 MG tablet Take 1 tablet (5 mg total) by mouth daily. 05/28/21 05/23/22  Lucious Groves, DO  sildenafil (VIAGRA) 100 MG tablet Take 1 tablet (100 mg total) by mouth as needed for erectile dysfunction. 02/14/22 02/14/23  Lucious Groves, DO  tamsulosin (FLOMAX) 0.4 MG CAPS capsule Take 1 capsule (0.4 mg total) by mouth daily after breakfast. 05/09/22   Lucious Groves, DO  triamcinolone (NASACORT AQ) 55 MCG/ACT AERO nasal inhaler Place 1 spray into the nose 2 (two) times daily. 12/27/15   Riccardo Dubin, MD      Allergies    Patient has no known allergies.    Review of Systems   Review of Systems  Constitutional:  Negative for fever.  Respiratory:  Negative for shortness of breath.   Cardiovascular:  Negative for chest pain.  Skin:        Itching  All other systems reviewed and are negative.   Physical Exam Updated Vital Signs BP (!) 150/81 (BP Location: Right Arm)   Pulse 79   Temp 98.4 F (36.9 C) (Oral)   Resp 18   Ht 1.829 m (6')   Wt 100.7 kg   SpO2 100%   BMI 30.11 kg/m  Physical Exam Vitals and nursing note reviewed.  Constitutional:      Appearance: He is well-developed. He is obese. He is not ill-appearing.  HENT:     Head: Normocephalic and atraumatic.  Eyes:     Pupils: Pupils are equal, round, and reactive to light.  Cardiovascular:     Rate and Rhythm: Normal rate and regular rhythm.  Pulmonary:     Effort: Pulmonary effort is normal. No respiratory distress.  Abdominal:     Palpations: Abdomen is soft.      Tenderness: There is no abdominal tenderness.     Comments: Dexcom noted  Musculoskeletal:     Cervical back: Neck supple.  Lymphadenopathy:     Cervical: No cervical adenopathy.  Skin:    General: Skin is warm and dry.     Comments: Excoriations noted bilateral shins, otherwise no rash  Neurological:     Mental Status: He is alert and  oriented to person, place, and time.  Psychiatric:        Mood and Affect: Mood normal.     ED Results / Procedures / Treatments   Labs (all labs ordered are listed, but only abnormal results are displayed) Labs Reviewed - No data to display  EKG None  Radiology No results found.  Procedures Procedures    Medications Ordered in ED Medications - No data to display  ED Course/ Medical Decision Making/ A&P                           Medical Decision Making  This patient presents to the ED for concern of itching, this involves an extensive number of treatment options, and is a complaint that carries with it a high risk of complications and morbidity.  I considered the following differential and admission for this acute, potentially life threatening condition.  The differential diagnosis includes general pruritus, contact dermatitis, infestation such as bedbugs or scabies  MDM:    This is a 66 year old male who presents with itching.  No notable rash on exam.  He does have some excoriation over the bilateral shins which she states happened after scratching.  He did recently change soaps.  He has no obvious contact dermatitis rash although irritant is high on my list.  I do not see any obvious signs of scabies or bites consistent with bedbugs.  Recommend that he avoid any soaps or detergents with dyes or fragrances.  He can take Benadryl as needed for itching.  Do not feel that he warrants any further work-up at this time.  He will follow-up with his primary physician if not improving.  He is not jaundiced and I have low suspicion for liver disease  as primary cause of pruritus  (Labs, imaging, consults)  Labs: I Ordered, and personally interpreted labs.  The pertinent results include: None  Imaging Studies ordered: I ordered imaging studies including none I independently visualized and interpreted imaging. I agree with the radiologist interpretation  Additional history obtained from chart review.  External records from outside source obtained and reviewed including prior evaluations  Cardiac Monitoring: The patient was maintained on a cardiac monitor.  I personally viewed and interpreted the cardiac monitored which showed an underlying rhythm of: Sinus rhythm  Reevaluation: After the interventions noted above, I reevaluated the patient and found that they have :stayed the same  Social Determinants of Health:  lives independently  Disposition: Discharge  Co morbidities that complicate the patient evaluation  Past Medical History:  Diagnosis Date   Asthma    CAD (coronary artery disease)    stent RCA 2006 (other residual disease). /  nuclear 2008  no ischemia, inferolateral scar.   Cataract    Chronic bronchitis ([place])    "anytime I get sick it goes into bronchitis; probably get it q yr"   Daily headache    "just recently" (05/10/2014)   Dyslipidemia    Educated about COVID-19 virus infection 01/04/2019   Ejection fraction    EF 55%,echo, 2008, inferolateral hypokinesis,   Erectile dysfunction    Flu-like symptoms 09/30/2018   Gastroparesis    Hypertension    Myocardial infarction ([place]) 2006   Severe obstructive sleep apnea-hypopnea syndrome 04/15/2009   Sleep study 03/15/2009 AHI of 82.2/hr oxygen desaturation nadir of 77%, loud snoring. Did not tolerate CPAP due to mask.   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   I have reviewed the patients home medicines and have made adjustments as needed  Problem List / ED Course: Problem List Items Addressed This Visit   None Visit Diagnoses     Itching    -  Primary                   Final Clinical Impression(s) / ED Diagnoses Final diagnoses:  Itching    Rx / DC Orders ED Discharge Orders     None         Adelard Sanon, Barbette Hair, MD 07/24/22 0020

## 2022-07-24 NOTE — ED Notes (Signed)
Reviewed AVS/discharge instruction with patient. Time allotted for and all questions answered. Patient is agreeable for d/c and escorted to ed exit by staff.  

## 2022-07-24 NOTE — Discharge Instructions (Signed)
You were seen today for itching.  There is no obvious rash.  Given the recent change in soap, I would go back to nonscented nonperfumed soaps.  Discontinue use of the Zest.  If you feel that you are not improving with these modifications, follow-up closely with your primary doctor.  If you develop a rash, follow-up.  You may use 25 mg of Benadryl for itching.

## 2022-08-14 ENCOUNTER — Ambulatory Visit (INDEPENDENT_AMBULATORY_CARE_PROVIDER_SITE_OTHER): Payer: BC Managed Care – PPO | Admitting: Student

## 2022-08-14 DIAGNOSIS — U071 COVID-19: Secondary | ICD-10-CM | POA: Diagnosis not present

## 2022-08-14 MED ORDER — NIRMATRELVIR/RITONAVIR (PAXLOVID)TABLET: 3.0000 | ORAL_TABLET | Freq: Two times a day (BID) | ORAL | 0 refills | Status: DC

## 2022-08-14 MED ORDER — NIRMATRELVIR/RITONAVIR (PAXLOVID)TABLET: 3.0000 | ORAL_TABLET | Freq: Two times a day (BID) | ORAL | 0 refills | Status: AC

## 2022-08-14 NOTE — Progress Notes (Signed)
  [facility] Internal Medicine Residency Telephone Encounter Continuity Care Appointment  HPI:  This telephone encounter was created for [name] on 08/14/2022 for the following purpose/cc COVID.   Past Medical History:  Past Medical History:  Diagnosis Date   Asthma    CAD (coronary artery disease)    stent RCA 2006 (other residual disease). /  nuclear 2008  no ischemia, inferolateral scar.   Cataract    Chronic bronchitis (HCC)    "anytime I get sick it goes into bronchitis; probably get it q yr"   Daily headache    "just recently" (05/10/2014)   Dyslipidemia    Educated about COVID-19 virus infection 01/04/2019   Ejection fraction    EF 55%,echo, 2008, inferolateral hypokinesis,   Erectile dysfunction    Flu-like symptoms 09/30/2018   Gastroparesis    Hypertension    Myocardial infarction (HCC) 2006   Severe obstructive sleep apnea-hypopnea syndrome 04/15/2009   Sleep study 03/15/2009 AHI of 82.2/hr oxygen desaturation nadir of 77%, loud snoring. Did not tolerate CPAP due to mask.   Sprain of right ankle 07/10/2012   Tooth pain 05/23/2017   Type 2 diabetes mellitus with complications (HCC) dx'd 1972   Viral upper respiratory tract infection 05/21/2017   Vomiting 11/14/2016     ROS:  As per HPI   Assessment / Plan / Recommendations:  Please see A&P under problem oriented charting for assessment of the patient's acute and chronic medical conditions.  As always, pt is advised that if symptoms worsen or new symptoms arise, they should go to an urgent care facility or to to ER for further evaluation.   Consent and Medical Decision Making:  Patient discussed with Dr. Antony Contras This is a telephone encounter between [name] and Evlyn Kanner on 08/14/2022 for COVID. The visit was conducted with the patient located at home and Evlyn Kanner at [facility]. The patient's identity was confirmed using their DOB and current address. The patient has consented to being  evaluated through a telephone encounter and understands the associated risks (an examination cannot be done and the patient may need to come in for an appointment) / benefits (allows the patient to remain at home, decreasing exposure to coronavirus). I personally spent 7 minutes on medical discussion.    COVID-19 Tyler Wilkinson is reporting via telephone to discuss recent COVID-19 positive result. He was around a friend that tested positive last week. Reports yesterday he began experiencing sneezing, cough, chills, sore throat, and headache. He previously had COVID a couple years ago that responded well to Paxlovid therapy. He is high risk with multiple cardiovascular risk factors along with sleep apnea. Discussed with Tyler Wilkinson to stop Crestor and Viagra while on this medication, he verbalized understanding. He is vaccinated x1 for COVID back in 2021.  - Start Paxlovid - Hold Crestor, Viagra - Return precautions given  Evlyn Kanner, MD Internal Medicine PGY-3 Pager: [phone]

## 2022-08-14 NOTE — Assessment & Plan Note (Addendum)
[name] is reporting via telephone to discuss recent COVID-19 positive result. He was around a friend that tested positive last week. Reports yesterday he began experiencing sneezing, cough, chills, sore throat, and headache. He previously had COVID a couple years ago that responded well to Paxlovid therapy. He is high risk with multiple cardiovascular risk factors along with sleep apnea. Discussed with [name] to stop Crestor and Viagra while on this medication, he verbalized understanding. He is vaccinated x1 for COVID back in 2021.  - Start Paxlovid - Hold Crestor, Viagra - Return precautions given

## 2022-08-22 ENCOUNTER — Encounter: Payer: BC Managed Care – PPO | Admitting: Internal Medicine

## 2022-08-22 DIAGNOSIS — E1143 Type 2 diabetes mellitus with diabetic autonomic (poly)neuropathy: Secondary | ICD-10-CM

## 2022-08-28 NOTE — Progress Notes (Signed)
Internal Medicine Clinic Attending ? ?Case discussed with Dr. Braswell  At the time of the visit.  We reviewed the resident?s history and exam and pertinent patient test results.  I agree with the assessment, diagnosis, and plan of care documented in the resident?s note.  ?

## 2022-08-29 ENCOUNTER — Telehealth: Payer: Self-pay | Admitting: Dietician

## 2022-08-29 NOTE — Telephone Encounter (Signed)
Has new insurance, and that is [facility] may be making requests. Has not gotten any information about his CPAP, knows he needs it now and is willing to have another study.

## 2022-09-03 NOTE — Progress Notes (Addendum)
CC: Knee pain and dizziness  HPI:   [name] a 67 y.o. male with a past medical history of asthma, CAD, MI 2006, and diabetes complicated by gastroparesis who presents with dizziness and knee pain. He was last seen at [facility] in 04-2022.    Past Medical History:  Diagnosis Date   Asthma    CAD (coronary artery disease)    stent RCA 2006 (other residual disease). /  nuclear 2008  no ischemia, inferolateral scar.   Cataract    Chronic bronchitis ([place])    "anytime I get sick it goes into bronchitis; probably get it q yr"   Daily headache    "just recently" (05/10/2014)   Dyslipidemia    Educated about COVID-19 virus infection 01/04/2019   Ejection fraction    EF 55%,echo, 2008, inferolateral hypokinesis,   Erectile dysfunction    Flu-like symptoms 09/30/2018   Gastroparesis    Hypertension    Myocardial infarction ([place]) 2006   Severe obstructive sleep apnea-hypopnea syndrome 04/15/2009   Sleep study 03/15/2009 AHI of 82.2/hr oxygen desaturation nadir of 77%, loud snoring. Did not tolerate CPAP due to mask.   Sprain of right ankle 07/10/2012   Tooth pain 05/23/2017   Type 2 diabetes mellitus with complications ([place]) dx'd 1972   Viral upper respiratory tract infection 05/21/2017   Vomiting 11/14/2016     Review of Systems:    Reports dizziness, lightheadedness, nausea, tinnitus, hearing loss, acute R knee pain Denies vomiting, palpitations, chest pain, recent illnesses   Physical Exam:  Vitals:   09/05/22 1446  BP: 137/77  Pulse: 84  Temp: 98.1 F (36.7 C)  TempSrc: Oral  SpO2: 99%  Weight: 222 lb 12.8 oz (101.1 kg)  Height: 6' (1.829 m)    General:   awake and alert, sitting comfortably in chair, cooperative, not in acute distress Skin:   warm and dry, skin turgor normal  Head:   normocephalic and atraumatic, oral mucosa moist Ears:   tympanic membrane clear and round without obstruction, Dix-Hallpike negative Cardiac:   regular rate and rhythm, normal S1  and S2, capillary refill <1 second, peripheral pulses intact Musculoskeletal:   antalgic gait with favoring of left side, significant diffuse swelling of R knee without any erythema, tenderness to palpation along medial joint line, anterior and posterior drawer tests negative, varus and valgus stability intact, Thessaly test negative, McMurray test positive  Neurologic:   oriented to person-place-time, moving all extremities, no gross focal deficits Psychiatric:   euthymic mood with congruent affect, intelligible speech    Assessment & Plan:   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  Reports that his sugars will sometimes fall to 50 or 60, and this causes him to feel dizzy and lightheaded.  Diet is also inconsistent, as he will often go long periods without eating and will then consume foods with very high glycemic index such as soda. His A1C today in clinic was 9.6, which is increased from prior reading of 7.8 in 04-2022. He would likely benefit from diabetes counseling for strategies on how to achieve better glycemic control.  - Referral to diabetes educator    Knee pain, right Patient reports right knee pain that started suddenly three days ago during movement. At that time, he heard an audible pop. Denies catching, locking, or swelling. Since this event, the pain has improved slightly.  Physical examination revealed significant diffuse swelling of the right knee without any erythema. Tenderness was most pronounced along the medial joint line. Gait was antalgic with favoring of the left side. Anterior and posterior drawer tests both negative. Varus and valgus stability intact. Thessaly test negative. McMurray's positive on right. These findings are most consistent with a medial meniscal tear.   - Manage conservatively with  rest-ice-compression-elevation protocol - Oral antiinflammatory medications for symptom relief - Increase activity as tolerated - Work exception form    Dizziness Patient reports increased dizziness that started a few weeks ago. He will often feel dizzy with episodes lasting several minutes multiple times throughout the day. Associated symptoms include spinning sensation, nausea, lightheadedness, tinnitus, and hearing loss. The hearing loss, specifically, has been a more chronic issue for him and precedes the dizziness. Denies vomiting, palpitations, and chest pain. The patient has noticed a correlation with positional changes and low blood sugar levels captured on glucometer. He also states that he does not drink much throughout the day. Examination was notable for mild orthostatic changes in BP with minimal compensation in HR. Dix-Hallpike maneuver reproduced the dizziness sensation, but was negative for nystagmus. Skin turgor was normal, mucus membranes moist, and capillary refill less than one second. History is notable for diabetes complicated by gastroparesis. Medications include tamsulosin, amlodipine-benazepril, and hydrochlorothiazide. Etiology of these dizziness episodes remains unclear, though is likely multifactorial. Contributing factors may include diabetic autonomic neuropathy, peripheral vertigo, intra-auricular pathology, hypoglycemia due to poor glycemic control, and hypotension secondary to dehydration. Identifying the underlying cause will require systematically ruling out potential causes.  - Discontinue hydrochlorothiazide and return to clinic in one week for reassessment - Encourage utilization of glucose monitor to achieve tighter glycemic control while avoiding hypoglycemia      See Encounters Tab for problem based charting.  Patient seen with Dr. Jimmye Norman

## 2022-09-05 ENCOUNTER — Encounter: Payer: Self-pay | Admitting: Student

## 2022-09-05 ENCOUNTER — Ambulatory Visit (INDEPENDENT_AMBULATORY_CARE_PROVIDER_SITE_OTHER): Payer: BC Managed Care – PPO | Admitting: Student

## 2022-09-05 ENCOUNTER — Other Ambulatory Visit: Payer: Self-pay

## 2022-09-05 VITALS — BP 137/77 | HR 84 | Temp 98.1°F | Ht 72.0 in | Wt 222.8 lb

## 2022-09-05 DIAGNOSIS — M25561 Pain in right knee: Secondary | ICD-10-CM | POA: Diagnosis not present

## 2022-09-05 DIAGNOSIS — Z794 Long term (current) use of insulin: Secondary | ICD-10-CM

## 2022-09-05 DIAGNOSIS — Z9641 Presence of insulin pump (external) (internal): Secondary | ICD-10-CM

## 2022-09-05 DIAGNOSIS — E1143 Type 2 diabetes mellitus with diabetic autonomic (poly)neuropathy: Secondary | ICD-10-CM

## 2022-09-05 DIAGNOSIS — R42 Dizziness and giddiness: Secondary | ICD-10-CM | POA: Insufficient documentation

## 2022-09-05 LAB — POCT GLYCOSYLATED HEMOGLOBIN (HGB A1C): Hemoglobin A1C: 9.6 % — AB (ref 4.0–5.6)

## 2022-09-05 LAB — GLUCOSE, CAPILLARY: Glucose-Capillary: 272 mg/dL — ABNORMAL HIGH (ref 70–99)

## 2022-09-05 NOTE — Patient Instructions (Signed)
  Thank you, [name], for allowing [place] to provide your care today. Today we discussed . . .  > Knee Pain       - you have injured your knee and the best treatment for this is conservative with compression, elevation, and ice       - you may take antiinflammatory medications as needed for pain relief > Dizziness       - we are not completely sure what is causing your dizziness, there are likely several contributing factors       - possible causes include dehydration, autonomic instability from diabetes, and vertigo       - please discontinue taking hydrochlorothiazide and return to our clinic in one week > Diabetes       - your A1C was very high today       - please limit your intake of soda and continue to monitor your glucose levels   I have ordered the following labs for you:   Lab Orders         Glucose, capillary         POC Hbg A1C       Tests ordered today:  none   Referrals ordered today:   Referral Orders  No referral(s) requested today      I have ordered the following medication/changed the following medications:   Stop the following medications: There are no discontinued medications.   Start the following medications: No orders of the defined types were placed in this encounter.     Follow up:  1 week     Remember:  Please treat your knee pain with compression, elevation, and ice therapy. We will see you again in about one week.   Should you have any questions or concerns please call the internal medicine clinic at [phone].     Roswell Nickel, MD [place]

## 2022-09-05 NOTE — Assessment & Plan Note (Signed)
Patient has long history of diabetes patient with insulin injections administered via OmniPod and continuous glucose monitoring via Dexcom G6.  Reports that his sugars will sometimes fall to 50 or 60, and this causes him to feel dizzy and lightheaded.  Diet is also inconsistent, as he will often go long periods without eating and will then consume foods with very high glycemic index such as soda. His A1C today in clinic was 9.6, which is increased from prior reading of 7.8 in 04-2022. He would likely benefit from diabetes counseling for strategies on how to achieve better glycemic control.  - Referral to diabetes educator

## 2022-09-05 NOTE — Assessment & Plan Note (Addendum)
Patient reports right knee pain that started suddenly three days ago during movement. At that time, he heard an audible pop. Denies catching, locking, or swelling. Since this event, the pain has improved slightly. Physical examination revealed significant diffuse swelling of the right knee without any erythema. Tenderness was most pronounced along the medial joint line. Gait was antalgic with favoring of the left side. Anterior and posterior drawer tests both negative. Varus and valgus stability intact. Thessaly test negative. McMurray's positive on right. These findings are most consistent with a medial meniscal tear.   - Manage conservatively with rest-ice-compression-elevation protocol - Oral antiinflammatory medications for symptom relief - Increase activity as tolerated - Work exception form

## 2022-09-05 NOTE — Assessment & Plan Note (Addendum)
Patient reports increased dizziness that started a few weeks ago. He will often feel dizzy with episodes lasting several minutes multiple times throughout the day. Associated symptoms include spinning sensation, nausea, lightheadedness, tinnitus, and hearing loss. The hearing loss, specifically, has been a more chronic issue for him and precedes the dizziness. Denies vomiting, palpitations, and chest pain. The patient has noticed a correlation with positional changes and low blood sugar levels captured on glucometer. He also states that he does not drink much throughout the day. Examination was notable for mild orthostatic changes in BP with minimal compensation in HR. Dix-Hallpike maneuver reproduced the dizziness sensation, but was negative for nystagmus. Skin turgor was normal, mucus membranes moist, and capillary refill less than one second. History is notable for diabetes complicated by gastroparesis. Medications include tamsulosin, amlodipine-benazepril, and hydrochlorothiazide. Etiology of these dizziness episodes remains unclear, though is likely multifactorial. Contributing factors may include diabetic autonomic neuropathy, peripheral vertigo, intra-auricular pathology, hypoglycemia due to poor glycemic control, and hypotension secondary to dehydration. Identifying the underlying cause will require systematically ruling out potential causes.  - Discontinue hydrochlorothiazide and return to clinic in one week for reassessment - Encourage utilization of glucose monitor to achieve tighter glycemic control while avoiding hypoglycemia

## 2022-09-17 NOTE — Progress Notes (Signed)
Internal Medicine Clinic Attending  I saw and evaluated the patient.  I personally confirmed the key portions of the history and exam documented by Dr. Jodi Mourning and I reviewed pertinent patient test results.  The assessment, diagnosis, and plan were formulated together and I agree with the documentation in the resident's note.  Despite Tyler Wilkinson being a very talkative man, he had difficulty describing the characteristics of his dizziness and other symptoms.  Regarding knee pain, we are holding off on imaging at this time.  He is ambulatory and we will focus on symptoms management initially.  No instability noted on exam today. 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

## 2022-10-07 ENCOUNTER — Other Ambulatory Visit: Payer: Self-pay

## 2022-10-07 MED ORDER — TRULICITY 1.5 MG/0.5ML ~~LOC~~ SOAJ: 1.5000 mg | SUBCUTANEOUS | 3 refills | Status: DC

## 2022-10-07 MED ORDER — HYDROCHLOROTHIAZIDE 25 MG PO TABS: 25.0000 mg | ORAL_TABLET | Freq: Every day | ORAL | 3 refills | Status: DC

## 2022-10-07 MED ORDER — ROSUVASTATIN CALCIUM 5 MG PO TABS: 5.0000 mg | ORAL_TABLET | Freq: Every day | ORAL | 3 refills | Status: DC

## 2022-10-07 NOTE — Telephone Encounter (Signed)
Call to pt regarding Cpap machine-no answer, message left on on recorder for return call.Despina Hidden Cassady2/19/20244:24 PM

## 2022-10-08 ENCOUNTER — Telehealth: Payer: Self-pay

## 2022-10-08 NOTE — Telephone Encounter (Signed)
Decision:Approved Crista Luria (Key: [place]) PA Case ID #: [id number] Rx #: [id number] Need Help? Call us at [phone] Outcome Approved today Request Reference Number: [id number]. TRULICITY INJ [id number] is approved through 10/09/2023. Your patient may now fill this prescription and it will be covered. Authorization Expiration Date: Q000111Q Drug Trulicity Q000111Q pen-injectors ePA cloud logo Form [facility] Electronic Prior Authorization Form [phone] NCPDP) Original Claim Info 75

## 2022-10-08 NOTE — Telephone Encounter (Signed)
Prior Authorization for patient (Trulicity) came through on cover my meds was submitted with last office notes awaiting approval or denial

## 2022-10-17 ENCOUNTER — Ambulatory Visit (INDEPENDENT_AMBULATORY_CARE_PROVIDER_SITE_OTHER): Payer: BC Managed Care – PPO | Admitting: Student

## 2022-10-17 DIAGNOSIS — J069 Acute upper respiratory infection, unspecified: Secondary | ICD-10-CM | POA: Diagnosis not present

## 2022-10-17 MED ORDER — CORICIDIN HBP 10-325-2 MG PO TABS: 1.0000 | ORAL_TABLET | Freq: Four times a day (QID) | ORAL | 0 refills | Status: DC

## 2022-10-17 NOTE — Assessment & Plan Note (Signed)
Patient is presenting via telephone for dry cough, body aches, chills, and runny nose for the last four days. He reports there have been multiple people sick at his work recently, although he does not pinpoint any particular close contact. His symptoms started four days ago, states the cough has been slightly better today than previously. He has been able to eat and drink, although has not had much of an appetite. He denies any fevers, dyspnea, nausea, vomiting, abdominal pain, diarrhea. He had COVID-19 infection roughly two months ago and has had two of the vaccines.   Discussed with Mr. Corinna Lines he likely has viral URI. Given his symptoms seem to have peaked, we discussed supportive treatment. I do not think there is a role for testing for certain pathogens at this point. Will send in Coricidin HBP to help with cough, body aches. Patient is to follow-up with his PCP next week.  - Coricidin HBP - Return precautions given - Follow-up with PCP next week

## 2022-10-17 NOTE — Progress Notes (Signed)
  [facility] Internal Medicine Residency Telephone Encounter Continuity Care Appointment  HPI:  This telephone encounter was created for Mr. MANUEL BLEGEN on 10/17/2022 for the following purpose/cc cough.   Past Medical History:  Past Medical History:  Diagnosis Date   Asthma    CAD (coronary artery disease)    stent RCA 2006 (other residual disease). /  nuclear 2008  no ischemia, inferolateral scar.   Cataract    Chronic bronchitis ([place])    "anytime I get sick it goes into bronchitis; probably get it q yr"   Daily headache    "just recently" (05/10/2014)   Dyslipidemia    Educated about COVID-19 virus infection 01/04/2019   Ejection fraction    EF 55%,echo, 2008, inferolateral hypokinesis,   Erectile dysfunction    Flu-like symptoms 09/30/2018   Gastroparesis    Hypertension    Myocardial infarction ([place]) 2006   Severe obstructive sleep apnea-hypopnea syndrome 04/15/2009   Sleep study 03/15/2009 AHI of 82.2/hr oxygen desaturation nadir of 77%, loud snoring. Did not tolerate CPAP due to mask.   Sprain of right ankle 07/10/2012   Tooth pain 05/23/2017   Type 2 diabetes mellitus with complications ([place]) dx'd 1972   Viral upper respiratory tract infection 05/21/2017   Vomiting 11/14/2016     ROS:  As per HPI   Assessment / Plan / Recommendations:  Please see A&P under problem oriented charting for assessment of the patient's acute and chronic medical conditions.  As always, pt is advised that if symptoms worsen or new symptoms arise, they should go to an urgent care facility or to to ER for further evaluation.   Consent and Medical Decision Making:  Patient discussed with Dr. Angelia Mould This is a telephone encounter between Lynden Oxford and Sanjuan Dame on 10/17/2022 for dry cough. The visit was conducted with the patient located at home and Sanjuan Dame at [facility]. The patient's identity was confirmed using their DOB and current address. The patient has consented to being  evaluated through a telephone encounter and understands the associated risks (an examination cannot be done and the patient may need to come in for an appointment) / benefits (allows the patient to remain at home, decreasing exposure to coronavirus). I personally spent 11 minutes on medical discussion.    Viral upper respiratory illness Patient is presenting via telephone for dry cough, body aches, chills, and runny nose for the last four days. He reports there have been multiple people sick at his work recently, although he does not pinpoint any particular close contact. His symptoms started four days ago, states the cough has been slightly better today than previously. He has been able to eat and drink, although has not had much of an appetite. He denies any fevers, dyspnea, nausea, vomiting, abdominal pain, diarrhea. He had COVID-19 infection roughly two months ago and has had two of the vaccines.   Discussed with Mr. Corinna Lines he likely has viral URI. Given his symptoms seem to have peaked, we discussed supportive treatment. I do not think there is a role for testing for certain pathogens at this point. Will send in Coricidin HBP to help with cough, body aches. Patient is to follow-up with his PCP next week.  - Coricidin HBP - Return precautions given - Follow-up with PCP next week  Sanjuan Dame, MD Internal Medicine PGY-3 Pager: [phone]

## 2022-10-21 NOTE — Progress Notes (Signed)
Internal Medicine Clinic Attending  Case discussed with the resident at the time of the visit.  We reviewed the resident's history and exam and pertinent patient test results.  I agree with the assessment, diagnosis, and plan of care documented in the resident's note.  

## 2022-10-24 ENCOUNTER — Ambulatory Visit (INDEPENDENT_AMBULATORY_CARE_PROVIDER_SITE_OTHER): Payer: BC Managed Care – PPO | Admitting: Dietician

## 2022-10-24 ENCOUNTER — Other Ambulatory Visit: Payer: Self-pay

## 2022-10-24 ENCOUNTER — Encounter: Payer: Self-pay | Admitting: Internal Medicine

## 2022-10-24 ENCOUNTER — Ambulatory Visit: Payer: BC Managed Care – PPO | Admitting: Internal Medicine

## 2022-10-24 VITALS — BP 132/63 | HR 79 | Temp 97.7°F | Ht 72.0 in | Wt 218.6 lb

## 2022-10-24 DIAGNOSIS — E1143 Type 2 diabetes mellitus with diabetic autonomic (poly)neuropathy: Secondary | ICD-10-CM

## 2022-10-24 DIAGNOSIS — Z9641 Presence of insulin pump (external) (internal): Secondary | ICD-10-CM

## 2022-10-24 DIAGNOSIS — F339 Major depressive disorder, recurrent, unspecified: Secondary | ICD-10-CM

## 2022-10-24 DIAGNOSIS — N529 Male erectile dysfunction, unspecified: Secondary | ICD-10-CM | POA: Diagnosis not present

## 2022-10-24 DIAGNOSIS — I700 Atherosclerosis of aorta: Secondary | ICD-10-CM

## 2022-10-24 DIAGNOSIS — N401 Enlarged prostate with lower urinary tract symptoms: Secondary | ICD-10-CM | POA: Diagnosis not present

## 2022-10-24 DIAGNOSIS — G4733 Obstructive sleep apnea (adult) (pediatric): Secondary | ICD-10-CM

## 2022-10-24 DIAGNOSIS — Z7984 Long term (current) use of oral hypoglycemic drugs: Secondary | ICD-10-CM

## 2022-10-24 DIAGNOSIS — J069 Acute upper respiratory infection, unspecified: Secondary | ICD-10-CM

## 2022-10-24 LAB — POCT GLYCOSYLATED HEMOGLOBIN (HGB A1C): Hemoglobin A1C: 9.1 % — AB (ref 4.0–5.6)

## 2022-10-24 LAB — GLUCOSE, CAPILLARY: Glucose-Capillary: 82 mg/dL (ref 70–99)

## 2022-10-24 MED ORDER — MELOXICAM 15 MG PO TABS: 15.0000 mg | ORAL_TABLET | Freq: Every day | ORAL | 2 refills | Status: AC | PRN

## 2022-10-24 MED ORDER — EMPAGLIFLOZIN 10 MG PO TABS
10.0000 mg | ORAL_TABLET | Freq: Every day | ORAL | 3 refills | Status: DC
  Filled 2023-06-26: qty 90, 90d supply, fill #0
  Filled 2023-10-09: qty 90, 90d supply, fill #1

## 2022-10-24 NOTE — Assessment & Plan Note (Signed)
Continues on 81 mg of aspirin daily along with Crestor 5 mg daily confirmed he is taking both of these.  Or having to stop the GLP-1 currently but will add evidence-based SGLT2

## 2022-10-24 NOTE — Progress Notes (Signed)
Diabetes Self-Management Education  Visit Type:  6 Month Follow-Up  Appt. Start Time: 1015 Appt. End Time: [id number]  10/24/2022  Mr. Crista Luria, identified by name and date of birth, is a 67 y.o. male with a diagnosis of Diabetes:  .   ASSESSMENT  Mr Siconolfi may benefit from easier device to manage.   Estimated body mass index is 29.65 kg/m as calculated from the following:   Height as of an earlier encounter on 10/24/22: 6' (1.829 m).   Weight as of an earlier encounter on 10/24/22: 218 lb 9.6 oz (99.2 kg).  Lab Results  Component Value Date   HGBA1C 9.1 (A) 10/24/2022   HGBA1C 9.6 (A) 09/05/2022   HGBA1C 7.8 (A) 05/09/2022   HGBA1C 7.9 (A) 02/14/2022   HGBA1C 8.2 (A) 11/22/2021        Diabetes Self-Management Education - 10/24/22 1600       Health Coping   How would you rate your overall health? Fair      Patient Education   Medications Reviewed patients medication for diabetes, action, purpose, timing of dose and side effects.      Individualized Goals (developed by patient)   Medications take my medication as prescribed      Patient Self-Evaluation of Goals - Patient rates self as meeting previously set goals (% of time)   Medications 50 - 75 % (half of the time)   guessing at insulin doses, doe snot carb count, he would benefit from pump with auto adjust technology such as medtronic 780G or Tandem Tslim x2 with control IQ . this was discussed with him today.     Outcomes   Program Status Completed      Subsequent Visit   Since your last visit have you continued or begun to take your medications as prescribed? Yes   not entering correct glucsoe into pump, suggesed upgrading to [place] or using phone to integrate G6 CGM with pump. patient was not interested in using his phone.   Since your last visit have you had your blood pressure checked? No    Since your last visit have you experienced any weight changes? Loss    Weight Loss (lbs) 4    Since your last visit, are you  checking your blood glucose at least once a day? Yes             Learning Objective:  Patient will have a greater understanding of diabetes self-management. Patient education plan is to attend individual and/or group sessions per assessed needs and concerns.   Plan:   Patient Instructions  I will ask Dr. Heber Seaside to order a Dexcom G7 receiver.   Talk to Dr.Hoffman about SGLT-2 inhibitors.   Bring your receiver to your visit in April.  Butch Penny [phone]    Expected Outcomes:  Demonstrated interest in learning. Expect positive outcomes  Education material provided: Diabetes Resources  If problems or questions, patient to contact team via:  Phone  Future DSME appointment: - 6 months Debera Lat, RD 10/24/2022 5:06 PM.

## 2022-10-24 NOTE — Patient Instructions (Signed)
Come back in the morning for your labs

## 2022-10-24 NOTE — Assessment & Plan Note (Signed)
He notes that 100 mg of sildenafil has not been fully effective and achieving and maintaining erections.  He does have chronic low energy I suspect this is partially due to his uncontrolled diabetes and untreated obstructive sleep apnea.  He was previously tested for low testosterone and will repeat this test but I want him to improve his other chronic conditions.

## 2022-10-24 NOTE — Progress Notes (Signed)
Established Patient Office Visit  Subjective   Patient ID: Tyler Wilkinson, male    DOB: 04-28-1956  Age: 67 y.o. MRN: [id number]  Chief Complaint  Patient presents with   Check-up Visit    Coughing,sore throat, ear pain, sneezing. x 2 weeks. Sinus drainage. Tried OTC meds.   Tyler Wilkinson presents today for follow-up of URI as well as diabetes.  Fortunately he is very late to his appointment which provides me is with less time to address all these issues but I did attempt to try to deal with is much as possible.  For his diabetes he has been I am unable to get Trulicity there has been a Conservation officer, nature as well as local shortage especially at the 1.5 mg dose.  For the last 2 weeks he has been having cough and sore throat ear pain and sneezing along with some sinus drainage she has been taking some over-the-counter pills and thinks he may be feeling a little better.  Additionally for about the past 2 to 3 weeks he has had some runny stools he is not getting dehydrated and able to maintain his oral intake.  His weight is essentially stable.  He is afebrile today.  He has not been taking hydrochlorothiazide Dr. Jodi Mourning instructed him to stop this but it was not removed from his medication list he did fill a subsequent prescription but has not been taking it awaiting instructions from me.    Patient Active Problem List   Diagnosis Date Noted   Knee pain, right 09/05/2022   Dizziness 09/05/2022   Healthcare maintenance 05/20/2021   COVID-19 04/11/2021   Paronychia of finger, right 03/29/2021   History of onychomycosis 03/29/2021   Post concussive syndrome 08/02/2019   Benign localized prostatic hyperplasia with lower urinary tract symptoms (LUTS) 08/28/2018   Viral upper respiratory illness 05/21/2017   Abdominal aortic atherosclerosis ([place]) [id number]   H/O umbilical hernia repair [id number]   Chronic back pain 10/07/2014   Major depression, recurrent, chronic ([place]) 10/07/2014   Essential  hypertension 05/10/2014   Diabetic gastroparesis ([place]) 05/05/2012   Dental caries 05/05/2012   Hyperlipidemia    CAD (coronary artery disease), native coronary artery    Bilateral carpal tunnel syndrome 06/09/2010   ULNAR NEUROPATHY 06/09/2010   Obstructive sleep apnea hypopnea, severe 04/15/2009   ASTHMA, INTRINSIC, WITH ACUTE EXACERBATION 12/24/2006   Allergic rhinitis 07/28/2006   ERECTILE DYSFUNCTION, ORGANIC 07/28/2006   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 Negative for chills and fever.  HENT:  Negative for hearing loss.   Eyes:  Negative for pain.  Respiratory:  Positive for cough.   Gastrointestinal:  Positive for diarrhea. Negative for abdominal pain, blood in stool, constipation, melena, nausea and vomiting.  Genitourinary:  Negative for frequency and urgency.  Musculoskeletal:  Positive for joint pain (knees). Negative for myalgias.  Neurological:  Negative for dizziness.  Endo/Heme/Allergies:  Negative for polydipsia.  Psychiatric/Behavioral:  Negative for depression and substance abuse.       Objective:     BP 132/63 (BP Location: Left Arm, Patient Position: Sitting, Cuff Size: Normal)   Pulse 79   Temp 97.7 F (36.5 C)   Ht 6' (1.829 m)   Wt 218 lb 9.6 oz (99.2 kg)   SpO2 99% Comment: RA  BMI 29.65 kg/m  BP Readings from Last 3 Encounters:  10/24/22 132/63  09/05/22 137/77  07/23/22 (!) 150/81  Wt Readings from Last 3 Encounters:  10/24/22 218 lb 9.6 oz (99.2 kg)  09/05/22 222 lb 12.8 oz (101.1 kg)  07/23/22 222 lb 0.1 oz (100.7 kg)      Physical Exam Vitals and nursing note reviewed.  Constitutional:      Appearance: Normal appearance.  HENT:     Head: Normocephalic and atraumatic.  Cardiovascular:     Rate and Rhythm: Normal rate and regular rhythm.  Pulmonary:     Effort: Pulmonary effort is normal.     Breath sounds: Normal  breath sounds.  Abdominal:     General: Abdomen is flat.     Palpations: Abdomen is soft.  Musculoskeletal:     Right lower leg: No edema.     Left lower leg: No edema.  Neurological:     Mental Status: He is alert.  Psychiatric:        Mood and Affect: Mood normal.      Results for orders placed or performed in visit on 10/24/22  Glucose, capillary  Result Value Ref Range   Glucose-Capillary 82 70 - 99 mg/dL  Results for orders placed or performed in visit on 10/24/22  POC Hbg A1C  Result Value Ref Range   Hemoglobin A1C 9.1 (A) 4.0 - 5.6 %   HbA1c POC (<> result, manual entry)     HbA1c, POC (prediabetic range)     HbA1c, POC (controlled diabetic range)      Last CBC Lab Results  Component Value Date   WBC 6.6 05/09/2022   HGB 15.1 05/09/2022   HCT 44.0 05/09/2022   MCV 85 05/09/2022   MCH 29.0 05/09/2022   RDW 13.2 05/09/2022   PLT 203 [id number]   Last metabolic panel Lab Results  Component Value Date   GLUCOSE 132 (H) 05/09/2022   NA 139 05/09/2022   K 3.8 05/09/2022   CL 103 05/09/2022   CO2 23 05/09/2022   BUN 13 05/09/2022   CREATININE 1.02 05/09/2022   EGFR 81 05/09/2022   CALCIUM 9.5 05/09/2022   PROT 6.5 06/29/2020   ALBUMIN 4.2 11/22/2021   LABGLOB 2.2 06/29/2020   AGRATIO 2.0 06/29/2020   BILITOT 0.4 06/29/2020   ALKPHOS 106 06/29/2020   AST 28 06/29/2020   ALT 38 06/29/2020   ANIONGAP 9 04/10/2021   Last hemoglobin A1c Lab Results  Component Value Date   HGBA1C 9.1 (A) 10/24/2022   Last vitamin D Lab Results  Component Value Date   VD25OH 51.0 11/22/2021      The 10-year ASCVD risk score (Arnett DK, et al., 2019) is: 28.4%    Assessment & Plan:   Problem List Items Addressed This Visit       Cardiovascular and Mediastinum   Abdominal aortic atherosclerosis ([place])    Continues on 81 mg of aspirin daily along with Crestor 5 mg daily confirmed he is taking both of these.  Or having to stop the GLP-1 currently but will add  evidence-based SGLT2        Respiratory   Viral upper respiratory illness    Reassurance that his symptoms are typical for upper respiratory virus infections and I expect them to improve over the next 2 to 4 weeks.      Obstructive sleep apnea hypopnea, severe    Still has not been on CPAP therapy.        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  Not been able to get Trulicity we will start Jardiance 10 mg daily he is currently off HCTZ and will keep that diuretic off for now while starting Jardiance.      Relevant Medications   empagliflozin (JARDIANCE) 10 MG TABS tablet   Other Relevant Orders   POC Hbg A1C (Completed)     Genitourinary   Benign localized prostatic hyperplasia with lower urinary tract symptoms (LUTS) (Chronic)    Taking tamsulosin 0.4 mg daily with moderate effect.  Recheck PSA today      Relevant Orders   PSA     Other   Major depression, recurrent, chronic ([place])       09/05/2022    2:57 PM 02/14/2022   11:20 AM 11/22/2021    4:07 PM  PHQ9 SCORE ONLY  PHQ-9 Total Score '15 13 21  '$ PHQ-9 remains elevated.  Question of how effective Wellbutrin has been may consider SSRI however with sexual side effect concerns we have been holding off.  Also may need to titrate the dose of Wellbutrin up but will defer this to next visit.      ERECTILE DYSFUNCTION, ORGANIC    He notes that 100 mg of sildenafil has not been fully effective and achieving and maintaining erections.  He does have chronic low energy I suspect this is partially due to his uncontrolled diabetes and untreated obstructive sleep apnea.  He was previously tested for low testosterone and will repeat this test but I want him to improve his other chronic conditions.      Relevant Orders   Testosterone,Free and Total   PSA    Return in about 3 months (around 01/24/2023).    Tyler Groves, DO

## 2022-10-24 NOTE — Assessment & Plan Note (Signed)
Still has not been on CPAP therapy.

## 2022-10-24 NOTE — Assessment & Plan Note (Signed)
Taking tamsulosin 0.4 mg daily with moderate effect.  Recheck PSA today

## 2022-10-24 NOTE — Assessment & Plan Note (Signed)
Reassurance that his symptoms are typical for upper respiratory virus infections and I expect them to improve over the next 2 to 4 weeks.

## 2022-10-24 NOTE — Assessment & Plan Note (Signed)
He is using his OmniPod and Dexcom G6.  Not been able to get Trulicity we will start Jardiance 10 mg daily he is currently off HCTZ and will keep that diuretic off for now while starting Jardiance.

## 2022-10-24 NOTE — Patient Instructions (Addendum)
I will ask Dr. Heber [place] Head to order a [place] receiver.   Talk to Dr.Hoffman about SGLT-2 inhibitors.   Bring your receiver to your visit in April.  Butch Penny [phone]

## 2022-10-24 NOTE — Assessment & Plan Note (Signed)
    09/05/2022    2:57 PM 02/14/2022   11:20 AM 11/22/2021    4:07 PM  PHQ9 SCORE ONLY  PHQ-9 Total Score '15 13 21   '$ PHQ-9 remains elevated.  Question of how effective Wellbutrin has been may consider SSRI however with sexual side effect concerns we have been holding off.  Also may need to titrate the dose of Wellbutrin up but will defer this to next visit.

## 2022-11-05 ENCOUNTER — Other Ambulatory Visit (INDEPENDENT_AMBULATORY_CARE_PROVIDER_SITE_OTHER): Payer: BC Managed Care – PPO

## 2022-11-05 DIAGNOSIS — N529 Male erectile dysfunction, unspecified: Secondary | ICD-10-CM

## 2022-11-05 DIAGNOSIS — N401 Enlarged prostate with lower urinary tract symptoms: Secondary | ICD-10-CM

## 2022-11-06 LAB — TESTOSTERONE,FREE AND TOTAL
Testosterone, Free: 5.3 pg/mL — ABNORMAL LOW (ref 6.6–18.1)
Testosterone: 303 ng/dL (ref 264–916)

## 2022-11-06 LAB — PSA: Prostate Specific Ag, Serum: 2.7 ng/mL (ref 0.0–4.0)

## 2022-11-11 ENCOUNTER — Telehealth: Payer: Self-pay | Admitting: Dietician

## 2022-11-13 NOTE — Telephone Encounter (Signed)
Left voicemail for return call  

## 2022-11-20 ENCOUNTER — Telehealth: Payer: Self-pay

## 2022-11-20 NOTE — Telephone Encounter (Signed)
Patient called he is requesting a call back regarding his lab results for Testosterone.

## 2022-11-21 ENCOUNTER — Other Ambulatory Visit: Payer: Self-pay

## 2022-11-21 ENCOUNTER — Encounter: Payer: Self-pay | Admitting: Dietician

## 2022-11-21 ENCOUNTER — Telehealth: Payer: Self-pay | Admitting: Dietician

## 2022-11-21 DIAGNOSIS — E1143 Type 2 diabetes mellitus with diabetic autonomic (poly)neuropathy: Secondary | ICD-10-CM

## 2022-11-21 MED ORDER — DEXCOM G6 TRANSMITTER MISC
1.0000 | Freq: Every day | 3 refills | Status: DC
  Filled 2023-03-28: qty 1, 30d supply, fill #0
  Filled 2023-04-02 – 2023-04-25 (×2): qty 1, 90d supply, fill #0

## 2022-11-21 NOTE — Telephone Encounter (Signed)
Left voicemail for return call  

## 2022-11-25 NOTE — Telephone Encounter (Signed)
Called back and left message, noted we will want to retest his labs again (as previously planned if on low side).

## 2022-12-03 ENCOUNTER — Telehealth: Payer: Self-pay | Admitting: Dietician

## 2022-12-03 DIAGNOSIS — R5383 Other fatigue: Secondary | ICD-10-CM

## 2022-12-03 DIAGNOSIS — N529 Male erectile dysfunction, unspecified: Secondary | ICD-10-CM

## 2022-12-03 DIAGNOSIS — R413 Other amnesia: Secondary | ICD-10-CM

## 2022-12-03 NOTE — Telephone Encounter (Signed)
Called asking about empagliflozin, he plans o start it today. He would like to do a telehealth visit with Dr. Mikey Bussing if possible.  New phone number: [phone]

## 2022-12-10 ENCOUNTER — Telehealth: Payer: Self-pay | Admitting: Dietician

## 2022-12-10 NOTE — Telephone Encounter (Signed)
Talked to him about starting his jardiance, he will do this.  Also talked about rechecking his testosterone. He will do, will also check TSH and b12 given his fatigue and memory issues.   F/u with me in about 4-6 weeks.

## 2022-12-10 NOTE — Telephone Encounter (Signed)
Left voicemail for return call  

## 2022-12-10 NOTE — Addendum Note (Signed)
Addended by: Gust Rung on: 12/10/2022 04:11 PM   Modules accepted: Orders

## 2022-12-12 ENCOUNTER — Other Ambulatory Visit: Payer: Self-pay

## 2022-12-13 MED ORDER — INSULIN ASPART 100 UNIT/ML IJ SOLN: INTRAMUSCULAR | 11 refills | Status: DC

## 2022-12-16 ENCOUNTER — Telehealth: Payer: Self-pay

## 2022-12-16 NOTE — Telephone Encounter (Addendum)
Patient calls for diabetes support: saying his legs were cramping while sleeping for 1-2 days last week and peeing more started about a week ago. Scared to start new medicine (has not started the SGLT2i). Not taking Trulicity. Sugar was running high a few times (300s and HI) he does not know what they are now because he didn't have Dexcom for a week, ran out of transmitter. He is trying to drink more water. Agreed to restart Trulicity, restart Dexcom. He agreed to call if increased peeing continues.

## 2022-12-16 NOTE — Telephone Encounter (Signed)
Prior Authorization for patient (Insulin Aspart Flexpen) came through on cover my meds was submitted with last office notes and labs awaiting approval or denial

## 2022-12-17 NOTE — Telephone Encounter (Signed)
Decision:Denied Tyler Wilkinson (Key: [id number]) PA Case ID #: [id number] Need Help? Call us at [phone] Outcome Denied on April 29 Request Reference Number: [id number]. INSULIN ASPA INJ FLEXPEN is denied due to Plan Exclusion. For further questions, call [phone]. Drug Insulin Aspart FlexPen 100UNIT/ML pen-injectors ePA cloud logo Form [facility] Electronic Prior Authorization Form [phone] NCPDP)

## 2022-12-18 NOTE — Telephone Encounter (Signed)
No they did not list any preferred medications.

## 2022-12-18 NOTE — Telephone Encounter (Signed)
Agree with plan 

## 2022-12-18 NOTE — Telephone Encounter (Signed)
Did they list what there preferred short acting insulin is?

## 2022-12-23 ENCOUNTER — Other Ambulatory Visit: Payer: Self-pay | Admitting: *Deleted

## 2022-12-23 MED ORDER — OMNIPOD DASH PODS (GEN 4) MISC: 11 refills | Status: DC

## 2022-12-23 NOTE — Telephone Encounter (Signed)
I think the PA is for aspart vials not flexpens. Unsure how the flexpens part got in the PA? I do not see flexpens on his mediation list, I only see vials for his insulin pump.

## 2022-12-26 NOTE — Telephone Encounter (Signed)
I called [facility], pharmacy, per pharmacist: they do not have Novolog Flexpens on his medication list, they are not waiting on a PA for him for anything. She ran his Novolog vials and it said it was too soon, it did not ask for a PA. She was not sure where the PA came from.

## 2022-12-26 NOTE — Telephone Encounter (Signed)
Durward Mallard are you able to help assist the patient?

## 2022-12-31 ENCOUNTER — Telehealth: Payer: Self-pay

## 2022-12-31 DIAGNOSIS — R350 Frequency of micturition: Secondary | ICD-10-CM

## 2022-12-31 NOTE — Telephone Encounter (Signed)
Patient called regarding labs for testosterone, patient wants to clarify the timeframe of when he will need to be here. 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 Please call patient to discuss further.

## 2023-01-01 NOTE — Addendum Note (Signed)
Addended by: Carlynn Purl C on: 01/01/2023 01:06 PM   Modules accepted: Orders

## 2023-01-01 NOTE — Telephone Encounter (Signed)
I called him, let him know that ideally we get testosterone first thing in the morning before 10am.  However given he wakes up normally at 11am we would want to get it within about 1-2 hours of his normal wake up time, ideally if he can get here before our lab breaks for lunch or first thing when they return.    He is also having some urinary frequency he asks me about, will collect a UA when he comes.

## 2023-01-02 ENCOUNTER — Other Ambulatory Visit: Payer: BC Managed Care – PPO

## 2023-01-02 DIAGNOSIS — R350 Frequency of micturition: Secondary | ICD-10-CM

## 2023-01-02 DIAGNOSIS — N529 Male erectile dysfunction, unspecified: Secondary | ICD-10-CM

## 2023-01-02 DIAGNOSIS — R5383 Other fatigue: Secondary | ICD-10-CM

## 2023-01-03 LAB — URINALYSIS, ROUTINE W REFLEX MICROSCOPIC
Bilirubin, UA: NEGATIVE
Ketones, UA: NEGATIVE
Leukocytes,UA: NEGATIVE
Nitrite, UA: NEGATIVE
Protein,UA: NEGATIVE
RBC, UA: NEGATIVE
Specific Gravity, UA: 1.026 (ref 1.005–1.030)
Urobilinogen, Ur: 0.2 mg/dL (ref 0.2–1.0)
pH, UA: 5.5 (ref 5.0–7.5)

## 2023-01-04 LAB — TESTOSTERONE,FREE AND TOTAL: Testosterone: 379 ng/dL (ref 264–916)

## 2023-01-07 ENCOUNTER — Other Ambulatory Visit: Payer: Self-pay

## 2023-01-07 DIAGNOSIS — E1143 Type 2 diabetes mellitus with diabetic autonomic (poly)neuropathy: Secondary | ICD-10-CM

## 2023-01-07 LAB — TESTOSTERONE,FREE AND TOTAL: Testosterone, Free: 7.7 pg/mL (ref 6.6–18.1)

## 2023-01-07 MED ORDER — DEXCOM G6 SENSOR MISC
1.0000 | 12 refills | Status: DC
Start: 2023-01-07 — End: 2023-03-07
  Filled 2023-03-07: qty 3, 30d supply, fill #0

## 2023-01-28 ENCOUNTER — Telehealth: Payer: Self-pay | Admitting: Dietician

## 2023-01-28 NOTE — Telephone Encounter (Signed)
Coming in on Monday, June 17 at 145 pm to see Dr. Cliffton Asters. Was asked to make an appointment with Dr. Mikey Bussing, but was told he has no openings. Says he is waiting to hear back on last test results.  He has been keeping his blood sugar around 200 mg/dL (under 409 mg/dl per Dr. Mikey Bussing). Would like to get back on CPAP. He says he retired 2 weeks ago. He is willing to do phone visits to talk to Dr. Mikey Bussing as needed.

## 2023-02-03 ENCOUNTER — Ambulatory Visit (INDEPENDENT_AMBULATORY_CARE_PROVIDER_SITE_OTHER): Payer: Self-pay

## 2023-02-03 ENCOUNTER — Ambulatory Visit (INDEPENDENT_AMBULATORY_CARE_PROVIDER_SITE_OTHER): Payer: Self-pay | Admitting: Dietician

## 2023-02-03 VITALS — BP 140/59 | HR 75 | Temp 98.2°F | Ht 72.0 in | Wt 223.7 lb

## 2023-02-03 DIAGNOSIS — G4733 Obstructive sleep apnea (adult) (pediatric): Secondary | ICD-10-CM

## 2023-02-03 DIAGNOSIS — E1143 Type 2 diabetes mellitus with diabetic autonomic (poly)neuropathy: Secondary | ICD-10-CM

## 2023-02-03 DIAGNOSIS — Z713 Dietary counseling and surveillance: Secondary | ICD-10-CM

## 2023-02-03 DIAGNOSIS — Z7984 Long term (current) use of oral hypoglycemic drugs: Secondary | ICD-10-CM

## 2023-02-03 DIAGNOSIS — F339 Major depressive disorder, recurrent, unspecified: Secondary | ICD-10-CM

## 2023-02-03 DIAGNOSIS — N529 Male erectile dysfunction, unspecified: Secondary | ICD-10-CM

## 2023-02-03 DIAGNOSIS — I10 Essential (primary) hypertension: Secondary | ICD-10-CM

## 2023-02-03 LAB — GLUCOSE, CAPILLARY: Glucose-Capillary: 296 mg/dL — ABNORMAL HIGH (ref 70–99)

## 2023-02-03 LAB — POCT GLYCOSYLATED HEMOGLOBIN (HGB A1C): Hemoglobin A1C: 8.5 % — AB (ref 4.0–5.6)

## 2023-02-03 MED ORDER — SILDENAFIL CITRATE 100 MG PO TABS: 100.0000 mg | ORAL_TABLET | ORAL | 3 refills | Status: DC | PRN

## 2023-02-03 MED ORDER — AMLODIPINE BESY-BENAZEPRIL HCL 10-40 MG PO CAPS: 1.0000 | ORAL_CAPSULE | Freq: Every day | ORAL | 2 refills | Status: DC

## 2023-02-03 MED ORDER — BUPROPION HCL ER (XL) 300 MG PO TB24
300.0000 mg | ORAL_TABLET | Freq: Every day | ORAL | 3 refills | Status: DC
Start: 2023-02-03 — End: 2023-11-13
  Filled 2023-03-13: qty 90, 90d supply, fill #0
  Filled 2023-07-11: qty 90, 90d supply, fill #1
  Filled 2023-10-09: qty 90, 90d supply, fill #2

## 2023-02-03 MED ORDER — TRULICITY 3 MG/0.5ML ~~LOC~~ SOAJ: 3.0000 mg | SUBCUTANEOUS | 0 refills | Status: DC

## 2023-02-03 MED ORDER — ROSUVASTATIN CALCIUM 5 MG PO TABS: 5.0000 mg | ORAL_TABLET | Freq: Every day | ORAL | 3 refills | Status: DC

## 2023-02-03 NOTE — Progress Notes (Signed)
CC: Diabetes follow-up  HPI:  Tyler Wilkinson is a 67 y.o. male with past medical history as below who presents for diabetes follow-up.  Please see detailed assessment and plan for HPI.  Past Medical History:  Diagnosis Date   Asthma    CAD (coronary artery disease)    stent RCA 2006 (other residual disease). /  nuclear 2008  no ischemia, inferolateral scar.   Cataract    Chronic bronchitis (HCC)    "anytime I get sick it goes into bronchitis; probably get it q yr"   Daily headache    "just recently" (05/10/2014)   Dyslipidemia    Educated about COVID-19 virus infection 01/04/2019   Ejection fraction    EF 55%,echo, 2008, inferolateral hypokinesis,   Erectile dysfunction    Flu-like symptoms 09/30/2018   Gastroparesis    Hypertension    Myocardial infarction (HCC) 2006   Severe obstructive sleep apnea-hypopnea syndrome 04/15/2009   Sleep study 03/15/2009 AHI of 82.2/hr oxygen desaturation nadir of 77%, loud snoring. Did not tolerate CPAP due to mask.   Sprain of right ankle 07/10/2012   Tooth pain 05/23/2017   Type 2 diabetes mellitus with complications (HCC) dx'd 1972   Viral upper respiratory tract infection 05/21/2017   Vomiting 11/14/2016   Review of Systems: Please see detailed assessment and plan for pertinent ROS.  Physical Exam:  Vitals:   02/03/23 1400 02/03/23 1454  BP: (!) 154/73 (!) 140/59  Pulse: 81 75  Temp: 98.2 F (36.8 C)   TempSrc: Oral   SpO2: 98%   Weight: 223 lb 11.2 oz (101.5 kg)   Height: 6' (1.829 m)    Physical Exam Constitutional:      General: He is not in acute distress. HENT:     Head: Normocephalic and atraumatic.  Eyes:     Extraocular Movements: Extraocular movements intact.  Cardiovascular:     Rate and Rhythm: Normal rate and regular rhythm.     Heart sounds: No murmur heard. Pulmonary:     Effort: Pulmonary effort is normal.     Breath sounds: No wheezing or rales.  Musculoskeletal:     Cervical back: Neck supple.      Right lower leg: No edema.     Left lower leg: No edema.  Skin:    General: Skin is warm and dry.  Neurological:     Mental Status: He is alert and oriented to person, place, and time.  Psychiatric:        Mood and Affect: Mood normal.        Behavior: Behavior normal.      Assessment & Plan:   See Encounters Tab for problem based charting.  Essential hypertension Patient presents with a history of hypertension.  Blood pressure today 154/73.  He is currently prescribed Lotrel 5-40 mg daily.  Denies shortness of breath or swelling.  Does report chest pain but his chest pain is not substernal, does not radiate anywhere, and improves with exertion/activity. 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  He had been prescribed Jardiance but didn't take because he was worried about kidney function.  He also has an OmniPod. CGM indicates in range 49% of the time. No lows. Spiking at nighttime after dinner. -Plans to meet with diabetic educator today and review CGM/insulin pump -Start Jardiance  -Increase Trulicity to 3  mg weekly  Major depression, recurrent, chronic (HCC) Elevated PHQ-9's in the past.  He endorses mainly sadness and disinterest in getting out of his house and doing things.  Endorses lack of motivation.  Also endorses erectile dysfunction which he feels may be related to his depression.  He is interested in trying Wellbutrin.  Denies SI.  He denies any anxious symptoms. -Trial Wellbutrin  Obstructive sleep apnea hypopnea, severe Underwent sleep study in 2018.  Provided CPAP but does not currently have machine. -Referral to sleep center  ERECTILE DYSFUNCTION, ORGANIC Patient presents for follow-up of his erectile dysfunction.  His testosterone is within normal limits and he does not have signs of hypogonadism.  He  is also at risk for cardiovascular disease and has a history of RCA stent.  I do not feel the testosterone therapy is appropriate for this gentleman at this time for his erectile dysfunction or his mood symptoms, though he does inquire about this.  I feel that Wellbutrin is a good therapy for him given it will also help with his depression.  He does ask for referral to urology and I feel like this is appropriate given his symptoms are refractory to Viagra. -Repeat trial of Wellbutrin -Refill Viagra per patient's request -Referral to urology  Patient discussed with Dr. Antony Contras

## 2023-02-03 NOTE — Assessment & Plan Note (Addendum)
Patient presents with a history of type 2 diabetes.  He had been prescribed Jardiance but didn't take because he was worried about kidney function.  He also has an OmniPod. CGM indicates in range 49% of the time. No lows. Spiking at nighttime after dinner. -Plans to meet with diabetic educator today and review CGM/insulin pump -Start Jardiance  -Increase Trulicity to 3 mg weekly

## 2023-02-03 NOTE — Assessment & Plan Note (Signed)
Patient presents for follow-up of his erectile dysfunction.  His testosterone is within normal limits and he does not have signs of hypogonadism.  He is also at risk for cardiovascular disease and has a history of RCA stent.  I do not feel the testosterone therapy is appropriate for this gentleman at this time for his erectile dysfunction or his mood symptoms, though he does inquire about this.  I feel that Wellbutrin is a good therapy for him given it will also help with his depression.  He does ask for referral to urology and I feel like this is appropriate given his symptoms are refractory to Viagra. -Repeat trial of Wellbutrin -Refill Viagra per patient's request -Referral to urology

## 2023-02-03 NOTE — Patient Instructions (Signed)
Tyler Wilkinson, it was a pleasure seeing you today!  Today we discussed: Diabetes - take jardiance and continue omnipod. Start trulicity 2 mg weekly. Blood pressure - take Lotrel 10-40 mg daily. Return in 1 month.  Referral sent for sleep center and urology.  I have ordered the following labs today:  Lab Orders         POC Hbg A1C      Tests ordered today:  none  Referrals ordered today:   Referral Orders         Ambulatory referral to Pulmonology         Ambulatory referral to Urology      I have ordered the following medication/changed the following medications:   Stop the following medications: Medications Discontinued During This Encounter  Medication Reason   Dulaglutide (TRULICITY) 1.5 MG/0.5ML SOPN Dose change   buPROPion (WELLBUTRIN XL) 300 MG 24 hr tablet Reorder   rosuvastatin (CRESTOR) 5 MG tablet Reorder   amLODipine-benazepril (LOTREL) 5-40 MG capsule Dose change     Start the following medications: Meds ordered this encounter  Medications   buPROPion (WELLBUTRIN XL) 300 MG 24 hr tablet    Sig: Take 1 tablet (300 mg total) by mouth daily.    Dispense:  90 tablet    Refill:  3   rosuvastatin (CRESTOR) 5 MG tablet    Sig: Take 1 tablet (5 mg total) by mouth daily.    Dispense:  90 tablet    Refill:  3   amLODipine-benazepril (LOTREL) 10-40 MG capsule    Sig: Take 1 capsule by mouth daily.    Dispense:  30 capsule    Refill:  2     Follow-up:  1 month    Please make sure to arrive 15 minutes prior to your next appointment. If you arrive late, you may be asked to reschedule.   We look forward to seeing you next time. Please call our clinic at [phone] if you have any questions or concerns. The best time to call is Monday-Friday from 9am-4pm, but there is someone available 24/7. If after hours or the weekend, call the main hospital number and ask for the Internal Medicine Resident On-Call. If you need medication refills, please notify your  pharmacy one week in advance and they will send [place] a request.  Thank you for letting us take part in your care. Wishing you the best!  Thank you, Adron Bene, MD

## 2023-02-03 NOTE — Progress Notes (Signed)
Diabetes Self-Management Education  Visit Type: Annual Follow-Up  Appt. Start Time: 1500 Appt. End Time: 1530  02/03/2023  [name], identified by name and date of birth, is a 67 y.o. male with a diagnosis of Diabetes:  .   ASSESSMENT  Basal rate is 1.65 u/hour for a total of 39.6. However, because he is not changing his pod in a timely manner(reported by patient) and giving insulin by injection regularly, his pump total are inaccurate. His total basal insulin is ~55% of his total daily dose with addition of insulin from injection. Lab Results  Component Value Date   HGBA1C 8.5 (A) 02/03/2023   HGBA1C 9.1 (A) 10/24/2022   HGBA1C 9.6 (A) 09/05/2022   HGBA1C 7.8 (A) 05/09/2022   HGBA1C 7.9 (A) 02/14/2022    Estimated body mass index is 30.34 kg/m as calculated from the following:   Height as of an earlier encounter on 02/03/23: 6' (1.829 m).   Weight as of an earlier encounter on 02/03/23: 223 lb 11.2 oz (101.5 kg). Wt Readings from Last 10 Encounters:  02/03/23 223 lb 11.2 oz (101.5 kg)  10/24/22 218 lb 9.6 oz (99.2 kg)  09/05/22 222 lb 12.8 oz (101.1 kg)  07/23/22 222 lb 0.1 oz (100.7 kg)  05/09/22 222 lb 1.6 oz (100.7 kg)  02/14/22 228 lb 1.6 oz (103.5 kg)  11/29/21 223 lb (101.2 kg)  11/22/21 221 lb 8 oz (100.5 kg)  05/16/21 223 lb 12.8 oz (101.5 kg)  03/29/21 227 lb 12.8 oz (103.3 kg)   BP Readings from Last 3 Encounters:  02/03/23 (!) 140/59  10/24/22 132/63  09/05/22 137/77      Diabetes Self-Management Education - 02/03/23 1600       Visit Information   Visit Type Annual Follow-Up      Health Coping   How would you rate your overall health? Fair   states his depression is bothering him     Psychosocial Assessment   Patient Belief/Attitude about Diabetes Motivated to manage diabetes    What is the hardest part about your diabetes right now, causing you the most concern, or is the most worrisome to you about your diabetes?   Making healty food and  beverage choices    Self-care barriers Lack of material resources;Debilitated state due to current medical condition    Self-management support Family;Doctor's office;CDE visits    Patient Concerns Medication;Problem Solving;Support    Special Needs None    Preferred Learning Style Auditory    Learning Readiness Ready    How often do you need to have someone help you when you read instructions, pamphlets, or other written materials from your doctor or pharmacy? 2 - Rarely    What is the last grade level you completed in school? 12      Pre-Education Assessment   Patient understands the diabetes disease and treatment process. Comprehends key points    Patient understands incorporating nutritional management into lifestyle. Comprehends key points    Patient undertands incorporating physical activity into lifestyle. Comprehends key points    Patient understands using medications safely. Needs Review    Patient understands monitoring blood glucose, interpreting and using results Comprehends key points    Patient understands prevention, detection, and treatment of acute complications. Comprehends key points    Patient understands prevention, detection, and treatment of chronic complications. Compreheands key points    Patient understands how to develop strategies to address psychosocial issues. Comprehends key points    Patient understands how to  develop strategies to promote health/change behavior. Comprehends key points      Complications   Last HgB A1C per patient/outside source 8.5 %    How often do you check your blood sugar? --   continuously   Fasting Blood glucose range (mg/dL) 829-562    Postprandial Blood glucose range (mg/dL) >130;865-784    Number of hypoglycemic episodes per month 1    Can you tell when your blood sugar is low? Yes    What do you do if your blood sugar is low? drinks regular soda    Number of hyperglycemic episodes ( >200mg /dL): Daily    Can you tell when your  blood sugar is high? No    Have you had a dilated eye exam in the past 12 months? Yes    Have you had a dental exam in the past 12 months? No    Are you checking your feet? Yes    How many days per week are you checking your feet? 7      Dietary Intake   Breakfast not assessed today as it was not relevant to his requested information      Activity / Exercise   Activity / Exercise Type --   not assessed today as it was not relevant to his requested information     Patient Education   Previous Diabetes Education Yes (please comment)   here   Medications Reviewed patients medication for diabetes, action, purpose, timing of dose and side effects.      Individualized Goals (developed by patient)   Medications take my medication as prescribed      Patient Self-Evaluation of Goals - Patient rates self as meeting previously set goals (% of time)   Medications 50 - 75 % (half of the time)   states he is doing better at changing pods in a timely manner     Post-Education Assessment   Patient understands using medications safely. Comphrehends key points      Outcomes   Expected Outcomes Demonstrated interest in learning but significant barriers to change    Future DMSE 3-4 months    Program Status Completed      Subsequent Visit   Since your last visit have you continued or begun to take your medications as prescribed? No   has not started Jardiance, does not change pod in timely manner causing less insulin to be administered. takes insulin from injection 3-4 days a week   Since your last visit have you had your blood pressure checked? No    Since your last visit have you experienced any weight changes? No change    Since your last visit, are you checking your blood glucose at least once a day? Yes             Individualized Plan for Diabetes Self-Management Training:   Learning Objective:  Patient will have a greater understanding of diabetes self-management. Patient education plan  is to attend individual and/or group sessions per assessed needs and concerns.   Plan:   Patient Instructions  To help you maintain your muscle, you need to eat your protein/meat and vegetables with the increased dose of Trulicity.  Drink an extra 16 ounces of water daily to stay hydrated with Jardiance.   I'll find out how much insulin is in your pod when you get a low reservoir alert.   Your A1c is lower- congrats!!!  I suggest changing your POD earlier before you run out.  This should  help you lower your A1c.   We can follow up in 3 months.   Lupita Leash [phone]      Expected Outcomes:  Demonstrated interest in learning but significant barriers to change  Education material provided: Diabetes Resources  If problems or questions, patient to contact team via:  Phone  Future DSME appointment: 3-4 months Norm Parcel, RD 02/03/2023 4:55 PM.

## 2023-02-03 NOTE — Patient Instructions (Addendum)
To help you maintain your muscle, you need to eat your protein/meat and vegetables with the increased dose of Trulicity.  Drink an extra 16 ounces of water daily to stay hydrated with Jardiance.   I'll find out how much insulin is in your pod when you get a low reservoir alert.   Your A1c is lower- congrats!!!  I suggest changing your POD earlier before you run out.  This should help you lower your A1c.   We can follow up in 3 months.   Lupita Leash [phone]

## 2023-02-03 NOTE — Assessment & Plan Note (Addendum)
Patient presents with a history of hypertension.  Blood pressure today 154/73.  He is currently prescribed Lotrel 5-40 mg daily.  Denies shortness of breath or swelling.  Does report chest pain but his chest pain is not substernal, does not radiate anywhere, and improves with exertion/activity. -Increase Lotrel to 10-40 mg daily -Return in 4 weeks for blood pressure check/follow-up

## 2023-02-03 NOTE — Assessment & Plan Note (Addendum)
Elevated PHQ-9's in the past.  He endorses mainly sadness and disinterest in getting out of his house and doing things.  Endorses lack of motivation.  Also endorses erectile dysfunction which he feels may be related to his depression.  He is interested in trying Wellbutrin.  Denies SI.  He denies any anxious symptoms. -Trial Wellbutrin

## 2023-02-03 NOTE — Assessment & Plan Note (Signed)
Underwent sleep study in 2018.  Provided CPAP but does not currently have machine. -Referral to sleep center

## 2023-02-06 NOTE — Progress Notes (Signed)
[facility] Medicine Clinic Attending  Case discussed with Dr. White  At the time of the visit.  We reviewed the resident's history and exam and pertinent patient test results.  I agree with the assessment, diagnosis, and plan of care documented in the resident's note.  

## 2023-02-27 ENCOUNTER — Encounter: Payer: Self-pay | Admitting: Internal Medicine

## 2023-02-27 ENCOUNTER — Ambulatory Visit: Payer: Medicare Other | Admitting: Internal Medicine

## 2023-02-27 ENCOUNTER — Telehealth: Payer: Self-pay | Admitting: Dietician

## 2023-02-27 ENCOUNTER — Other Ambulatory Visit (HOSPITAL_COMMUNITY): Payer: Self-pay

## 2023-02-27 ENCOUNTER — Other Ambulatory Visit: Payer: Self-pay

## 2023-02-27 VITALS — BP 130/65 | HR 78 | Temp 97.6°F | Ht 72.0 in | Wt 221.4 lb

## 2023-02-27 DIAGNOSIS — I10 Essential (primary) hypertension: Secondary | ICD-10-CM | POA: Diagnosis present

## 2023-02-27 DIAGNOSIS — Z794 Long term (current) use of insulin: Secondary | ICD-10-CM | POA: Diagnosis not present

## 2023-02-27 DIAGNOSIS — N401 Enlarged prostate with lower urinary tract symptoms: Secondary | ICD-10-CM

## 2023-02-27 DIAGNOSIS — E1143 Type 2 diabetes mellitus with diabetic autonomic (poly)neuropathy: Secondary | ICD-10-CM | POA: Diagnosis not present

## 2023-02-27 DIAGNOSIS — F339 Major depressive disorder, recurrent, unspecified: Secondary | ICD-10-CM | POA: Diagnosis not present

## 2023-02-27 DIAGNOSIS — N529 Male erectile dysfunction, unspecified: Secondary | ICD-10-CM | POA: Diagnosis not present

## 2023-02-27 MED ORDER — TADALAFIL 5 MG PO TABS
5.0000 mg | ORAL_TABLET | Freq: Every day | ORAL | 2 refills | Status: DC
  Filled 2023-02-27: qty 30, 30d supply, fill #0
  Filled 2023-07-29 – 2023-09-04 (×3): qty 30, 30d supply, fill #1
  Filled 2023-10-09: qty 30, 30d supply, fill #2

## 2023-02-27 MED ORDER — TRULICITY 3 MG/0.5ML ~~LOC~~ SOAJ
3.0000 mg | SUBCUTANEOUS | 0 refills | Status: DC
  Filled 2023-02-27: qty 2, 28d supply, fill #0

## 2023-02-27 MED ORDER — INSULIN ASPART 100 UNIT/ML IJ SOLN
90.0000 [IU] | Freq: Every day | INTRAMUSCULAR | 11 refills | Status: DC
  Filled 2023-02-27: qty 20, 22d supply, fill #0
  Filled 2023-03-20: qty 30, 33d supply, fill #1
  Filled 2023-05-13: qty 30, 33d supply, fill #2
  Filled 2023-06-13 – 2023-06-16 (×4): qty 30, 33d supply, fill #3
  Filled 2023-07-29 – 2023-08-08 (×2): qty 30, 33d supply, fill #4
  Filled 2023-10-09: qty 30, 33d supply, fill #5
  Filled 2023-11-07: qty 30, 33d supply, fill #6
  Filled 2023-12-18: qty 30, 33d supply, fill #7
  Filled 2024-01-22 – 2024-01-23 (×2): qty 30, 33d supply, fill #8

## 2023-02-27 NOTE — Telephone Encounter (Signed)
Tyler Wilkinson called to tell us that he is in transition between Medicare and adding Medicaid and is out of Trulicity and meds are [phone] $ each. Low on pump supplies, etc

## 2023-02-28 ENCOUNTER — Other Ambulatory Visit: Payer: Self-pay

## 2023-02-28 MED ORDER — HYDROCHLOROTHIAZIDE 25 MG PO TABS
25.0000 mg | ORAL_TABLET | Freq: Every day | ORAL | 3 refills | Status: DC
  Filled 2023-06-26 – 2023-07-29 (×2): qty 90, 90d supply, fill #0

## 2023-02-28 MED ORDER — TRULICITY 3 MG/0.5ML ~~LOC~~ SOAJ
3.0000 mg | SUBCUTANEOUS | 0 refills | Status: DC
  Filled 2023-03-28: qty 2, 28d supply, fill #0
  Filled 2023-05-09 – 2023-05-16 (×2): qty 2, 28d supply, fill #1

## 2023-02-28 MED ORDER — INSULIN ASPART 100 UNIT/ML IJ SOLN: 90.0000 [IU] | Freq: Every day | INTRAMUSCULAR | 11 refills | Status: DC

## 2023-02-28 MED ORDER — SILDENAFIL CITRATE 100 MG PO TABS: 100.0000 mg | ORAL_TABLET | ORAL | 3 refills | Status: DC | PRN

## 2023-02-28 NOTE — Progress Notes (Signed)
Established Patient Office Visit  Subjective   Patient ID: Tyler Wilkinson, male    DOB: [birth year]  Age: 67 y.o. MRN: [id number]  Chief Complaint  Patient presents with   Follow-up    BP.   Feet pain   Tyler Wilkinson is here to follow-up for his diabetes.  He has recently retired and has been switching to [employer].  He just got a letter that his Medicare part a and B were approved however he has not yet signed up for part D and apparently has placed an application for Medicaid.  He is concerned about the cost of his medications.  I have been in discussion with Tyler Wilkinson our diabetes educator as well as our pharmacy tech about getting him needed medications he has a few OmniPod devices left will need to refill of insulin and Trulicity.  His other issues are recurrent major depressive disorder.  He is back to taking Wellbutrin which overall he feels like it helps however finds that it still a bit of a struggle to get out of bed and do anything.  He asked again about testosterone and what his last testosterone numbers were.  He still has weak erections desire is there sildenafil has helped.  For his BPH he notes continued weak stream and hesitancy, postvoid dribbling.  Has been taking Flomax 0.4 mg daily without relief did try going up to 0.8 mg without much relief either.     Objective:     BP 130/65 (BP Location: Right Arm, Patient Position: Sitting, Cuff Size: Large)   Pulse 78   Temp 97.6 F (36.4 C) (Oral)   Ht 6' (1.829 m)   Wt 221 lb 6.4 oz (100.4 kg)   SpO2 98% Comment: RA  BMI 30.03 kg/m  BP Readings from Last 3 Encounters:  02/27/23 130/65  02/03/23 (!) 140/59  10/24/22 132/63   Wt Readings from Last 3 Encounters:  02/27/23 221 lb 6.4 oz (100.4 kg)  02/03/23 223 lb 11.2 oz (101.5 kg)  10/24/22 218 lb 9.6 oz (99.2 kg)      Physical Exam Vitals and nursing note reviewed.  Constitutional:      Appearance: Normal appearance.  Cardiovascular:     Rate and Rhythm: Normal rate  and regular rhythm.  Pulmonary:     Effort: Pulmonary effort is normal.     Breath sounds: Normal breath sounds.  Musculoskeletal:     Right lower leg: No edema.     Left lower leg: No edema.  Neurological:     Mental Status: He is alert.  Psychiatric:        Mood and Affect: Mood normal.        Behavior: Behavior normal.      No results found for any visits on 02/27/23.  Last metabolic panel Lab Results  Component Value Date   GLUCOSE 132 (H) 05/09/2022   NA 139 05/09/2022   K 3.8 05/09/2022   CL 103 05/09/2022   CO2 23 05/09/2022   BUN 13 05/09/2022   CREATININE 1.02 05/09/2022   EGFR 81 05/09/2022   CALCIUM 9.5 05/09/2022   PROT 6.5 06/29/2020   ALBUMIN 4.2 11/22/2021   LABGLOB 2.2 06/29/2020   AGRATIO 2.0 06/29/2020   BILITOT 0.4 06/29/2020   ALKPHOS 106 06/29/2020   AST 28 06/29/2020   ALT 38 06/29/2020   ANIONGAP 9 04/10/2021   Last hemoglobin A1c Lab Results  Component Value Date   HGBA1C 8.5 (A) 02/03/2023  The 10-year ASCVD risk score (Arnett DK, et al., 2019) is: 29.7%    Assessment & Plan:   Problem List Items Addressed This Visit       Cardiovascular and Mediastinum   Essential hypertension - Primary (Chronic)    Blood pressure is well-controlled today at 130/65 continue amlodipine benazepril 10-40 mg daily      Relevant Medications   tadalafil (CIALIS) 5 MG tablet     Endocrine   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  Tyler Wilkinson wore the CGM for 14 days. The average reading was 185, % time in target was 47%, % time below target was 1%, and % time above target was. 52%.  I am mainly concerned about backsliding given these insurance issues.  We are trying to get assistance for his Trulicity so that he can continue his current dose.  Also want to make sure that he can continue on the OmniPod we discussed backup of subcutaneous insulin injections and notification if he  runs out of the OmniPod before insurance pharmacy benefits kick in.  He reports that he still has plenty of Jardiance and does not need a prescription for this at this time.       Relevant Medications   insulin aspart (NOVOLOG) 100 UNIT/ML injection     Genitourinary   Benign localized prostatic hyperplasia with lower urinary tract symptoms (LUTS) (Chronic)    His BPH symptoms are not adequately resolved despite Flomax at either the 0.4 mg or 0.8 mg dosages.  At this point I will have him continue Flomax at 0.4 mg daily and add Cialis 5 mg daily for BPH.        Other   Major depression, recurrent, chronic (HCC)    Encouraged him to remain on bupropion 300 mg daily and see how much effect we can get out of this medication.  We are we avoiding SSRIs at this time because of his sexual dysfunction issues.      ERECTILE DYSFUNCTION, ORGANIC    I discussed with him I do not think low testosterone is playing a role here I think depression can be planned but overall and that is good to see if that sildenafil did help however I want him to stop this now I want to try Cialis daily for his BPH symptoms.       Return in about 3 months (around 05/30/2023).    Tyler Rung, DO

## 2023-02-28 NOTE — Assessment & Plan Note (Signed)
Blood pressure is well-controlled today at 130/65 continue amlodipine benazepril 10-40 mg daily

## 2023-02-28 NOTE — Assessment & Plan Note (Signed)
Encouraged him to remain on bupropion 300 mg daily and see how much effect we can get out of this medication.  We are we avoiding SSRIs at this time because of his sexual dysfunction issues.

## 2023-02-28 NOTE — Assessment & Plan Note (Signed)
Downloaded his CGM.  [name] wore the CGM for 14 days. The average reading was 185, % time in target was 47%, % time below target was 1%, and % time above target was. 52%.  I am mainly concerned about backsliding given these insurance issues.  We are trying to get assistance for his Trulicity so that he can continue his current dose.  Also want to make sure that he can continue on the OmniPod we discussed backup of subcutaneous insulin injections and notification if he runs out of the OmniPod before insurance pharmacy benefits kick in.  He reports that he still has plenty of Jardiance and does not need a prescription for this at this time.

## 2023-02-28 NOTE — Assessment & Plan Note (Signed)
His BPH symptoms are not adequately resolved despite Flomax at either the 0.4 mg or 0.8 mg dosages.  At this point I will have him continue Flomax at 0.4 mg daily and add Cialis 5 mg daily for BPH.

## 2023-02-28 NOTE — Assessment & Plan Note (Signed)
I discussed with him I do not think low testosterone is playing a role here I think depression can be planned but overall and that is good to see if that sildenafil did help however I want him to stop this now I want to try Cialis daily for his BPH symptoms.

## 2023-03-05 ENCOUNTER — Telehealth: Payer: Self-pay | Admitting: Dietician

## 2023-03-07 ENCOUNTER — Other Ambulatory Visit: Payer: Self-pay

## 2023-03-07 DIAGNOSIS — E1143 Type 2 diabetes mellitus with diabetic autonomic (poly)neuropathy: Secondary | ICD-10-CM

## 2023-03-07 NOTE — Telephone Encounter (Signed)
Mr. Logan asks for Dexcom and Omnipod prescriptions to be sent to [facility]. Lorrine Kin are already in another note.  Samples of Dexcom G6 sensor left at front desk per his request. He also says the Dexcom CGM will not be covered but the Freestyle Libre 3 will be on his insurance that starts August 1.

## 2023-03-07 NOTE — Telephone Encounter (Signed)
Patient is also requesting a call.

## 2023-03-08 NOTE — Progress Notes (Unsigned)
03/10/23- 20 yoM former smoker for sleep evaluation courtesy of Dr Erlinda Hong with concern of OSA/ former CPAP. Medical problem list includes CAD/ MI, HTN, Allergic Rhinitis, Asthma, DM2/ gatroparesis, Hyperlipidemia, Depression,  NPSG 05/13/17 - AHI 46.2/ hr, desaturation to 80%,body weight 215lbs Epworth score-10 Body weight today-221 lbs -----Patient had sleep study in 2018, was on a cpap 2-3 years ago- dropped off when he got sick. Was working 3rd shift- still on 5AM-12 noon sleep schedule. Complains he doesn't get "solid" sleep. He says thought process not quite right since a concussion in past. Remembers sleeping better when he had CPAP. Hopes that better sleep using CPAP would help "how my mind works". 4-5 caffeinated sodas/ day. No sleep meds. No ENT surgery. Denies complex parasomnias. Aware of loud snoring.  Prior to Admission medications   Medication Sig Start Date End Date Taking? Authorizing Provider  albuterol (VENTOLIN HFA) 108 (90 Base) MCG/ACT inhaler Inhale 2 puffs into the lungs every 6 (six) hours as needed for wheezing or shortness of breath. 02/20/21  Yes Gust Rung, DO  amLODipine-benazepril (LOTREL) 10-40 MG capsule Take 1 capsule by mouth daily. 02/03/23 05/04/23 Yes Adron Bene, MD  Arginine 500 MG CAPS Take 500 mg by mouth daily.   Yes [provider]  aspirin EC 81 MG tablet Take 1 tablet (81 mg total) by mouth daily. 08/05/14  Yes Burns, Tinnie Gens, MD  Blood Glucose Monitoring Suppl (BAYER CONTOUR NEXT USB MONITOR) w/Device KIT Check blood sugar up to 6 times a day 05/08/16  Yes Hoffman, Marthenia Rolling, DO  buPROPion (WELLBUTRIN XL) 300 MG 24 hr tablet Take 1 tablet (300 mg total) by mouth daily. 02/03/23 01/29/24 Yes Adron Bene, MD  Continuous Blood Gluc Receiver (DEXCOM G6 RECEIVER) DEVI 1 each by Does not apply route 5 (five) times daily. 05/14/19  Yes Gust Rung, DO  Continuous Glucose Sensor (DEXCOM G6 SENSOR) MISC 1 each by Does not apply route as  directed. 03/10/23  Yes Gust Rung, DO  Continuous Glucose Transmitter (DEXCOM G6 TRANSMITTER) MISC 1 each by Does not apply route 5 (five) times daily. 11/21/22  Yes Dickie La, MD  COSOPT PF 22.3-6.8 MG/ML SOLN ophthalmic solution Place 1 drop into both eyes 2 (two) times daily.  11/23/16  Yes [provider]  Diclofenac Sodium (PENNSAID) 2 % SOLN Apply 2 g topically 2 (two) times daily as needed (to affected area). 12/15/19  Yes Tarry Kos, MD  DM-APAP-CPM (CORICIDIN HBP) 10-325-2 MG TABS Take 1 tablet by mouth every 6 (six) hours. 10/17/22  Yes Evlyn Kanner, MD  Dulaglutide (TRULICITY) 3 MG/0.5ML SOPN Inject 3 mg into the skin once a week. 02/03/23  Yes   empagliflozin (JARDIANCE) 10 MG TABS tablet Take 1 tablet (10 mg total) by mouth daily before breakfast. 10/24/22  Yes Hoffman, Erik C, DO  Flaxseed, Linseed, (FLAX SEED OIL PO) Take 1 tablet by mouth daily.    Yes [provider]  glucose blood (BAYER CONTOUR NEXT TEST) test strip Check blood sugar up to 6 times a day 02/20/21  Yes Gust Rung, DO  hydrochlorothiazide (HYDRODIURIL) 25 MG tablet Take 1 tablet (25 mg total) by mouth daily. 10/07/22  Yes Gust Rung, DO  insulin aspart (NOVOLOG) 100 UNIT/ML injection Use to fill omnipod insulin pump up to 90 units daily. 02/27/23  Yes Gust Rung, DO  Insulin Disposable Pump (OMNIPOD DASH PODS, GEN 4,) MISC Fill with Novolog insulin and apply new pod about every  2 days 03/10/23  Yes Gust Rung, DO  Insulin Syringe-Needle U-100 (INSULIN SYRINGE .5CC/31GX5/16") 31G X 5/16" 0.5 ML MISC Use to inject insulin 3 times a day Dx code 250.00 02/18/11  Yes Leodis Sias, MD  latanoprost (XALATAN) 0.005 % ophthalmic solution Place 1 drop into both eyes at bedtime. 06/01/11  Yes [provider]  levocetirizine (XYZAL) 5 MG tablet Take 1 tablet (5 mg total) by mouth every evening. 12/31/19  Yes Gust Rung, DO  meloxicam (MOBIC) 15 MG tablet Take 1 tablet (15  mg total) by mouth daily as needed for pain (knee). 10/24/22  Yes Gust Rung, DO  Misc. Devices (PULSE OXIMETER FOR FINGER) MISC Take pulse ox level in the morning and at night. If less than or equal to 94 please call our clinic. 04/10/21  Yes Marolyn Haller, MD  [facility] DELICA LANCETS 33G MISC Use to check blood sugars up to 4 times a day. Dx code:E11.65 08/08/15  Yes Gust Rung, DO  rosuvastatin (CRESTOR) 5 MG tablet Take 1 tablet (5 mg total) by mouth daily. 02/03/23 01/29/24 Yes Adron Bene, MD  tadalafil (CIALIS) 5 MG tablet Take 1 tablet (5 mg total) by mouth daily. 02/27/23  Yes Gust Rung, DO  tamsulosin (FLOMAX) 0.4 MG CAPS capsule Take 1 capsule (0.4 mg total) by mouth daily after breakfast. 05/09/22  Yes Gust Rung, DO  triamcinolone (NASACORT AQ) 55 MCG/ACT AERO nasal inhaler Place 1 spray into the nose 2 (two) times daily. 12/27/15  Yes Beather Arbour, MD  fluticasone (FLONASE) 50 MCG/ACT nasal spray Place 2 sprays into both nostrils daily. 05/21/17 06/04/17  Reymundo Poll, MD  fluticasone (FLONASE) 50 MCG/ACT nasal spray Place 1 spray into both nostrils daily.    [provider]  insulin aspart (NOVOLOG) 100 UNIT/ML injection Use to fill omnipod insulin pump to 90 Units daily. 12/14/22   Gust Rung, DO  sildenafil (VIAGRA) 100 MG tablet Take 1 tablet (100 mg total) by mouth as needed for erectile dysfunction. 02/03/23      Past Medical History:  Diagnosis Date   Asthma    CAD (coronary artery disease)    stent RCA 2006 (other residual disease). /  nuclear 2008  no ischemia, inferolateral scar.   Cataract    Chronic bronchitis (HCC)    "anytime I get sick it goes into bronchitis; probably get it q yr"   Daily headache    "just recently" (05/10/2014)   Dyslipidemia    Educated about COVID-19 virus infection 01/04/2019   Ejection fraction    EF 55%,echo, 2008, inferolateral hypokinesis,   Erectile dysfunction    Flu-like symptoms 09/30/2018    Gastroparesis    Hypertension    Myocardial infarction (HCC) 2006   Severe obstructive sleep apnea-hypopnea syndrome 04/15/2009   Sleep study 03/15/2009 AHI of 82.2/hr oxygen desaturation nadir of 77%, loud snoring. Did not tolerate CPAP due to mask.   Sprain of right ankle 07/10/2012   Tooth pain 05/23/2017   Type 2 diabetes mellitus with complications (HCC) dx'd 1972   Viral upper respiratory tract infection 05/21/2017   Vomiting 11/14/2016   Past Surgical History:  Procedure Laterality Date   CORONARY ANGIOPLASTY WITH STENT PLACEMENT  ~ 2006   "1"   LAPAROSCOPIC APPENDECTOMY N/A 11/16/2014   Procedure: APPENDECTOMY LAPAROSCOPIC;  Surgeon: Gaynelle Adu, MD;  Location: [facility] OR;  Service: General;  Laterality: N/A;   UMBILICAL HERNIA REPAIR N/A 11/16/2014   Procedure: OPEN PRIMARY UMBILICAL HERNIA  REPAIR ;  Surgeon: Gaynelle Adu, MD;  Location: [facility] OR;  Service: General;  Laterality: N/A;   Family History  Problem Relation Age of Onset   Diabetes Mother    Alcoholism Father    Social History   Socioeconomic History   Marital status: Single    Spouse name: Not on file   Number of children: Not on file   Years of education: 9   Highest education level: Not on file  Occupational History   Occupation: security guard in college    Employer: [employer]  Tobacco Use   Smoking status: Former    Current packs/day: 0.00    Types: Cigarettes    Start date: 11/14/1972    Quit date: 01/26/1973    Years since quitting: 50.1   Smokeless tobacco: Never  Vaping Use   Vaping status: Never Used  Substance and Sexual Activity   Alcohol use: Not Currently   Drug use: Not Currently    Types: Marijuana    Comment: Occasionally.   Sexual activity: Not on file  Other Topics Concern   Not on file  Social History Narrative   Right handed   One story home   Drinks caffeine   Social Determinants of Health   Financial Resource Strain: Not on file  Food Insecurity: No Food Insecurity  (09/05/2022)   Hunger Vital Sign    Worried About Running Out of Food in the Last Year: Never true    Ran Out of Food in the Last Year: Never true  Transportation Needs: Not on file  Physical Activity: Not on file  Stress: Not on file  Social Connections: Socially Isolated (09/05/2022)   Social Connection and Isolation Panel [NHANES]    Frequency of Communication with Friends and Family: More than three times a week    Frequency of Social Gatherings with Friends and Family: Twice a week    Attends Religious Services: Never    Database administrator or Organizations: No    Attends Banker Meetings: Never    Marital Status: Separated  Intimate Partner Violence: Not At Risk (09/05/2022)   Humiliation, Afraid, Rape, and Kick questionnaire    Fear of Current or Ex-Partner: No    Emotionally Abused: No    Physically Abused: No    Sexually Abused: No   ROS-see HPI   + = positive Constitutional:    weight loss, night sweats, fevers, chills, fatigue, lassitude. HEENT:    headaches, difficulty swallowing, tooth/dental problems, sore throat,       sneezing, itching, +ear ache, nasal congestion, post nasal drip, snoring CV:    chest pain, orthopnea, PND, swelling in lower extremities, anasarca,                                   dizziness, palpitations Resp:   shortness of breath with exertion or at rest.                productive cough,   non-productive cough, coughing up of blood.              change in color of mucus.  wheezing.   Skin:    rash or lesions. GI:  No-   heartburn, indigestion, abdominal pain, nausea, vomiting, diarrhea,                 change in bowel habits, loss of appetite GU: dysuria, change  in color of urine, no urgency or frequency.   flank pain. MS:   joint pain, stiffness, decreased range of motion, back pain. Neuro-     nothing unusual Psych:  change in mood or affect.  +depression or anxiety.   memory loss.  OBJ- Physical Exam General- Alert, Oriented,  Affect-appropriate, Distress- none acute Skin- rash-none, lesions- none, excoriation- none Lymphadenopathy- none Head- atraumatic            Eyes- Gross vision intact, PERRLA, conjunctivae and secretions clear            Ears- Hearing, canals-normal            Nose- Clear, no-Septal dev, mucus, polyps, erosion, perforation             Throat- Mallampati IV , mucosa clear , drainage- none, tonsils- atrophic, + partial Neck- flexible , trachea midline, no stridor , thyroid nl, carotid no bruit Chest - symmetrical excursion , unlabored           Heart/CV- RRR , no murmur , no gallop  , no rub, nl s1 s2                           - JVD- none , edema- none, stasis changes- none, varices- none           Lung- clear to P&A, wheeze- none, cough- none , dullness-none, rub- none           Chest wall-  Abd-  Br/ Gen/ Rectal- Not done, not indicated Extrem- cyanosis- none, clubbing, none, atrophy- none, strength- nl Neuro- grossly intact to observation

## 2023-03-10 ENCOUNTER — Other Ambulatory Visit: Payer: Self-pay

## 2023-03-10 ENCOUNTER — Encounter: Payer: Self-pay | Admitting: Internal Medicine

## 2023-03-10 ENCOUNTER — Ambulatory Visit (INDEPENDENT_AMBULATORY_CARE_PROVIDER_SITE_OTHER): Payer: Medicare Other | Admitting: Internal Medicine

## 2023-03-10 VITALS — BP 142/72 | HR 83 | Ht 72.0 in | Wt 216.0 lb

## 2023-03-10 DIAGNOSIS — G4733 Obstructive sleep apnea (adult) (pediatric): Secondary | ICD-10-CM | POA: Diagnosis not present

## 2023-03-10 DIAGNOSIS — J45901 Unspecified asthma with (acute) exacerbation: Secondary | ICD-10-CM | POA: Diagnosis not present

## 2023-03-10 MED ORDER — DEXCOM G6 SENSOR MISC
1.0000 | 12 refills | Status: AC
Start: 2023-03-10 — End: ?
  Filled 2023-03-10 – 2023-05-09 (×6): qty 3, 30d supply, fill #0

## 2023-03-10 MED ORDER — OMNIPOD DASH PODS (GEN 4) MISC
11 refills | Status: DC
Start: 2023-03-10 — End: 2023-03-28
  Filled 2023-03-10: qty 15, fill #0
  Filled 2023-03-20: qty 15, 30d supply, fill #0
  Filled 2023-03-28: qty 15, 30d supply, fill #1

## 2023-03-10 NOTE — Assessment & Plan Note (Signed)
He minimizes symptoms currently and denies wheezing or dyspnea affect sleep.

## 2023-03-10 NOTE — Assessment & Plan Note (Signed)
After lapse in therapy, updated sleep study required.  Plan- schedule sleep study

## 2023-03-10 NOTE — Telephone Encounter (Signed)
Dexcom G 6 Sensors 2 boxes  picked up by [facility] for patient.

## 2023-03-10 NOTE — Patient Instructions (Signed)
Order- schedule home sleep test     dx OSA  Please call us about 2 weeks after your sleep test for results and recommendations 

## 2023-03-11 ENCOUNTER — Other Ambulatory Visit: Payer: Self-pay

## 2023-03-13 ENCOUNTER — Other Ambulatory Visit: Payer: Self-pay

## 2023-03-14 ENCOUNTER — Other Ambulatory Visit: Payer: Self-pay

## 2023-03-19 ENCOUNTER — Other Ambulatory Visit: Payer: Self-pay

## 2023-03-20 ENCOUNTER — Other Ambulatory Visit: Payer: Self-pay

## 2023-03-24 ENCOUNTER — Other Ambulatory Visit: Payer: Self-pay

## 2023-03-25 ENCOUNTER — Institutional Professional Consult (permissible substitution): Payer: Self-pay | Admitting: Internal Medicine

## 2023-03-28 ENCOUNTER — Other Ambulatory Visit: Payer: Self-pay

## 2023-03-28 ENCOUNTER — Other Ambulatory Visit: Payer: Self-pay | Admitting: Internal Medicine

## 2023-03-28 DIAGNOSIS — E1143 Type 2 diabetes mellitus with diabetic autonomic (poly)neuropathy: Secondary | ICD-10-CM

## 2023-03-31 ENCOUNTER — Other Ambulatory Visit: Payer: Self-pay

## 2023-03-31 MED ORDER — OMNIPOD DASH PODS (GEN 4) MISC
11 refills | Status: DC
Start: 2023-03-31 — End: 2024-05-25
  Filled 2023-05-13: qty 15, 30d supply, fill #0
  Filled 2023-07-11 (×4): qty 15, 30d supply, fill #1
  Filled 2023-08-08: qty 15, 30d supply, fill #2
  Filled 2023-10-09: qty 15, 30d supply, fill #3
  Filled 2023-11-07 (×2): qty 15, 30d supply, fill #4
  Filled 2023-12-18: qty 15, 30d supply, fill #5
  Filled 2024-01-22: qty 15, 30d supply, fill #6
  Filled 2024-03-02: qty 15, 30d supply, fill #7

## 2023-03-31 NOTE — Telephone Encounter (Signed)
Pharmacy comment: patients insurance wants him to have freestlye libre device and sensors

## 2023-03-31 NOTE — Telephone Encounter (Signed)
Fyi our fax machine is down we are waiting for supplies.

## 2023-03-31 NOTE — Telephone Encounter (Signed)
Spoke with Dr. Mikey Bussing who wants Dredyn to stay on Dexcom if possible. Call to pharmacy, they will request a PA and send [place] he paperwork for it.

## 2023-04-01 ENCOUNTER — Other Ambulatory Visit: Payer: Self-pay

## 2023-04-01 ENCOUNTER — Telehealth: Payer: Self-pay

## 2023-04-01 NOTE — Telephone Encounter (Signed)
Prior Authorization for patient (Dexcom G6 Sensor) was submitted on cover my meds with last office notes and labs awaiting approval or denial.  KEY:[id number]

## 2023-04-02 ENCOUNTER — Other Ambulatory Visit: Payer: Self-pay

## 2023-04-02 NOTE — Telephone Encounter (Addendum)
Per Cover My Meds Your request has been denied This request was denied under your Medicare Part D benefit; however, coverage/payment for the requested drug(s) has been APPROVED under Medicare Part B as long as your doctor continues to prescribe the drug for you, and it continues to be safe and

## 2023-04-07 ENCOUNTER — Ambulatory Visit: Payer: PPO | Admitting: Podiatry

## 2023-04-07 ENCOUNTER — Ambulatory Visit (INDEPENDENT_AMBULATORY_CARE_PROVIDER_SITE_OTHER): Payer: PPO

## 2023-04-07 ENCOUNTER — Encounter: Payer: Self-pay | Admitting: Podiatry

## 2023-04-07 DIAGNOSIS — M76821 Posterior tibial tendinitis, right leg: Secondary | ICD-10-CM

## 2023-04-07 MED ORDER — DEXAMETHASONE SODIUM PHOSPHATE 120 MG/30ML IJ SOLN
4.0000 mg | Freq: Once | INTRAMUSCULAR | Status: AC
Start: 2023-04-07 — End: 2023-04-07
  Administered 2023-04-07: 4 mg via INTRA_ARTICULAR

## 2023-04-07 MED ORDER — TRIAMCINOLONE ACETONIDE 10 MG/ML IJ SUSP
2.5000 mg | Freq: Once | INTRAMUSCULAR | Status: AC
Start: 2023-04-07 — End: 2023-04-07
  Administered 2023-04-07: 2.5 mg via INTRA_ARTICULAR

## 2023-04-07 NOTE — Progress Notes (Signed)
  Subjective:  Patient ID: Tyler Wilkinson, male    DOB: 1955-11-04,   MRN: [id number]  No chief complaint on file.   67 y.o. male presents for concern of right foot pain that started yesterday. Relates it is in a similar area to his previous problem he saw me for last year . Denies any other pedal complaints. Denies n/v/f/c.   Past Medical History:  Diagnosis Date   Asthma    CAD (coronary artery disease)    stent RCA 2006 (other residual disease). /  nuclear 2008  no ischemia, inferolateral scar.   Cataract    Chronic bronchitis (HCC)    "anytime I get sick it goes into bronchitis; probably get it q yr"   Daily headache    "just recently" (05/10/2014)   Dyslipidemia    Educated about COVID-19 virus infection 01/04/2019   Ejection fraction    EF 55%,echo, 2008, inferolateral hypokinesis,   Erectile dysfunction    Flu-like symptoms 09/30/2018   Gastroparesis    Hypertension    Myocardial infarction (HCC) 2006   Severe obstructive sleep apnea-hypopnea syndrome 04/15/2009   Sleep study 03/15/2009 AHI of 82.2/hr oxygen desaturation nadir of 77%, loud snoring. Did not tolerate CPAP due to mask.   Sprain of right ankle 07/10/2012   Tooth pain 05/23/2017   Type 2 diabetes mellitus with complications (HCC) dx'd 1972   Viral upper respiratory tract infection 05/21/2017   Vomiting 11/14/2016    Objective:  Physical Exam: Vascular: DP/PT pulses 2/4 bilateral. CFT <3 seconds. Normal hair growth on digits. No edema.  Skin. No lacerations or abrasions bilateral feet.  Musculoskeletal: MMT 5/5 bilateral lower extremities in DF, PF, Inversion and Eversion. Deceased ROM in DF of ankle joint. Tender to PT tendon insertion. Pain with inversion and eversion PF and DF. Edema and erythema noted in thea rea  Neurological: Sensation intact to light touch.   Assessment:   1. Posterior tibial tendonitis, right      Plan:  Patient was evaluated and treated and all questions answered. X-rays reviewed  and discussed with patient. Discussed PTTD diagnosis vs possible gout flare and treatment options with patient. Stretching exercises discussed and handout dispensed. Continue meloxicam  Injection provided today procedure below.  CAM boot dispensed  Discussed if there is no improvement PT/MRI/injection may be an option. Patient to return to clinic in 3 weeks for recheck   Procedure: Injection Tendon/Ligament Discussed alternatives, risks, complications and verbal consent was obtained.  Location: Right PT tendon. Skin Prep: Alcohol. Injectate: 1cc 0.5% marcaine plain, 1 cc dexamethasone 0.5 cc kenalog  Disposition: Patient tolerated procedure well. Injection site dressed with a band-aid.  Post-injection care was discussed and return precautions discussed.    Louann Sjogren, DPM

## 2023-04-08 ENCOUNTER — Other Ambulatory Visit: Payer: Self-pay

## 2023-04-08 ENCOUNTER — Other Ambulatory Visit (HOSPITAL_COMMUNITY): Payer: Self-pay

## 2023-04-09 ENCOUNTER — Other Ambulatory Visit: Payer: Self-pay

## 2023-04-09 MED ORDER — LATANOPROST 0.005 % OP SOLN
1.0000 [drp] | Freq: Every day | OPHTHALMIC | 0 refills | Status: DC
  Filled 2023-04-09: qty 5, 50d supply, fill #0
  Filled 2023-04-25: qty 2.5, 25d supply, fill #0
  Filled 2023-06-13: qty 2.5, 25d supply, fill #1

## 2023-04-15 ENCOUNTER — Other Ambulatory Visit (HOSPITAL_BASED_OUTPATIENT_CLINIC_OR_DEPARTMENT_OTHER): Payer: Self-pay

## 2023-04-17 ENCOUNTER — Other Ambulatory Visit: Payer: Self-pay

## 2023-04-17 ENCOUNTER — Other Ambulatory Visit (HOSPITAL_COMMUNITY): Payer: Self-pay

## 2023-04-23 ENCOUNTER — Telehealth: Payer: Self-pay | Admitting: Dietician

## 2023-04-25 ENCOUNTER — Other Ambulatory Visit: Payer: Self-pay

## 2023-04-28 ENCOUNTER — Ambulatory Visit: Payer: PPO | Admitting: Podiatry

## 2023-05-01 NOTE — Telephone Encounter (Signed)
Unable to reach patient by phone . Will ask front office to transfer call to triage or take a message.

## 2023-05-01 NOTE — Telephone Encounter (Addendum)
Left voicemail for return call  

## 2023-05-07 ENCOUNTER — Ambulatory Visit (INDEPENDENT_AMBULATORY_CARE_PROVIDER_SITE_OTHER): Payer: PPO | Admitting: Podiatry

## 2023-05-07 DIAGNOSIS — M76821 Posterior tibial tendinitis, right leg: Secondary | ICD-10-CM | POA: Diagnosis not present

## 2023-05-07 DIAGNOSIS — M79675 Pain in left toe(s): Secondary | ICD-10-CM

## 2023-05-07 DIAGNOSIS — B351 Tinea unguium: Secondary | ICD-10-CM | POA: Diagnosis not present

## 2023-05-07 DIAGNOSIS — M79674 Pain in right toe(s): Secondary | ICD-10-CM | POA: Diagnosis not present

## 2023-05-07 DIAGNOSIS — E1143 Type 2 diabetes mellitus with diabetic autonomic (poly)neuropathy: Secondary | ICD-10-CM | POA: Diagnosis not present

## 2023-05-07 NOTE — Progress Notes (Signed)
Subjective:  Patient ID: Tyler Wilkinson, male    DOB: [birth year],   MRN: [id number]  Chief Complaint  Patient presents with   Tendinitis    PT tendinitis vs gout flare follow up    67 y.o. male presents for concern of right foot pain that started yesterday. Relates it is in a similar area to his previous problem he saw me for last year .concern of thickened elongated and painful nails that are difficult to trim. Requesting to have them trimmed today. Relates burning and tingling in their feet. Patient is diabetic and last A1c was  Lab Results  Component Value Date   HGBA1C 8.5 (A) 02/03/2023   .   PCP:  Gust Rung, DO    Denies any other pedal complaints. Denies n/v/f/c.   Past Medical History:  Diagnosis Date   Asthma    CAD (coronary artery disease)    stent RCA 2006 (other residual disease). /  nuclear 2008  no ischemia, inferolateral scar.   Cataract    Chronic bronchitis (HCC)    "anytime I get sick it goes into bronchitis; probably get it q yr"   Daily headache    "just recently" (05/10/2014)   Dyslipidemia    Educated about COVID-19 virus infection 01/04/2019   Ejection fraction    EF 55%,echo, 2008, inferolateral hypokinesis,   Erectile dysfunction    Flu-like symptoms 09/30/2018   Gastroparesis    Hypertension    Myocardial infarction (HCC) 2006   Severe obstructive sleep apnea-hypopnea syndrome 04/15/2009   Sleep study 03/15/2009 AHI of 82.2/hr oxygen desaturation nadir of 77%, loud snoring. Did not tolerate CPAP due to mask.   Sprain of right ankle 07/10/2012   Tooth pain 05/23/2017   Type 2 diabetes mellitus with complications (HCC) dx'd 1972   Viral upper respiratory tract infection 05/21/2017   Vomiting 11/14/2016    Objective:  Physical Exam: Vascular: DP/PT pulses 2/4 bilateral. CFT <3 seconds. Normal hair growth on digits. No edema.  Skin. No lacerations or abrasions bilateral feet. Nails 1-5 bilateral thicdkened and elongated Musculoskeletal: MMT 5/5  bilateral lower extremities in DF, PF, Inversion and Eversion. Deceased ROM in DF of ankle joint. Tender to PT tendon insertion. Pain with inversion and eversion PF and DF. Edema and erythema noted in thea rea  Neurological: Sensation intact to light touch. Protective sensation diminished.   Assessment:   1. Posterior tibial tendonitis, right   2. 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 Pain due to onychomycosis of toenails of both feet       Plan:  Patient was evaluated and treated and all questions answered. X-rays reviewed and discussed with patient. Discussed PTTD diagnosis vs possible gout flare and treatment options with patient. Stretching exercises discussed and handout dispensed. Continue meloxicam  Will refer to PT.  Discussed if there is no improvement PT/MRI/injection may be an option. -Discussed and educated patient on diabetic foot care, especially with  regards to the vascular, neurological and musculoskeletal systems.  -Stressed the importance of good glycemic control and the detriment of not  controlling glucose levels in relation to the foot. -Discussed supportive shoes at all times and checking feet regularly.  -Mechanically debrided all nails 1-5 bilateral using sterile nail nipper and filed with dremel without incident  -Answered all patient questions -Will get fitted for DM shoes.  -Patient to return  in 3 months for at risk foot care -Patient advised to call the office if any  problems or questions arise in the meantime.      Louann Sjogren, DPM

## 2023-05-07 NOTE — Patient Instructions (Signed)
Posterior Tibial Tendinitis Rehab Ask your health care provider which exercises are safe for you. Do exercises exactly as told by your provider and adjust them as directed. It's normal to feel mild stretching, pulling, tightness, or discomfort as you do these exercises. Stop right away if you feel sudden pain or your pain gets worse. Do not begin these exercises until told by your provider. Stretching and range-of-motion exercises These exercises warm up your muscles and joints and improve the movement and flexibility in your ankle and foot. These exercises may also help to relieve pain. Standing wall calf stretch, knee straight  Stand with your hands against a wall. Extend your left / right leg behind you, and bend your front knee slightly. If told, place a folded washcloth under the arch of your foot for support. Point the toes of your back foot slightly inward. Keeping your heels on the floor and your back knee straight, shift your weight toward the wall. Do not allow your back to arch. You should feel a gentle stretch in your upper left / right calf. Hold this position for __________ seconds. Repeat __________ times. Complete this exercise __________ times a day. Standing wall calf stretch, knee bent  Stand with your hands against a wall. Extend your left / right leg behind you, and bend your front knee slightly. If told, place a folded washcloth under the arch of your foot for support. Point the toes of your back foot slightly inward. Unlock your back knee so it's bent. Keep your heels on the floor. You should feel a gentle stretch deep in your lower left / right calf. Hold this position for __________ seconds. Repeat __________ times. Complete this exercise __________ times a day. Strengthening exercises These exercises build strength and endurance in your ankle and foot. Endurance is the ability to use your muscles for a long time, even after they get tired. Ankle inversion with  band  Secure one end of a rubber exercise band or tubing to a fixed object, such as a table leg or a pole, that will stay still when the band is pulled. Loop the other end of the band around the middle of your left / right foot. Sit on the floor facing the object with your left / right leg extended. The band or tube should be slightly tense when your foot is relaxed. Leading with your big toe, slowly bring your left / right foot and ankle inward, toward your other foot (inversion). Hold this position for __________ seconds. Slowly return your foot to the starting position. Repeat __________ times. Complete this exercise __________ times a day. Towel curls  Sit in a chair on a non-carpeted surface, and put your feet on the floor. Place a towel in front of your feet. If told by your provider, add a __________ weight to the end of the towel. Keeping your heel on the floor, put your left / right foot on the towel. Pull the towel toward you by grabbing the towel with your toes and curling them under. Keep your heel on the floor while you do this. Let your toes relax. Grab the towel with your toes again. Keep going until the towel is completely underneath your foot. Repeat __________ times. Complete this exercise __________ times a day. Balance exercise This exercise improves or maintains your balance. Balance is important in preventing falls. Single leg stand  Without wearing shoes, stand near a railing or in a doorway. You may hold on to the railing or  doorframe as needed for balance. Stand on your left / right foot. Keep your big toe down on the floor and try to keep your arch lifted. If balancing in this position is too easy, try the exercise with your eyes closed or while standing on a pillow. Hold this position for __________ seconds. Repeat __________ times. Complete this exercise __________ times a day. This information is not intended to replace advice given to you by your health care  provider. Make sure you discuss any questions you have with your health care provider. Document Revised: 08/07/2022 Document Reviewed: 08/07/2022 Elsevier Patient Education  2024 [employer].

## 2023-05-08 ENCOUNTER — Encounter: Payer: Self-pay | Admitting: Internal Medicine

## 2023-05-08 ENCOUNTER — Other Ambulatory Visit: Payer: Self-pay

## 2023-05-08 ENCOUNTER — Ambulatory Visit (INDEPENDENT_AMBULATORY_CARE_PROVIDER_SITE_OTHER): Payer: Self-pay | Admitting: Internal Medicine

## 2023-05-08 VITALS — BP 175/98 | HR 99 | Temp 98.0°F | Ht 72.0 in | Wt 221.8 lb

## 2023-05-08 VITALS — BP 177/85 | HR 88 | Temp 98.0°F | Ht 72.0 in | Wt 221.8 lb

## 2023-05-08 DIAGNOSIS — Z0000 Encounter for general adult medical examination without abnormal findings: Secondary | ICD-10-CM

## 2023-05-08 DIAGNOSIS — E1143 Type 2 diabetes mellitus with diabetic autonomic (poly)neuropathy: Secondary | ICD-10-CM | POA: Diagnosis not present

## 2023-05-08 DIAGNOSIS — J069 Acute upper respiratory infection, unspecified: Secondary | ICD-10-CM | POA: Diagnosis not present

## 2023-05-08 DIAGNOSIS — I10 Essential (primary) hypertension: Secondary | ICD-10-CM | POA: Diagnosis not present

## 2023-05-08 DIAGNOSIS — Z794 Long term (current) use of insulin: Secondary | ICD-10-CM

## 2023-05-08 DIAGNOSIS — R413 Other amnesia: Secondary | ICD-10-CM

## 2023-05-08 LAB — POCT GLYCOSYLATED HEMOGLOBIN (HGB A1C): Hemoglobin A1C: 9 % — AB (ref 4.0–5.6)

## 2023-05-08 LAB — GLUCOSE, CAPILLARY: Glucose-Capillary: 193 mg/dL — ABNORMAL HIGH (ref 70–99)

## 2023-05-08 MED ORDER — AMLODIPINE BESY-BENAZEPRIL HCL 10-40 MG PO CAPS
1.0000 | ORAL_CAPSULE | Freq: Every day | ORAL | 3 refills | Status: DC
  Filled 2023-11-07: qty 90, 90d supply, fill #0

## 2023-05-08 NOTE — Assessment & Plan Note (Signed)
He is afebrile today but had some overnight sweats last night.  Suspect he likely has a URI.  Obtain COVID and flu testing today advised rest and hydration.  He did well with Paxlovid last time he had COVID given his comorbidities we discussed if positive we will plan to represcribe paxlovid

## 2023-05-08 NOTE — Assessment & Plan Note (Signed)
Blood pressure has historically been well-controlled he notes not taking his blood pressure medication because he was late this morning getting an and not feeling well with what appears to be a upper respiratory infection no change to medication at this time.

## 2023-05-08 NOTE — Progress Notes (Signed)
Established Patient Office Visit  Subjective   Patient ID: Tyler Wilkinson, male    DOB: 1956/03/13  Age: 67 y.o. MRN: [id number]  Chief Complaint  Patient presents with   Check-up Visit    Dizzy, CP, unsteady gait, SHOB, H/A.   Abdelkarim was late arriving to his appointment he called ahead of time and we told him we would see him.  He still rushed down here a bit and was visibly short of breath.  However taking vitals I came in and saw him.  He was noted to be hypertensive but pulse ox was normal his respiratory rate decreased while he was sitting down and he maintained 100% O2 saturations.  He reports to me last night he did not feel too well he woke up with some sweating he has some ear fullness and a mild sore throat he has not had much of a cough.  He has some achiness about his lateral aspect of his chest which is tender when he palpates the area.  He has not had any known sick contacts.  He has had the initial COVID-vaccine series and has had COVID twice.  He feels he may be slightly dehydrated this morning due to the sweating last night.  He has not had his CGM due to cost his Social Security payment was cut but he borrowed some money from his daughter and plans to pay the $75 co-pay.     Objective:     BP (!) 177/85 (BP Location: Right Arm, Patient Position: Sitting, Cuff Size: Large)   Pulse 88   Temp 98 F (36.7 C) (Oral)   Ht 6' (1.829 m)   Wt 221 lb 12.8 oz (100.6 kg)   SpO2 100% Comment: RA  BMI 30.08 kg/m  BP Readings from Last 3 Encounters:  05/08/23 (!) 177/85  03/10/23 (!) 142/72  02/27/23 130/65   Wt Readings from Last 3 Encounters:  05/08/23 221 lb 12.8 oz (100.6 kg)  03/10/23 216 lb (98 kg)  02/27/23 221 lb 6.4 oz (100.4 kg)      Physical Exam Vitals and nursing note reviewed.  Constitutional:      Appearance: Normal appearance.  HENT:     Right Ear: Tympanic membrane and ear canal normal.     Left Ear: Tympanic membrane and ear canal normal.      Mouth/Throat:     Comments: Posterior oropharynx erythema Eyes:     Extraocular Movements: Extraocular movements intact.  Cardiovascular:     Rate and Rhythm: Normal rate and regular rhythm.  Pulmonary:     Effort: Pulmonary effort is normal.     Comments: Initially tachypneic however resolved with sitting and resting. Neurological:     Mental Status: He is alert.  Psychiatric:        Mood and Affect: Mood normal.        Behavior: Behavior normal.      Results for orders placed or performed in visit on 05/08/23  Glucose, capillary  Result Value Ref Range   Glucose-Capillary 193 (H) 70 - 99 mg/dL  POC Hbg O5D  Result Value Ref Range   Hemoglobin A1C 9.0 (A) 4.0 - 5.6 %   HbA1c POC (<> result, manual entry)     HbA1c, POC (prediabetic range)     HbA1c, POC (controlled diabetic range)      Last CBC Lab Results  Component Value Date   WBC 6.6 05/09/2022   HGB 15.1 05/09/2022   HCT 44.0 05/09/2022  MCV 85 05/09/2022   MCH 29.0 05/09/2022   RDW 13.2 05/09/2022   PLT 203 05/09/2022   Last metabolic panel Lab Results  Component Value Date   GLUCOSE 132 (H) 05/09/2022   NA 139 05/09/2022   K 3.8 05/09/2022   CL 103 05/09/2022   CO2 23 05/09/2022   BUN 13 05/09/2022   CREATININE 1.02 05/09/2022   EGFR 81 05/09/2022   CALCIUM 9.5 05/09/2022   PROT 6.5 06/29/2020   ALBUMIN 4.2 11/22/2021   LABGLOB 2.2 06/29/2020   AGRATIO 2.0 06/29/2020   BILITOT 0.4 06/29/2020   ALKPHOS 106 06/29/2020   AST 28 06/29/2020   ALT 38 06/29/2020   ANIONGAP 9 04/10/2021   Last lipids Lab Results  Component Value Date   CHOL 147 05/09/2022   HDL 38 (L) 05/09/2022   LDLCALC 92 05/09/2022   TRIG 92 05/09/2022   CHOLHDL 3.9 05/09/2022   Last hemoglobin A1c Lab Results  Component Value Date   HGBA1C 9.0 (A) 05/08/2023      The 10-year ASCVD risk score (Arnett DK, et al., 2019) is: 45.9%    Assessment & Plan:   Problem List Items Addressed This Visit        Cardiovascular and Mediastinum   Essential hypertension (Chronic)    Blood pressure has historically been well-controlled he notes not taking his blood pressure medication because he was late this morning getting an and not feeling well with what appears to be a upper respiratory infection no change to medication at this time.      Relevant Medications   amLODipine-benazepril (LOTREL) 10-40 MG capsule     Respiratory   Viral upper respiratory tract infection    He is afebrile today but had some overnight sweats last night.  Suspect he likely has a URI.  Obtain COVID and flu testing today advised rest and hydration.  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  Has been out of his CGM due to cost.  I will have him continue to work with Lupita Leash for global diabetes management and will not make any acute changes today.  Also list the help of our care coordination team to see if there is any additional resources that can be mustard to help him.      Relevant Medications   amLODipine-benazepril (LOTREL) 10-40 MG capsule   Other Relevant Orders   POC Hbg A1C (Completed)   Microalbumin / creatinine urine ratio   BMP8+Anion Gap   Lipid Profile   AMB Referral to Community Care Coordinaton ([facility] Patients)     Other   Memory change    He reports increased forgetfulness due to multiple acute and chronic complaints and that he was late I did not get to fully address this.  I would like to go ahead and get TSH and vitamin B12 levels along with a metabolic panel today.      Relevant Orders   Vitamin B12   TSH    Return in about 3 months (around 08/07/2023), or if symptoms  worsen or fail to improve.    Gust Rung, DO

## 2023-05-08 NOTE — Assessment & Plan Note (Signed)
He reports increased forgetfulness due to multiple acute and chronic complaints and that he was late I did not get to fully address this.  I would like to go ahead and get TSH and vitamin B12 levels along with a metabolic panel today.

## 2023-05-08 NOTE — Patient Instructions (Signed)
I will call you with the results.  I want you to get some rest and stay hydrated.  IF you have COVID I will call you in Paxlovid.

## 2023-05-08 NOTE — Assessment & Plan Note (Signed)
Blood glucose slightly elevated with check today.  Has been out of his CGM due to cost.  I will have him continue to work with Lupita Leash for global diabetes management and will not make any acute changes today.  Also list the help of our care coordination team to see if there is any additional resources that can be mustard to help him.

## 2023-05-09 ENCOUNTER — Other Ambulatory Visit (HOSPITAL_BASED_OUTPATIENT_CLINIC_OR_DEPARTMENT_OTHER): Payer: Self-pay

## 2023-05-09 ENCOUNTER — Other Ambulatory Visit: Payer: Self-pay

## 2023-05-09 LAB — LIPID PANEL

## 2023-05-10 LAB — LIPID PANEL
Cholesterol, Total: 152 mg/dL (ref 100–199)
HDL: 52 mg/dL (ref >39)
LDL CALC COMMENT:: 2.9 ratio (ref 0.0–5.0)
LDL Chol Calc (NIH): 87 mg/dL (ref 0–99)
Triglycerides: 65 mg/dL (ref 0–149)
VLDL Cholesterol Cal: 13 mg/dL (ref 5–40)

## 2023-05-10 LAB — BMP8+ANION GAP
Anion Gap: 16 mmol/L (ref 10.0–18.0)
BUN/Creatinine Ratio: 13 (ref 10–24)
BUN: 13 mg/dL (ref 8–27)
CO2: 20 mmol/L (ref 20–29)
Calcium: 9.5 mg/dL (ref 8.6–10.2)
Chloride: 101 mmol/L (ref 96–106)
Creatinine, Ser: 1.04 mg/dL (ref 0.76–1.27)
Glucose: 192 mg/dL — ABNORMAL HIGH (ref 70–99)
Potassium: 4 mmol/L (ref 3.5–5.2)
Sodium: 137 mmol/L (ref 134–144)
eGFR: 79 mL/min/{1.73_m2} (ref >59)

## 2023-05-10 LAB — MICROALBUMIN / CREATININE URINE RATIO
Creatinine, Urine: 123.8 mg/dL
Microalb/Creat Ratio: 66 mg/g creat — ABNORMAL HIGH (ref 0–29)
Microalbumin, Urine: 81.5 ug/mL

## 2023-05-10 LAB — VITAMIN B12: Vitamin B-12: 253 pg/mL (ref 232–1245)

## 2023-05-10 LAB — COVID-19, FLU A+B NAA
Influenza A, NAA: NOT DETECTED
Influenza B, NAA: NOT DETECTED
SARS-CoV-2, NAA: NOT DETECTED

## 2023-05-10 LAB — TSH: TSH: 2.92 u[IU]/mL (ref 0.450–4.500)

## 2023-05-12 ENCOUNTER — Telehealth: Payer: Self-pay

## 2023-05-12 ENCOUNTER — Other Ambulatory Visit: Payer: Self-pay

## 2023-05-12 NOTE — Telephone Encounter (Signed)
Prior Authorization for patient (Trulicity 3MG /0.5ML) came through on cover my meds was submitted with last office notes and labs awaiting approval or denial.  QIO:[id number]

## 2023-05-12 NOTE — Progress Notes (Signed)
This encounter was created in error - please disregard.

## 2023-05-12 NOTE — Progress Notes (Signed)
Care Guide Note  05/12/2023 Name: [name] MRN: [id number] DOB: February 18, 1956  Referred by: Gust Rung, DO Reason for referral : Care Coordination (Outreach to schedule with Pharm d )   Tyler Wilkinson is a 67 y.o. year old male who is a primary care patient of Gust Rung, DO. [name] was referred to the pharmacist for assistance related to DM.    Successful contact was made with the patient to discuss pharmacy services including being ready for the pharmacist to call at least 5 minutes before the scheduled appointment time, to have medication bottles and any blood sugar or blood pressure readings ready for review. The patient agreed to meet with the pharmacist via with the pharmacist via telephone visit on (date/time).  05/19/2023  Penne Lash, RMA Care Guide [facility]  [place], [place] [address] Direct Dial: [phone] [email or web address]

## 2023-05-13 ENCOUNTER — Other Ambulatory Visit: Payer: Self-pay

## 2023-05-13 NOTE — Telephone Encounter (Signed)
Decision:Approved Tyler Wilkinson (Key: Saul Fordyce) PA Case ID #: [id number] Need Help? Call us at [phone] Outcome Approved on September 23 by [facility] [employer] 2017 23-SEP-24:23-SEP-25 Trulicity 3MG /0.5ML [place] SOPN Quantity:3; Drug Trulicity 3MG /0.5ML pen-injectors ePA cloud logo Form [employer] [employer] Medicare Electronic Prior Authorization Form 2017 NCPDP

## 2023-05-16 ENCOUNTER — Other Ambulatory Visit: Payer: Self-pay

## 2023-05-19 ENCOUNTER — Other Ambulatory Visit: Payer: PPO | Admitting: Pharmacist

## 2023-05-19 ENCOUNTER — Telehealth: Payer: Self-pay | Admitting: Pharmacist

## 2023-05-19 NOTE — Progress Notes (Signed)
   05/19/2023 Name: Tyler Wilkinson MRN: [id number] DOB: 10/06/1955  Attempted to contact patient x2 for scheduled appointment for medication management. Left HIPAA compliant message for patient to return my call at their convenience.   Will reschedule same time tomorrow for a future attempt.  Lynnda Shields, PharmD, BCPS Clinical Pharmacist [facility]

## 2023-05-19 NOTE — Progress Notes (Unsigned)
   05/19/2023 Name: Tyler Wilkinson MRN: [id number] DOB: 1955/12/18  No chief complaint on file.   {Visit WUJW:11914}   Subjective:  Care Team: Primary Care Provider: Gust Rung, DO ; Next Scheduled Visit: *** {careteamprovider:27366}  Medication Access/Adherence  Current Pharmacy:  [facility] [address] ([address] [place] - [address] [place] [address] Phone: [phone] Fax: [phone]  [facility] [place] - [address] [place] [address] Phone: [phone] Fax: [phone]  [place] [facility] [address] [place] [address] Phone: [phone] Fax: [phone]  [facility] [address], [address] [place] [address] Phone: [phone] Fax: [phone]   Patient reports affordability concerns with their medications: {YES/NO:21197} Patient reports access/transportation concerns to their pharmacy: {YES/NO:21197} Patient reports adherence concerns with their medications:  {YES/NO:21197} ***   {Pharmacy S/O Choices:26420}   Objective:  Lab Results  Component Value Date   HGBA1C 9.0 (A) 05/08/2023    Lab Results  Component Value Date   CREATININE 1.04 05/08/2023   BUN 13 05/08/2023   NA 137 05/08/2023   K 4.0 05/08/2023   CL 101 05/08/2023   CO2 20 05/08/2023    Lab Results  Component Value Date   CHOL 152 05/08/2023   HDL 52 05/08/2023   LDLCALC 87 05/08/2023   TRIG 65 05/08/2023   CHOLHDL 2.9 05/08/2023    Medications Reviewed Today   Medications were not reviewed in this encounter       Assessment/Plan:   {Pharmacy A/P Choices:26421}  Follow Up Plan: ***  ***

## 2023-05-19 NOTE — Progress Notes (Signed)
05/19/2023 Name: WASIL WOLKE MRN: [id number] DOB: 1956/03/17  Chief Complaint  Patient presents with   Diabetes   Medication Assistance    DONNY HEFFERN is a 67 y.o. year old male who presented for a telephone visit.   They were referred to the pharmacist by their PCP for assistance in managing diabetes and medication access.   Recently retired May 2024, changed insurance from employer to [employer], has had a difficult transition with medication access.He presents with difficulty affording CGM, Dexcom G6.    Subjective:  Care Team: Primary Care Provider: Gust Rung, DO   Medication Access/Adherence  Current Pharmacy:  [facility] [address], [address] [place] [address] Phone: [phone] Fax: [phone]    Diabetes:  Current medications: trulicity 3mg  weekly, jardiance 10mg  daily, insulin pump with omnipod  Current glucose readings: Dexcom G6, difficulty affording, $75  Current medication access support: [facility]    Objective:  Lab Results  Component Value Date   HGBA1C 9.0 (A) 05/08/2023    Lab Results  Component Value Date   CREATININE 1.04 05/08/2023   BUN 13 05/08/2023   NA 137 05/08/2023   K 4.0 05/08/2023   CL 101 05/08/2023   CO2 20 05/08/2023    Lab Results  Component Value Date   CHOL 152 05/08/2023   HDL 52 05/08/2023   LDLCALC 87 05/08/2023   TRIG 65 05/08/2023   CHOLHDL 2.9 05/08/2023    Medications Reviewed Today     Reviewed by Gabriel Carina, RPH (Pharmacist) on 05/19/23 at 1537  Med List Status: <None>   Medication Order Taking? Sig Documenting Provider Last Dose Status Informant  albuterol (VENTOLIN HFA) 108 (90 Base) MCG/ACT inhaler [id number] Yes Inhale 2 puffs into the lungs every 6 (six) hours as needed for wheezing or shortness of breath. Gust Rung, DO Taking Active   amLODipine-benazepril (LOTREL) 10-40 MG capsule [id number] Yes Take 1  capsule by mouth daily. Gust Rung, DO Taking Active   Arginine 500 MG CAPS [id number] Yes Take 500 mg by mouth daily. [provider] Taking Active Self  aspirin EC 81 MG tablet [id number] Yes Take 1 tablet (81 mg total) by mouth daily. Servando Snare, MD Taking Active Self  Blood Glucose Monitoring Suppl (BAYER CONTOUR NEXT USB MONITOR) w/Device KIT [id number]  Check blood sugar up to 6 times a day Gust Rung, DO  Active Self  buPROPion (WELLBUTRIN XL) 300 MG 24 hr tablet [id number] Yes Take 1 tablet (300 mg total) by mouth daily. Adron Bene, MD Taking Active   Continuous Blood Gluc Receiver (DEXCOM G6 RECEIVER) DEVI [id number] Yes 1 each by Does not apply route 5 (five) times daily. Gust Rung, DO Taking Active Self  Continuous Glucose Sensor (DEXCOM G6 SENSOR) MISC [id number] Yes Use as directed. Gust Rung, DO Taking Active   Continuous Glucose Transmitter (DEXCOM G6 TRANSMITTER) MISC [id number] Yes 1 each by Does not apply route 5 (five) times daily. Dickie La, MD Taking Active   COSOPT PF 22.3-6.8 MG/ML SOLN ophthalmic solution [id number]  Place 1 drop into both eyes 2 (two) times daily.  [provider]  Active Self  Diclofenac Sodium (PENNSAID) 2 % SOLN [id number]  Apply 2 g topically 2 (two) times daily as needed (to affected area). Tarry Kos, MD  Active   DM-APAP-CPM (CORICIDIN HBP) 10-325-2 MG TABS [id number]  Take 1 tablet by mouth every 6 (  six) hours. Evlyn Kanner, MD  Active   Dulaglutide (TRULICITY) 3 MG/0.5ML Namon Cirri [id number] Yes Inject 3 mg into the skin once a week.  Taking Active   empagliflozin (JARDIANCE) 10 MG TABS tablet [id number]  Take 1 tablet (10 mg total) by mouth daily before breakfast. Gust Rung, DO  Active   Flaxseed, Linseed, (FLAX SEED OIL PO) [id number]  Take 1 tablet by mouth daily.  [provider]  Active Self  fluticasone (FLONASE) 50 MCG/ACT nasal spray [id number]  Place 2 sprays into both nostrils daily.  Reymundo Poll, MD  Expired 06/04/17 2359   glucose blood (BAYER CONTOUR NEXT TEST) test strip [id number]  Check blood sugar up to 6 times a day Gust Rung, DO  Active   hydrochlorothiazide (HYDRODIURIL) 25 MG tablet [id number]  Take 1 tablet (25 mg total) by mouth daily. Gust Rung, DO  Active   insulin aspart (NOVOLOG) 100 UNIT/ML injection [id number]  Use to fill omnipod insulin pump up to 90 units daily. Gust Rung, DO  Active   Insulin Disposable Pump (OMNIPOD DASH PODS, GEN 4,) MISC [id number]  Fill with Novolog insulin and apply new pod about every 2 days Gust Rung, DO  Active   Insulin Syringe-Needle U-100 (INSULIN SYRINGE .5CC/31GX5/16") 31G X 5/16" 0.5 ML MISC [id number]  Use to inject insulin 3 times a day Dx code 250.00 Leodis Sias, MD  Active Self  latanoprost (XALATAN) 0.005 % ophthalmic solution [id number]  Place 1 drop into both eyes at bedtime. [provider]  Active Self  latanoprost (XALATAN) 0.005 % ophthalmic solution [id number]  Place 1 drop into both eyes daily. Olivia Canter, MD  Active   levocetirizine (XYZAL) 5 MG tablet [id number]  Take 1 tablet (5 mg total) by mouth every evening. Gust Rung, DO  Active   meloxicam (MOBIC) 15 MG tablet [id number]  Take 1 tablet (15 mg total) by mouth daily as needed for pain (knee). Gust Rung, DO  Active   Misc. Devices (PULSE OXIMETER FOR FINGER) MISC [id number]  Take pulse ox level in the morning and at night. If less than or equal to 94 please call our clinic. Marolyn Haller, MD  Active   [facility] LANCETS [address] [place] [id number]  Use to check blood sugars up to 4 times a day. Dx code:E11.65 Gust Rung, DO  Active Self  rosuvastatin (CRESTOR) 5 MG tablet [id number]  Take 1 tablet (5 mg total) by mouth daily. Adron Bene, MD  Active   tadalafil (CIALIS) 5 MG tablet [id number]  Take 1 tablet (5 mg total) by mouth daily. Gust Rung, DO  Active   tamsulosin (FLOMAX) 0.4 MG CAPS  capsule [id number]  Take 1 capsule (0.4 mg total) by mouth daily after breakfast. Gust Rung, DO  Active   triamcinolone (NASACORT AQ) 55 MCG/ACT AERO nasal inhaler [id number]  Place 1 spray into the nose 2 (two) times daily. Beather Arbour, MD  Active Self  Med List Note Rondall Allegra [facility] 04/17/23 1226): Do not bill IM program, this patient has insurance!              Assessment/Plan:   Diabetes: - Currently uncontrolled - Recommend to stop dexcom, initiate Libre 3+ which will avoid cost burden (covered on insurance formulary), also will be integrated with omnipod pump in future, but patient is not currently using the dexcom integration to omnipod anyway, so he is primarily seeking a lower cost between the two. -  Recommend to check glucose continuously via CGM  Follow Up Plan: 1-2 weeks to confirm CGM obtained.  Lynnda Shields, PharmD, BCPS Clinical Pharmacist [facility]

## 2023-05-20 ENCOUNTER — Other Ambulatory Visit: Payer: PPO | Admitting: Pharmacist

## 2023-05-20 ENCOUNTER — Other Ambulatory Visit: Payer: Self-pay

## 2023-05-22 ENCOUNTER — Other Ambulatory Visit: Payer: Self-pay

## 2023-05-22 ENCOUNTER — Other Ambulatory Visit: Payer: Self-pay | Admitting: Internal Medicine

## 2023-05-22 ENCOUNTER — Other Ambulatory Visit: Payer: PPO | Admitting: Pharmacist

## 2023-05-22 MED ORDER — FREESTYLE LIBRE 3 READER DEVI
1.0000 | Freq: Every day | 3 refills | Status: AC
  Filled 2023-05-22 – 2023-06-16 (×2): qty 1, 30d supply, fill #0
  Filled 2023-07-29 – 2023-11-07 (×2): qty 1, 30d supply, fill #1
  Filled 2024-04-23: qty 1, 30d supply, fill #2

## 2023-05-22 MED ORDER — FREESTYLE LIBRE 3 SENSOR MISC
1.0000 | Freq: Every day | 3 refills | Status: DC
  Filled 2023-05-22 – 2023-06-13 (×3): qty 2, 28d supply, fill #0
  Filled 2023-07-11: qty 2, 28d supply, fill #1
  Filled 2023-08-08: qty 2, 28d supply, fill #2
  Filled 2023-11-07: qty 2, 28d supply, fill #3

## 2023-05-22 NOTE — Progress Notes (Signed)
05/22/2023 Name: Tyler Wilkinson MRN: [id number] DOB: Dec 09, 1955  Chief Complaint  Patient presents with   Medication Assistance    Tyler Wilkinson is a 67 y.o. year old male who presented for a telephone visit.   They were referred to the pharmacist by their PCP for assistance in managing diabetes and medication access.   Recently retired May 2024, changed insurance from employer to [employer], has had a difficult transition with medication access.He presents with difficulty affording CGM, Dexcom G6. We have arranged for him to switch to Sherrill 3+ with Josephine Igo 3 reader device, covered at no cost by insurance.   Subjective:  Care Team: Primary Care Provider: Gust Rung, DO   Medication Access/Adherence  Current Pharmacy:  [facility] [address], [address] [place] [address] Phone: [phone] Fax: [phone]    Diabetes:  Current medications: trulicity 3mg  weekly, jardiance 10mg  daily, insulin pump with omnipod  Current glucose readings: Dexcom G6, difficulty affording, he does not use the integration features with insulin pump anyways, so is seeking a lower cost alternative  Current medication access support: [facility]    Objective:  Lab Results  Component Value Date   HGBA1C 9.0 (A) 05/08/2023    Lab Results  Component Value Date   CREATININE 1.04 05/08/2023   BUN 13 05/08/2023   NA 137 05/08/2023   K 4.0 05/08/2023   CL 101 05/08/2023   CO2 20 05/08/2023    Lab Results  Component Value Date   CHOL 152 05/08/2023   HDL 52 05/08/2023   LDLCALC 87 05/08/2023   TRIG 65 05/08/2023   CHOLHDL 2.9 05/08/2023    Medications Reviewed Today     Reviewed by Gabriel Carina, RPH (Pharmacist) on 05/22/23 at 1745  Med List Status: <None>   Medication Order Taking? Sig Documenting Provider Last Dose Status Informant  albuterol (VENTOLIN HFA) 108 (90 Base) MCG/ACT inhaler [id number] No Inhale  2 puffs into the lungs every 6 (six) hours as needed for wheezing or shortness of breath. Gust Rung, DO Taking Active   amLODipine-benazepril (LOTREL) 10-40 MG capsule [id number] No Take 1 capsule by mouth daily. Gust Rung, DO Taking Active   Arginine 500 MG CAPS [id number] No Take 500 mg by mouth daily. [provider] Taking Active Self  aspirin EC 81 MG tablet [id number] No Take 1 tablet (81 mg total) by mouth daily. Servando Snare, MD Taking Active Self  Blood Glucose Monitoring Suppl (BAYER CONTOUR NEXT USB MONITOR) w/Device KIT [id number] No Check blood sugar up to 6 times a day Gust Rung, DO Taking Active Self  buPROPion (WELLBUTRIN XL) 300 MG 24 hr tablet [id number] No Take 1 tablet (300 mg total) by mouth daily. Adron Bene, MD Taking Active   Continuous Blood Gluc Receiver (DEXCOM G6 RECEIVER) DEVI [id number] No 1 each by Does not apply route 5 (five) times daily. Gust Rung, DO Taking Active Self  Continuous Glucose Receiver (FREESTYLE LIBRE 3 READER) DEVI [id number]  use as directed Mercie Eon, MD  Active   Continuous Glucose Sensor (FREESTYLE LIBRE 3 SENSOR) [place] [id number]  Place 1 sensor on the skin every 14 days. Use to check glucose continuously Mercie Eon, MD  Active   Continuous Glucose Transmitter (DEXCOM G6 TRANSMITTER) MISC [id number] No 1 each by Does not apply route 5 (five) times daily. Dickie La, MD Taking Active   COSOPT PF 22.3-6.8 MG/ML SOLN ophthalmic  solution [id number] No Place 1 drop into both eyes 2 (two) times daily.  [provider] Taking Active Self  Diclofenac Sodium (PENNSAID) 2 % SOLN [id number] No Apply 2 g topically 2 (two) times daily as needed (to affected area). Tarry Kos, MD Taking Active   DM-APAP-CPM (CORICIDIN HBP) 10-325-2 MG TABS [id number] No Take 1 tablet by mouth every 6 (six) hours. Evlyn Kanner, MD Taking Active   Dulaglutide (TRULICITY) 3 MG/0.5ML SOPN [id number] No Inject 3 mg into the skin once a  week.  Taking Active   empagliflozin (JARDIANCE) 10 MG TABS tablet [id number] No Take 1 tablet (10 mg total) by mouth daily before breakfast. Gust Rung, DO Taking Active   Flaxseed, Linseed, (FLAX SEED OIL PO) [id number] No Take 1 tablet by mouth daily.  [provider] Taking Active Self  fluticasone (FLONASE) 50 MCG/ACT nasal spray [id number] No Place 2 sprays into both nostrils daily. Reymundo Poll, MD Taking Expired 06/04/17 2359   glucose blood (BAYER CONTOUR NEXT TEST) test strip [id number] No Check blood sugar up to 6 times a day Gust Rung, DO Taking Active   hydrochlorothiazide (HYDRODIURIL) 25 MG tablet [id number] No Take 1 tablet (25 mg total) by mouth daily. Gust Rung, DO Taking Active   insulin aspart (NOVOLOG) 100 UNIT/ML injection [id number] No Use to fill omnipod insulin pump up to 90 units daily. Gust Rung, DO Taking Active   Insulin Disposable Pump (OMNIPOD DASH PODS, GEN 4,) MISC [id number] No Fill with Novolog insulin and apply new pod about every 2 days Gust Rung, DO Taking Active   Insulin Syringe-Needle U-100 (INSULIN SYRINGE .5CC/31GX5/16") 31G X 5/16" 0.5 ML MISC [id number] No Use to inject insulin 3 times a day Dx code 250.00 Leodis Sias, MD Taking Active Self  latanoprost (XALATAN) 0.005 % ophthalmic solution [id number] No Place 1 drop into both eyes at bedtime. [provider] Taking Active Self  latanoprost (XALATAN) 0.005 % ophthalmic solution [id number] No Place 1 drop into both eyes daily. Olivia Canter, MD Taking Active   levocetirizine (XYZAL) 5 MG tablet [id number] No Take 1 tablet (5 mg total) by mouth every evening. Gust Rung, DO Taking Active   meloxicam (MOBIC) 15 MG tablet [id number] No Take 1 tablet (15 mg total) by mouth daily as needed for pain (knee). Gust Rung, DO Taking Active   Misc. Devices (PULSE OXIMETER FOR FINGER) MISC [id number] No Take pulse ox level in the morning and at night. If less  than or equal to 94 please call our clinic. Marolyn Haller, MD Taking Active   [facility] LANCETS 33G [place] [id number] No Use to check blood sugars up to 4 times a day. Dx code:E11.65 Gust Rung, DO Taking Active Self  rosuvastatin (CRESTOR) 5 MG tablet [id number] No Take 1 tablet (5 mg total) by mouth daily. Adron Bene, MD Taking Active   tadalafil (CIALIS) 5 MG tablet [id number] No Take 1 tablet (5 mg total) by mouth daily. Gust Rung, DO Taking Active   tamsulosin (FLOMAX) 0.4 MG CAPS capsule [id number] No Take 1 capsule (0.4 mg total) by mouth daily after breakfast. Gust Rung, DO Taking Active   triamcinolone (NASACORT AQ) 55 MCG/ACT AERO nasal inhaler [id number] No Place 1 spray into the nose 2 (two) times daily. Beather Arbour, MD Taking Active Self  Med List Note Rondall Allegra [facility] 04/17/23 1226): Do not bill IM program, this patient has insurance!  Assessment/Plan:   Diabetes: - Currently uncontrolled - Libre 3+ and Libre 3 (compatible reader device) sent to pharmacy and confirmed no cost for patient, advised him to pick it up, he is thrilled and will do so.  - Recommend to check glucose continuously via CGM   Follow Up Plan: none scheduled, CGM access resolved  Lynnda Shields, PharmD, BCPS Clinical Pharmacist [facility]

## 2023-05-22 NOTE — Progress Notes (Signed)
Can you please discontinue Dexcom G6 sensors, and send RX to [facility] at [facility] for:  [employer] 3 reader device  - Freestyle libre 3+ sensors

## 2023-05-29 ENCOUNTER — Ambulatory Visit: Payer: PPO

## 2023-06-02 ENCOUNTER — Other Ambulatory Visit: Payer: PPO | Admitting: Pharmacist

## 2023-06-03 ENCOUNTER — Other Ambulatory Visit: Payer: Self-pay

## 2023-06-08 NOTE — Progress Notes (Signed)
03/10/23- 38 yoM former smoker for sleep evaluation courtesy of Dr Erlinda Hong with concern of OSA/ former CPAP. Medical problem list includes CAD/ MI, HTN, Allergic Rhinitis, Asthma, DM2/ gatroparesis, Hyperlipidemia, Depression,  NPSG 05/13/17 - AHI 46.2/ hr, desaturation to 80%,body weight 215lbs Epworth score-10 Body weight today-221 lbs -----Patient had sleep study in 2018, was on a cpap 2-3 years ago- dropped off when he got sick. Was working 3rd shift- still on 5AM-12 noon sleep schedule. Complains he doesn't get "solid" sleep. He says thought process not quite right since a concussion in past. Remembers sleeping better when he had CPAP. Hopes that better sleep using CPAP would help "how my mind works". 4-5 caffeinated sodas/ day. No sleep meds. No ENT surgery. Denies complex parasomnias. Aware of loud snoring.  06/10/23- 67 yoM former smoker followed for OSA, complicated by CAD/ MI, HTN, Allergic Rhinitis, Asthma, DM2/ gatroparesis, Hyperlipidemia, Depression, Concussion,Glaucoma,  - ventolin hfa,  HST ordered 03/10/23- not done  ?finances Body weight today-223 lbs Flu vax senior senior Sleeps best 5AM-12 noon. Now retired, admits he is bored and depressed. Discussed options. He is willing to try again with sleep study. Also discussed sleep hygiene.  ROS-see HPI   + = positive Constitutional:    weight loss, night sweats, fevers, chills, fatigue, lassitude. HEENT:    headaches, difficulty swallowing, tooth/dental problems, sore throat,       sneezing, itching, +ear ache, nasal congestion, post nasal drip, snoring CV:    chest pain, orthopnea, PND, swelling in lower extremities, anasarca,                                   dizziness, palpitations Resp:   shortness of breath with exertion or at rest.                productive cough,   non-productive cough, coughing up of blood.              change in color of mucus.  wheezing.   Skin:    rash or lesions. GI:  No-   heartburn,  indigestion, abdominal pain, nausea, vomiting, diarrhea,                 change in bowel habits, loss of appetite GU: dysuria, change in color of urine, no urgency or frequency.   flank pain. MS:   joint pain, stiffness, decreased range of motion, back pain. Neuro-     nothing unusual Psych:  change in mood or affect.  +depression or anxiety.   memory loss.  OBJ- Physical Exam General- Alert, Oriented, Affect-appropriate, Distress- none acute Skin- rash-none, lesions- none, excoriation- none Lymphadenopathy- none Head- atraumatic            Eyes- Gross vision intact, PERRLA, conjunctivae and secretions clear            Ears- Hearing, canals-normal            Nose- Clear, no-Septal dev, mucus, polyps, erosion, perforation             Throat- Mallampati IV , mucosa clear , drainage- none, tonsils- atrophic, + partial Neck- flexible , trachea midline, no stridor , thyroid nl, carotid no bruit Chest - symmetrical excursion , unlabored           Heart/CV- RRR , no murmur , no gallop  , no rub, nl s1 s2                           -  JVD- none , edema- none, stasis changes- none, varices- none           Lung- clear to P&A, wheeze- none, cough- none , dullness-none, rub- none           Chest wall-  Abd-  Br/ Gen/ Rectal- Not done, not indicated Extrem- cyanosis- none, clubbing, none, atrophy- none, strength- nl Neuro- grossly intact to observation

## 2023-06-10 ENCOUNTER — Ambulatory Visit: Payer: PPO | Admitting: Internal Medicine

## 2023-06-10 ENCOUNTER — Encounter: Payer: Self-pay | Admitting: Internal Medicine

## 2023-06-10 VITALS — BP 124/62 | HR 79 | Ht 72.0 in | Wt 223.6 lb

## 2023-06-10 DIAGNOSIS — J4530 Mild persistent asthma, uncomplicated: Secondary | ICD-10-CM | POA: Diagnosis not present

## 2023-06-10 DIAGNOSIS — G4733 Obstructive sleep apnea (adult) (pediatric): Secondary | ICD-10-CM | POA: Diagnosis not present

## 2023-06-10 DIAGNOSIS — Z23 Encounter for immunization: Secondary | ICD-10-CM

## 2023-06-10 NOTE — Patient Instructions (Signed)
Order- [facility]- reorder- sleep study either HST or split night study, whichever his insurance will cover best dx OSA  Please call us about 2 weeks after your sleep test, for result and recommendations

## 2023-06-13 ENCOUNTER — Other Ambulatory Visit: Payer: Self-pay | Admitting: Internal Medicine

## 2023-06-13 ENCOUNTER — Other Ambulatory Visit: Payer: Self-pay

## 2023-06-16 ENCOUNTER — Other Ambulatory Visit: Payer: Self-pay

## 2023-06-16 ENCOUNTER — Other Ambulatory Visit: Payer: PPO | Admitting: Pharmacist

## 2023-06-16 MED ORDER — TRULICITY 3 MG/0.5ML ~~LOC~~ SOAJ
3.0000 mg | SUBCUTANEOUS | 0 refills | Status: DC
  Filled 2023-06-16: qty 4, 56d supply, fill #0

## 2023-06-16 NOTE — Progress Notes (Signed)
   06/16/2023 Name: ZAKKAI ZAPPA MRN: [id number] DOB: 1956-07-27  Chief Complaint  Patient presents with   Medication Assistance    Care Coordination  OZZIE HINO is a 67 y.o. year old male who presented for a telephone visit.    S/O: Responded to patient's incoming call requesting assistance with freestyle libre 3. Patient was unsure if it needed a reader device. Explained to patient that a reader device prescription was also submitted with his prescription for the sensors, and should be available at the pharmacy.   Answered patients questions about differences between [place] and dexcom.  A/P: - Recommend patient pick up reader device at pharmacy, can ask the pharmacy for assistance with use  Follow-up: none scheduled, patient can contact with further questions or concerns   Lynnda Shields, PharmD, BCPS Clinical Pharmacist [facility]

## 2023-06-17 ENCOUNTER — Telehealth: Payer: Self-pay | Admitting: Dietician

## 2023-06-17 NOTE — Telephone Encounter (Signed)
Tyler Wilkinson called to discuss the Freestyle libre 3 CGM and if he needs help applying the sensor, he will call.

## 2023-06-18 ENCOUNTER — Other Ambulatory Visit: Payer: Self-pay

## 2023-06-18 DIAGNOSIS — N401 Enlarged prostate with lower urinary tract symptoms: Secondary | ICD-10-CM

## 2023-06-18 MED ORDER — TAMSULOSIN HCL 0.4 MG PO CAPS
0.4000 mg | ORAL_CAPSULE | Freq: Every day | ORAL | 1 refills | Status: DC
  Filled 2023-06-26: qty 90, 90d supply, fill #0
  Filled 2023-09-04: qty 90, 90d supply, fill #1

## 2023-06-26 ENCOUNTER — Other Ambulatory Visit: Payer: Self-pay

## 2023-06-26 DIAGNOSIS — H40053 Ocular hypertension, bilateral: Secondary | ICD-10-CM | POA: Diagnosis not present

## 2023-06-26 MED ORDER — ZOSTER VAC RECOMB ADJUVANTED 50 MCG/0.5ML IM SUSR
0.5000 mL | Freq: Once | INTRAMUSCULAR | 0 refills | Status: AC
Start: 2023-06-26 — End: 2023-06-27
  Filled 2023-06-26: qty 0.5, 1d supply, fill #0

## 2023-07-06 ENCOUNTER — Encounter: Payer: Self-pay | Admitting: Internal Medicine

## 2023-07-06 DIAGNOSIS — J4530 Mild persistent asthma, uncomplicated: Secondary | ICD-10-CM | POA: Insufficient documentation

## 2023-07-06 NOTE — Assessment & Plan Note (Signed)
Currently uncomplicated Plan- watch sufficiency of rescue inhaler

## 2023-07-06 NOTE — Assessment & Plan Note (Signed)
He agrees to retry sleep study so we can try to help Meanwhile, keep weight down, sleep off back, good seep habit.

## 2023-07-11 ENCOUNTER — Other Ambulatory Visit: Payer: Self-pay

## 2023-07-11 ENCOUNTER — Other Ambulatory Visit (HOSPITAL_COMMUNITY): Payer: Self-pay

## 2023-07-14 ENCOUNTER — Other Ambulatory Visit: Payer: Self-pay

## 2023-07-29 ENCOUNTER — Other Ambulatory Visit: Payer: Self-pay | Admitting: Internal Medicine

## 2023-07-29 ENCOUNTER — Other Ambulatory Visit (HOSPITAL_COMMUNITY): Payer: Self-pay

## 2023-07-29 ENCOUNTER — Other Ambulatory Visit: Payer: Self-pay

## 2023-07-29 MED ORDER — TRULICITY 3 MG/0.5ML ~~LOC~~ SOAJ
3.0000 mg | SUBCUTANEOUS | 0 refills | Status: DC
  Filled 2023-07-29 – 2023-08-08 (×2): qty 4, 56d supply, fill #0

## 2023-08-06 ENCOUNTER — Ambulatory Visit (INDEPENDENT_AMBULATORY_CARE_PROVIDER_SITE_OTHER): Payer: PPO | Admitting: Podiatry

## 2023-08-06 DIAGNOSIS — Z91199 Patient's noncompliance with other medical treatment and regimen due to unspecified reason: Secondary | ICD-10-CM

## 2023-08-06 NOTE — Progress Notes (Signed)
No show

## 2023-08-07 ENCOUNTER — Encounter: Payer: PPO | Admitting: Internal Medicine

## 2023-08-08 ENCOUNTER — Other Ambulatory Visit: Payer: Self-pay

## 2023-08-11 ENCOUNTER — Other Ambulatory Visit: Payer: Self-pay

## 2023-08-12 ENCOUNTER — Other Ambulatory Visit: Payer: Self-pay

## 2023-08-14 ENCOUNTER — Other Ambulatory Visit: Payer: Self-pay

## 2023-08-22 ENCOUNTER — Other Ambulatory Visit: Payer: Self-pay

## 2023-08-22 ENCOUNTER — Telehealth: Payer: Self-pay | Admitting: Podiatry

## 2023-08-22 NOTE — Telephone Encounter (Signed)
 Pt called checking on diabetic shoes. I did not see anything in system. Please call pt with information

## 2023-08-26 ENCOUNTER — Other Ambulatory Visit: Payer: Self-pay

## 2023-08-28 ENCOUNTER — Encounter: Payer: PPO | Admitting: Student

## 2023-08-28 NOTE — Progress Notes (Deleted)
   Established Patient Office Visit  Subjective   Patient ID: Tyler Wilkinson, male    DOB: [birth year]  Age: 68 y.o. MRN: [id number]  No chief complaint on file.   HPI  {History (Optional):23778}  ROS    Objective:     There were no vitals taken for this visit. {Vitals History (Optional):23777}  Physical Exam   No results found for any visits on 08/28/23.  {Labs (Optional):23779}  The 10-year ASCVD risk score (Arnett DK, et al., 2019) is: 24.3%    Assessment & Plan:  Hypertension BP Readings from Last 3 Encounters:  06/10/23 124/62  05/09/23 (!) 175/98  05/08/23 (!) 177/85    Patient is currently taking amlodipine -benazepril  10 to 40 mg capsule daily  Type 2 diabetes Lab Results  Component Value Date   HGBA1C 9.0 (A) 05/08/2023    .lasta  Memory changes Per the last office visit, he had concerns about increased forgetfulness.  TSH and B12 were ordered.  Problem List Items Addressed This Visit   None   No follow-ups on file.    Toma Edwards, DO

## 2023-09-04 ENCOUNTER — Other Ambulatory Visit: Payer: Self-pay

## 2023-09-04 ENCOUNTER — Telehealth: Payer: Self-pay | Admitting: Dietician

## 2023-09-04 ENCOUNTER — Encounter: Payer: PPO | Admitting: Dietician

## 2023-09-04 DIAGNOSIS — N401 Enlarged prostate with lower urinary tract symptoms: Secondary | ICD-10-CM | POA: Diagnosis not present

## 2023-09-04 DIAGNOSIS — N529 Male erectile dysfunction, unspecified: Secondary | ICD-10-CM | POA: Diagnosis not present

## 2023-09-04 DIAGNOSIS — R3911 Hesitancy of micturition: Secondary | ICD-10-CM | POA: Diagnosis not present

## 2023-09-04 LAB — GLUCOSE, CAPILLARY: Glucose-Capillary: 156 mg/dL — ABNORMAL HIGH (ref 70–99)

## 2023-09-04 NOTE — Telephone Encounter (Signed)
Needs help putting on his Freestyle Libre 3.

## 2023-09-09 NOTE — Telephone Encounter (Unsigned)
He called to let us know that his sensor stopped working. This is the second or third sensor that has done this. He was provided with [employer] customer support number to report his issues.

## 2023-09-10 ENCOUNTER — Encounter: Payer: Self-pay | Admitting: Student

## 2023-09-10 ENCOUNTER — Ambulatory Visit: Payer: PPO | Admitting: Student

## 2023-09-10 ENCOUNTER — Other Ambulatory Visit: Payer: Self-pay

## 2023-09-10 VITALS — BP 132/69 | HR 79 | Temp 97.7°F | Ht 72.0 in | Wt 223.9 lb

## 2023-09-10 DIAGNOSIS — Z7984 Long term (current) use of oral hypoglycemic drugs: Secondary | ICD-10-CM

## 2023-09-10 DIAGNOSIS — Z794 Long term (current) use of insulin: Secondary | ICD-10-CM

## 2023-09-10 DIAGNOSIS — Z1211 Encounter for screening for malignant neoplasm of colon: Secondary | ICD-10-CM

## 2023-09-10 DIAGNOSIS — E538 Deficiency of other specified B group vitamins: Secondary | ICD-10-CM | POA: Diagnosis not present

## 2023-09-10 DIAGNOSIS — I10 Essential (primary) hypertension: Secondary | ICD-10-CM | POA: Diagnosis not present

## 2023-09-10 DIAGNOSIS — R413 Other amnesia: Secondary | ICD-10-CM | POA: Diagnosis not present

## 2023-09-10 DIAGNOSIS — Z7985 Long-term (current) use of injectable non-insulin antidiabetic drugs: Secondary | ICD-10-CM

## 2023-09-10 DIAGNOSIS — E1143 Type 2 diabetes mellitus with diabetic autonomic (poly)neuropathy: Secondary | ICD-10-CM | POA: Diagnosis not present

## 2023-09-10 LAB — GLUCOSE, CAPILLARY: Glucose-Capillary: 198 mg/dL — ABNORMAL HIGH (ref 70–99)

## 2023-09-10 LAB — POCT GLYCOSYLATED HEMOGLOBIN (HGB A1C): Hemoglobin A1C: 8.1 % — AB (ref 4.0–5.6)

## 2023-09-10 MED ORDER — VITAMIN B-12 1000 MCG PO TABS
1000.0000 ug | ORAL_TABLET | Freq: Every day | ORAL | 1 refills | Status: AC
  Filled 2023-09-10 – 2023-10-10 (×3): qty 90, 90d supply, fill #0
  Filled 2023-12-18: qty 90, 90d supply, fill #1

## 2023-09-10 MED ORDER — CYANOCOBALAMIN 1000 MCG/ML IJ SOLN
INTRAMUSCULAR | 0 refills | Status: DC
  Filled 2023-09-10: qty 1, 90d supply, fill #0

## 2023-09-10 MED ORDER — CYANOCOBALAMIN 1000 MCG/ML IJ SOLN: 1000.0000 ug | Freq: Once | INTRAMUSCULAR | Status: DC

## 2023-09-10 NOTE — Assessment & Plan Note (Signed)
 Patient referred for colonoscopy today.

## 2023-09-10 NOTE — Progress Notes (Unsigned)
HPI M former smoker followed for OSA, complicated by CAD/ MI, HTN, Allergic Rhinitis, Asthma, DM2/ gatroparesis, Hyperlipidemia, Depression, Concussion,Glaucoma,  NPSG 05/13/17 - AHI 46.2/ hr, desaturation to 80%,body weight 215lbs   ============================================================================================  06/10/23- 67 yoM former smoker followed for OSA, complicated by CAD/ MI, HTN, Allergic Rhinitis, Asthma, DM2/ gatroparesis, Hyperlipidemia, Depression, Concussion,Glaucoma,  - ventolin hfa,  HST ordered 03/10/23- not done  ?finances Body weight today-223 lbs Flu vax senior senior Sleeps best 5AM-12 noon. Now retired, admits he is bored and depressed. Discussed options. He is willing to try again with sleep study. Also discussed sleep hygiene.  09/11/23- 67 yoM former smoker followed for OSA, complicated by CAD/ MI, HTN, Allergic Rhinitis, Asthma, DM2/ gatroparesis, Hyperlipidemia, Depression, Concussion,Glaucoma,  - ventolin hfa,  Discussed the use of AI scribe software for clinical note transcription with the patient, who gave verbal consent to proceed.  History of Present Illness   The patient, with a history of sleep apnea and diabetes, presents for a follow-up visit. He reports feeling well today, despite having to see his primary care provider for diabetes management the previous day. He expresses frustration with his current sleep apnea treatment, specifically the use of a CPAP machine. He is awaiting approval for a home sleep test, which has been resubmitted to his new insurance provider, [employer].  The patient also mentions a recent visit to a urologist, where he discussed the possibility of having a testosterone deficiency. He reports experiencing symptoms consistent with this condition, but has not yet undergone testing.  Additionally, the patient expresses interest in receiving a B12 shot, as he was told he might be deficient. However, he was unable to receive the  shot at his previous appointment due to a lack of supply. He inquires about the potential benefits of B12, particularly in relation to energy levels and sleepiness.  Finally, the patient discusses the possibility of using a mouthpiece for his sleep apnea, as recommended by a local orthodontist. He expresses concern about the cost of this treatment, as well as the potential for discomfort and changes to his bite.     ROS-see HPI   + = positive Constitutional:    weight loss, night sweats, fevers, chills, fatigue, lassitude. HEENT:    headaches, difficulty swallowing, tooth/dental problems, sore throat,       sneezing, itching, +ear ache, nasal congestion, post nasal drip, snoring CV:    chest pain, orthopnea, PND, swelling in lower extremities, anasarca,                                   dizziness, palpitations Resp:   shortness of breath with exertion or at rest.                productive cough,   non-productive cough, coughing up of blood.              change in color of mucus.  wheezing.   Skin:    rash or lesions. GI:  No-   heartburn, indigestion, abdominal pain, nausea, vomiting, diarrhea,                 change in bowel habits, loss of appetite GU: dysuria, change in color of urine, no urgency or frequency.   flank pain. MS:   joint pain, stiffness, decreased range of motion, back pain. Neuro-     nothing unusual Psych:  change in mood or affect.  +depression  or anxiety.   memory loss.  OBJ- Physical Exam General- Alert, Oriented, Affect-appropriate, Distress- none acute Skin- rash-none, lesions- none, excoriation- none Lymphadenopathy- none Head- atraumatic            Eyes- Gross vision intact, PERRLA, conjunctivae and secretions clear            Ears- Hearing, canals-normal            Nose- Clear, no-Septal dev, mucus, polyps, erosion, perforation             Throat- Mallampati IV , mucosa clear , drainage- none, tonsils- atrophic, + partial Neck- flexible , trachea midline, no  stridor , thyroid nl, carotid no bruit Chest - symmetrical excursion , unlabored           Heart/CV- RRR , no murmur , no gallop  , no rub, nl s1 s2                           - JVD- none , edema- none, stasis changes- none, varices- none           Lung- clear to P&A, wheeze- none, cough- none , dullness-none, rub- none           Chest wall-  Abd-  Br/ Gen/ Rectal- Not done, not indicated Extrem- cyanosis- none, clubbing, none, atrophy- none, strength- nl Neuro- grossly intact to observation  Assessment and Plan    Sleep Apnea Patient has been off CPAP therapy. Discussed the need for an updated home sleep test to determine the current severity of sleep apnea and to justify the need for a new CPAP machine to the insurance company. Discussed the potential use of an oral appliance for mild sleep apnea, but the need for a sleep study to determine appropriateness. -Resubmit order for home sleep test under current insurance. -If patient does not hear from the sleep study team within a week, patient to contact the office.  Possible B12 Deficiency Patient reports feeling fatigued and was suggested to have a B12 shot by another provider. Discussed that B12 supplementation would only be beneficial if there is a B12 deficiency. -Primary care provider to manage potential B12 deficiency.  Possible Testosterone Deficiency Patient reports symptoms suggestive of low testosterone. Discussed that testosterone supplementation is only beneficial if there is a testosterone deficiency and that over-supplementation can have negative effects. -Patient to follow up with urologist for testosterone testing.  Diabetes Patient reports recent visit to outpatient clinic for diabetes management. -Continue current management under outpatient clinic.

## 2023-09-10 NOTE — Assessment & Plan Note (Signed)
Past medical history of hypertension.  His current medication regimen includes amlodipine-benazepril 10-40 mg daily and hydrochlorothiazide 25 mg daily.  Patient recently had BMP in September 2024 showing normal renal function.  No acute concerns today.  Blood pressure today is 132/69.  Plan: -Continue amlodipine-benazepril 10-40 mg daily -Continue HCTZ 25 mg daily -Can obtain BMP at next visit

## 2023-09-10 NOTE — Assessment & Plan Note (Signed)
Patient has a past medical history of type 2 diabetes mellitus.  His A1c today is 8.1 which is down from 9.0.  His current regimen includes Trulicity 3 mg weekly, Jardiance 10 mg daily, and is on an insulin pump.  He does not use basal insulin.  He sees our diabetic specialist in clinic.  He states that recently he has been having trouble with his CGM and due to this he has been blindly administering insulin.  I told him to avoid doing this.  He is supposed to carb count, and then check his blood sugar, and then calculate how much insulin he is supposed to give himself.  He has not been doing this.  He knows he is supposed to.  He states he will try to.  He does admit to sometimes having sweets and sodas and that he would likely not stop this.  I am encouraged by the fact that his A1c is improving.  I just think it is a safety issue that he is blindly administering insulin.  Plan: -Recommended finger prick, which she agrees -Continue Trulicity 3 mg weekly -Continue Jardiance 10 mg daily -Continue insulin pump -Continue to follow with our diabetic specialist -Continue to follow with Dr. Dione Booze for eye exam, last exam was November 2024

## 2023-09-10 NOTE — Assessment & Plan Note (Signed)
Patient continues to have concerns about memory loss.  He states he starts forgetting things.  At previous visit he mentioned this in September as well.  At that time he had low normal vitamin B12 and was recommended to start supplementation.  He did not.  I sent in vitamin B12 supplementation today.  Plan: -At follow-up visit can do mini cog exam if he is still concerned -Continue B12 supplementation -At next visit can potentially recheck B12

## 2023-09-10 NOTE — Assessment & Plan Note (Signed)
B12 in September 2024 showing low normal result.  Patient did not start supplementation.  Could be contributing to his memory issues.  Plan: -Start B12 supplementation

## 2023-09-10 NOTE — Patient Instructions (Addendum)
Tyler Wilkinson [facility] you for allowing me to take part in your care today.  Here are your instructions.  1.  I am writing you for vitamin B12 supplementation.  Please take 1 daily.  2.  Your A1c is down to 8.1.  Start using your fingerstick glucose to enter into your calculator along with your carb counting to get accurate insulin administration.  Please see Dr. Mikey Bussing in March.  3.  I have referred you for colonoscopy.  Please await phone call to schedule.   Thank you, Dr. Allena Katz  If you have any other questions please contact the internal medicine clinic at [phone] If it is after hours, please call the [place] hospital at [phone] and then ask the person who picks up for the resident on call.

## 2023-09-10 NOTE — Progress Notes (Signed)
CC: Diabetes   HPI:  Tyler Wilkinson is a 68 y.o. male with a past medical history of hypertension, CAD, AAA, asthma, type 2 diabetes, diabetic gastroparesis who presents for follow-up appointment.  Please see assessment and plan for full HPI.  Medications: Asthma: Albuterol Hypertension: Amlodipine-benazepril 10-40 mg daily, and hydrochlorothiazide 25 mg daily CAD: Aspirin 81 mg daily Depression: Wellbutrin 300 mg daily Diabetes: Trulicity 3 mg weekly, Jardiance 10 mg daily, NovoLog 90 units daily on insulin pump Glaucoma: Latanoprost Allergies: Xyzal 5 mg daily, Nasacort Knee pain: Voltaren gel, meloxicam Hyperlipidemia: Crestor 5 mg daily ED: Tadalafil 5 mg daily BPH: Flomax 0.4 mg daily  Past Medical History:  Diagnosis Date   Asthma    CAD (coronary artery disease)    stent RCA 2006 (other residual disease). /  nuclear 2008  no ischemia, inferolateral scar.   Cataract    Chronic bronchitis (HCC)    "anytime I get sick it goes into bronchitis; probably get it q yr"   Daily headache    "just recently" (05/10/2014)   Dyslipidemia    Educated about COVID-19 virus infection 01/04/2019   Ejection fraction    EF 55%,echo, 2008, inferolateral hypokinesis,   Erectile dysfunction    Flu-like symptoms 09/30/2018   Gastroparesis    Hypertension    Myocardial infarction (HCC) 2006   Severe obstructive sleep apnea-hypopnea syndrome 04/15/2009   Sleep study 03/15/2009 AHI of 82.2/hr oxygen desaturation nadir of 77%, loud snoring. Did not tolerate CPAP due to mask.   Sprain of right ankle 07/10/2012   Tooth pain 05/23/2017   Type 2 diabetes mellitus with complications (HCC) dx'd 1972   Viral upper respiratory tract infection 05/21/2017   Vomiting 11/14/2016     Current Outpatient Medications:    cyanocobalamin (VITAMIN B12) 1000 MCG tablet, Take 1 tablet (1,000 mcg total) by mouth daily., Disp: 90 tablet, Rfl: 1   albuterol (VENTOLIN HFA) 108 (90 Base) MCG/ACT inhaler, Inhale 2  puffs into the lungs every 6 (six) hours as needed for wheezing or shortness of breath., Disp: 1 each, Rfl: 1   amLODipine-benazepril (LOTREL) 10-40 MG capsule, Take 1 capsule by mouth daily., Disp: 90 capsule, Rfl: 3   Arginine 500 MG CAPS, Take 500 mg by mouth daily., Disp: , Rfl:    aspirin EC 81 MG tablet, Take 1 tablet (81 mg total) by mouth daily., Disp: , Rfl:    Blood Glucose Monitoring Suppl (BAYER CONTOUR NEXT USB MONITOR) w/Device KIT, Check blood sugar up to 6 times a day, Disp: 1 kit, Rfl: 1   buPROPion (WELLBUTRIN XL) 300 MG 24 hr tablet, Take 1 tablet (300 mg total) by mouth daily., Disp: 90 tablet, Rfl: 3   Continuous Glucose Receiver (FREESTYLE LIBRE 3 READER) DEVI, use as directed, Disp: 1 each, Rfl: 3   Continuous Glucose Sensor (FREESTYLE LIBRE 3 SENSOR) MISC, Place 1 sensor on the skin every 14 days. Use to check glucose continuously, Disp: 2 each, Rfl: 3   COSOPT PF 22.3-6.8 MG/ML SOLN ophthalmic solution, Place 1 drop into both eyes 2 (two) times daily. , Disp: , Rfl: 3   Diclofenac Sodium (PENNSAID) 2 % SOLN, Apply 2 g topically 2 (two) times daily as needed (to affected area)., Disp: 112 g, Rfl: 3   DM-APAP-CPM (CORICIDIN HBP) 10-325-2 MG TABS, Take 1 tablet by mouth every 6 (six) hours., Disp: 30 tablet, Rfl: 0   Dulaglutide (TRULICITY) 3 MG/0.5ML SOAJ, Inject 3 mg into the skin once a week., Disp:  4 mL, Rfl: 0   empagliflozin (JARDIANCE) 10 MG TABS tablet, Take 1 tablet (10 mg total) by mouth daily before breakfast., Disp: 90 tablet, Rfl: 3   Flaxseed, Linseed, (FLAX SEED OIL PO), Take 1 tablet by mouth daily. , Disp: , Rfl:    fluticasone (FLONASE) 50 MCG/ACT nasal spray, Place 2 sprays into both nostrils daily., Disp: 16 g, Rfl: 0   glucose blood (BAYER CONTOUR NEXT TEST) test strip, Check blood sugar up to 6 times a day, Disp: 200 each, Rfl: 5   hydrochlorothiazide (HYDRODIURIL) 25 MG tablet, Take 1 tablet (25 mg total) by mouth daily., Disp: 90 tablet, Rfl: 3   insulin  aspart (NOVOLOG) 100 UNIT/ML injection, Use to fill omnipod insulin pump up to 90 units daily., Disp: 30 mL, Rfl: 11   Insulin Disposable Pump (OMNIPOD DASH PODS, GEN 4,) MISC, Fill with Novolog insulin and apply new pod about every 2 days, Disp: 15 each, Rfl: 11   Insulin Syringe-Needle U-100 (INSULIN SYRINGE .5CC/31GX5/16") 31G X 5/16" 0.5 ML MISC, Use to inject insulin 3 times a day Dx code 250.00, Disp: 100 each, Rfl: 12   latanoprost (XALATAN) 0.005 % ophthalmic solution, Place 1 drop into both eyes at bedtime., Disp: , Rfl:    latanoprost (XALATAN) 0.005 % ophthalmic solution, Place 1 drop into both eyes daily., Disp: 6 mL, Rfl: 0   levocetirizine (XYZAL) 5 MG tablet, Take 1 tablet (5 mg total) by mouth every evening., Disp: 90 tablet, Rfl: 3   meloxicam (MOBIC) 15 MG tablet, Take 1 tablet (15 mg total) by mouth daily as needed for pain (knee)., Disp: 20 tablet, Rfl: 2   Misc. Devices (PULSE OXIMETER FOR FINGER) MISC, Take pulse ox level in the morning and at night. If less than or equal to 94 please call our clinic., Disp: 1 each, Rfl: 0   ONETOUCH DELICA LANCETS 33G MISC, Use to check blood sugars up to 4 times a day. Dx code:E11.65, Disp: 200 each, Rfl: 11   rosuvastatin (CRESTOR) 5 MG tablet, Take 1 tablet (5 mg total) by mouth daily., Disp: 90 tablet, Rfl: 3   tadalafil (CIALIS) 5 MG tablet, Take 1 tablet (5 mg total) by mouth daily., Disp: 30 tablet, Rfl: 2   tamsulosin (FLOMAX) 0.4 MG CAPS capsule, Take 1 capsule (0.4 mg total) by mouth daily after breakfast., Disp: 90 capsule, Rfl: 1   triamcinolone (NASACORT AQ) 55 MCG/ACT AERO nasal inhaler, Place 1 spray into the nose 2 (two) times daily., Disp: 1 Inhaler, Rfl: 0  Current Facility-Administered Medications:    cyanocobalamin (VITAMIN B12) injection 1,000 mcg, 1,000 mcg, Intramuscular, Once,   Review of Systems:    Negative except what is stated in HPI.  Physical Exam:  Vitals:   09/10/23 1601  BP: 132/69  Pulse: 79  Temp:  97.7 F (36.5 C)  TempSrc: Oral  SpO2: 98%  Weight: 223 lb 14.4 oz (101.6 kg)  Height: 6' (1.829 m)    General: Patient is sitting comfortably in the room patient has a Cardio: Regular rate and rhythm, no murmurs, rubs or gallop Pulmonary: Clear to ausculation bilaterally with no rales, rhonchi, and crackles    Assessment & Plan:   Essential hypertension Past medical history of hypertension.  His current medication regimen includes amlodipine-benazepril 10-40 mg daily and hydrochlorothiazide 25 mg daily.  Patient recently had BMP in September 2024 showing normal renal function.  No acute concerns today.  Blood pressure today is 132/69.  Plan: -Continue amlodipine-benazepril 10-40  mg daily -Continue HCTZ 25 mg daily -Can obtain BMP at next visit  Memory change Patient continues to have concerns about memory loss.  He states he starts forgetting things.  At previous visit he mentioned this in September as well.  At that time he had low normal vitamin B12 and was recommended to start supplementation.  He did not.  I sent in vitamin B12 supplementation today.  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  His A1c today is 8.1 which is down from 9.0.  His current regimen includes Trulicity 3 mg weekly, Jardiance 10 mg daily, and is on an insulin pump.  He does not use basal insulin.  He sees our diabetic specialist in clinic.  He states that recently he has been having trouble with his CGM and due to this he has been blindly administering insulin.  I told him to avoid doing this.  He is supposed to carb count, and then check his blood sugar, and then calculate how much insulin he is supposed to give himself.  He has not been doing this.  He knows he is supposed to.   He states he will try to.  He does admit to sometimes having sweets and sodas and that he would likely not stop this.  I am encouraged by the fact that his A1c is improving.  I just think it is a safety issue that he is blindly administering insulin.  Plan: -Recommended finger prick, which she agrees -Continue Trulicity 3 mg weekly -Continue Jardiance 10 mg daily -Continue insulin pump -Continue to follow with our diabetic specialist -Continue to follow with Dr. Dione Booze for eye exam, last exam was November 2024  B12 deficiency B12 in September 2024 showing low normal result.  Patient did not start supplementation.  Could be contributing to his memory issues.  Plan: -Start B12 supplementation  Colon cancer screening Patient referred for colonoscopy today.  Patient discussed with Dr.  Dickie La  Modena Slater, DO PGY-2 Internal Medicine Resident  Pager: [phone]

## 2023-09-11 ENCOUNTER — Encounter: Payer: Self-pay | Admitting: Internal Medicine

## 2023-09-11 ENCOUNTER — Other Ambulatory Visit: Payer: Self-pay

## 2023-09-11 ENCOUNTER — Ambulatory Visit: Payer: PPO | Admitting: Internal Medicine

## 2023-09-11 VITALS — BP 120/80 | HR 78 | Ht 72.0 in | Wt 222.6 lb

## 2023-09-11 DIAGNOSIS — E119 Type 2 diabetes mellitus without complications: Secondary | ICD-10-CM

## 2023-09-11 DIAGNOSIS — G4733 Obstructive sleep apnea (adult) (pediatric): Secondary | ICD-10-CM | POA: Diagnosis not present

## 2023-09-11 NOTE — Patient Instructions (Signed)
We are re-ordering your Home Sleep Test    You should hear from somebody about scheduling this test soon.

## 2023-09-12 NOTE — Progress Notes (Signed)
Internal Medicine Clinic Attending  Case discussed with the resident at the time of the visit.  We reviewed the resident's history and exam and pertinent patient test results.  I agree with the assessment, diagnosis, and plan of care documented in the resident's note with the following clarification.  Mr. Friedt uses an OmniPod system for insulin delivery, receiving basal insulin through this. Bolus has been inconsistent and has had issues with his sensor. Diabetic education has been involved to help with these concerns, encouraged regular blood glucose monitoring until his sensor issues are resolved. A1c improving.

## 2023-09-12 NOTE — Addendum Note (Signed)
Addended by: Dickie La on: 09/12/2023 10:27 AM   Modules accepted: Level of Service

## 2023-09-15 ENCOUNTER — Telehealth: Payer: Self-pay | Admitting: Dietician

## 2023-09-15 ENCOUNTER — Ambulatory Visit: Payer: PPO

## 2023-09-15 DIAGNOSIS — M76821 Posterior tibial tendinitis, right leg: Secondary | ICD-10-CM

## 2023-09-15 DIAGNOSIS — E1143 Type 2 diabetes mellitus with diabetic autonomic (poly)neuropathy: Secondary | ICD-10-CM

## 2023-09-15 DIAGNOSIS — M2141 Flat foot [pes planus] (acquired), right foot: Secondary | ICD-10-CM

## 2023-09-15 NOTE — Telephone Encounter (Signed)
Freestyle libre sensor failed after 5 days. This is the third one. He is thinking about going back to [facility] and paying for you.

## 2023-09-15 NOTE — Progress Notes (Signed)
Patient presents to the office today for diabetic shoe and insole measuring.  Patient was measured with brannock device to determine size and width for 1 pair of extra depth shoes and foam casted for 3 pair of insoles.   Documentation of medical necessity will be sent to patient's treating diabetic doctor to verify and sign.   Patient's diabetic provider: Carlynn Purl MD   Shoes and insoles will be ordered at that time and patient will be notified for an appointment for fitting when they arrive.   Shoe size (per patient): 10.5 Brannock measurement: 10.5 Patient shoe selection- Shoe choice:   V751 10.5 MD /  484 Shoe size ordered: 10. 2pr ordered comp

## 2023-09-16 ENCOUNTER — Ambulatory Visit: Payer: PPO | Admitting: Podiatry

## 2023-09-19 ENCOUNTER — Other Ambulatory Visit (HOSPITAL_COMMUNITY): Payer: Self-pay

## 2023-09-19 ENCOUNTER — Other Ambulatory Visit: Payer: Self-pay

## 2023-09-22 ENCOUNTER — Other Ambulatory Visit: Payer: Self-pay

## 2023-09-22 MED ORDER — LATANOPROST 0.005 % OP SOLN
1.0000 [drp] | Freq: Every day | OPHTHALMIC | 4 refills | Status: AC
  Filled 2023-09-22: qty 7.5, 75d supply, fill #0
  Filled 2023-12-18: qty 7.5, 75d supply, fill #1
  Filled 2024-03-02: qty 7.5, 75d supply, fill #2
  Filled 2024-05-25: qty 7.5, 75d supply, fill #3
  Filled 2024-05-25: qty 5, 50d supply, fill #3
  Filled 2024-09-17: qty 7.5, 75d supply, fill #4

## 2023-09-23 ENCOUNTER — Other Ambulatory Visit: Payer: Self-pay

## 2023-09-24 ENCOUNTER — Encounter: Payer: Self-pay | Admitting: Podiatry

## 2023-09-24 ENCOUNTER — Ambulatory Visit: Payer: PPO

## 2023-09-24 ENCOUNTER — Ambulatory Visit (INDEPENDENT_AMBULATORY_CARE_PROVIDER_SITE_OTHER): Payer: PPO | Admitting: Podiatry

## 2023-09-24 DIAGNOSIS — G4730 Sleep apnea, unspecified: Secondary | ICD-10-CM | POA: Diagnosis not present

## 2023-09-24 DIAGNOSIS — B351 Tinea unguium: Secondary | ICD-10-CM

## 2023-09-24 DIAGNOSIS — M79674 Pain in right toe(s): Secondary | ICD-10-CM | POA: Diagnosis not present

## 2023-09-24 DIAGNOSIS — E1143 Type 2 diabetes mellitus with diabetic autonomic (poly)neuropathy: Secondary | ICD-10-CM | POA: Diagnosis not present

## 2023-09-24 DIAGNOSIS — G4733 Obstructive sleep apnea (adult) (pediatric): Secondary | ICD-10-CM

## 2023-09-24 DIAGNOSIS — M79675 Pain in left toe(s): Secondary | ICD-10-CM | POA: Diagnosis not present

## 2023-09-24 NOTE — Progress Notes (Signed)
This patient presents to the office with chief complaint of long thick painful nails.  Patient says the nails are painful walking and wearing shoes.  This patient is unable to self treat.  This patient is unable to trim his nails since he is unable to reach his  nails.  She presents to the office for preventative foot care services.  General Appearance  Alert, conversant and in no acute stress.  Vascular  Dorsalis pedis and posterior tibial  pulses are palpable  bilaterally.  Capillary return is within normal limits  bilaterally. Temperature is within normal limits  bilaterally.  Neurologic  Senn-Weinstein monofilament wire test within normal limits  bilaterally. Muscle power within normal limits bilaterally.  Nails Thick disfigured discolored nails with subungual debris  from hallux to fifth toes bilaterally. No evidence of bacterial infection or drainage bilaterally.  Orthopedic  No limitations of motion  feet .  No crepitus or effusions noted.  No bony pathology or digital deformities noted.  Skin  normotropic skin with no porokeratosis noted bilaterally.  No signs of infections or ulcers noted.     Onychomycosis  Nails  B/L.  Pain in right toes  Pain in left toes  Debridement of nails both feet followed trimming the nails with dremel tool.    RTC 3 months.   Gardiner Barefoot DPM

## 2023-10-02 ENCOUNTER — Telehealth: Payer: Self-pay

## 2023-10-02 NOTE — Telephone Encounter (Signed)
Left vm to schedule diabetic shoe pick, inserts are not in yet, but should be by March

## 2023-10-09 ENCOUNTER — Other Ambulatory Visit: Payer: Self-pay

## 2023-10-09 ENCOUNTER — Other Ambulatory Visit: Payer: Self-pay | Admitting: Internal Medicine

## 2023-10-09 ENCOUNTER — Telehealth: Payer: Self-pay | Admitting: *Deleted

## 2023-10-09 DIAGNOSIS — N401 Enlarged prostate with lower urinary tract symptoms: Secondary | ICD-10-CM

## 2023-10-09 MED ORDER — TAMSULOSIN HCL 0.4 MG PO CAPS
0.4000 mg | ORAL_CAPSULE | Freq: Every day | ORAL | 1 refills | Status: DC
  Filled 2023-10-09: qty 90, 90d supply, fill #0

## 2023-10-09 MED ORDER — TRULICITY 3 MG/0.5ML ~~LOC~~ SOAJ
3.0000 mg | SUBCUTANEOUS | 3 refills | Status: AC
  Filled 2023-10-09: qty 6, 84d supply, fill #0
  Filled 2023-11-07 – 2023-12-18 (×2): qty 6, 84d supply, fill #1
  Filled 2024-03-26 – 2024-04-23 (×3): qty 6, 84d supply, fill #2
  Filled 2024-07-30: qty 6, 84d supply, fill #3

## 2023-10-09 NOTE — Telephone Encounter (Signed)
 Patient was identified as falling into the [place] Measure - Diabetes.   Patient was: Appointment scheduled with primary care provider in the next 30 days.

## 2023-10-10 ENCOUNTER — Other Ambulatory Visit: Payer: Self-pay

## 2023-10-10 DIAGNOSIS — G4733 Obstructive sleep apnea (adult) (pediatric): Secondary | ICD-10-CM | POA: Diagnosis not present

## 2023-10-15 ENCOUNTER — Telehealth: Payer: Self-pay

## 2023-10-15 NOTE — Telephone Encounter (Signed)
 Tyler Wilkinson AUTH # [id number] VALID THRU 10/01/23-12/30/23

## 2023-11-03 DIAGNOSIS — R1313 Dysphagia, pharyngeal phase: Secondary | ICD-10-CM | POA: Diagnosis not present

## 2023-11-03 DIAGNOSIS — Z1211 Encounter for screening for malignant neoplasm of colon: Secondary | ICD-10-CM | POA: Diagnosis not present

## 2023-11-04 ENCOUNTER — Telehealth: Payer: Self-pay | Admitting: Internal Medicine

## 2023-11-04 DIAGNOSIS — G4733 Obstructive sleep apnea (adult) (pediatric): Secondary | ICD-10-CM

## 2023-11-04 NOTE — Telephone Encounter (Signed)
 Patient had SNAP HST on 09/24/2023.  Will send Dr. Maple Hudson a message to review sleep study.  Will call patient once we get results.   Called patient and informed him we are sending HST results to Dr. Maple Hudson for review.  Patient verbalized understanding and he will await result information.

## 2023-11-04 NOTE — Telephone Encounter (Signed)
 Patient completed his home sleep study about two or three months ago and is wondering when he will receive his cpap machine. He can be reached at [phone]

## 2023-11-05 NOTE — Telephone Encounter (Signed)
 Home sleep test showed severe obstructive sleep apnea off CPAP, averaging 29 apneas/ hour, with low blood oxygen levels. The best treatment for him will be to restart CPAP. I  recommend we order DME, CPAP auto 5-20, mask of choice, humidifier, supplies, AirView/ card. If he wants to wait, we can discuss this at his scheduled appointment in April, but I think this should be treated.

## 2023-11-06 NOTE — Telephone Encounter (Signed)
 Attempted to call patient.  VM box full and cannot receive messages.

## 2023-11-07 ENCOUNTER — Other Ambulatory Visit: Payer: Self-pay | Admitting: Internal Medicine

## 2023-11-07 ENCOUNTER — Telehealth: Payer: Self-pay | Admitting: Internal Medicine

## 2023-11-07 ENCOUNTER — Other Ambulatory Visit: Payer: Self-pay

## 2023-11-07 NOTE — Telephone Encounter (Signed)
 Called patient.  Ordered CPAP.  Patient informed. NFN.

## 2023-11-07 NOTE — Telephone Encounter (Signed)
 Patient returned call.  Gave all information.  NFN.

## 2023-11-07 NOTE — Telephone Encounter (Signed)
 Attempted to call patient x 2. VM box full and cannot receive messages.   Will move forward and place order for CPAP per patients initial telephone call on 11/04/2023.

## 2023-11-07 NOTE — Telephone Encounter (Signed)
 PT ret Amy's call. I told him we are ordering a CPAP for him per her message.

## 2023-11-10 ENCOUNTER — Other Ambulatory Visit: Payer: Self-pay

## 2023-11-10 MED ORDER — TADALAFIL 5 MG PO TABS
5.0000 mg | ORAL_TABLET | Freq: Every day | ORAL | 0 refills | Status: DC
Start: 2023-11-10 — End: 2023-11-13
  Filled 2023-11-10: qty 30, 30d supply, fill #0

## 2023-11-11 ENCOUNTER — Other Ambulatory Visit: Payer: Self-pay

## 2023-11-11 ENCOUNTER — Telehealth: Payer: Self-pay

## 2023-11-11 ENCOUNTER — Telehealth: Payer: Self-pay | Admitting: *Deleted

## 2023-11-11 NOTE — Telephone Encounter (Unsigned)
 Copied from CRM [id number]. Topic: General - Other >> Nov 11, 2023  3:53 PM Irine Seal wrote: Reason for CRM: Patient states that [facility] in [place] sent a fax re claims to have contacted [place] 2-3 times, with the most recent fax sent on Friday. Last note indicates [employer] has not received paperwork from the doctor. Patient is requesting that the paperwork be signed by PCP and faxed back. Patient could not confirm if they had the correct fax number.  Advised patient that I will send a message to the provider's office to check on the status of the paperwork. Patient stated that if the paperwork cannot be tracked down, [facility] gave him a physical copy to bring to his appointment on 11/13/23 for the provider to complete in-office. Advised him to bring it to be safe in case we are unable to locate the faxed paperwork. Patient verbalized understanding

## 2023-11-11 NOTE — Telephone Encounter (Signed)
 Completed.

## 2023-11-11 NOTE — Telephone Encounter (Signed)
 SPOKE WITH PT GIVING HIM UPDATE ON SHOES, [facility] HAS NOT REC'D PPWK FROM DR HE SAID HE IS GOING ON THE 3/27 AND WILL BRIGN SIGNED PPWK BACK TO ME TO FAX TO SS.

## 2023-11-12 NOTE — Telephone Encounter (Signed)
 Forms has been completed and signed by Dr. Mikey Bussing. [facility] fax line is down, will give the original copy to patient when he come to his appt tomorrow.

## 2023-11-13 ENCOUNTER — Telehealth: Payer: Self-pay | Admitting: Dietician

## 2023-11-13 ENCOUNTER — Encounter: Payer: Self-pay | Admitting: Internal Medicine

## 2023-11-13 ENCOUNTER — Other Ambulatory Visit: Payer: Self-pay

## 2023-11-13 ENCOUNTER — Ambulatory Visit: Payer: PPO | Admitting: Internal Medicine

## 2023-11-13 VITALS — BP 142/67 | HR 81 | Temp 97.7°F | Ht 72.0 in | Wt 225.1 lb

## 2023-11-13 DIAGNOSIS — F339 Major depressive disorder, recurrent, unspecified: Secondary | ICD-10-CM

## 2023-11-13 DIAGNOSIS — N401 Enlarged prostate with lower urinary tract symptoms: Secondary | ICD-10-CM | POA: Diagnosis not present

## 2023-11-13 DIAGNOSIS — R350 Frequency of micturition: Secondary | ICD-10-CM

## 2023-11-13 DIAGNOSIS — E1143 Type 2 diabetes mellitus with diabetic autonomic (poly)neuropathy: Secondary | ICD-10-CM

## 2023-11-13 DIAGNOSIS — R3914 Feeling of incomplete bladder emptying: Secondary | ICD-10-CM

## 2023-11-13 DIAGNOSIS — Z7984 Long term (current) use of oral hypoglycemic drugs: Secondary | ICD-10-CM | POA: Diagnosis not present

## 2023-11-13 LAB — POCT URINALYSIS DIPSTICK
Bilirubin, UA: NEGATIVE
Blood, UA: NEGATIVE
Glucose, UA: POSITIVE — AB
Ketones, UA: NEGATIVE
Leukocytes, UA: NEGATIVE
Nitrite, UA: NEGATIVE
Protein, UA: NEGATIVE
Spec Grav, UA: 1.01 (ref 1.010–1.025)
Urobilinogen, UA: 0.2 U/dL
pH, UA: 5.5 (ref 5.0–8.0)

## 2023-11-13 LAB — POCT GLYCOSYLATED HEMOGLOBIN (HGB A1C): Hemoglobin A1C: 8.4 % — AB (ref 4.0–5.6)

## 2023-11-13 LAB — GLUCOSE, CAPILLARY: Glucose-Capillary: 166 mg/dL — ABNORMAL HIGH (ref 70–99)

## 2023-11-13 MED ORDER — BUPROPION HCL ER (XL) 300 MG PO TB24
300.0000 mg | ORAL_TABLET | Freq: Every day | ORAL | 3 refills | Status: AC
  Filled 2023-11-13 – 2023-12-18 (×2): qty 90, 90d supply, fill #0
  Filled 2024-04-23: qty 90, 90d supply, fill #1
  Filled 2024-08-17 – 2024-09-17 (×3): qty 90, 90d supply, fill #2

## 2023-11-13 MED ORDER — HYDROCHLOROTHIAZIDE 25 MG PO TABS
25.0000 mg | ORAL_TABLET | Freq: Every day | ORAL | 3 refills | Status: AC
  Filled 2023-11-13: qty 90, 90d supply, fill #0

## 2023-11-13 MED ORDER — TAMSULOSIN HCL 0.4 MG PO CAPS
0.4000 mg | ORAL_CAPSULE | Freq: Every day | ORAL | 3 refills | Status: DC
  Filled 2023-11-13 – 2023-12-18 (×2): qty 90, 90d supply, fill #0

## 2023-11-13 MED ORDER — AMLODIPINE BESY-BENAZEPRIL HCL 10-40 MG PO CAPS
1.0000 | ORAL_CAPSULE | Freq: Every day | ORAL | 3 refills | Status: AC
  Filled 2023-11-13 – 2024-02-04 (×2): qty 90, 90d supply, fill #0
  Filled 2024-05-25: qty 90, 90d supply, fill #1

## 2023-11-13 MED ORDER — ROSUVASTATIN CALCIUM 5 MG PO TABS
5.0000 mg | ORAL_TABLET | Freq: Every day | ORAL | 3 refills | Status: AC
  Filled 2023-11-13 – 2024-03-02 (×2): qty 90, 90d supply, fill #0
  Filled 2024-05-25: qty 90, 90d supply, fill #1
  Filled 2024-09-17: qty 90, 90d supply, fill #2

## 2023-11-13 MED ORDER — TADALAFIL 5 MG PO TABS
5.0000 mg | ORAL_TABLET | Freq: Every day | ORAL | 3 refills | Status: AC
  Filled 2023-11-13: qty 90, 90d supply, fill #0
  Filled 2023-12-18 – 2024-03-02 (×2): qty 30, 30d supply, fill #0
  Filled 2024-03-29 – 2024-04-23 (×2): qty 30, 30d supply, fill #1
  Filled 2024-05-25: qty 30, 30d supply, fill #2
  Filled 2024-07-01 (×2): qty 30, 30d supply, fill #3
  Filled 2024-07-30 (×2): qty 30, 30d supply, fill #4
  Filled 2024-09-17: qty 30, 30d supply, fill #5

## 2023-11-13 MED ORDER — EMPAGLIFLOZIN 10 MG PO TABS
10.0000 mg | ORAL_TABLET | Freq: Every day | ORAL | 3 refills | Status: AC
  Filled 2023-11-13 – 2023-12-18 (×2): qty 90, 90d supply, fill #0
  Filled 2024-04-23: qty 90, 90d supply, fill #1
  Filled 2024-08-17 – 2024-09-17 (×2): qty 90, 90d supply, fill #2

## 2023-11-13 NOTE — Progress Notes (Unsigned)
   Established Patient Office Visit  Subjective   Patient ID: Tyler Wilkinson, male    DOB: 23-Mar-1956  Age: 68 y.o. MRN: [id number]  Chief Complaint  Patient presents with   Medical Management of Chronic Issues    Urinary frequency.   Tyler Wilkinson is here today to follow-up chronic issues mainly diabetes and hypertension.  And also to discuss some increased urinary frequency.  For his diabetes he continues to use the [employer] he has had trouble with the freestyle libre CGM and has not been able to consistently use this.  This is caused him to have some worsening control of his diabetes and be more unsure of how to adjust his insulin.  He feels that the [facility] was much better CGM.  He will be meeting with Tyler Wilkinson shortly after our visit today to discuss this further.     Objective:     BP (!) 148/88 (BP Location: Left Arm, Patient Position: Sitting, Cuff Size: Normal)   Pulse 96   Temp 97.7 F (36.5 C) (Oral)   Ht 6' (1.829 m)   Wt 225 lb 1.6 oz (102.1 kg)   SpO2 99% Comment: RA  BMI 30.53 kg/m  {Vitals History (Optional):23777}  Physical Exam   Results for orders placed or performed in visit on 11/13/23  Glucose, capillary  Result Value Ref Range   Glucose-Capillary 166 (H) 70 - 99 mg/dL  POC Hbg U2V  Result Value Ref Range   Hemoglobin A1C 8.4 (A) 4.0 - 5.6 %   HbA1c POC (<> result, manual entry)     HbA1c, POC (prediabetic range)     HbA1c, POC (controlled diabetic range)      {Labs (Optional):23779}  The ASCVD Risk score (Arnett DK, et al., 2019) failed to calculate for the following reasons:   Risk score cannot be calculated because patient has a medical history suggesting prior/existing ASCVD    Assessment & Plan:   Problem List Items Addressed This Visit       Endocrine   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 (HCC) - Primary (Chronic)   Relevant Medications   empagliflozin (JARDIANCE) 10 MG TABS tablet   amLODipine-benazepril  (LOTREL) 10-40 MG capsule   buPROPion (WELLBUTRIN XL) 300 MG 24 hr tablet   rosuvastatin (CRESTOR) 5 MG tablet   Other Relevant Orders   POC Hbg A1C (Completed)     Other   Major depression, recurrent, chronic (HCC)   Relevant Medications   buPROPion (WELLBUTRIN XL) 300 MG 24 hr tablet   Other Visit Diagnoses       Urinary frequency       Relevant Orders   Urinalysis, Reflex Microscopic   POCT urinalysis dipstick     Benign prostatic hyperplasia with incomplete bladder emptying       Relevant Medications   tamsulosin (FLOMAX) 0.4 MG CAPS capsule       Return in about 4 months (around 03/14/2024) for DM, HTN.    Tyler Rung, DO

## 2023-11-13 NOTE — Telephone Encounter (Signed)
 Spoke with Tyler Wilkinson while he was in the office today. He was assisted in putting on a  Freestyle libre 3 plus sensor and starting it while here.  He is interested in having prescriptions sent in for Omnipod 5 insulin pump upgrade for a price check. To do that we need to know if he wants to use the Freestyle libre 2 plus (intermittent- scanning required)  or the Dexcom G7 (truly continuous- no scanning required) Continuous glucose monitor.

## 2023-11-18 ENCOUNTER — Telehealth: Payer: Self-pay | Admitting: *Deleted

## 2023-11-18 NOTE — Telephone Encounter (Signed)
 Will forward to PCP

## 2023-11-18 NOTE — Telephone Encounter (Signed)
 Call to patient informed him that the only lab results that were done have been discussed with him already.  Stated he got the message in MyChart about lab results and thought there might have been something else.

## 2023-11-18 NOTE — Telephone Encounter (Signed)
 Tyler Wilkinson will you let him know that the only results are the ones we went over in his visit- his A1c and the urinalysis that showed glucose in the urine.

## 2023-11-18 NOTE — Telephone Encounter (Signed)
 Asked to know test results, his A1c and urinalysis results shared.  Asked him about about CGM brand with pump upgrade. He said to put the upgrade on hold. Described a low blood sugar that occurred yesterday when he had a hard time thinking. Advised pump upgrade could help prevent lows.

## 2023-11-18 NOTE — Telephone Encounter (Signed)
 Copied from CRM [id number]. Topic: Clinical - Lab/Test Results >> Nov 18, 2023 10:40 AM Prudencio Pair wrote: Reason for CRM: Patient states he got notifications via MyChart about his tests results that he had done. States he can't access them via MyChart because he forgot his login info. Checked labs but there are no notes in regards to the results. Patient would like for the nurse/provider to contact him to discuss results. CB #: [id number]

## 2023-11-21 NOTE — Assessment & Plan Note (Signed)
 His urinary frequency is likely combination of his uncontrolled glucose with glucosuria and BPH.  He has had some inconsistent use of tamsulosin I instructed him to use this daily he has also been using Cialis and consistently I also instructed him to use this daily and let me know how his urinary frequency changes.

## 2023-11-21 NOTE — Assessment & Plan Note (Addendum)
 Need to get him back on a CGM that we will work for him hopefully we can work out this [employer].  Discussed with Lupita Leash maybe we can have him upgrade from the OmniPod Dash to OmniPod 5 which would work well with the Dexcom G6 to use the automated insulin delivery function.  Recently he has not been using Jardiance but has had increased urinary frequency urine dipstick here shows glucosuria given the absence of SGLT2 inhibitor and his decreased glucose control I discussed with him that his urinary frequency is a product of his uncontrolled diabetes.  This is likely worsened by BPH he has had some inconsistent use of tamsulosin I instructed him to use this daily with the addition of Cialis-tadalafil 5 mg daily

## 2023-11-21 NOTE — Assessment & Plan Note (Signed)
 Mood seems improved we will continue the bupropion 300 mg daily

## 2023-11-25 ENCOUNTER — Other Ambulatory Visit: Payer: Self-pay

## 2023-11-28 ENCOUNTER — Other Ambulatory Visit: Payer: Self-pay

## 2023-11-28 ENCOUNTER — Other Ambulatory Visit (HOSPITAL_COMMUNITY): Payer: Self-pay

## 2023-12-04 ENCOUNTER — Ambulatory Visit: Payer: Self-pay

## 2023-12-04 ENCOUNTER — Other Ambulatory Visit: Payer: Self-pay

## 2023-12-04 ENCOUNTER — Encounter: Payer: Self-pay | Admitting: *Deleted

## 2023-12-04 ENCOUNTER — Telehealth: Payer: Self-pay | Admitting: *Deleted

## 2023-12-04 NOTE — Telephone Encounter (Signed)
 Copied from CRM [id number]. Topic: Clinical - Medical Advice >> Dec 04, 2023  1:39 PM Tisa Forester wrote: Reason for CRM: Reason for CRM: patient request for Liisa Reeves  to call him have question please call  [phone]

## 2023-12-04 NOTE — Telephone Encounter (Signed)
 Called pt - no answer; left message (per Dr Jarvis Mesa) on pt's self-identified vm. I will also send pt a My Chart message of Dr Zacarias Hermann response.Tyler Wilkinson

## 2023-12-04 NOTE — Telephone Encounter (Signed)
See Triage Note.

## 2023-12-04 NOTE — Telephone Encounter (Signed)
 Chief Complaint: diarrhea Symptoms: abd pain Frequency: 2 weeks Pertinent Negatives: Patient denies fever, blood in stool, dehydration Disposition: [] ED /[] Urgent Care (no appt availability in office) / [x] Appointment(In office/virtual)/ []  [place] [facility]/ [] Home Care/ [] Refused Recommended Disposition /[] [place] [facility]/ []  Follow-up with PCP Additional Notes: Pt c/o diarrhea x 2 weeks after traveling to [place]. Pt denies fevers, N/V, blood in stool. BM initially improved but now having 3-4 loose, watery BM. Scheduled patient per protocol on 12/08/2023. Patient verbalized understanding and to call back/go to UC with worsening symptoms.      Copied from CRM [id number]. Topic: Clinical - Red Word Triage >> Dec 04, 2023  2:19 PM Alessandra Bevels wrote: Red Word that prompted transfer to Nurse Triage: Patient is calling to report diarrhea since 11/24/2023 with some abdominal pain since yesterday. Please advise Reason for Disposition  [1] Mild diarrhea (e.g., 1-3 or more stools than normal in past 24 hours) without known cause AND [2] present >  7 days  Answer Assessment - Initial Assessment Questions 1. DIARRHEA SEVERITY: "How bad is the diarrhea?" "How many more stools have you had in the past 24 hours than normal?"    - NO DIARRHEA (SCALE 0)   - MILD (SCALE 1-3): Few loose or mushy BMs; increase of 1-3 stools over normal daily number of stools; mild increase in ostomy output.   -  MODERATE (SCALE 4-7): Increase of 4-6 stools daily over normal; moderate increase in ostomy output.   -  SEVERE (SCALE 8-10; OR "WORST POSSIBLE"): Increase of 7 or more stools daily over normal; moderate increase in ostomy output; incontinence.     3-4/ day 2. ONSET: "When did the diarrhea begin?"      11/23/23 - every time I eat I have to use the bathroom, improved some now 3. BM CONSISTENCY: "How loose or watery is the diarrhea?"      Loose watery brown 4. VOMITING: "Are you also vomiting?" If Yes, ask:  "How many times in the past 24 hours?"      denies 5. ABDOMEN PAIN: "Are you having any abdomen pain?" If Yes, ask: "What does it feel like?" (e.g., crampy, dull, intermittent, constant)      Endorses diabetic gastroparesis, colonoscopy 5/1 6. ABDOMEN PAIN SEVERITY: If present, ask: "How bad is the pain?"  (e.g., Scale 1-10; mild, moderate, or severe)   - MILD (1-3): doesn't interfere with normal activities, abdomen soft and not tender to touch    - MODERATE (4-7): interferes with normal activities or awakens from sleep, abdomen tender to touch    - SEVERE (8-10): excruciating pain, doubled over, unable to do any normal activities       Intermittent pain - Moderate yesterday 7. ORAL INTAKE: If vomiting, "Have you been able to drink liquids?" "How much liquids have you had in the past 24 hours?"     Can keep fluids down 8. HYDRATION: "Any signs of dehydration?" (e.g., dry mouth [not just dry lips], too weak to stand, dizziness, new weight loss) "When did you last urinate?"     Able to walk, not too weak Denies dizziness 9. EXPOSURE: "Have you traveled to a foreign country recently?" "Have you been exposed to anyone with diarrhea?" "Could you have eaten any food that was spoiled?"     Just traveled to different state 10. ANTIBIOTIC USE: "Are you taking antibiotics now or have you taken antibiotics in the past 2 months?"       no 11. OTHER  SYMPTOMS: "Do you have any other symptoms?" (e.g., fever, blood in stool)       denies  Protocols used: Diarrhea-A-AH

## 2023-12-04 NOTE — Telephone Encounter (Signed)
  Copied From CRM [id number]. Reason for Triage: issue with stomach everytime eat getting diarrhea for 10 days , having stomach cramps as well, started when come back from [place]  , Patient call back number [phone]   Left message to call back about symptoms.

## 2023-12-04 NOTE — Telephone Encounter (Signed)
 Left voicemail for return call

## 2023-12-08 ENCOUNTER — Ambulatory Visit (INDEPENDENT_AMBULATORY_CARE_PROVIDER_SITE_OTHER): Payer: Self-pay | Admitting: Student

## 2023-12-08 DIAGNOSIS — R197 Diarrhea, unspecified: Secondary | ICD-10-CM | POA: Diagnosis not present

## 2023-12-08 NOTE — Progress Notes (Addendum)
  [facility] Internal Medicine Residency Telephone Encounter Continuity Care Appointment  HPI:  This telephone encounter was created for Tyler Wilkinson on 12/08/2023 for the following purpose/cc diarrhea.   Past Medical History:  Past Medical History:  Diagnosis Date   Asthma    CAD (coronary artery disease)    stent RCA 2006 (other residual disease). /  nuclear 2008  no ischemia, inferolateral scar.   Cataract    Chronic bronchitis (HCC)    "anytime I get sick it goes into bronchitis; probably get it q yr"   Daily headache    "just recently" (05/10/2014)   Dyslipidemia    Educated about COVID-19 virus infection 01/04/2019   Ejection fraction    EF 55%,echo, 2008, inferolateral hypokinesis,   Erectile dysfunction    Flu-like symptoms 09/30/2018   Gastroparesis    Hypertension    Myocardial infarction (HCC) 2006   Severe obstructive sleep apnea-hypopnea syndrome 04/15/2009   Sleep study 03/15/2009 AHI of 82.2/hr oxygen desaturation nadir of 77%, loud snoring. Did not tolerate CPAP due to mask.   Sprain of right ankle 07/10/2012   Tooth pain 05/23/2017   Type 2 diabetes mellitus with complications (HCC) dx'd 1972   Viral upper respiratory tract infection 05/21/2017   Vomiting 11/14/2016     ROS:  Denies: fever, chills, abdominal pain, nausea, vomiting, blood in stool    Assessment / Plan / Recommendations:   Patient reports diarrhea that started on April 5th after he went to a "hotdog bar" in [place]  [place]. He stated that every time he eats, he has a loose bowel movement. He reports up to 2-3 loose BM a day. He denies bloody stool, fever, chills, nausea, vomiting, or recent antibiotics. Overall, he has not had abdominal pain, but did endorse umbilical abdominal pain for a few seconds on Friday and Saturday: this has not reoccurred.  He is eating/drinking well and does report an unhealthy diet. His diarrhea started to improved on Saturday.   There are no red flag symptoms present  concerning for colitis, infection, or etiologies that need emergent evaluation.   Plan: -Imodium OTC -Patient will call for an appointment if his diarrhea does not improve     Consent and Medical Decision Making:  Patient discussed with Dr. Broadus Canes This is a telephone encounter between [name] and Aurora Lees on 12/08/2023 for diarrhea. The visit was conducted with the patient located at  the state of [place]   and Aurora Lees at [facility]. The patient's identity was confirmed using their DOB and current address. The patient has consented to being evaluated through a telephone encounter and understands the associated risks (an examination cannot be done and the patient may need to come in for an appointment) / benefits (allows the patient to remain at home, decreasing exposure to coronavirus). I personally spent 10 minutes on medical discussion.

## 2023-12-09 DIAGNOSIS — R197 Diarrhea, unspecified: Secondary | ICD-10-CM | POA: Insufficient documentation

## 2023-12-09 NOTE — Assessment & Plan Note (Signed)
 Patient reports diarrhea that started on April 5th after he went to a "hotdog bar" in [place]  [place]. He stated that every time he eats, he has a loose bowel movement. He reports up to 2-3 loose BM a day. He denies bloody stool, fever, chills, nausea, vomiting, or recent antibiotics. Overall, he has not had abdominal pain, but did endorse umbilical abdominal pain for a few seconds on Friday and Saturday: this has not reoccurred.  He is eating/drinking well and does report an unhealthy diet. His diarrhea started to improved on Saturday.   There are no red flag symptoms present concerning for colitis, infection, or etiologies that need emergent evaluation.   Plan: -Imodium OTC -Patient will call for an appointment if his diarrhea does not improve

## 2023-12-10 ENCOUNTER — Telehealth: Payer: Self-pay | Admitting: *Deleted

## 2023-12-10 ENCOUNTER — Other Ambulatory Visit: Payer: Self-pay

## 2023-12-10 NOTE — Telephone Encounter (Signed)
 Pt scheduled for ov with Dr Linder Revere for tomorrow  We ordered his CPAP om 11/07/23  He most likely has not been using it for at least 31 days and therefore needs to push his appt out some  I called to discuss and there was no answer- I left him detailed msg and asked him to call us  back  Routing to Amy to be made aware

## 2023-12-11 ENCOUNTER — Ambulatory Visit: Payer: PPO | Admitting: Internal Medicine

## 2023-12-11 ENCOUNTER — Telehealth: Payer: Self-pay

## 2023-12-11 ENCOUNTER — Other Ambulatory Visit: Payer: Self-pay

## 2023-12-11 MED ORDER — PEG 3350-KCL-NA BICARB-NACL 420 G PO SOLR
ORAL | 0 refills | Status: AC
  Filled 2023-12-11: qty 4000, 1d supply, fill #0

## 2023-12-11 NOTE — Telephone Encounter (Signed)
 He has been approved and they have tried calling multiple times and his VM is full sent email as well. When he calls he should be able to get it. NFN

## 2023-12-11 NOTE — Telephone Encounter (Signed)
 Shoes in. Appt set for 4/25 @ 12:00p auth valid thru 5/13 docs exp 6/25

## 2023-12-11 NOTE — Telephone Encounter (Signed)
 Patient returned call.  Patient states he has never received his CPAP.  Looks like CPAP was ordered on  11/07/2023 to [facility].  Patient states he does not answer his phone if he does not recognize the number and he stated he has not looked at any voice mails.  I gave patient [facility]'s phone number.  Alvaro Jim, what do we need to do?  I canceled patients OV for today with Dr. Linder Revere as this was a compliance check for his CPAP.  Thank you.

## 2023-12-11 NOTE — Telephone Encounter (Signed)
 I will call [facility]

## 2023-12-11 NOTE — Telephone Encounter (Signed)
 Left message x 2 for patient to call back.  Need to know if patient is using his CPAP, and when the 30 days will be up so we can reschedule an OV for a compliance visit.

## 2023-12-11 NOTE — Progress Notes (Deleted)
 HPI M former smoker followed for OSA, complicated by CAD/ MI, HTN, Allergic Rhinitis, Asthma, DM2/ gatroparesis, Hyperlipidemia, Depression, Concussion,Glaucoma,  NPSG 05/13/17 - AHI 46.2/ hr, desaturation to 80%,body weight 215lbs   ============================================================================================  06/10/23- 67 yoM former smoker followed for OSA, complicated by CAD/ MI, HTN, Allergic Rhinitis, Asthma, DM2/ gatroparesis, Hyperlipidemia, Depression, Concussion,Glaucoma,  - ventolin  hfa,  HST ordered 03/10/23- not done  ?finances Body weight today-223 lbs Flu vax senior senior Sleeps best 5AM-12 noon. Now retired, admits he is bored and depressed. Discussed options. He is willing to try again with sleep study. Also discussed sleep hygiene.  09/11/23- 67 yoM former smoker followed for OSA, complicated by CAD/ MI, HTN, Allergic Rhinitis, Asthma, DM2/ gatroparesis, Hyperlipidemia, Depression, Concussion,Glaucoma,  - ventolin  hfa,  Discussed the use of AI scribe software for clinical note transcription with the patient, who gave verbal consent to proceed.  History of Present Illness   The patient, with a history of sleep apnea and diabetes, presents for a follow-up visit. He reports feeling well today, despite having to see his primary care provider for diabetes management the previous day. He expresses frustration with his current sleep apnea treatment, specifically the use of a CPAP machine. He is awaiting approval for a home sleep test, which has been resubmitted to his new insurance provider, [employer].  The patient also mentions a recent visit to a urologist, where he discussed the possibility of having a testosterone  deficiency. He reports experiencing symptoms consistent with this condition, but has not yet undergone testing.  Additionally, the patient expresses interest in receiving a B12 shot, as he was told he might be deficient. However, he was unable to receive the  shot at his previous appointment due to a lack of supply. He inquires about the potential benefits of B12, particularly in relation to energy levels and sleepiness.  Finally, the patient discusses the possibility of using a mouthpiece for his sleep apnea, as recommended by a local orthodontist. He expresses concern about the cost of this treatment, as well as the potential for discomfort and changes to his bite.    Assessment and Plan:    Sleep Apnea Patient has been off CPAP therapy. Discussed the need for an updated home sleep test to determine the current severity of sleep apnea and to justify the need for a new CPAP machine to the insurance company. Discussed the potential use of an oral appliance for mild sleep apnea, but the need for a sleep study to determine appropriateness. -Resubmit order for home sleep test under current insurance. -If patient does not hear from the sleep study team within a week, patient to contact the office.  Possible B12 Deficiency Patient reports feeling fatigued and was suggested to have a B12 shot by another provider. Discussed that B12 supplementation would only be beneficial if there is a B12 deficiency. -Primary care provider to manage potential B12 deficiency.  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 Discussed that testosterone  supplementation is only beneficial if there is a testosterone  deficiency and that over-supplementation can have negative effects. -Patient to follow up with urologist for testosterone  testing.  Diabetes Patient reports recent visit to outpatient clinic for diabetes management. -Continue current management under outpatient clinic.   12/11/23- 68 yoM former smoker followed for OSA, complicated by CAD/ MI, HTN, Allergic Rhinitis, Asthma, DM2/ gatroparesis, Hyperlipidemia, Depression, Concussion, Glaucoma,  - ventolin  hfa,  HST 09/24/23- AHI 29.2/hr, desat to 80%, body weight 215  lbs CPAP auto 5-20/ [facility]  ordered 11/17/23   He hasn't gotten CPAP. DME got the order but patient may not be answering calls/ voice mail. Body weight today-     ROS-see HPI   + = positive Constitutional:    weight loss, night sweats, fevers, chills, fatigue, lassitude. HEENT:    headaches, difficulty swallowing, tooth/dental problems, sore throat,       sneezing, itching, +ear ache, nasal congestion, post nasal drip, snoring CV:    chest pain, orthopnea, PND, swelling in lower extremities, anasarca,                                   dizziness, palpitations Resp:   shortness of breath with exertion or at rest.                productive cough,   non-productive cough, coughing up of blood.              change in color of mucus.  wheezing.   Skin:    rash or lesions. GI:  No-   heartburn, indigestion, abdominal pain, nausea, vomiting, diarrhea,                 change in bowel habits, loss of appetite GU: dysuria, change in color of urine, no urgency or frequency.   flank pain. MS:   joint pain, stiffness, decreased range of motion, back pain. Neuro-     nothing unusual Psych:  change in mood or affect.  +depression or anxiety.   memory loss.  OBJ- Physical Exam General- Alert, Oriented, Affect-appropriate, Distress- none acute Skin- rash-none, lesions- none, excoriation- none Lymphadenopathy- none Head- atraumatic            Eyes- Gross vision intact, PERRLA, conjunctivae and secretions clear            Ears- Hearing, canals-normal            Nose- Clear, no-Septal dev, mucus, polyps, erosion, perforation             Throat- Mallampati IV , mucosa clear , drainage- none, tonsils- atrophic, + partial Neck- flexible , trachea midline, no stridor , thyroid nl, carotid no bruit Chest - symmetrical excursion , unlabored           Heart/CV- RRR , no murmur , no gallop  , no rub, nl s1 s2                           - JVD- none , edema- none, stasis changes- none, varices- none            Lung- clear to P&A, wheeze- none, cough- none , dullness-none, rub- none           Chest wall-  Abd-  Br/ Gen/ Rectal- Not done, not indicated Extrem- cyanosis- none, clubbing, none, atrophy- none, strength- nl Neuro- grossly intact to observation

## 2023-12-11 NOTE — Telephone Encounter (Signed)
 Closing this encounter two are open for same PT

## 2023-12-12 ENCOUNTER — Encounter (INDEPENDENT_AMBULATORY_CARE_PROVIDER_SITE_OTHER)

## 2023-12-12 ENCOUNTER — Encounter

## 2023-12-12 ENCOUNTER — Other Ambulatory Visit: Payer: Self-pay

## 2023-12-12 DIAGNOSIS — E1143 Type 2 diabetes mellitus with diabetic autonomic (poly)neuropathy: Secondary | ICD-10-CM

## 2023-12-12 DIAGNOSIS — M2142 Flat foot [pes planus] (acquired), left foot: Secondary | ICD-10-CM | POA: Diagnosis not present

## 2023-12-12 DIAGNOSIS — M2141 Flat foot [pes planus] (acquired), right foot: Secondary | ICD-10-CM

## 2023-12-12 NOTE — Progress Notes (Deleted)

## 2023-12-12 NOTE — Progress Notes (Signed)

## 2023-12-17 NOTE — Progress Notes (Signed)
 Internal Medicine Clinic Attending  Case discussed with the resident at the time of the visit.  We reviewed the resident's history and exam and pertinent patient test results.  I agree with the assessment, diagnosis, and plan of care documented in the resident's note.

## 2023-12-18 ENCOUNTER — Other Ambulatory Visit: Payer: Self-pay

## 2023-12-18 ENCOUNTER — Other Ambulatory Visit (HOSPITAL_COMMUNITY): Payer: Self-pay

## 2023-12-19 ENCOUNTER — Other Ambulatory Visit: Payer: Self-pay

## 2023-12-24 ENCOUNTER — Ambulatory Visit (INDEPENDENT_AMBULATORY_CARE_PROVIDER_SITE_OTHER): Payer: PPO | Admitting: Podiatry

## 2023-12-24 ENCOUNTER — Other Ambulatory Visit: Payer: Self-pay

## 2023-12-24 ENCOUNTER — Encounter: Payer: Self-pay | Admitting: Podiatry

## 2023-12-24 DIAGNOSIS — M79674 Pain in right toe(s): Secondary | ICD-10-CM

## 2023-12-24 DIAGNOSIS — M79675 Pain in left toe(s): Secondary | ICD-10-CM

## 2023-12-24 DIAGNOSIS — B351 Tinea unguium: Secondary | ICD-10-CM

## 2023-12-24 DIAGNOSIS — E1143 Type 2 diabetes mellitus with diabetic autonomic (poly)neuropathy: Secondary | ICD-10-CM

## 2023-12-24 NOTE — Progress Notes (Signed)
This patient presents to the office with chief complaint of long thick painful nails.  Patient says the nails are painful walking and wearing shoes.  This patient is unable to self treat.  This patient is unable to trim his nails since he is unable to reach his  nails.  She presents to the office for preventative foot care services.  General Appearance  Alert, conversant and in no acute stress.  Vascular  Dorsalis pedis and posterior tibial  pulses are palpable  bilaterally.  Capillary return is within normal limits  bilaterally. Temperature is within normal limits  bilaterally.  Neurologic  Senn-Weinstein monofilament wire test within normal limits  bilaterally. Muscle power within normal limits bilaterally.  Nails Thick disfigured discolored nails with subungual debris  from hallux to fifth toes bilaterally. No evidence of bacterial infection or drainage bilaterally.  Orthopedic  No limitations of motion  feet .  No crepitus or effusions noted.  No bony pathology or digital deformities noted.  Skin  normotropic skin with no porokeratosis noted bilaterally.  No signs of infections or ulcers noted.     Onychomycosis  Nails  B/L.  Pain in right toes  Pain in left toes  Debridement of nails both feet followed trimming the nails with dremel tool.    RTC 3 months.   Gardiner Barefoot DPM

## 2023-12-25 ENCOUNTER — Other Ambulatory Visit: Payer: Self-pay | Admitting: Student

## 2023-12-25 DIAGNOSIS — R1313 Dysphagia, pharyngeal phase: Secondary | ICD-10-CM

## 2023-12-29 ENCOUNTER — Other Ambulatory Visit

## 2023-12-30 NOTE — Telephone Encounter (Signed)
Diabetes support provided °

## 2023-12-31 ENCOUNTER — Other Ambulatory Visit: Payer: Self-pay

## 2023-12-31 DIAGNOSIS — Z961 Presence of intraocular lens: Secondary | ICD-10-CM | POA: Diagnosis not present

## 2023-12-31 DIAGNOSIS — H40053 Ocular hypertension, bilateral: Secondary | ICD-10-CM | POA: Diagnosis not present

## 2023-12-31 DIAGNOSIS — E119 Type 2 diabetes mellitus without complications: Secondary | ICD-10-CM | POA: Diagnosis not present

## 2023-12-31 DIAGNOSIS — H1045 Other chronic allergic conjunctivitis: Secondary | ICD-10-CM | POA: Diagnosis not present

## 2023-12-31 DIAGNOSIS — H04123 Dry eye syndrome of bilateral lacrimal glands: Secondary | ICD-10-CM | POA: Diagnosis not present

## 2023-12-31 LAB — HM DIABETES EYE EXAM

## 2024-01-19 ENCOUNTER — Ambulatory Visit: Payer: Self-pay

## 2024-01-19 NOTE — Telephone Encounter (Signed)
 Pt has an appt 6/5 with Dr Esaw Heckler.

## 2024-01-19 NOTE — Telephone Encounter (Signed)
 Chief Complaint: urinary frequency Symptoms: urinary frequency Frequency: about a month Pertinent Negatives: Patient denies urinary pain Disposition: [] ED /[] Urgent Care (no appt availability in office) / [x] Appointment(In office/virtual)/ []  [place] [facility]/ [] Home Care/ [] Refused Recommended Disposition /[] [place] [facility]/ []  Follow-up with PCP  Additional Notes: Pt states that he is experiencing urinary frequency. States that he was experiencing this in March and it got better. States around it started back around April 4-5. States that he is urinating more frequently. States he is having to go urinate around 4-5 times a night. State his blood glucose is running in the 130's.  Copied from CRM [id number]. Topic: Clinical - Red Word Triage >> Jan 19, 2024  9:19 AM Danelle Dunning F wrote: [facility] that prompted transfer to Nurse Triage:   Bladder concern   High frequency of urination; for over a week; no pain Reason for Disposition  Has to get out of bed to urinate > 2 times a night (i.e., nocturia)  Answer Assessment - Initial Assessment Questions 1. SYMPTOM: "What's the main symptom you're concerned about?" (e.g., frequency, incontinence)     frequency 2. ONSET: "When did the  frequency  start?"     April  3. PAIN: "Is there any pain?" If Yes, ask: "How bad is it?" (Scale: 1-10; mild, moderate, severe)     no 4. CAUSE: "What do you think is causing the symptoms?"     diabetes 5. OTHER SYMPTOMS: "Do you have any other symptoms?" (e.g., blood in urine, fever, flank pain, pain with urination)     no  Protocols used: Urinary Symptoms-A-AH

## 2024-01-22 ENCOUNTER — Encounter: Payer: Self-pay | Admitting: Internal Medicine

## 2024-01-22 ENCOUNTER — Other Ambulatory Visit: Payer: Self-pay

## 2024-01-22 ENCOUNTER — Ambulatory Visit (INDEPENDENT_AMBULATORY_CARE_PROVIDER_SITE_OTHER): Payer: Self-pay | Admitting: Internal Medicine

## 2024-01-22 ENCOUNTER — Other Ambulatory Visit: Payer: Self-pay | Admitting: Internal Medicine

## 2024-01-22 VITALS — BP 140/70 | HR 80 | Temp 98.1°F | Ht 72.0 in | Wt 228.8 lb

## 2024-01-22 DIAGNOSIS — Z794 Long term (current) use of insulin: Secondary | ICD-10-CM | POA: Diagnosis not present

## 2024-01-22 DIAGNOSIS — Z7984 Long term (current) use of oral hypoglycemic drugs: Secondary | ICD-10-CM | POA: Diagnosis not present

## 2024-01-22 DIAGNOSIS — Z7985 Long-term (current) use of injectable non-insulin antidiabetic drugs: Secondary | ICD-10-CM

## 2024-01-22 DIAGNOSIS — E1143 Type 2 diabetes mellitus with diabetic autonomic (poly)neuropathy: Secondary | ICD-10-CM | POA: Diagnosis not present

## 2024-01-22 DIAGNOSIS — N401 Enlarged prostate with lower urinary tract symptoms: Secondary | ICD-10-CM | POA: Diagnosis not present

## 2024-01-22 DIAGNOSIS — R350 Frequency of micturition: Secondary | ICD-10-CM

## 2024-01-22 LAB — GLUCOSE, CAPILLARY: Glucose-Capillary: 113 mg/dL — ABNORMAL HIGH (ref 70–99)

## 2024-01-22 LAB — POCT GLYCOSYLATED HEMOGLOBIN (HGB A1C): Hemoglobin A1C: 7.7 % — AB (ref 4.0–5.6)

## 2024-01-22 MED ORDER — TAMSULOSIN HCL 0.4 MG PO CAPS
0.8000 mg | ORAL_CAPSULE | Freq: Every day | ORAL | 3 refills | Status: AC
  Filled 2024-01-22: qty 180, 90d supply, fill #0
  Filled 2024-03-02 – 2024-04-23 (×3): qty 90, 45d supply, fill #0
  Filled 2024-05-25 – 2024-07-01 (×2): qty 90, 45d supply, fill #1
  Filled 2024-07-30 – 2024-09-17 (×3): qty 90, 45d supply, fill #2

## 2024-01-22 NOTE — Progress Notes (Unsigned)
 Subjective:  CC: urinary frequency  HPI:  [name] a 68 y.o. male with a past medical history stated below and presents today for above. Please see problem based assessment and plan for additional details.  Past Medical History:  Diagnosis Date   Asthma    CAD (coronary artery disease)    stent RCA 2006 (other residual disease). /  nuclear 2008  no ischemia, inferolateral scar.   Cataract    Chronic bronchitis (HCC)    "anytime I get sick it goes into bronchitis; probably get it q yr"   Daily headache    "just recently" (05/10/2014)   Dyslipidemia    Educated about COVID-19 virus infection 01/04/2019   Ejection fraction    EF 55%,echo, 2008, inferolateral hypokinesis,   Erectile dysfunction    Flu-like symptoms 09/30/2018   Gastroparesis    Hypertension    Myocardial infarction (HCC) 2006   Severe obstructive sleep apnea-hypopnea syndrome 04/15/2009   Sleep study 03/15/2009 AHI of 82.2/hr oxygen desaturation nadir of 77%, loud snoring. Did not tolerate CPAP due to mask.   Sprain of right ankle 07/10/2012   Tooth pain 05/23/2017   Type 2 diabetes mellitus with complications (HCC) dx'd 1972   Viral upper respiratory tract infection 05/21/2017   Vomiting 11/14/2016    Current Outpatient Medications on File Prior to Visit  Medication Sig Dispense Refill   albuterol  (VENTOLIN  HFA) 108 (90 Base) MCG/ACT inhaler Inhale 2 puffs into the lungs every 6 (six) hours as needed for wheezing or shortness of breath. 1 each 1   amLODipine -benazepril  (LOTREL ) 10-40 MG capsule Take 1 capsule by mouth daily. 90 capsule 3   Arginine 500 MG CAPS Take 500 mg by mouth daily.     aspirin  EC 81 MG tablet Take 1 tablet (81 mg total) by mouth daily.     Blood Glucose Monitoring Suppl (BAYER CONTOUR NEXT USB MONITOR) w/Device KIT Check blood sugar up to 6 times a day 1 kit 1   buPROPion  (WELLBUTRIN  XL) 300 MG 24 hr tablet Take 1 tablet (300 mg total) by mouth daily. 90 tablet 3   Continuous  Glucose Receiver (FREESTYLE LIBRE 3 READER) DEVI use as directed 1 each 3   Continuous Glucose Sensor (FREESTYLE LIBRE 3 SENSOR) MISC Place 1 sensor on the skin every 14 days. Use to check glucose continuously 2 each 3   COSOPT PF 22.3-6.8 MG/ML SOLN ophthalmic solution Place 1 drop into both eyes 2 (two) times daily.   3   cyanocobalamin  (VITAMIN B12) 1000 MCG tablet Take 1 tablet (1,000 mcg total) by mouth daily. 90 tablet 1   Diclofenac Sodium  (PENNSAID ) 2 % SOLN Apply 2 g topically 2 (two) times daily as needed (to affected area). 112 g 3   Dulaglutide  (TRULICITY ) 3 MG/0.5ML SOAJ Inject 3 mg into the skin once a week. 6 mL 3   empagliflozin  (JARDIANCE ) 10 MG TABS tablet Take 1 tablet (10 mg total) by mouth daily before breakfast. 90 tablet 3   Flaxseed, Linseed, (FLAX SEED OIL PO) Take 1 tablet by mouth daily.      fluticasone  (FLONASE ) 50 MCG/ACT nasal spray Place 2 sprays into both nostrils daily. 16 g 0   glucose blood (BAYER CONTOUR NEXT TEST) test strip Check blood sugar up to 6 times a day 200 each 5   hydrochlorothiazide  (HYDRODIURIL ) 25 MG tablet Take 1 tablet (25 mg total) by mouth daily. 90 tablet 3   insulin  aspart (NOVOLOG ) 100 UNIT/ML injection  Use to fill omnipod insulin  pump up to 90 units daily. 30 mL 11   Insulin  Disposable Pump (OMNIPOD DASH PODS, GEN 4,) MISC Fill with Novolog  insulin  and apply new pod about every 2 days 15 each 11   Insulin  Syringe-Needle U-100 (INSULIN  SYRINGE .5CC/31GX5/16") 31G X 5/16" 0.5 ML MISC Use to inject insulin  3 times a day Dx code 250.00 100 each 12   latanoprost  (XALATAN ) 0.005 % ophthalmic solution Place 1 drop into both eyes at bedtime.     latanoprost  (XALATAN ) 0.005 % ophthalmic solution Place 1 drop into both eyes daily. 7.5 mL 4   levocetirizine (XYZAL ) 5 MG tablet Take 1 tablet (5 mg total) by mouth every evening. 90 tablet 3   meloxicam  (MOBIC ) 15 MG tablet Take 1 tablet (15 mg total) by mouth daily as needed for pain (knee). 20 tablet  2   Misc. Devices (PULSE OXIMETER FOR FINGER) MISC Take pulse ox level in the morning and at night. If less than or equal to 94 please call our clinic. 1 each 0   ONETOUCH DELICA LANCETS 33G MISC Use to check blood sugars up to 4 times a day. Dx code:E11.65 200 each 11   polyethylene glycol-electrolytes (NULYTELY) 420 g solution Use as directed 4000 mL 0   rosuvastatin  (CRESTOR ) 5 MG tablet Take 1 tablet (5 mg total) by mouth daily. 90 tablet 3   tadalafil  (CIALIS ) 5 MG tablet Take 1 tablet (5 mg total) by mouth daily. 90 tablet 3   tamsulosin  (FLOMAX ) 0.4 MG CAPS capsule Take 1 capsule (0.4 mg total) by mouth daily after breakfast. 90 capsule 3   triamcinolone  (NASACORT  AQ) 55 MCG/ACT AERO nasal inhaler Place 1 spray into the nose 2 (two) times daily. 1 Inhaler 0   No current facility-administered medications on file prior to visit.   Review of Systems: ROS negative except for as is noted on the assessment and plan.  Objective:   Vitals:   01/22/24 1557  BP: (!) 140/70  Pulse: 80  Temp: 98.1 F (36.7 C)  TempSrc: Oral  SpO2: 98%  Weight: 228 lb 12.8 oz (103.8 kg)  Height: 6' (1.829 m)   Physical Exam: Constitutional: well-appearing, in no acute distress Cardiovascular: regular rate and rhythm Pulmonary/Chest: normal work of breathing on room air MSK: normal bulk and tone  Assessment & Plan:   No problem-specific Assessment & Plan notes found for this encounter.    Patient discussed with Dr. Gaylon Kea MD [facility] Internal [facility]  PGY-1 Pager: [phone] Date 01/22/2024  Time 4:07 PM

## 2024-01-22 NOTE — Patient Instructions (Addendum)
 Mr Sporn,   I suspect your urinary frequency is due to worsening of your prostatic hyperplasia. I am increasing your Flomax  to 0.8mg  (two tablets) daily. I also recommend making an appointment with your urologist.   I will call you with the results of the urine sample we are testing today.   We will plan to see you again in about 3 months to follow up with your diabetes and BPH.   Thanks,  Dr Esaw Heckler

## 2024-01-23 ENCOUNTER — Other Ambulatory Visit: Payer: Self-pay

## 2024-01-23 ENCOUNTER — Ambulatory Visit: Payer: Self-pay | Admitting: Internal Medicine

## 2024-01-23 ENCOUNTER — Encounter: Payer: Self-pay | Admitting: Internal Medicine

## 2024-01-23 LAB — URINALYSIS, ROUTINE W REFLEX MICROSCOPIC
Bilirubin, UA: NEGATIVE
Glucose, UA: NEGATIVE
Ketones, UA: NEGATIVE
Leukocytes,UA: NEGATIVE
Nitrite, UA: NEGATIVE
Protein,UA: NEGATIVE
RBC, UA: NEGATIVE
Specific Gravity, UA: 1.008 (ref 1.005–1.030)
Urobilinogen, Ur: 0.2 mg/dL (ref 0.2–1.0)
pH, UA: 6 (ref 5.0–7.5)

## 2024-01-23 MED ORDER — FREESTYLE LIBRE 3 SENSOR MISC
1.0000 | Freq: Every day | 3 refills | Status: DC
  Filled 2024-01-23 – 2024-03-02 (×2): qty 2, 28d supply, fill #0
  Filled 2024-03-02: qty 6, 84d supply, fill #1
  Filled 2024-03-02: qty 6, 84d supply, fill #0

## 2024-01-23 NOTE — Assessment & Plan Note (Signed)
 Patient reports worsening urinary frequency over the past several months. He describes uninterrupted large-volume urine, then having to go to the bathroom again within 20 minutes. Denies dysuria, change in urine color or quality, or flank pain. He was previously seen in clinic for same symptoms, at the time the provider discussed his symptoms are likely due to a combination of polyuria due to his diabetes and BPH. He was counseled to take his Flomax  and cialis  together for greater effect. A1c was 7.7 today from 8.4 previously. Since blood sugar control is improving, this patient's symptoms are more likely due to worsening of his BPH. We discussed increasing his Flomax  to 0.8mg  daily, and making an appointment with his urologist to discuss further options for management.  - A1c today - Urinalysis - Increase Flomax  to 0.8mg  daily, to be taken with Cialis  - Recommended scheduling with urology

## 2024-01-23 NOTE — Addendum Note (Signed)
 Addended by: Bevelyn Bryant on: 01/23/2024 09:26 AM   Modules accepted: Level of Service

## 2024-01-23 NOTE — Telephone Encounter (Signed)
 Medication sent to pharmacy

## 2024-01-23 NOTE — Progress Notes (Signed)
 Internal Medicine Clinic Attending  Case discussed with the resident at the time of the visit.  We reviewed the resident's history and exam and pertinent patient test results.  I agree with the assessment, diagnosis, and plan of care documented in the resident's note.

## 2024-01-30 ENCOUNTER — Other Ambulatory Visit: Payer: Self-pay

## 2024-02-02 ENCOUNTER — Other Ambulatory Visit: Payer: Self-pay

## 2024-02-03 ENCOUNTER — Other Ambulatory Visit: Payer: Self-pay

## 2024-02-04 ENCOUNTER — Other Ambulatory Visit: Payer: Self-pay

## 2024-02-05 ENCOUNTER — Other Ambulatory Visit: Payer: Self-pay

## 2024-02-17 ENCOUNTER — Telehealth: Payer: Self-pay | Admitting: Dietician

## 2024-02-17 NOTE — Telephone Encounter (Signed)
 Great, I like that plan

## 2024-02-17 NOTE — Telephone Encounter (Signed)
 Went back to work, sugar is dropping to 50, 80, now going in with sugar 300s so it won't drop. Works ~ 4 hours at a time: 3pm-7pm, 830 pm to 1230am. Suggest  using a temporary basal for 4 hours x 0.75%.  he is coming in next week to see Dr. Renne. I scheduled an appointment with me at the same day to assist as needed. He agreed to try to eat before work and take less insulin  for food and carry sweet tarts to treat/prevent lows in the meantime. He also talked about working on getting a CPAP for better sleep.

## 2024-02-26 ENCOUNTER — Ambulatory Visit
Admission: RE | Admit: 2024-02-26 | Discharge: 2024-02-26 | Disposition: A | Discharge status: Home / Self Care | Source: Ambulatory Visit | Attending: Student | Admitting: Student

## 2024-02-26 ENCOUNTER — Encounter: Payer: Self-pay | Admitting: Dietician

## 2024-02-26 ENCOUNTER — Ambulatory Visit: Payer: Self-pay | Admitting: Student

## 2024-02-26 DIAGNOSIS — R1313 Dysphagia, pharyngeal phase: Secondary | ICD-10-CM

## 2024-03-02 ENCOUNTER — Telehealth: Payer: Self-pay | Admitting: *Deleted

## 2024-03-02 ENCOUNTER — Other Ambulatory Visit: Payer: Self-pay | Admitting: Internal Medicine

## 2024-03-02 ENCOUNTER — Encounter: Payer: Self-pay | Admitting: Student

## 2024-03-02 ENCOUNTER — Ambulatory Visit: Payer: Self-pay | Admitting: Student

## 2024-03-02 ENCOUNTER — Other Ambulatory Visit: Payer: Self-pay

## 2024-03-02 ENCOUNTER — Other Ambulatory Visit (HOSPITAL_COMMUNITY): Payer: Self-pay

## 2024-03-02 ENCOUNTER — Ambulatory Visit: Admitting: Dietician

## 2024-03-02 VITALS — BP 134/71 | HR 76 | Temp 98.0°F | Ht 72.0 in | Wt 224.0 lb

## 2024-03-02 DIAGNOSIS — F339 Major depressive disorder, recurrent, unspecified: Secondary | ICD-10-CM | POA: Diagnosis not present

## 2024-03-02 DIAGNOSIS — Z7985 Long-term (current) use of injectable non-insulin antidiabetic drugs: Secondary | ICD-10-CM | POA: Diagnosis not present

## 2024-03-02 DIAGNOSIS — Z7984 Long term (current) use of oral hypoglycemic drugs: Secondary | ICD-10-CM | POA: Diagnosis not present

## 2024-03-02 DIAGNOSIS — I10 Essential (primary) hypertension: Secondary | ICD-10-CM

## 2024-03-02 DIAGNOSIS — E119 Type 2 diabetes mellitus without complications: Secondary | ICD-10-CM | POA: Diagnosis not present

## 2024-03-02 DIAGNOSIS — E1143 Type 2 diabetes mellitus with diabetic autonomic (poly)neuropathy: Secondary | ICD-10-CM

## 2024-03-02 LAB — GLUCOSE, CAPILLARY
Glucose-Capillary: 208 mg/dL — ABNORMAL HIGH (ref 70–99)
Glucose-Capillary: 180 mg/dL — ABNORMAL HIGH (ref 70–99)

## 2024-03-02 MED ORDER — INSULIN ASPART 100 UNIT/ML IJ SOLN
90.0000 [IU] | Freq: Every day | INTRAMUSCULAR | 6 refills | Status: AC
  Filled 2024-03-02: qty 30, 33d supply, fill #0
  Filled 2024-03-31 – 2024-04-23 (×2): qty 30, 33d supply, fill #1
  Filled 2024-05-25: qty 30, 33d supply, fill #2
  Filled 2024-07-01: qty 30, 33d supply, fill #3
  Filled 2024-07-30: qty 30, 33d supply, fill #4
  Filled 2024-09-17: qty 30, 33d supply, fill #5

## 2024-03-02 NOTE — Assessment & Plan Note (Addendum)
 BP Readings from Last 3 Encounters:  03/02/24 134/71  01/22/24 (!) 140/70  11/13/23 (!) 142/67  Tyler Wilkinson has a history of hypertension and presented to the office to discuss his chronic health issues. He currently reports no acute concerns. During the interview, he disclosed that he has not been taking his antihypertensive medications for the past 2 to 3 weeks due to a recent work trip during which he did not have access to his medications. He expressed curiosity about why his blood pressure remains at 134/71 despite being off his medications and questioned whether he still needs to take them. I discussed with him the importance of maintaining adequate blood pressure control, especially given his history of coronary artery disease. The patient was educated on blood pressure targets and appeared to understand the significance of medication adherence. Considering that he has been off his medications for 2 to 3 weeks yet maintains a blood pressure of 134/71 in the office, I also questioned the necessity of all his current antihypertensive agents. At this time, I believe it is reasonable to discontinue hydrochlorothiazide  and continue therapy with amlodipine -benazepril . For his next visit, please review his blood pressure trends to determine if reintroduction of the thiazide diuretic is warranted. Plan: Continue amlodipine -benazepril  10-40 mg daily Hold hydrochlorothiazide  25 mg daily

## 2024-03-02 NOTE — Assessment & Plan Note (Signed)
 Stable on bupropion . No active concerns at this time

## 2024-03-02 NOTE — Progress Notes (Cosign Needed Addendum)
 Mr. Rock is considereing an insulin  pump upgrade. He may need to change to Dexcom G7 to integrate with Omnipod 5. He will investigate his insurance coverage.   His Omnipod Dash insulin  pump was uploaded today. Unfortunately because he enters false glucose values, the information is not very helpful.  We put in a temporary basal present for 4 hours of work with a 10% basal reduction  He requests refills of Freestyle libre 3 plus rx, Novolog  for pump, strips for meter( unsure what kind) CBG is 208, 4.2 units on board.   Changes to pump settings approved by Dr. Amoako today and includes correct above values and duration of insulin  action.    Pump settings as of 03/02/2024 Max Basal Rate = 2.5U/hr 12am-12 AM Basal rate = 1.65 U/hr 12am-11 am Target Glucose : 120 mg/dL correct above 879 mg/dL 11 am -12 am Target Glucose : 120 mg/dL correct above 879 mg/dL [id number] am Insulin  to Carb ratio = 1 g/unit  12am -12 am Correction factor = 27 mg/dL/unit  Duration of insulin  action - 3 hrs Max Bolus= 25 Units BP Readings from Last 3 Encounters:  03/02/24 134/71  01/22/24 (!) 140/70  11/13/23 (!) 142/67   Wt Readings from Last 10 Encounters:  03/02/24 224 lb (101.6 kg)  01/22/24 228 lb 12.8 oz (103.8 kg)  11/13/23 225 lb 1.6 oz (102.1 kg)  09/11/23 222 lb 9.6 oz (101 kg)  09/10/23 223 lb 14.4 oz (101.6 kg)  06/10/23 223 lb 9.6 oz (101.4 kg)  05/09/23 221 lb 12.8 oz (100.6 kg)  05/08/23 221 lb 12.8 oz (100.6 kg)  03/10/23 216 lb (98 kg)  02/27/23 221 lb 6.4 oz (100.4 kg)   Lab Results  Component Value Date   HGBA1C 7.7 (A) 01/22/2024   HGBA1C 8.4 (A) 11/13/2023   HGBA1C 8.1 (A) 09/10/2023   HGBA1C 9.0 (A) 05/08/2023   HGBA1C 8.5 (A) 02/03/2023   Diabetes Self-Management Education  Visit Type: Annual Follow-Up  Appt. Start Time: 1515 Appt. End Time: 1545  03/02/2024  Mr. Alm Arlys, identified by name and date of birth, is a 68 y.o. male with a diagnosis of Diabetes: Type 2 .    ASSESSMENT     Diabetes Self-Management Education - 03/02/24 1600       Visit Information   Visit Type Annual Follow-Up      Psychosocial Assessment   Patient Belief/Attitude about Diabetes Motivated to manage diabetes    What is the hardest part about your diabetes right now, causing you the most concern, or is the most worrisome to you about your diabetes?   Getting support / problem solving;Taking/obtaining medications;Checking blood sugar    Self-care barriers Lack of material resources    Self-management support Family;CDE visits    Patient Concerns Glycemic Control    Special Needs None    Preferred Learning Style No preference indicated    Learning Readiness Contemplating    How often do you need to have someone help you when you read instructions, pamphlets, or other written materials from your doctor or pharmacy? 2 - Rarely    What is the last grade level you completed in school? 12      Pre-Education Assessment   Patient understands the diabetes disease and treatment process. Comprehends key points    Patient understands incorporating nutritional management into lifestyle. Comprehends key points    Patient undertands incorporating physical activity into lifestyle. Comprehends key points    Patient understands using medications safely. Needs Review  Patient understands monitoring blood glucose, interpreting and using results Comprehends key points    Patient understands prevention, detection, and treatment of acute complications. Comprehends key points    Patient understands prevention, detection, and treatment of chronic complications. Compreheands key points    Patient understands how to develop strategies to address psychosocial issues. Comprehends key points    Patient understands how to develop strategies to promote health/change behavior. Comprehends key points      Complications   Last HgB A1C per patient/outside source 7.7 %    How often do you check your  blood sugar? > 4 times/day    Fasting Blood glucose range (mg/dL) 29-870;869-820;819-799;>799    Postprandial Blood glucose range (mg/dL) >799    Number of hypoglycemic episodes per month 0    Number of hyperglycemic episodes ( >200mg /dL): Daily    Can you tell when your blood sugar is high? No    Have you had a dilated eye exam in the past 12 months? Yes    Are you checking your feet? Yes    How many days per week are you checking your feet? 7      Dietary Intake   Breakfast deferred today per patient request      Activity / Exercise   Activity / Exercise Type Light (walking / raking leaves);ADL's   works now   How many days per week do you exercise? 5    How many minutes per day do you exercise? 60    Total minutes per week of exercise 300      Patient Education   Previous Diabetes Education Yes   here   Medications Other (comment);Reviewed patients medication for diabetes, action, purpose, timing of dose and side effects.   discussed changes to insulin  pump, insulin  pump download and use of tempoary basal when he goes to work for 4 hours     Individualized Goals (developed by patient)   Medications take my medication as prescribed      Patient Self-Evaluation of Goals - Patient rates self as meeting previously set goals (% of time)   Medications >75% (most of the time)      Post-Education Assessment   Patient understands using medications safely. Comphrehends key points      Outcomes   Expected Outcomes Demonstrated interest in learning. Expect positive outcomes    Future DMSE 6 months    Program Status Completed      Subsequent Visit   Since your last visit have you continued or begun to take your medications as prescribed? Yes    Since your last visit have you had your blood pressure checked? Yes    Is your most recent blood pressure lower, unchanged, or higher since your last visit? Lower    Since your last visit have you experienced any weight changes? No change     Since your last visit, are you checking your blood glucose at least once a day? No   ran out fo CGM sensors and strips         Individualized Plan for Diabetes Self-Management Training:   Learning Objective:  Patient will have a greater understanding of diabetes self-management. Patient education plan is to attend individual and/or group sessions per assessed needs and concerns.   Plan:   There are no Patient Instructions on file for this visit.  Expected Outcomes:  Demonstrated interest in learning. Expect positive outcomes  Education material provided: Diabetes Resources  If problems or questions, patient to contact  team via:  Phone  Future DSME appointment: 6 months Arland Hole, RD 03/02/2024 4:14 PM.

## 2024-03-02 NOTE — Telephone Encounter (Signed)
With patient now

## 2024-03-02 NOTE — Patient Instructions (Addendum)
 Thank you, [name], for allowing us  to provide your care today. During our visit, we discussed your overall health.  I recommend that you resume taking your medications as prescribed, as it is important for your well-being.  Based on the information you shared today, I am discontinuing your hydrochlorothiazide . Please continue taking your Lotrel  as directed.     I have ordered the following labs for you:  Lab Orders  No laboratory test(s) ordered today     Tests ordered today:    Referrals ordered today:   Referral Orders  No referral(s) requested today     I have ordered the following medication/changed the following medications:   Stop the following medications: There are no discontinued medications.   Start the following medications: No orders of the defined types were placed in this encounter.    Follow up: 3-4 months    Should you have any questions or concerns please call the internal medicine clinic at [phone].   Drue Lisa Grow MD 03/02/2024, 4:11 PM   [facility]

## 2024-03-02 NOTE — Progress Notes (Signed)
 CC: Routine follow up   HPI:  [name] a 68 y.o. male living with a history stated below and presents today for office visit . Please see problem based assessment and plan for additional details.  Past Medical History:  Diagnosis Date   Asthma    CAD (coronary artery disease)    stent RCA 2006 (other residual disease). /  nuclear 2008  no ischemia, inferolateral scar.   Cataract    Chronic bronchitis (HCC)    anytime I get sick it goes into bronchitis; probably get it q yr   Daily headache    just recently (05/10/2014)   Dyslipidemia    Educated about COVID-19 virus infection 01/04/2019   Ejection fraction    EF 55%,echo, 2008, inferolateral hypokinesis,   Erectile dysfunction    Flu-like symptoms 09/30/2018   Gastroparesis    Hypertension    Myocardial infarction (HCC) 2006   Severe obstructive sleep apnea-hypopnea syndrome 04/15/2009   Sleep study 03/15/2009 AHI of 82.2/hr oxygen desaturation nadir of 77%, loud snoring. Did not tolerate CPAP due to mask.   Sprain of right ankle 07/10/2012   Tooth pain 05/23/2017   Type 2 diabetes mellitus with complications (HCC) dx'd 1972   Viral upper respiratory tract infection 05/21/2017   Vomiting 11/14/2016    Current Outpatient Medications on File Prior to Visit  Medication Sig Dispense Refill   albuterol  (VENTOLIN  HFA) 108 (90 Base) MCG/ACT inhaler Inhale 2 puffs into the lungs every 6 (six) hours as needed for wheezing or shortness of breath. 1 each 1   amLODipine -benazepril  (LOTREL ) 10-40 MG capsule Take 1 capsule by mouth daily. 90 capsule 3   Arginine 500 MG CAPS Take 500 mg by mouth daily.     aspirin  EC 81 MG tablet Take 1 tablet (81 mg total) by mouth daily.     Blood Glucose Monitoring Suppl (BAYER CONTOUR NEXT USB MONITOR) w/Device KIT Check blood sugar up to 6 times a day 1 kit 1   buPROPion  (WELLBUTRIN  XL) 300 MG 24 hr tablet Take 1 tablet (300 mg total) by mouth daily. 90 tablet 3   Continuous Glucose  Receiver (FREESTYLE LIBRE 3 READER) DEVI use as directed 1 each 3   Continuous Glucose Sensor (FREESTYLE LIBRE 3 SENSOR) MISC Place 1 sensor on the skin every 14 days. Use to check glucose continuously 2 each 3   COSOPT PF 22.3-6.8 MG/ML SOLN ophthalmic solution Place 1 drop into both eyes 2 (two) times daily.   3   cyanocobalamin  (VITAMIN B12) 1000 MCG tablet Take 1 tablet (1,000 mcg total) by mouth daily. 90 tablet 1   Diclofenac Sodium  (PENNSAID ) 2 % SOLN Apply 2 g topically 2 (two) times daily as needed (to affected area). 112 g 3   Dulaglutide  (TRULICITY ) 3 MG/0.5ML SOAJ Inject 3 mg into the skin once a week. 6 mL 3   empagliflozin  (JARDIANCE ) 10 MG TABS tablet Take 1 tablet (10 mg total) by mouth daily before breakfast. 90 tablet 3   Flaxseed, Linseed, (FLAX SEED OIL PO) Take 1 tablet by mouth daily.      fluticasone  (FLONASE ) 50 MCG/ACT nasal spray Place 2 sprays into both nostrils daily. 16 g 0   glucose blood (BAYER CONTOUR NEXT TEST) test strip Check blood sugar up to 6 times a day 200 each 5   hydrochlorothiazide  (HYDRODIURIL ) 25 MG tablet Take 1 tablet (25 mg total) by mouth daily. 90 tablet 3   Insulin  Disposable Pump (OMNIPOD DASH PODS,  GEN 4,) MISC Fill with Novolog  insulin  and apply new pod about every 2 days 15 each 11   Insulin  Syringe-Needle U-100 (INSULIN  SYRINGE .5CC/31GX5/16) 31G X 5/16 0.5 ML MISC Use to inject insulin  3 times a day Dx code 250.00 100 each 12   latanoprost  (XALATAN ) 0.005 % ophthalmic solution Place 1 drop into both eyes at bedtime.     latanoprost  (XALATAN ) 0.005 % ophthalmic solution Place 1 drop into both eyes daily. 7.5 mL 4   levocetirizine (XYZAL ) 5 MG tablet Take 1 tablet (5 mg total) by mouth every evening. 90 tablet 3   meloxicam  (MOBIC ) 15 MG tablet Take 1 tablet (15 mg total) by mouth daily as needed for pain (knee). 20 tablet 2   Misc. Devices (PULSE OXIMETER FOR FINGER) MISC Take pulse ox level in the morning and at night. If less than or equal  to 94 please call our clinic. 1 each 0   ONETOUCH DELICA LANCETS 33G MISC Use to check blood sugars up to 4 times a day. Dx code:E11.65 200 each 11   polyethylene glycol-electrolytes (NULYTELY) 420 g solution Use as directed 4000 mL 0   rosuvastatin  (CRESTOR ) 5 MG tablet Take 1 tablet (5 mg total) by mouth daily. 90 tablet 3   tadalafil  (CIALIS ) 5 MG tablet Take 1 tablet (5 mg total) by mouth daily. 90 tablet 3   tamsulosin  (FLOMAX ) 0.4 MG CAPS capsule Take 2 capsules (0.8 mg total) by mouth daily after breakfast. 90 capsule 3   triamcinolone  (NASACORT  AQ) 55 MCG/ACT AERO nasal inhaler Place 1 spray into the nose 2 (two) times daily. 1 Inhaler 0   No current facility-administered medications on file prior to visit.    Family History  Problem Relation Age of Onset   Diabetes Mother    Alcoholism Father     Social History   Socioeconomic History   Marital status: Single    Spouse name: Not on file   Number of children: Not on file   Years of education: 9   Highest education level: Not on file  Occupational History   Occupation: security guard in college    Employer: [employer]  Tobacco Use   Smoking status: Former    Current packs/day: 0.00    Types: Cigarettes    Start date: 11/14/1972    Quit date: 01/26/1973    Years since quitting: 51.1   Smokeless tobacco: Never  Vaping Use   Vaping status: Never Used  Substance and Sexual Activity   Alcohol use: Not Currently   Drug use: Yes    Types: Marijuana    Comment: Occasionally.   Sexual activity: Not on file  Other Topics Concern   Not on file  Social History Narrative   Right handed   One story home   Drinks caffeine    Social Drivers of Health   Financial Resource Strain: Not on file  Food Insecurity: No Food Insecurity (09/05/2022)   Hunger Vital Sign    Worried About Running Out of Food in the Last Year: Never true    Ran Out of Food in the Last Year: Never true  Transportation Needs: Not on file   Physical Activity: Not on file  Stress: Not on file  Social Connections: Socially Isolated (09/05/2022)   Social Connection and Isolation Panel    Frequency of Communication with Friends and Family: More than three times a week    Frequency of Social Gatherings with Friends and Family: Twice a week  Attends Religious Services: Never    Active Member of Clubs or Organizations: No    Attends Banker Meetings: Never    Marital Status: Separated  Intimate Partner Violence: Not At Risk (09/05/2022)   Humiliation, Afraid, Rape, and Kick questionnaire    Fear of Current or Ex-Partner: No    Emotionally Abused: No    Physically Abused: No    Sexually Abused: No    Review of Systems: ROS negative except for what is noted on the assessment and plan.  Vitals:   03/02/24 1516  BP: 134/71  Pulse: 76  Temp: 98 F (36.7 C)  TempSrc: Oral  SpO2: 94%  Weight: 224 lb (101.6 kg)  Height: 6' (1.829 m)    Physical Exam: Constitutional: well-appearing man, sitting in chair, in no acute distress Cardiovascular: regular rate and rhythm, no m/r/g Pulmonary/Chest: normal work of breathing on room air, lungs clear to auscultation bilaterally MSK: normal bulk and tone Neurological: alert & oriented x 3, no focal deficit Skin: warm and dry Psych: normal mood and behavior  Assessment & Plan:   Major depression, recurrent, chronic (HCC) Stable on bupropion . No active concerns at this time   Essential hypertension BP Readings from Last 3 Encounters:  03/02/24 134/71  01/22/24 (!) 140/70  11/13/23 (!) 142/67  Mr. Sortor has a history of hypertension and presented to the office to discuss his chronic health issues. He currently reports no acute concerns. During the interview, he disclosed that he has not been taking his antihypertensive medications for the past 2 to 3 weeks due to a recent work trip during which he did not have access to his medications. He expressed curiosity about  why his blood pressure remains at 134/71 despite being off his medications and questioned whether he still needs to take them. I discussed with him the importance of maintaining adequate blood pressure control, especially given his history of coronary artery disease. The patient was educated on blood pressure targets and appeared to understand the significance of medication adherence. Considering that he has been off his medications for 2 to 3 weeks yet maintains a blood pressure of 134/71 in the office, I also questioned the necessity of all his current antihypertensive agents. At this time, I believe it is reasonable to discontinue hydrochlorothiazide  and continue therapy with amlodipine -benazepril . For his next visit, please review his blood pressure trends to determine if reintroduction of the thiazide diuretic is warranted. 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 He presented today to discuss ongoing management and OmniPod maintenance with the diabetes coordinator. I would like to acknowledge Donna's valuable collaboration in the patient's care.His hemoglobin A1c was 7.7% one month ago when Jardiance  was initiated. The patient inquired about the necessity of a Jardiance  for his diabetes control. I counseled him on the renal protective benefits of Jardiance , particularly given his increased microalbuminuria of 66 noted in September of last year. The patient understood the information, expressed appreciation for the counseling, and committed to medication adherence moving forward. Plan: Continue Trulicity  Continue Jardiance  Continue OmniPod Follow up with diabetes coordinator as needed Recheck A1c in September      Patient discussed with Dr. Lovie Drue Grow,  M.D [facility] Phone: [phone] Date 03/02/2024 Time 5:12 PM

## 2024-03-02 NOTE — Assessment & Plan Note (Signed)
 This patient, with a history of diabetes, is currently managed with OmniPod, Trulicity , and Jardiance . He presented today to discuss ongoing management and OmniPod maintenance with the diabetes coordinator. I would like to acknowledge Donna's valuable collaboration in the patient's care.His hemoglobin A1c was 7.7% one month ago when Jardiance  was initiated. The patient inquired about the necessity of a Jardiance  for his diabetes control. I counseled him on the renal protective benefits of Jardiance , particularly given his increased microalbuminuria of 66 noted in September of last year. The patient understood the information, expressed appreciation for the counseling, and committed to medication adherence moving forward. Plan: Continue Trulicity  Continue Jardiance  Continue OmniPod Follow up with diabetes coordinator as needed Recheck A1c in September

## 2024-03-02 NOTE — Telephone Encounter (Signed)
 Copied from CRM [id number]. Topic: General - Other >> Mar 02, 2024  1:51 PM Miquel SAILOR wrote: Reason for CRM: Patient needs call back before his visit only wants to ask Arland Plyler [phone]

## 2024-03-03 ENCOUNTER — Other Ambulatory Visit: Payer: Self-pay

## 2024-03-03 NOTE — Progress Notes (Signed)
 Internal Medicine Clinic Attending  Case discussed with the resident at the time of the visit.  We reviewed the resident's history and exam and pertinent patient test results.  I agree with the assessment, diagnosis, and plan of care documented in the resident's note.

## 2024-03-05 ENCOUNTER — Other Ambulatory Visit: Payer: Self-pay

## 2024-03-11 ENCOUNTER — Other Ambulatory Visit

## 2024-03-12 NOTE — Progress Notes (Unsigned)
 na

## 2024-03-16 ENCOUNTER — Ambulatory Visit: Admitting: Podiatry

## 2024-03-16 ENCOUNTER — Encounter: Payer: Self-pay | Admitting: Podiatry

## 2024-03-16 DIAGNOSIS — B351 Tinea unguium: Secondary | ICD-10-CM | POA: Diagnosis not present

## 2024-03-16 DIAGNOSIS — M79675 Pain in left toe(s): Secondary | ICD-10-CM | POA: Diagnosis not present

## 2024-03-16 DIAGNOSIS — E1143 Type 2 diabetes mellitus with diabetic autonomic (poly)neuropathy: Secondary | ICD-10-CM | POA: Diagnosis not present

## 2024-03-16 DIAGNOSIS — M79674 Pain in right toe(s): Secondary | ICD-10-CM | POA: Diagnosis not present

## 2024-03-16 NOTE — Progress Notes (Signed)
This patient presents to the office with chief complaint of long thick painful nails.  Patient says the nails are painful walking and wearing shoes.  This patient is unable to self treat.  This patient is unable to trim his nails since he is unable to reach his  nails.  She presents to the office for preventative foot care services.  General Appearance  Alert, conversant and in no acute stress.  Vascular  Dorsalis pedis and posterior tibial  pulses are palpable  bilaterally.  Capillary return is within normal limits  bilaterally. Temperature is within normal limits  bilaterally.  Neurologic  Senn-Weinstein monofilament wire test within normal limits  bilaterally. Muscle power within normal limits bilaterally.  Nails Thick disfigured discolored nails with subungual debris  from hallux to fifth toes bilaterally. No evidence of bacterial infection or drainage bilaterally.  Orthopedic  No limitations of motion  feet .  No crepitus or effusions noted.  No bony pathology or digital deformities noted.  Skin  normotropic skin with no porokeratosis noted bilaterally.  No signs of infections or ulcers noted.     Onychomycosis  Nails  B/L.  Pain in right toes  Pain in left toes  Debridement of nails both feet followed trimming the nails with dremel tool.    RTC 3 months.   Gardiner Barefoot DPM

## 2024-03-26 ENCOUNTER — Other Ambulatory Visit: Payer: Self-pay

## 2024-03-29 ENCOUNTER — Other Ambulatory Visit: Payer: Self-pay

## 2024-04-02 ENCOUNTER — Other Ambulatory Visit: Payer: Self-pay

## 2024-04-05 ENCOUNTER — Ambulatory Visit: Payer: Self-pay | Admitting: Internal Medicine

## 2024-04-06 ENCOUNTER — Other Ambulatory Visit: Payer: Self-pay

## 2024-04-07 ENCOUNTER — Other Ambulatory Visit: Payer: Self-pay

## 2024-04-09 ENCOUNTER — Other Ambulatory Visit: Payer: Self-pay

## 2024-04-12 ENCOUNTER — Other Ambulatory Visit: Payer: Self-pay

## 2024-04-23 ENCOUNTER — Other Ambulatory Visit: Payer: Self-pay

## 2024-04-25 ENCOUNTER — Other Ambulatory Visit: Payer: Self-pay

## 2024-04-26 ENCOUNTER — Other Ambulatory Visit: Payer: Self-pay

## 2024-04-26 ENCOUNTER — Telehealth: Payer: Self-pay

## 2024-04-26 NOTE — Telephone Encounter (Signed)
 Prior Authorization for patient (Tadalafil  5MG  tablets) came through on cover my meds was submitted with last office notes awaiting approval or denial.  [id number]

## 2024-04-27 ENCOUNTER — Other Ambulatory Visit: Payer: Self-pay

## 2024-04-27 ENCOUNTER — Other Ambulatory Visit (HOSPITAL_COMMUNITY): Payer: Self-pay

## 2024-04-27 NOTE — Telephone Encounter (Signed)
 Prior authorization submitted for TADALAFIL  5MG  to [facility] [employer]/RX ADV[employer] via Latent.   Key: [id number]

## 2024-04-28 ENCOUNTER — Other Ambulatory Visit (HOSPITAL_COMMUNITY): Payer: Self-pay

## 2024-04-28 NOTE — Telephone Encounter (Signed)
Thank you Camille! 

## 2024-04-28 NOTE — Telephone Encounter (Signed)
 Pharmacy Patient Advocate Encounter  Received notification from [employer] that Prior Authorization for TADALAFIL  has been APPROVED from 04/27/24 to 04/27/25   PA #/Case ID/Reference #: [id number]  COPAY $0

## 2024-05-03 ENCOUNTER — Telehealth: Payer: Self-pay | Admitting: Internal Medicine

## 2024-05-03 NOTE — Telephone Encounter (Signed)
 Message has been forwarded to [facility] and the triage nurse to assist patient.  Copied from CRM #[id number]. Topic: General - Other >> Apr 28, 2024  1:20 PM Carrielelia G wrote: Attn: Arland Plyer  Please call regarding diabetes  and  Insulin  Disposable Pump (OMNIPOD DASH PODS, GEN 4,) MISC

## 2024-05-04 NOTE — Telephone Encounter (Signed)
 Left voicemail for return call to main number as I am out of the office after today until next week.

## 2024-05-06 ENCOUNTER — Inpatient Hospital Stay: Admission: RE | Admit: 2024-05-06 | Source: Ambulatory Visit

## 2024-05-06 ENCOUNTER — Other Ambulatory Visit: Payer: Self-pay

## 2024-05-17 ENCOUNTER — Ambulatory Visit: Payer: Self-pay | Admitting: Internal Medicine

## 2024-05-25 ENCOUNTER — Other Ambulatory Visit: Payer: Self-pay | Admitting: Internal Medicine

## 2024-05-25 ENCOUNTER — Other Ambulatory Visit: Payer: Self-pay

## 2024-05-25 ENCOUNTER — Other Ambulatory Visit (HOSPITAL_COMMUNITY): Payer: Self-pay

## 2024-05-25 DIAGNOSIS — E1143 Type 2 diabetes mellitus with diabetic autonomic (poly)neuropathy: Secondary | ICD-10-CM

## 2024-05-26 ENCOUNTER — Other Ambulatory Visit: Payer: Self-pay

## 2024-05-26 MED ORDER — OMNIPOD DASH PODS (GEN 4) MISC
11 refills | Status: AC
  Filled 2024-05-26: qty 15, 30d supply, fill #0
  Filled 2024-07-01 (×2): qty 15, 30d supply, fill #1
  Filled 2024-07-30: qty 15, 30d supply, fill #2
  Filled 2024-09-17: qty 15, 30d supply, fill #3

## 2024-05-26 MED ORDER — FREESTYLE LIBRE 3 SENSOR MISC
1.0000 | Freq: Every day | 3 refills | Status: DC
  Filled 2024-05-26: qty 2, 28d supply, fill #0
  Filled 2024-07-01: qty 2, 28d supply, fill #1

## 2024-05-26 NOTE — Telephone Encounter (Signed)
 Medication sent to pharmacy

## 2024-06-01 ENCOUNTER — Other Ambulatory Visit: Payer: Self-pay

## 2024-06-10 ENCOUNTER — Inpatient Hospital Stay: Admission: RE | Admit: 2024-06-10 | Source: Ambulatory Visit

## 2024-06-14 ENCOUNTER — Ambulatory Visit: Payer: Self-pay | Admitting: Internal Medicine

## 2024-06-14 ENCOUNTER — Other Ambulatory Visit (HOSPITAL_COMMUNITY)
Admission: RE | Admit: 2024-06-14 | Discharge: 2024-06-14 | Disposition: A | Discharge status: Home / Self Care | Source: Ambulatory Visit | Attending: Internal Medicine | Admitting: Internal Medicine

## 2024-06-14 ENCOUNTER — Encounter: Payer: Self-pay | Admitting: Internal Medicine

## 2024-06-14 VITALS — BP 166/67 | HR 74 | Temp 97.3°F | Wt 226.0 lb

## 2024-06-14 DIAGNOSIS — R350 Frequency of micturition: Secondary | ICD-10-CM

## 2024-06-14 DIAGNOSIS — Z7984 Long term (current) use of oral hypoglycemic drugs: Secondary | ICD-10-CM

## 2024-06-14 DIAGNOSIS — Z23 Encounter for immunization: Secondary | ICD-10-CM | POA: Diagnosis not present

## 2024-06-14 DIAGNOSIS — E538 Deficiency of other specified B group vitamins: Secondary | ICD-10-CM | POA: Diagnosis not present

## 2024-06-14 DIAGNOSIS — E1143 Type 2 diabetes mellitus with diabetic autonomic (poly)neuropathy: Secondary | ICD-10-CM | POA: Diagnosis not present

## 2024-06-14 DIAGNOSIS — N4889 Other specified disorders of penis: Secondary | ICD-10-CM | POA: Diagnosis not present

## 2024-06-14 DIAGNOSIS — N401 Enlarged prostate with lower urinary tract symptoms: Secondary | ICD-10-CM | POA: Diagnosis not present

## 2024-06-14 DIAGNOSIS — Z833 Family history of diabetes mellitus: Secondary | ICD-10-CM

## 2024-06-14 DIAGNOSIS — I10 Essential (primary) hypertension: Secondary | ICD-10-CM | POA: Diagnosis not present

## 2024-06-14 DIAGNOSIS — Z87891 Personal history of nicotine dependence: Secondary | ICD-10-CM

## 2024-06-14 DIAGNOSIS — Z79899 Other long term (current) drug therapy: Secondary | ICD-10-CM

## 2024-06-14 LAB — POCT GLYCOSYLATED HEMOGLOBIN (HGB A1C): HbA1c, POC (controlled diabetic range): 8 % — AB (ref 0.0–7.0)

## 2024-06-14 LAB — GLUCOSE, CAPILLARY: Glucose-Capillary: 235 mg/dL — ABNORMAL HIGH (ref 70–99)

## 2024-06-14 MED ORDER — CYANOCOBALAMIN 1000 MCG/ML IJ SOLN
1000.0000 ug | Freq: Once | INTRAMUSCULAR | Status: AC
  Administered 2024-06-14: 1000 ug via INTRAMUSCULAR

## 2024-06-14 NOTE — Patient Instructions (Signed)
 I want you to give Dr Evetta office a call [facility]) about a follow up appointment for you prostate symptoms.  [phone]

## 2024-06-14 NOTE — Assessment & Plan Note (Signed)
  Orders:   Vitamin B12

## 2024-06-14 NOTE — Assessment & Plan Note (Signed)
 Tyler Wilkinson

## 2024-06-14 NOTE — Assessment & Plan Note (Signed)
  Orders:   POC Hbg A1C   Comprehensive metabolic panel with GFR   Lipid Profile   CBC no Diff   Microalbumin / creatinine urine ratio

## 2024-06-14 NOTE — Progress Notes (Signed)
 " Subjective:  HPI: Chief Complaint  Patient presents with   Medical Management of Chronic Issues   Urinary Frequency    Discussed the use of AI scribe software for clinical note transcription with the patient, who gave verbal consent to proceed.  History of Present Illness Tyler Wilkinson is a 68 year old male with hypertension and diabetes who presents with urinary frequency and concerns about prostate health.  He experiences urinary frequency, needing to urinate every 30 to 40 minutes during the day and sometimes every hour at night. He describes his urination as 'peeing good' but is concerned about his prostate health. He is currently taking Flomax  and Cialis . A previous urologist noted a slightly swollen prostate, and the patient recalls being told by another doctor that recent tests 'came back all right.'  He has a history of hypertension and was surprised by a previous blood pressure reading of 'one thirty something'. He is inconsistent with his medication regimen due to returning to work. He recently started Crestor  5 mg for cholesterol management but occasionally misses doses. He has noticed a difference since starting the medication.  He manages his diabetes with Jardiance , which helps with urinary frequency but raises concerns about potential yeast infections. He is monitoring his sugar levels more closely and inquires about his A1c results.  He reports itching around his groin area, including his 'balls' and the head of his penis, suspecting it might be due to detergent. He also mentions frequent hand washing and sanitizer use, leading to dry and cracked hands.  He shares a history of grief and depression, particularly after the death of close friends. Returning to work three days a week has been beneficial for his mental health. He experiences significant foot pain, especially after shifts at [employer], which sometimes makes it difficult to walk.     Please see Assessment and Plan  below for the status of his chronic medical problems.  Objective:  Physical Exam: Vitals:   06/14/24 1056 06/14/24 1057  BP: (!) 171/80 (!) 166/67  Pulse: 74 74  Temp: (!) 97.3 F (36.3 C)   TempSrc: Oral   SpO2: 99%   Weight: 226 lb (102.5 kg)    Body mass index is 30.65 kg/m. Physical Exam Constitutional:      Appearance: Normal appearance.  Cardiovascular:     Rate and Rhythm: Normal rate and regular rhythm.  Pulmonary:     Effort: Pulmonary effort is normal.     Breath sounds: Normal breath sounds.  Genitourinary:    Pubic Area: No rash.      Penis: Normal. No erythema, swelling or lesions.      Testes: Normal.        Left: Tenderness not present.  Neurological:     Mental Status: He is alert.  Psychiatric:        Mood and Affect: Mood normal.    Results   Results for orders placed or performed in visit on 06/14/24  Comprehensive metabolic panel with GFR  Result Value Ref Range   Glucose 227 (H) 70 - 99 mg/dL   BUN 14 8 - 27 mg/dL   Creatinine, Ser 8.97 0.76 - 1.27 mg/dL   eGFR 80 >40 fO/fpw/8.26   BUN/Creatinine Ratio 14 10 - 24   Sodium 137 134 - 144 mmol/L   Potassium 4.2 3.5 - 5.2 mmol/L   Chloride 105 96 - 106 mmol/L   CO2 21 20 - 29 mmol/L   Calcium  9.0 8.6 - 10.2 mg/dL  Total Protein 6.2 6.0 - 8.5 g/dL   Albumin 4.2 3.9 - 4.9 g/dL   Globulin, Total 2.0 1.5 - 4.5 g/dL   Bilirubin Total 0.4 0.0 - 1.2 mg/dL   Alkaline Phosphatase 120 47 - 123 IU/L   AST 24 0 - 40 IU/L   ALT 30 0 - 44 IU/L  Lipid Profile  Result Value Ref Range   Cholesterol, Total 128 100 - 199 mg/dL   Triglycerides 83 0 - 149 mg/dL   HDL 40 >60 mg/dL   VLDL Cholesterol Cal 16 5 - 40 mg/dL   LDL Chol Calc (NIH) 72 0 - 99 mg/dL   Chol/HDL Ratio 3.2 0.0 - 5.0 ratio  CBC no Diff  Result Value Ref Range   WBC 5.8 3.4 - 10.8 x10E3/uL   RBC 4.87 4.14 - 5.80 x10E6/uL   Hemoglobin 14.0 13.0 - 17.7 g/dL   Hematocrit 58.0 62.4 - 51.0 %   MCV 86 79 - 97 fL   MCH 28.7 26.6 - 33.0  pg   MCHC 33.4 31.5 - 35.7 g/dL   RDW 87.2 88.3 - 84.5 %   Platelets 182 150 - 450 x10E3/uL  Vitamin B12  Result Value Ref Range   Vitamin B-12 195 (L) 232 - 1,245 pg/mL  Microalbumin / creatinine urine ratio  Result Value Ref Range   Creatinine, Urine 90.8 Not Estab. mg/dL   Microalbumin, Urine 35.8 Not Estab. ug/mL   Microalb/Creat Ratio 71 (H) 0 - 29 mg/g creat  Glucose, capillary  Result Value Ref Range   Glucose-Capillary 235 (H) 70 - 99 mg/dL  Urinalysis, Reflex Microscopic  Result Value Ref Range   Specific Gravity, UA 1.020 1.005 - 1.030   pH, UA 5.5 5.0 - 7.5   Color, UA Yellow Yellow   Appearance Ur Clear Clear   Leukocytes,UA Negative Negative   Protein,UA Trace Negative/Trace   Glucose, UA 2+ (A) Negative   Ketones, UA Negative Negative   RBC, UA Negative Negative   Bilirubin, UA Negative Negative   Urobilinogen, Ur 0.2 0.2 - 1.0 mg/dL   Nitrite, UA Negative Negative   Microscopic Examination Comment   POC Hbg A1C  Result Value Ref Range   Hemoglobin A1C     HbA1c POC (<> result, manual entry)     HbA1c, POC (prediabetic range)     HbA1c, POC (controlled diabetic range) 8.0 (A) 0.0 - 7.0 %  Urine cytology ancillary only  Result Value Ref Range   Neisseria Gonorrhea Negative    Chlamydia Negative    Comment Normal Reference Ranger Chlamydia - Negative    Comment      Normal Reference Range Neisseria Gonorrhea - Negative    The ASCVD Risk score (Arnett DK, et al., 2019) failed to calculate for the following reasons:   Risk score cannot be calculated because patient has a medical history suggesting prior/existing ASCVD  Assessment & Plan:  See Encounters Tab for problem based charting. 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  Reinfroced continued adherence to treatment plan and will have follow up with donna today to reinforce this  and eating patters.   Orders:   POC Hbg A1C   Comprehensive metabolic panel with GFR   Lipid Profile   CBC no Diff   Microalbumin / creatinine urine ratio  Essential hypertension BP elevated today but recent visits have been controlled. Attributed to missed dose  this morning.  Ask to monitor at home will recheck at next visit and if remains elevated will add additional agent.    B12 deficiency Recheck B12 levels, start oral supplementation, may have intermittent IM replacement Orders:   Vitamin B12   cyanocobalamin  (VITAMIN B12) injection 1,000 mcg  Benign localized prostatic hyperplasia with lower urinary tract symptoms (LUTS) Continue to have frequent LUTS symptoms.  They may be exacerbated by the jardiance , however he is on high dose flowmax.  He saw Urology- Dr Lovie at the beginning of this year, looks like plans were for close follow up but that did not happen.  I asked Hyrum to reach back out to Dr Evetta office for follow up to discuss next step management options.    Penile irritation Discussed jardiance  may lead to more yeast infection of groin but I see no evidence at current time.  May use over the counter clotrimazole .  Will also send STD screen. Orders:   Urine cytology ancillary only   Urinalysis, Reflex Microscopic  Encounter for immunization  Orders:   Flu vaccine HIGH DOSE PF(Fluzone Trivalent)    Medications Ordered Meds ordered this encounter  Medications   cyanocobalamin  (VITAMIN B12) injection 1,000 mcg   Other Orders Orders Placed This Encounter  Procedures   Flu vaccine HIGH DOSE PF(Fluzone Trivalent)   Comprehensive metabolic panel with GFR   Lipid Profile   CBC no Diff   Vitamin B12   Microalbumin / creatinine urine ratio   Glucose, capillary   Urinalysis, Reflex Microscopic   POC Hbg A1C   Follow Up: Return in about 3 months (around 09/14/2024) for DM, HTN. "

## 2024-06-15 LAB — CBC
Hematocrit: 41.9 % (ref 37.5–51.0)
Hemoglobin: 14 g/dL (ref 13.0–17.7)
MCH: 28.7 pg (ref 26.6–33.0)
MCHC: 33.4 g/dL (ref 31.5–35.7)
MCV: 86 fL (ref 79–97)
Platelets: 182 x10E3/uL (ref 150–450)
RBC: 4.87 x10E6/uL (ref 4.14–5.80)
RDW: 12.7 % (ref 11.6–15.4)
WBC: 5.8 x10E3/uL (ref 3.4–10.8)

## 2024-06-15 LAB — COMPREHENSIVE METABOLIC PANEL WITH GFR
ALT: 30 IU/L (ref 0–44)
AST: 24 IU/L (ref 0–40)
Albumin: 4.2 g/dL (ref 3.9–4.9)
Alkaline Phosphatase: 120 IU/L (ref 47–123)
BUN/Creatinine Ratio: 14 (ref 10–24)
BUN: 14 mg/dL (ref 8–27)
Bilirubin Total: 0.4 mg/dL (ref 0.0–1.2)
CO2: 21 mmol/L (ref 20–29)
Calcium: 9 mg/dL (ref 8.6–10.2)
Chloride: 105 mmol/L (ref 96–106)
Creatinine, Ser: 1.02 mg/dL (ref 0.76–1.27)
Globulin, Total: 2 g/dL (ref 1.5–4.5)
Glucose: 227 mg/dL — ABNORMAL HIGH (ref 70–99)
Potassium: 4.2 mmol/L (ref 3.5–5.2)
Sodium: 137 mmol/L (ref 134–144)
Total Protein: 6.2 g/dL (ref 6.0–8.5)
eGFR: 80 mL/min/1.73 (ref >59)

## 2024-06-15 LAB — URINALYSIS, ROUTINE W REFLEX MICROSCOPIC
Bilirubin, UA: NEGATIVE
Ketones, UA: NEGATIVE
Leukocytes,UA: NEGATIVE
Nitrite, UA: NEGATIVE
RBC, UA: NEGATIVE
Specific Gravity, UA: 1.02 (ref 1.005–1.030)
Urobilinogen, Ur: 0.2 mg/dL (ref 0.2–1.0)
pH, UA: 5.5 (ref 5.0–7.5)

## 2024-06-15 LAB — LIPID PANEL
Chol/HDL Ratio: 3.2 ratio (ref 0.0–5.0)
Cholesterol, Total: 128 mg/dL (ref 100–199)
HDL: 40 mg/dL (ref >39)
LDL Chol Calc (NIH): 72 mg/dL (ref 0–99)
Triglycerides: 83 mg/dL (ref 0–149)
VLDL Cholesterol Cal: 16 mg/dL (ref 5–40)

## 2024-06-15 LAB — URINE CYTOLOGY ANCILLARY ONLY
Chlamydia: NEGATIVE
Comment: NEGATIVE
Comment: NORMAL
Neisseria Gonorrhea: NEGATIVE

## 2024-06-15 LAB — VITAMIN B12: Vitamin B-12: 195 pg/mL — ABNORMAL LOW (ref 232–1245)

## 2024-06-15 LAB — MICROALBUMIN / CREATININE URINE RATIO
Creatinine, Urine: 90.8 mg/dL
Microalb/Creat Ratio: 71 mg/g{creat} — ABNORMAL HIGH (ref 0–29)
Microalbumin, Urine: 64.1 ug/mL

## 2024-06-16 ENCOUNTER — Ambulatory Visit: Payer: Self-pay | Admitting: Internal Medicine

## 2024-06-17 ENCOUNTER — Ambulatory Visit: Admitting: Podiatry

## 2024-06-29 ENCOUNTER — Ambulatory Visit: Admitting: Podiatry

## 2024-07-01 ENCOUNTER — Other Ambulatory Visit: Payer: Self-pay | Admitting: Internal Medicine

## 2024-07-01 ENCOUNTER — Other Ambulatory Visit: Payer: Self-pay

## 2024-07-01 MED ORDER — FREESTYLE LIBRE 3 SENSOR MISC
1.0000 | Freq: Every day | 1 refills | Status: AC
  Filled 2024-07-30: qty 6, 84d supply, fill #0

## 2024-07-02 DIAGNOSIS — R399 Unspecified symptoms and signs involving the genitourinary system: Secondary | ICD-10-CM | POA: Diagnosis not present

## 2024-07-02 DIAGNOSIS — N401 Enlarged prostate with lower urinary tract symptoms: Secondary | ICD-10-CM | POA: Diagnosis not present

## 2024-07-02 DIAGNOSIS — N5201 Erectile dysfunction due to arterial insufficiency: Secondary | ICD-10-CM | POA: Diagnosis not present

## 2024-07-02 DIAGNOSIS — R3911 Hesitancy of micturition: Secondary | ICD-10-CM | POA: Diagnosis not present

## 2024-07-06 ENCOUNTER — Other Ambulatory Visit: Payer: Self-pay

## 2024-07-06 ENCOUNTER — Telehealth: Payer: Self-pay | Admitting: *Deleted

## 2024-07-06 DIAGNOSIS — E1143 Type 2 diabetes mellitus with diabetic autonomic (poly)neuropathy: Secondary | ICD-10-CM

## 2024-07-06 DIAGNOSIS — H1045 Other chronic allergic conjunctivitis: Secondary | ICD-10-CM | POA: Diagnosis not present

## 2024-07-06 DIAGNOSIS — H40053 Ocular hypertension, bilateral: Secondary | ICD-10-CM | POA: Diagnosis not present

## 2024-07-06 DIAGNOSIS — Z961 Presence of intraocular lens: Secondary | ICD-10-CM | POA: Diagnosis not present

## 2024-07-06 DIAGNOSIS — H04123 Dry eye syndrome of bilateral lacrimal glands: Secondary | ICD-10-CM | POA: Diagnosis not present

## 2024-07-06 MED ORDER — OMNIPOD DASH PDM (GEN 4) KIT: PACK | 1 refills | Status: AC

## 2024-07-06 NOTE — Telephone Encounter (Signed)
 Call from pt He's currently at Dr Milford office, getting his eyes checked Wants to speak to Arland regarding issues with omnipod Arland not avail at this time. Pt instructed to call back after his appt and CMA will let Arland know he called.

## 2024-07-06 NOTE — Telephone Encounter (Signed)
 Patient's Omnipod controller died. He requests a prescription for a new one

## 2024-07-07 ENCOUNTER — Encounter: Payer: Self-pay | Admitting: Podiatry

## 2024-07-07 ENCOUNTER — Ambulatory Visit: Admitting: Podiatry

## 2024-07-07 ENCOUNTER — Telehealth: Payer: Self-pay | Admitting: Dietician

## 2024-07-07 DIAGNOSIS — B351 Tinea unguium: Secondary | ICD-10-CM

## 2024-07-07 DIAGNOSIS — M79675 Pain in left toe(s): Secondary | ICD-10-CM

## 2024-07-07 DIAGNOSIS — N5201 Erectile dysfunction due to arterial insufficiency: Secondary | ICD-10-CM | POA: Diagnosis not present

## 2024-07-07 DIAGNOSIS — N401 Enlarged prostate with lower urinary tract symptoms: Secondary | ICD-10-CM | POA: Diagnosis not present

## 2024-07-07 DIAGNOSIS — M79674 Pain in right toe(s): Secondary | ICD-10-CM | POA: Diagnosis not present

## 2024-07-07 DIAGNOSIS — E1143 Type 2 diabetes mellitus with diabetic autonomic (poly)neuropathy: Secondary | ICD-10-CM

## 2024-07-07 NOTE — Progress Notes (Signed)
This patient presents to the office with chief complaint of long thick painful nails.  Patient says the nails are painful walking and wearing shoes.  This patient is unable to self treat.  This patient is unable to trim his nails since he is unable to reach his  nails.  She presents to the office for preventative foot care services.  General Appearance  Alert, conversant and in no acute stress.  Vascular  Dorsalis pedis and posterior tibial  pulses are palpable  bilaterally.  Capillary return is within normal limits  bilaterally. Temperature is within normal limits  bilaterally.  Neurologic  Senn-Weinstein monofilament wire test within normal limits  bilaterally. Muscle power within normal limits bilaterally.  Nails Thick disfigured discolored nails with subungual debris  from hallux to fifth toes bilaterally. No evidence of bacterial infection or drainage bilaterally.  Orthopedic  No limitations of motion  feet .  No crepitus or effusions noted.  No bony pathology or digital deformities noted.  Skin  normotropic skin with no porokeratosis noted bilaterally.  No signs of infections or ulcers noted.     Onychomycosis  Nails  B/L.  Pain in right toes  Pain in left toes  Debridement of nails both feet followed trimming the nails with dremel tool.    RTC 3 months.   Gardiner Barefoot DPM

## 2024-07-07 NOTE — Telephone Encounter (Signed)
 Call to follow up on Tyler Wilkinson getting a replacement of his insulin  pump controller.Left voicemail for return call

## 2024-07-09 ENCOUNTER — Other Ambulatory Visit: Payer: Self-pay

## 2024-07-12 ENCOUNTER — Other Ambulatory Visit: Payer: Self-pay

## 2024-07-21 ENCOUNTER — Encounter

## 2024-07-21 NOTE — Progress Notes (Unsigned)
 [phone]

## 2024-07-29 ENCOUNTER — Other Ambulatory Visit

## 2024-07-29 ENCOUNTER — Other Ambulatory Visit: Payer: Self-pay

## 2024-07-29 DIAGNOSIS — N5201 Erectile dysfunction due to arterial insufficiency: Secondary | ICD-10-CM | POA: Diagnosis not present

## 2024-07-29 DIAGNOSIS — E291 Testicular hypofunction: Secondary | ICD-10-CM | POA: Diagnosis not present

## 2024-07-29 DIAGNOSIS — N401 Enlarged prostate with lower urinary tract symptoms: Secondary | ICD-10-CM | POA: Diagnosis not present

## 2024-07-29 MED ORDER — MIRABEGRON ER 25 MG PO TB24
25.0000 mg | ORAL_TABLET | Freq: Every day | ORAL | 5 refills | Status: AC
  Filled 2024-07-29: qty 30, 30d supply, fill #0
  Filled 2024-09-17: qty 30, 30d supply, fill #1

## 2024-07-30 ENCOUNTER — Telehealth: Payer: Self-pay

## 2024-07-30 ENCOUNTER — Other Ambulatory Visit: Payer: Self-pay

## 2024-07-30 ENCOUNTER — Other Ambulatory Visit (HOSPITAL_COMMUNITY): Payer: Self-pay

## 2024-07-30 NOTE — Telephone Encounter (Signed)
 PA rec'd - Key [id number]  Awaiting clinical questions

## 2024-07-30 NOTE — Telephone Encounter (Signed)
 Pharmacy Patient Advocate Encounter   Received notification from Patient Pharmacy that prior authorization for TRULICITY  3MG  is required/requested.   Insurance verification completed.   The patient is insured through [facility] AD[employer]/RX ADVANCE.  PA required; PA submitted to above mentioned insurance via Latent Key/confirmation #/EOC [id number] Status is pending

## 2024-08-02 ENCOUNTER — Other Ambulatory Visit: Payer: Self-pay

## 2024-08-02 NOTE — Telephone Encounter (Signed)
 Pharmacy Patient Advocate Encounter  Received notification from [facility] [employer] that Prior Authorization for TRULICITY  3MG  has been APPROVED from 07/30/24 to 07/30/25   PA #/Case ID/Reference #: [id number]

## 2024-08-17 ENCOUNTER — Other Ambulatory Visit: Payer: Self-pay

## 2024-08-27 ENCOUNTER — Other Ambulatory Visit: Payer: Self-pay

## 2024-09-17 ENCOUNTER — Other Ambulatory Visit: Payer: Self-pay

## 2024-09-20 ENCOUNTER — Other Ambulatory Visit: Payer: Self-pay

## 2024-10-07 ENCOUNTER — Ambulatory Visit: Admitting: Podiatry

## 2024-10-11 ENCOUNTER — Ambulatory Visit: Payer: Self-pay | Admitting: Internal Medicine
